# Patient Record
Sex: Female | Born: 1935 | Race: White | Hispanic: No | Smoking: Never smoker
Health system: Southern US, Community
[De-identification: ages and names within clinical notes are randomized; demographics above are authoritative.]

## PROBLEM LIST (undated history)

## (undated) DIAGNOSIS — M1990 Unspecified osteoarthritis, unspecified site: Secondary | ICD-10-CM

## (undated) DIAGNOSIS — I639 Cerebral infarction, unspecified: Secondary | ICD-10-CM

## (undated) DIAGNOSIS — N390 Urinary tract infection, site not specified: Secondary | ICD-10-CM

## (undated) DIAGNOSIS — E785 Hyperlipidemia, unspecified: Secondary | ICD-10-CM

## (undated) DIAGNOSIS — H3530 Unspecified macular degeneration: Secondary | ICD-10-CM

## (undated) DIAGNOSIS — R0600 Dyspnea, unspecified: Secondary | ICD-10-CM

## (undated) DIAGNOSIS — IMO0001 Reserved for inherently not codable concepts without codable children: Secondary | ICD-10-CM

## (undated) DIAGNOSIS — E039 Hypothyroidism, unspecified: Secondary | ICD-10-CM

## (undated) DIAGNOSIS — Z5189 Encounter for other specified aftercare: Secondary | ICD-10-CM

## (undated) DIAGNOSIS — I10 Essential (primary) hypertension: Secondary | ICD-10-CM

## (undated) DIAGNOSIS — C801 Malignant (primary) neoplasm, unspecified: Secondary | ICD-10-CM

## (undated) HISTORY — PX: OTHER SURGICAL HISTORY: SHX169

## (undated) HISTORY — DX: Malignant (primary) neoplasm, unspecified: C80.1

## (undated) HISTORY — DX: Unspecified osteoarthritis, unspecified site: M19.90

## (undated) HISTORY — DX: Hyperlipidemia, unspecified: E78.5

## (undated) HISTORY — DX: Essential (primary) hypertension: I10

## (undated) HISTORY — PX: TONSILLECTOMY: SUR1361

## (undated) HISTORY — DX: Hypothyroidism, unspecified: E03.9

---
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
## 2002-11-27 ENCOUNTER — Emergency Department (HOSPITAL_COMMUNITY): Admission: EM | Admit: 2002-11-27 | Discharge: 2002-11-28 | Payer: Self-pay | Admitting: Emergency Medicine

## 2002-11-27 ENCOUNTER — Encounter: Payer: Self-pay | Admitting: Emergency Medicine

## 2003-06-09 DIAGNOSIS — Z9189 Other specified personal risk factors, not elsewhere classified: Secondary | ICD-10-CM | POA: Insufficient documentation

## 2003-07-09 ENCOUNTER — Ambulatory Visit (HOSPITAL_COMMUNITY): Admission: RE | Admit: 2003-07-09 | Discharge: 2003-07-09 | Payer: Self-pay | Admitting: Gastroenterology

## 2003-07-09 ENCOUNTER — Encounter (INDEPENDENT_AMBULATORY_CARE_PROVIDER_SITE_OTHER): Payer: Self-pay | Admitting: Specialist

## 2005-02-06 ENCOUNTER — Encounter: Payer: Self-pay | Admitting: Family Medicine

## 2005-06-15 ENCOUNTER — Ambulatory Visit: Payer: Self-pay | Admitting: Family Medicine

## 2005-08-16 ENCOUNTER — Ambulatory Visit: Payer: Self-pay | Admitting: Family Medicine

## 2005-08-21 ENCOUNTER — Ambulatory Visit: Payer: Self-pay | Admitting: Family Medicine

## 2005-10-03 ENCOUNTER — Ambulatory Visit: Payer: Self-pay | Admitting: Family Medicine

## 2005-11-21 ENCOUNTER — Ambulatory Visit: Payer: Self-pay | Admitting: Family Medicine

## 2005-12-12 ENCOUNTER — Ambulatory Visit: Payer: Self-pay | Admitting: Family Medicine

## 2006-03-13 ENCOUNTER — Ambulatory Visit: Payer: Self-pay | Admitting: Family Medicine

## 2006-03-19 ENCOUNTER — Ambulatory Visit: Payer: Self-pay | Admitting: Family Medicine

## 2006-04-16 ENCOUNTER — Ambulatory Visit: Payer: Self-pay | Admitting: Family Medicine

## 2006-07-11 ENCOUNTER — Ambulatory Visit: Payer: Self-pay | Admitting: Family Medicine

## 2006-07-11 LAB — CONVERTED CEMR LAB: Hgb A1c MFr Bld: 6.9 %

## 2006-09-18 ENCOUNTER — Ambulatory Visit: Payer: Self-pay | Admitting: Family Medicine

## 2006-09-24 ENCOUNTER — Encounter: Payer: Self-pay | Admitting: Family Medicine

## 2006-09-24 DIAGNOSIS — I10 Essential (primary) hypertension: Secondary | ICD-10-CM | POA: Insufficient documentation

## 2006-09-24 DIAGNOSIS — IMO0002 Reserved for concepts with insufficient information to code with codable children: Secondary | ICD-10-CM | POA: Insufficient documentation

## 2006-09-24 DIAGNOSIS — E669 Obesity, unspecified: Secondary | ICD-10-CM

## 2006-09-24 DIAGNOSIS — E1149 Type 2 diabetes mellitus with other diabetic neurological complication: Secondary | ICD-10-CM | POA: Insufficient documentation

## 2006-09-24 DIAGNOSIS — N3941 Urge incontinence: Secondary | ICD-10-CM

## 2006-09-24 DIAGNOSIS — E785 Hyperlipidemia, unspecified: Secondary | ICD-10-CM

## 2006-09-24 DIAGNOSIS — L719 Rosacea, unspecified: Secondary | ICD-10-CM

## 2006-09-24 DIAGNOSIS — E1165 Type 2 diabetes mellitus with hyperglycemia: Secondary | ICD-10-CM

## 2006-09-24 DIAGNOSIS — E1140 Type 2 diabetes mellitus with diabetic neuropathy, unspecified: Secondary | ICD-10-CM

## 2006-11-08 ENCOUNTER — Encounter: Payer: Self-pay | Admitting: Family Medicine

## 2006-11-13 ENCOUNTER — Ambulatory Visit: Payer: Self-pay | Admitting: Family Medicine

## 2006-11-14 LAB — CONVERTED CEMR LAB
ALT: 17 units/L (ref 0–40)
Albumin: 3.3 g/dL — ABNORMAL LOW (ref 3.5–5.2)
BUN: 28 mg/dL — ABNORMAL HIGH (ref 6–23)
CO2: 29 meq/L (ref 19–32)
Calcium: 9.1 mg/dL (ref 8.4–10.5)
Cholesterol: 150 mg/dL (ref 0–200)
GFR calc non Af Amer: 65 mL/min
Hgb A1c MFr Bld: 8.2 % — ABNORMAL HIGH (ref 4.6–6.0)
LDL Cholesterol: 86 mg/dL (ref 0–99)
Potassium: 4.5 meq/L (ref 3.5–5.1)

## 2006-11-20 ENCOUNTER — Encounter (INDEPENDENT_AMBULATORY_CARE_PROVIDER_SITE_OTHER): Payer: Self-pay | Admitting: *Deleted

## 2006-12-11 ENCOUNTER — Ambulatory Visit: Payer: Self-pay | Admitting: Family Medicine

## 2007-02-12 ENCOUNTER — Ambulatory Visit: Payer: Self-pay | Admitting: Family Medicine

## 2007-02-14 LAB — CONVERTED CEMR LAB: ALT: 18 units/L (ref 0–35)

## 2007-03-04 ENCOUNTER — Encounter: Payer: Self-pay | Admitting: Family Medicine

## 2007-03-05 ENCOUNTER — Encounter: Payer: Self-pay | Admitting: Family Medicine

## 2007-05-14 ENCOUNTER — Ambulatory Visit: Payer: Self-pay | Admitting: Family Medicine

## 2007-05-15 LAB — CONVERTED CEMR LAB
Albumin: 3.6 g/dL (ref 3.5–5.2)
GFR calc Af Amer: 62 mL/min
GFR calc non Af Amer: 51 mL/min
Glucose, Bld: 125 mg/dL — ABNORMAL HIGH (ref 70–99)
LDL Cholesterol: 93 mg/dL (ref 0–99)
Phosphorus: 4 mg/dL (ref 2.3–4.6)
Potassium: 4.3 meq/L (ref 3.5–5.1)
Sodium: 144 meq/L (ref 135–145)
Total CHOL/HDL Ratio: 4.5
Triglycerides: 139 mg/dL (ref 0–149)
VLDL: 28 mg/dL (ref 0–40)

## 2007-06-04 ENCOUNTER — Encounter: Payer: Self-pay | Admitting: Family Medicine

## 2007-06-09 HISTORY — PX: MOHS SURGERY: SUR867

## 2007-07-01 DIAGNOSIS — C44309 Unspecified malignant neoplasm of skin of other parts of face: Secondary | ICD-10-CM | POA: Insufficient documentation

## 2007-07-01 DIAGNOSIS — C44300 Unspecified malignant neoplasm of skin of unspecified part of face: Secondary | ICD-10-CM | POA: Insufficient documentation

## 2007-08-13 ENCOUNTER — Ambulatory Visit: Payer: Self-pay | Admitting: Family Medicine

## 2007-08-13 DIAGNOSIS — L84 Corns and callosities: Secondary | ICD-10-CM

## 2007-08-14 ENCOUNTER — Encounter: Payer: Self-pay | Admitting: Family Medicine

## 2007-08-14 LAB — CONVERTED CEMR LAB
ALT: 16 units/L (ref 0–35)
AST: 24 units/L (ref 0–37)

## 2007-09-10 ENCOUNTER — Ambulatory Visit: Payer: Self-pay | Admitting: Family Medicine

## 2007-09-15 ENCOUNTER — Telehealth: Payer: Self-pay | Admitting: Family Medicine

## 2007-12-10 ENCOUNTER — Ambulatory Visit: Payer: Self-pay | Admitting: Family Medicine

## 2007-12-11 ENCOUNTER — Telehealth: Payer: Self-pay | Admitting: Family Medicine

## 2007-12-12 LAB — CONVERTED CEMR LAB
Albumin: 3.5 g/dL (ref 3.5–5.2)
Alkaline Phosphatase: 50 units/L (ref 39–117)
Basophils Absolute: 0 10*3/uL (ref 0.0–0.1)
Basophils Relative: 0.8 % (ref 0.0–1.0)
Bilirubin, Direct: 0.1 mg/dL (ref 0.0–0.3)
CO2: 29 meq/L (ref 19–32)
Chloride: 115 meq/L — ABNORMAL HIGH (ref 96–112)
Cholesterol: 133 mg/dL (ref 0–200)
Creatinine, Ser: 1.1 mg/dL (ref 0.4–1.2)
Eosinophils Absolute: 0.1 10*3/uL (ref 0.0–0.7)
Eosinophils Relative: 2 % (ref 0.0–5.0)
GFR calc Af Amer: 62 mL/min
GFR calc non Af Amer: 51 mL/min
HCT: 37.3 % (ref 36.0–46.0)
HDL: 31.4 mg/dL — ABNORMAL LOW (ref >39.0)
MCHC: 34.8 g/dL (ref 30.0–36.0)
MCV: 92.7 fL (ref 78.0–100.0)
Monocytes Absolute: 0.5 10*3/uL (ref 0.1–1.0)
Neutrophils Relative %: 46.4 % (ref 43.0–77.0)
Platelets: 167 10*3/uL (ref 150–400)
Potassium: 4.1 meq/L (ref 3.5–5.1)
TSH: 1.05 microintl units/mL (ref 0.35–5.50)
Total Protein: 6.8 g/dL (ref 6.0–8.3)
VLDL: 17 mg/dL (ref 0–40)

## 2008-02-11 ENCOUNTER — Encounter (INDEPENDENT_AMBULATORY_CARE_PROVIDER_SITE_OTHER): Payer: Self-pay | Admitting: *Deleted

## 2008-03-10 ENCOUNTER — Ambulatory Visit: Payer: Self-pay | Admitting: Family Medicine

## 2008-03-10 ENCOUNTER — Telehealth: Payer: Self-pay | Admitting: Family Medicine

## 2008-04-19 ENCOUNTER — Encounter: Payer: Self-pay | Admitting: Family Medicine

## 2008-04-23 ENCOUNTER — Telehealth (INDEPENDENT_AMBULATORY_CARE_PROVIDER_SITE_OTHER): Payer: Self-pay | Admitting: *Deleted

## 2008-05-26 ENCOUNTER — Ambulatory Visit: Payer: Self-pay | Admitting: Family Medicine

## 2008-05-27 LAB — CONVERTED CEMR LAB
ALT: 15 units/L (ref 0–35)
AST: 23 units/L (ref 0–37)
BUN: 24 mg/dL — ABNORMAL HIGH (ref 6–23)
CO2: 30 meq/L (ref 19–32)
Chloride: 112 meq/L (ref 96–112)
Cholesterol: 120 mg/dL (ref 0–200)
Creatinine, Ser: 1 mg/dL (ref 0.4–1.2)
Creatinine,U: 86.6 mg/dL
Glucose, Bld: 131 mg/dL — ABNORMAL HIGH (ref 70–99)
Hgb A1c MFr Bld: 6.7 % — ABNORMAL HIGH (ref 4.6–6.0)
Microalb Creat Ratio: 11.5 mg/g (ref 0.0–30.0)
Microalb, Ur: 1 mg/dL (ref 0.0–1.9)
Potassium: 4.2 meq/L (ref 3.5–5.1)
Triglycerides: 71 mg/dL (ref 0–149)

## 2008-06-02 ENCOUNTER — Ambulatory Visit: Payer: Self-pay | Admitting: Family Medicine

## 2008-06-02 LAB — CONVERTED CEMR LAB
Ketones, urine, test strip: NEGATIVE
RBC / HPF: 0
Urobilinogen, UA: 0.2
WBC, UA: 0 cells/hpf

## 2008-07-09 HISTORY — PX: ANKLE FRACTURE SURGERY: SHX122

## 2008-07-09 HISTORY — PX: JOINT REPLACEMENT: SHX530

## 2008-12-02 ENCOUNTER — Ambulatory Visit: Payer: Self-pay | Admitting: Family Medicine

## 2008-12-03 LAB — CONVERTED CEMR LAB
AST: 24 units/L (ref 0–37)
Albumin: 3.3 g/dL — ABNORMAL LOW (ref 3.5–5.2)
BUN: 22 mg/dL (ref 6–23)
Calcium: 8.8 mg/dL (ref 8.4–10.5)
Chloride: 110 meq/L (ref 96–112)
Cholesterol: 123 mg/dL (ref 0–200)
HDL: 32 mg/dL — ABNORMAL LOW (ref >39.00)
LDL Cholesterol: 67 mg/dL (ref 0–99)
Phosphorus: 3.9 mg/dL (ref 2.3–4.6)
VLDL: 23.8 mg/dL (ref 0.0–40.0)

## 2008-12-08 ENCOUNTER — Encounter: Admission: RE | Admit: 2008-12-08 | Discharge: 2008-12-08 | Payer: Self-pay | Admitting: Family Medicine

## 2008-12-08 ENCOUNTER — Ambulatory Visit: Payer: Self-pay | Admitting: Family Medicine

## 2008-12-08 DIAGNOSIS — N951 Menopausal and female climacteric states: Secondary | ICD-10-CM | POA: Insufficient documentation

## 2008-12-21 ENCOUNTER — Encounter: Admission: RE | Admit: 2008-12-21 | Discharge: 2008-12-21 | Payer: Self-pay | Admitting: Orthopedic Surgery

## 2008-12-24 ENCOUNTER — Encounter (INDEPENDENT_AMBULATORY_CARE_PROVIDER_SITE_OTHER): Payer: Self-pay | Admitting: *Deleted

## 2009-01-07 ENCOUNTER — Telehealth: Payer: Self-pay | Admitting: Family Medicine

## 2009-01-12 ENCOUNTER — Encounter: Payer: Self-pay | Admitting: Family Medicine

## 2009-01-25 ENCOUNTER — Encounter: Admission: RE | Admit: 2009-01-25 | Discharge: 2009-01-25 | Payer: Self-pay | Admitting: Orthopedic Surgery

## 2009-01-27 ENCOUNTER — Encounter: Payer: Self-pay | Admitting: Family Medicine

## 2009-03-15 ENCOUNTER — Encounter: Payer: Self-pay | Admitting: Family Medicine

## 2009-03-16 ENCOUNTER — Ambulatory Visit: Payer: Self-pay | Admitting: Family Medicine

## 2009-03-16 DIAGNOSIS — M169 Osteoarthritis of hip, unspecified: Secondary | ICD-10-CM | POA: Insufficient documentation

## 2009-03-16 DIAGNOSIS — M1610 Unilateral primary osteoarthritis, unspecified hip: Secondary | ICD-10-CM | POA: Insufficient documentation

## 2009-04-13 ENCOUNTER — Inpatient Hospital Stay (HOSPITAL_COMMUNITY): Admission: RE | Admit: 2009-04-13 | Discharge: 2009-04-16 | Payer: Self-pay | Admitting: Orthopedic Surgery

## 2009-04-29 ENCOUNTER — Encounter: Payer: Self-pay | Admitting: Family Medicine

## 2009-04-29 ENCOUNTER — Telehealth: Payer: Self-pay | Admitting: Family Medicine

## 2009-04-29 LAB — CONVERTED CEMR LAB
Bacteria, UA: 0
Casts: 0 /lpf
Nitrite: NEGATIVE
RBC / HPF: 0
Specific Gravity, Urine: 1.01
Urine crystals, microscopic: 0 /hpf
Urobilinogen, UA: 0.2
WBC Urine, dipstick: NEGATIVE

## 2009-04-30 ENCOUNTER — Encounter: Payer: Self-pay | Admitting: Family Medicine

## 2009-06-09 ENCOUNTER — Ambulatory Visit: Payer: Self-pay | Admitting: Family Medicine

## 2009-06-10 LAB — CONVERTED CEMR LAB
ALT: 13 units/L (ref 0–35)
AST: 22 units/L (ref 0–37)
BUN: 23 mg/dL (ref 6–23)
CO2: 28 meq/L (ref 19–32)
Calcium: 9.2 mg/dL (ref 8.4–10.5)
Chloride: 110 meq/L (ref 96–112)
Creatinine, Ser: 1 mg/dL (ref 0.4–1.2)
GFR calc non Af Amer: 56.98 mL/min (ref >60)
Hgb A1c MFr Bld: 5.9 % (ref 4.6–6.5)
LDL Cholesterol: 84 mg/dL (ref 0–99)
Microalb, Ur: 0.6 mg/dL (ref 0.0–1.9)
Total CHOL/HDL Ratio: 4

## 2009-07-06 ENCOUNTER — Emergency Department (HOSPITAL_COMMUNITY): Admission: EM | Admit: 2009-07-06 | Discharge: 2009-07-06 | Payer: Self-pay | Admitting: Emergency Medicine

## 2009-07-08 ENCOUNTER — Ambulatory Visit (HOSPITAL_COMMUNITY): Admission: RE | Admit: 2009-07-08 | Discharge: 2009-07-08 | Payer: Self-pay | Admitting: Orthopedic Surgery

## 2009-07-21 ENCOUNTER — Encounter: Payer: Self-pay | Admitting: Family Medicine

## 2009-08-12 ENCOUNTER — Ambulatory Visit: Payer: Self-pay | Admitting: Family Medicine

## 2009-08-18 ENCOUNTER — Encounter: Payer: Self-pay | Admitting: Family Medicine

## 2010-01-04 ENCOUNTER — Encounter: Payer: Self-pay | Admitting: Family Medicine

## 2010-02-09 ENCOUNTER — Ambulatory Visit: Payer: Self-pay | Admitting: Family Medicine

## 2010-02-09 LAB — CONVERTED CEMR LAB
ALT: 15 units/L (ref 0–35)
AST: 25 units/L (ref 0–37)
Albumin: 3.3 g/dL — ABNORMAL LOW (ref 3.5–5.2)
BUN: 27 mg/dL — ABNORMAL HIGH (ref 6–23)
CO2: 26 meq/L (ref 19–32)
Chloride: 109 meq/L (ref 96–112)
Creatinine, Ser: 1 mg/dL (ref 0.4–1.2)
GFR calc non Af Amer: 54.36 mL/min (ref >60)
HDL: 32 mg/dL — ABNORMAL LOW (ref >39.00)
LDL Cholesterol: 74 mg/dL (ref 0–99)
VLDL: 19.8 mg/dL (ref 0.0–40.0)

## 2010-02-15 ENCOUNTER — Ambulatory Visit: Payer: Self-pay | Admitting: Family Medicine

## 2010-02-22 ENCOUNTER — Encounter: Payer: Self-pay | Admitting: Family Medicine

## 2010-02-27 ENCOUNTER — Encounter: Payer: Self-pay | Admitting: Family Medicine

## 2010-03-30 ENCOUNTER — Telehealth: Payer: Self-pay | Admitting: Family Medicine

## 2010-04-06 ENCOUNTER — Encounter: Payer: Self-pay | Admitting: Family Medicine

## 2010-04-07 ENCOUNTER — Telehealth: Payer: Self-pay | Admitting: Family Medicine

## 2010-08-08 NOTE — Letter (Signed)
Summary: [facility]   Imported By: Lanelle Bal 01/13/2010 11:02:22  _____________________________________________________________________  External Attachment:    Type:   Image     Comment:   External Document  Appended Document: [facility]    Clinical Lists Changes  Observations: Added new observation of DMEYEEXAMNXT: 01/2011 (01/14/2010 22:48) Added new observation of DMEYEEXMRES: normal (01/04/2010 22:48) Added new observation of EYE EXAM BY: Dr Elmer Picker (01/04/2010 22:48) Added new observation of DIAB EYE EX: normal (01/04/2010 22:48)        Diabetes Management Exam:    Eye Exam:       Eye Exam done elsewhere          Date: 01/04/2010          Results: normal          Done by: Dr Elmer Picker

## 2010-08-08 NOTE — Letter (Signed)
Summary: CMN for Diabetes Supplies/[facility]  CMN for Diabetes Supplies/[facility]   Imported By: Lanelle Bal 03/06/2010 09:22:47  _____________________________________________________________________  External Attachment:    Type:   Image     Comment:   External Document

## 2010-08-08 NOTE — Letter (Signed)
Summary: [name]  [name]   Imported By: Lanelle Bal 08/23/2009 08:34:03  _____________________________________________________________________  External Attachment:    Type:   Image     Comment:   External Document

## 2010-08-08 NOTE — Assessment & Plan Note (Signed)
Summary: 6 MTH FU/CLE   Vital Signs:  Patient profile:   77 year old female Height:      61 inches Weight:      193.25 pounds BMI:     36.65 Temp:     97.3 degrees F oral Pulse rate:   64 / minute Pulse rhythm:   regular BP sitting:   140 / 72  (left arm) Cuff size:   large  Vitals Entered By: Lewanda Rife LPN (February 15, 2010 9:01 AM)  Serial Vital Signs/Assessments:  Time      Position  BP       Pulse  Resp  Temp     By                     132/72                         Judith Part MD  CC: six month f/u   History of Present Illness: here for f/u of HTN and DM and lipids  doing pretty good for the most part   bp is up a bit on first check 140/72  DM is worse with AIC of 6.8 from 5.9- but still well controlled has been under a lot of stress recently -- lost 4 friends and has one who is sick -- with a brain tumor  is eating the same  check sugars am / and pm-- and up a bit -- 130s in am and nt 100  opthy up to date   lipids better with LDL 74-- great   thyroid was stable in dec   had lost and then gained some wt (after she broke her leg)  can do a little exercise - rides bike  hard to walk with hip   does not think she needs grief counseling   Allergies: 1)  ! Metformin Hcl  Past History:  Past Medical History: Last updated: 07/01/2007 Diabetes, Type 2 Hyperlipidemia Hypertension Hypothyroidism Osteoarthritis basal cell skin cancer  Past Surgical History: Last updated: 07/01/2007 T A H and B S O Tonsillectomy 12/08 basal cell skin cancer L temple- MOHS proceedure  Family History: Last updated: 09/24/2006 Father: RA, CAD,HTN, DM Mother:  Siblings: sister- ra 3 brothers-DM 3 brothers-CAD,HTN sister- DM, CAD  Social History: Last updated: 09/24/2006 Marital Status: widowed Children: 3 Occupation:  Patient has never smoked.  Alcohol Use - no Regular Exercise - yes  Risk Factors: Exercise: yes (09/24/2006)  Risk Factors: Smoking  Status: never (09/24/2006)  Review of Systems General:  Denies fatigue, loss of appetite, and malaise. Eyes:  Denies blurring and eye irritation. CV:  Denies chest pain or discomfort, palpitations, and shortness of breath with exertion. Resp:  Denies cough, shortness of breath, and wheezing. GI:  Denies abdominal pain, change in bowel habits, indigestion, and nausea. GU:  Denies urinary frequency. MS:  Denies joint pain, joint redness, and joint swelling. Derm:  Denies itching, lesion(s), poor wound healing, and rash. Neuro:  Denies numbness and tingling. Endo:  Denies cold intolerance, excessive thirst, excessive urination, and heat intolerance. Heme:  Denies abnormal bruising and bleeding.  Physical Exam  General:  overweight but generally well appearing  Head:  normocephalic, atraumatic, and no abnormalities observed.   Eyes:  vision grossly intact, pupils equal, pupils round, and pupils reactive to light.   Mouth:  pharynx pink and moist.   Neck:  supple with full rom and no masses or thyromegally,  no JVD or carotid bruit  Lungs:  Normal respiratory effort, chest expands symmetrically. Lungs are clear to auscultation, no crackles or wheezes. Heart:  Normal rate and regular rhythm. S1 and S2 normal without gallop, murmur, click, rub or other extra sounds. Abdomen:  Bowel sounds positive,abdomen soft and non-tender without masses, organomegaly or hernias noted. no renal bruits  Msk:  No deformity or scoliosis noted of thoracic or lumbar spine.   Pulses:  R and L carotid,radial,femoral,dorsalis pedis and posterior tibial pulses are full and equal bilaterally Extremities:  No clubbing, cyanosis, edema, or deformity noted with normal full range of motion of all joints.   Neurologic:  sensation intact to light touch, gait normal, and DTRs symmetrical and normal.   Skin:  Intact without suspicious lesions or rashes Cervical Nodes:  No lymphadenopathy noted Inguinal Nodes:  No significant  adenopathy Psych:  seems anxious today good eye contact and insight   Diabetes Management Exam:    Foot Exam (with socks and/or shoes not present):       Sensory-Pinprick/Light touch:          Left medial foot (L-4): normal          Left dorsal foot (L-5): normal          Left lateral foot (S-1): normal          Right medial foot (L-4): normal          Right dorsal foot (L-5): normal          Right lateral foot (S-1): normal       Sensory-Monofilament:          Left foot: normal          Right foot: normal       Inspection:          Left foot: normal          Right foot: normal       Nails:          Left foot: normal          Right foot: normal   Impression & Recommendations:  Problem # 1:  HYPERTENSION (ICD-401.9) Assessment Unchanged  this is fair - better on 2nd check  pt is stressed today no med changes need for wt loss disc  f/u 6 mo lab reviewed in detail Her updated medication list for this problem includes:    Metoprolol Succinate 100 Mg Xr24h-tab (Metoprolol succinate) .[name]... 1 by mouth once daily    Mavik 4 Mg Tabs (Trandolapril) .[name]... 1 by mouth two times a day  BP today: 140/72-- re check 132/72 Prior BP: 130/80 (08/12/2009)  Labs Reviewed: K+: 4.9 (02/09/2010) Creat: : 1.0 (02/09/2010)   Chol: 126 (02/09/2010)   HDL: 32.00 (02/09/2010)   LDL: 74 (02/09/2010)   TG: 99.0 (02/09/2010)  Problem # 2:  HYPERLIPIDEMIA (ICD-272.4) Assessment: Improved  this is improved continue vytorin  low sat fat diet reviewed  lab and f/u 6 mo  Her updated medication list for this problem includes:    Vytorin 10-20 Mg Tabs (Ezetimibe-simvastatin) .[name]... Take one by mouth daily  Labs Reviewed: SGOT: 25 (02/09/2010)   SGPT: 15 (02/09/2010)   HDL:32.00 (02/09/2010), 36.90 (06/09/2009)  LDL:74 (02/09/2010), 84 (06/09/2009)  Chol:126 (02/09/2010), 139 (06/09/2009)  Trig:99.0 (02/09/2010), 93.0 (06/09/2009)  Problem # 3:  DIABETES, TYPE 2 (ICD-250.00) Assessment:  Deteriorated  slt worse - poss due to stress AIC still below 7 no change in med  disc healthy diet (low simple  sugar/ choose complex carbs/ low sat fat) diet and exercise in detail  lab and f/u 6 mo  Her updated medication list for this problem includes:    Mavik 4 Mg Tabs (Trandolapril) .[name]... 1 by mouth two times a day    Aspirin 81 Mg Tbec (Aspirin) .[name]... Take one by mouth daily    Actos 30 Mg Tabs (Pioglitazone hcl) .[name]... 1 by mouth once daily  Labs Reviewed: Creat: 1.0 (02/09/2010)     Last Eye Exam: normal (01/04/2010) Reviewed HgBA1c results: 6.8 (02/09/2010)  5.9 (06/09/2009)  Problem # 4:  STRESS REACTION, ACUTE, WITH EMOTIONAL DISTURBANCE (ICD-308.0) Assessment: New with multiple losses disc this in detail- overall good coping skills warned not to comfort eat offered counseling at any time if needed refilled diazepam for emergency use   Complete Medication List: 1)  Synthroid 50 Mcg Tabs (Levothyroxine sodium) .... Take one by mouth daily 2)  Metoprolol Succinate 100 Mg Xr24h-tab (Metoprolol succinate) .[name].. 1 by mouth once daily 3)  Folic Acid 400 Mcg Tabs (Folic acid) .... Take one by mouth daily 4)  Mavik 4 Mg Tabs (Trandolapril) .[name].. 1 by mouth two times a day 5)  Fish Oil 1200 Mg Caps (Omega-3 fatty acids) .... Take one by mouth twice daily 6)  Vitamin E 400 Unit Caps (Vitamin e) .... Take one by mouth every other day 7)  Vytorin 10-20 Mg Tabs (Ezetimibe-simvastatin) .... Take one by mouth daily 8)  Aspirin 81 Mg Tbec (Aspirin) .... Take one by mouth daily 9)  Actos 30 Mg Tabs (Pioglitazone hcl) .[name].. 1 by mouth once daily 10)  Diazepam 5 Mg Tabs (Diazepam) .... Take one half to one by mouth as needed at bedtime for insomnia 11)  Onetouch Ultra Test Strp (Glucose blood) .... Use to test blood sugar two times a day and prn 12)  Vicodin 5-500 Mg Tabs (Hydrocodone-acetaminophen) .... Take 1 tablet by mouth once a day as needed pain do not mix with tylenol over the  counter  Other Orders: Zoster (Shingles) Vaccine Live [id number]) Admin 1st Vaccine (60454)  Patient Instructions: 1)  no medicine changes  2)  shingles shot today  3)  work on diet and weight loss  4)  try to exercise 20 minutes a day as tolerated  5)  if you want t counseor for stress please call and let me know  6)  schedule fasting labs in 6 months lipid/ast/alt/renal / AIC/ tsh 272, 250.0 and then follow up  Prescriptions: DIAZEPAM 5 MG TABS (DIAZEPAM) take one half to one by mouth as needed at bedtime for insomnia  #15 x 0   Entered and Authorized by:   Judith Part MD   Signed by:   Judith Part MD on 02/15/2010   Method used:   Print then Give to Patient   RxID:   [id number]   Current Allergies (reviewed today): ! METFORMIN HCL   Immunizations Administered:  Zostavax # 1:    Vaccine Type: Zostavax    Site: left deltoid    Mfr: Merck    Dose: 0.5 ml    Route:     Given by: Lewanda Rife LPN    Exp. Date: 02/01/2011    Lot #: [id number]    VIS given: 04/20/05 given February 15, 2010.

## 2010-08-08 NOTE — Progress Notes (Signed)
Summary: approved for pt assistance for actos  Phone Note Other Incoming   Caller: [employer]  Summary of Call: Approval letter received for pt's enrollment in [employer]'s pt assistance program, for actos.  Letter is on your shelf. Initial call taken by: Lowella Petties CMA,  April 07, 2010 8:58 AM

## 2010-08-08 NOTE — Progress Notes (Signed)
Summary: Patient assistance app for Actos  Phone Note Refill Request Call back at [phone] Message from:  Patient on March 30, 2010 10:22 AM  Refills Requested: Medication #1:  ACTOS 30 MG  TABS 1 by mouth once daily Pt came by and brought a patient assistance program app for Actos to be signed by Dr Milinda Antis with an attached rx for Actos. Pt can be reached at [phone] when ready for pick up.  application is on your shelf in your in box.   Method Requested: Pick up at Office Initial call taken by: Lewanda Rife LPN,  March 30, 2010 10:25 AM  Follow-up for Phone Call        form done and in nurse in box  Follow-up by: Judith Part MD,  March 30, 2010 12:44 PM  Additional Follow-up for Phone Call Additional follow up Details #1::        Forms copied and originals mailed along with script and pt's financial information to [employer]. Additional Follow-up by: Lowella Petties CMA,  March 31, 2010 9:44 AM    New/Updated Medications: ACTOS 30 MG  TABS (PIOGLITAZONE HCL) 1 by mouth once daily Prescriptions: ACTOS 30 MG  TABS (PIOGLITAZONE HCL) 1 by mouth once daily  #90 x 3   Entered and Authorized by:   Judith Part MD   Signed by:   Judith Part MD on 03/30/2010   Method used:   Print then Give to Patient   RxID:   [id number]

## 2010-08-08 NOTE — Assessment & Plan Note (Signed)
Summary: [facility] APPT FROM 07-27-2009-[facility] HER LEG  CYD   Vital Signs:  Patient profile:   77 year old female Weight:      97.4 pounds Temp:     97.4 degrees F oral Pulse rate:   68 / minute Pulse rhythm:   regular BP sitting:   130 / 80  (left arm) Cuff size:   regular  Vitals Entered By: Lowella Petties CMA (August 12, 2009 4:07 PM) CC: follow-up visit   History of Present Illness: her for f/u of DM and lipids   had leg fx since last visit  slipped going out to the mailbox -- and her foot twisted under a pc of lumber  surgery - plate and 6 screw   otherwise is doing pretty good  getting a lot of help at home  cast should come off next week   lipids up a bit with trig 93/ HDL 36 and LDL 84 (up from the 60s)  AIC is 5.9 -- this is imp from 6.7 this came down some is really watching her diet - family is bringing healthy food  is doing some arm and leg exercises   bp stable   is taking vitamin D , but not calcium  ortho told her her bones are very strong   last opthy appt -due in nov could not go      Allergies: 1)  ! Metformin Hcl  Past History:  Past Medical History: Last updated: 07/01/2007 Diabetes, Type 2 Hyperlipidemia Hypertension Hypothyroidism Osteoarthritis basal cell skin cancer  Past Surgical History: Last updated: 07/01/2007 T A H and B S O Tonsillectomy 12/08 basal cell skin cancer L temple- MOHS proceedure  Family History: Last updated: 09/24/2006 Father: RA, CAD,HTN, DM Mother:  Siblings: sister- ra 3 brothers-DM 3 brothers-CAD,HTN sister- DM, CAD  Social History: Last updated: 09/24/2006 Marital Status: widowed Children: 3 Occupation:  Patient has never smoked.  Alcohol Use - no Regular Exercise - yes  Risk Factors: Exercise: yes (09/24/2006)  Risk Factors: Smoking Status: never (09/24/2006)  Review of Systems General:  Denies fatigue, fever, loss of appetite, and malaise. Eyes:  Denies blurring and eye  irritation. CV:  Denies chest pain or discomfort, lightheadness, and palpitations. Resp:  Denies cough and wheezing. GI:  Denies abdominal pain, change in bowel habits, indigestion, and nausea. GU:  Denies urinary frequency. MS:  Complains of joint pain; denies cramps and muscle weakness. Derm:  Denies lesion(s), poor wound healing, and rash. Neuro:  Denies numbness and tingling. Psych:  Denies anxiety and depression. Endo:  Denies excessive thirst and excessive urination. Heme:  Denies abnormal bruising and bleeding.  Physical Exam  General:  overweight but generally well appearing  Head:  normocephalic, atraumatic, and no abnormalities observed.   Eyes:  vision grossly intact, pupils equal, pupils round, and pupils reactive to light.   Nose:  no nasal discharge.   Mouth:  pharynx pink and moist.   Neck:  supple with full rom and no masses or thyromegally, no JVD or carotid bruit  Chest Wall:  No deformities, masses, or tenderness noted. Lungs:  Normal respiratory effort, chest expands symmetrically. Lungs are clear to auscultation, no crackles or wheezes. Heart:  Normal rate and regular rhythm. S1 and S2 normal without gallop, murmur, click, rub or other extra sounds. Msk:  No deformity or scoliosis noted of thoracic or lumbar spine.   leg is in cast  Pulses:  R and L carotid,radial,femoral,dorsalis pedis and posterior tibial pulses  are full and equal bilaterally Extremities:  No clubbing, cyanosis, edema, or deformity noted with normal full range of motion of all joints.   Neurologic:  sensation intact to light touch, gait normal, and DTRs symmetrical and normal.   Skin:  Intact without suspicious lesions or rashes Cervical Nodes:  No lymphadenopathy noted Psych:  normal affect, talkative and pleasant   Diabetes Management Exam:    Foot Exam (with socks and/or shoes not present):       Sensory-Pinprick/Light touch:          Left medial foot (L-4): normal          Left dorsal  foot (L-5): normal          Left lateral foot (S-1): normal          Right medial foot (L-4): normal          Right dorsal foot (L-5): normal          Right lateral foot (S-1): normal       Sensory-Monofilament:          Left foot: normal          Right foot: normal       Inspection:          Left foot: normal          Right foot: normal       Nails:          Left foot: normal          Right foot: normal   Impression & Recommendations:  Problem # 1:  HYPERTENSION (ICD-401.9) Assessment Unchanged  bp is stable on current med without change will gradually get back to regular activity f/u 6 mo  Her updated medication list for this problem includes:    Metoprolol Succinate 100 Mg Xr24h-tab (Metoprolol succinate) .[name]... 1 by mouth once daily    Mavik 4 Mg Tabs (Trandolapril) .[name]... 1 by mouth two times a day  BP today: 130/80 Prior BP: 116/80 (03/16/2009)  Labs Reviewed: K+: 4.3 (06/09/2009) Creat: : 1.0 (06/09/2009)   Chol: 139 (06/09/2009)   HDL: 36.90 (06/09/2009)   LDL: 84 (06/09/2009)   TG: 93.0 (06/09/2009)  Orders: Prescription Created Electronically [phone])  Problem # 2:  HYPERLIPIDEMIA (ICD-272.4) Assessment: Deteriorated  good control but not quite to goal  will work on lower sat fat diet  re check 6 mo and f/u Her updated medication list for this problem includes:    Vytorin 10-20 Mg Tabs (Ezetimibe-simvastatin) .[name]... Take one by mouth daily  Labs Reviewed: SGOT: 22 (06/09/2009)   SGPT: 13 (06/09/2009)   HDL:36.90 (06/09/2009), 32.00 (12/02/2008)  LDL:84 (06/09/2009), 67 (12/02/2008)  Chol:139 (06/09/2009), 123 (12/02/2008)  Trig:93.0 (06/09/2009), 119.0 (12/02/2008)  Problem # 3:  DIABETES, TYPE 2 (ICD-250.00) Assessment: Improved  very good control on actos and diet  will work on wt loss sched opthy when she is mobile to go  f/u 6 mo  Her updated medication list for this problem includes:    Mavik 4 Mg Tabs (Trandolapril) .[name]... 1 by mouth two times a  day    Aspirin 81 Mg Tbec (Aspirin) .[name]... Take one by mouth daily    Actos 30 Mg Tabs (Pioglitazone hcl) .[name]... 1 by mouth once daily  Labs Reviewed: Creat: 1.0 (06/09/2009)     Last Eye Exam: normal (03/10/2008) Reviewed HgBA1c results: 5.9 (06/09/2009)  6.7 (12/02/2008)  Complete Medication List: 1)  Synthroid 50 Mcg Tabs (Levothyroxine sodium) .... Take one by mouth  daily 2)  Metoprolol Succinate 100 Mg Xr24h-tab (Metoprolol succinate) .[name].. 1 by mouth once daily 3)  Folic Acid 400 Mcg Tabs (Folic acid) .... Take one by mouth daily 4)  Mavik 4 Mg Tabs (Trandolapril) .[name].. 1 by mouth two times a day 5)  Fish Oil 1200 Mg Caps (Omega-3 fatty acids) .... Take one by mouth bid 6)  Vitamin E 400 Unit Caps (Vitamin e) .... Take one by mouth qod 7)  Vytorin 10-20 Mg Tabs (Ezetimibe-simvastatin) .... Take one by mouth daily 8)  Aspirin 81 Mg Tbec (Aspirin) .... Take one by mouth daily 9)  Actos 30 Mg Tabs (Pioglitazone hcl) .[name].. 1 by mouth once daily 10)  Diazepam 5 Mg Tabs (Diazepam) .... Take one half to one by mouth as needed at bedtime for insomnia 11)  Onetouch Ultra Test Strp (Glucose blood) .... Use to test blood sugar two times a day and prn 12)  Vicodin 5-500 Mg Tabs (Hydrocodone-acetaminophen) .... Take 1 tablet by mouth once a day as needed pain do not mix with tylenol over the counter  Patient Instructions: 1)  the current recommendation for calcium intake is 1200-1500 mg daily with-1000 IU of vitamin D  2)  make sure to go to yearly eye exam when you are more mobile  3)  watch diet best you can for fats and sugars  4)  gradually get back to normal activity  5)  schedule fasting labs and follow up in 6 months lipid/ast/alt/AIC/renal 250.0, 272  Prescriptions: ACTOS 30 MG  TABS (PIOGLITAZONE HCL) 1 by mouth once daily  #90 x 3   Entered and Authorized by:   Judith Part MD   Signed by:   Judith Part MD on 08/12/2009   Method used:   Electronically to        [facility]  [address] [id number] (retail)       [address]       [place], [place]  [address]       Ph: [phone]       Fax: [phone]   RxID:   [id number] VYTORIN 10-20 MG TABS (EZETIMIBE-SIMVASTATIN) take one by mouth daily  #30 x 11   Entered and Authorized by:   Judith Part MD   Signed by:   Judith Part MD on 08/12/2009   Method used:   [facility]ically to        [facility]  [address] [id number] (retail)       [address]       [place], [place]  [address]       Ph: [phone]       Fax: [phone]   RxID:   [id number] SYNTHROID 50 MCG TABS (LEVOTHYROXINE SODIUM) take one by mouth daily  #30 x 11   Entered and Authorized by:   Judith Part MD   Signed by:   Judith Part MD on 08/12/2009   Method used:   [facility] to        [facility]  [address] [id number] ([facility]       [address]       [place], [place]  [address]       Ph: [phone]       Fax: [phone]   RxID:   [id number]   Prior Medications (reviewed today): SYNTHROID  50 MCG TABS (LEVOTHYROXINE SODIUM) take one by mouth daily METOPROLOL SUCCINATE 100 MG XR24H-TAB (METOPROLOL SUCCINATE) 1 by mouth once daily FOLIC ACID 400 MCG TABS (FOLIC ACID) take one by mouth daily MAVIK 4 MG TABS (TRANDOLAPRIL) 1 by mouth two times a day FISH OIL 1200 MG CAPS (OMEGA-3 FATTY ACIDS) take one by mouth bid VITAMIN E 400 UNIT CAPS (VITAMIN E) take one by mouth qod VYTORIN 10-20 MG TABS (EZETIMIBE-SIMVASTATIN) take one by mouth daily ASPIRIN 81 MG TBEC (ASPIRIN) take one by mouth daily ACTOS 30 MG  TABS (PIOGLITAZONE HCL) 1 by mouth once daily DIAZEPAM 5 MG TABS (DIAZEPAM) take one half to one by mouth as needed at bedtime for insomnia ONETOUCH ULTRA TEST   STRP (GLUCOSE BLOOD) use to test blood sugar two times a day and prn VICODIN 5-500 MG TABS (HYDROCODONE-ACETAMINOPHEN) Take 1 tablet by mouth once a day as needed pain do not mix with tylenol over  the counter Current Allergies: ! METFORMIN HCL

## 2010-08-08 NOTE — Letter (Signed)
Summary: [facility]  [facility]   Imported By: Lanelle Bal 07/28/2009 08:33:03  _____________________________________________________________________  External Attachment:    Type:   Image     Comment:   External Document

## 2010-08-08 NOTE — Miscellaneous (Signed)
Summary: CONTROLLED SUBSTANCES CONTRACT  [employer]ED [employer] CONTRACT   Imported By: Wilder Glade 02/22/2010 12:52:50  _____________________________________________________________________  External Attachment:    Type:   Image     Comment:   External Document

## 2010-08-08 NOTE — Medication Information (Signed)
Summary: Approval for Patient Assistance Program/[employer]  Approval for Patient Assistance Program/[employer]   Imported By: Lanelle Bal 04/17/2010 08:19:30  _____________________________________________________________________  External Attachment:    Type:   Image     Comment:   External Document

## 2010-08-17 ENCOUNTER — Encounter (INDEPENDENT_AMBULATORY_CARE_PROVIDER_SITE_OTHER): Payer: Self-pay | Admitting: *Deleted

## 2010-08-17 ENCOUNTER — Other Ambulatory Visit (INDEPENDENT_AMBULATORY_CARE_PROVIDER_SITE_OTHER): Payer: MEDICARE

## 2010-08-17 ENCOUNTER — Other Ambulatory Visit: Payer: Self-pay | Admitting: Family Medicine

## 2010-08-17 DIAGNOSIS — E785 Hyperlipidemia, unspecified: Secondary | ICD-10-CM

## 2010-08-17 DIAGNOSIS — I10 Essential (primary) hypertension: Secondary | ICD-10-CM

## 2010-08-17 DIAGNOSIS — E119 Type 2 diabetes mellitus without complications: Secondary | ICD-10-CM

## 2010-08-17 LAB — RENAL FUNCTION PANEL
Albumin: 3.4 g/dL — ABNORMAL LOW (ref 3.5–5.2)
BUN: 28 mg/dL — ABNORMAL HIGH (ref 6–23)
Creatinine, Ser: 1.1 mg/dL (ref 0.4–1.2)
GFR: 51.43 mL/min — ABNORMAL LOW (ref >60.00)
Glucose, Bld: 137 mg/dL — ABNORMAL HIGH (ref 70–99)
Phosphorus: 3.9 mg/dL (ref 2.3–4.6)

## 2010-08-17 LAB — LIPID PANEL
HDL: 33.2 mg/dL — ABNORMAL LOW (ref >39.00)
Total CHOL/HDL Ratio: 8
Triglycerides: 190 mg/dL — ABNORMAL HIGH (ref 0.0–149.0)

## 2010-08-17 LAB — AST: AST: 23 U/L (ref 0–37)

## 2010-08-17 LAB — ALT: ALT: 15 U/L (ref 0–35)

## 2010-08-23 ENCOUNTER — Encounter: Payer: Self-pay | Admitting: Family Medicine

## 2010-08-23 ENCOUNTER — Ambulatory Visit (INDEPENDENT_AMBULATORY_CARE_PROVIDER_SITE_OTHER): Payer: MEDICARE | Admitting: Family Medicine

## 2010-08-23 DIAGNOSIS — R0982 Postnasal drip: Secondary | ICD-10-CM | POA: Insufficient documentation

## 2010-08-23 DIAGNOSIS — M545 Low back pain: Secondary | ICD-10-CM

## 2010-08-23 DIAGNOSIS — I10 Essential (primary) hypertension: Secondary | ICD-10-CM

## 2010-08-23 DIAGNOSIS — E119 Type 2 diabetes mellitus without complications: Secondary | ICD-10-CM

## 2010-08-23 DIAGNOSIS — E785 Hyperlipidemia, unspecified: Secondary | ICD-10-CM

## 2010-09-05 NOTE — Assessment & Plan Note (Signed)
Summary: FOLLOW-UP   Vital Signs:  Patient profile:   77 year old female Height:      61 inches Weight:      198.75 pounds BMI:     37.69 Temp:     97.7 degrees F oral Pulse rate:   60 / minute Pulse rhythm:   regular BP sitting:   140 / 72  (left arm) Cuff size:   large  Vitals Entered By: Lewanda Rife LPN (August 23, 2010 9:06 AM) CC: six month f/u   History of Present Illness: here for f/u of HTN and lipids and DM   lipids are way up with trig 190 and HDL 33 and LDL 202 ! med-- could not afford so stopped it  is trying to work on diet  eats a lot of greens  eating more sugar than she should have -- holidays  eating more chiken  red meat once every 2 weeks  some fried foods    has not been riding bike due to back pain  does other calesthenic exericses  has not been to a back doctor   bun is high -is trying to drink more water- causes frequent urination   DM on actos - AIC up to 6.9 from 6.8  HTN - 140/72 first check   wt is up 5 lb with bmi of 37  Allergies: 1)  ! Metformin Hcl 2)  Crestor  Past History:  Past Medical History: Last updated: 07/01/2007 Diabetes, Type 2 Hyperlipidemia Hypertension Hypothyroidism Osteoarthritis basal cell skin cancer  Past Surgical History: Last updated: 07/01/2007 T A H and B S O Tonsillectomy 12/08 basal cell skin cancer L temple- MOHS proceedure  Family History: Last updated: 09/24/2006 Father: RA, CAD,HTN, DM Mother:  Siblings: sister- ra 3 brothers-DM 3 brothers-CAD,HTN sister- DM, CAD  Social History: Last updated: 09/24/2006 Marital Status: widowed Children: 3 Occupation:  Patient has never smoked.  Alcohol Use - no Regular Exercise - yes  Risk Factors: Exercise: yes (09/24/2006)  Risk Factors: Smoking Status: never (09/24/2006)  Review of Systems General:  Complains of fatigue; denies loss of appetite and malaise. Eyes:  Denies blurring and eye irritation. CV:  Denies chest pain  or discomfort, lightheadness, and palpitations. Resp:  Denies cough, shortness of breath, and wheezing. GI:  Denies abdominal pain, change in bowel habits, and nausea. GU:  Denies dysuria and urinary frequency. MS:  Complains of low back pain and mid back pain; denies cramps and muscle weakness. Derm:  Denies itching, lesion(s), poor wound healing, and rash. Neuro:  Denies numbness and tingling. Psych:  Denies anxiety and depression. Endo:  Denies cold intolerance, excessive thirst, excessive urination, and heat intolerance. Heme:  Denies abnormal bruising and bleeding.  Physical Exam  General:  overweight but generally well appearing  Head:  normocephalic, atraumatic, and no abnormalities observed.  no conjunctival pallor, injection or icterus  Eyes:  vision grossly intact, pupils equal, pupils round, pupils reactive to light, and no injection.   Ears:  R ear normal and L ear normal.   Nose:  nares are boggy with clear rhinorrhea Mouth:  pharynx pink and moist, no erythema, and no exudates.  some clear post nasal drip Neck:  supple with full rom and no masses or thyromegally, no JVD or carotid bruit  Chest Wall:  No deformities, masses, or tenderness noted. Lungs:  Normal respiratory effort, chest expands symmetrically. Lungs are clear to auscultation, no crackles or wheezes. Heart:  Normal rate and regular rhythm. S1  and S2 normal without gallop, murmur, click, rub or other extra sounds. Abdomen:  soft, non-tender, and normal bowel sounds.  no renal bruits  Msk:  No deformity or scoliosis noted of thoracic or lumbar spine.   Pulses:  R and L carotid,radial,femoral,dorsalis pedis and posterior tibial pulses are full and equal bilaterally Extremities:  No clubbing, cyanosis, edema, or deformity noted with normal full range of motion of all joints.   Neurologic:  sensation intact to light touch, gait normal, and DTRs symmetrical and normal.   Skin:  Intact without suspicious lesions or  rashes Cervical Nodes:  No lymphadenopathy noted Inguinal Nodes:  No significant adenopathy Psych:  normal affect, talkative and pleasant   Diabetes Management Exam:    Foot Exam (with socks and/or shoes not present):       Sensory-Pinprick/Light touch:          Left medial foot (L-4): normal          Left dorsal foot (L-5): normal          Left lateral foot (S-1): normal          Right medial foot (L-4): normal          Right dorsal foot (L-5): normal          Right lateral foot (S-1): normal       Sensory-Monofilament:          Left foot: normal          Right foot: normal       Inspection:          Left foot: normal          Right foot: normal       Nails:          Left foot: normal          Right foot: normal   Impression & Recommendations:  Problem # 1:  BACK PAIN, LUMBAR (ICD-724.2) Assessment Deteriorated ongoing and really limiting her activities ref to ortho Her updated medication list for this problem includes:    Aspirin 81 Mg Tbec (Aspirin) .[name]... Take one by mouth daily    Vicodin 5-500 Mg Tabs (Hydrocodone-acetaminophen) .[name]... Take 1 tablet by mouth once a day as needed pain do not mix with tylenol over the counter  Orders: Orthopedic Referral (Ortho)  Problem # 2:  HYPERTENSION (ICD-401.9) Assessment: Deteriorated  up a bit today will work on exercise  re check 3 mo - if not imp will adj med  Her updated medication list for this problem includes:    Metoprolol Succinate 100 Mg Xr24h-tab (Metoprolol succinate) .[name]... 1 by mouth once daily    Mavik 4 Mg Tabs (Trandolapril) .[name]... 1 by mouth two times a day  Orders: Prescription Created Electronically [phone])  Problem # 3:  POSTNASAL DRIP (ICD-784.91) Assessment: New with sore throat for a month trial of claritin and update  Problem # 4:  HYPERLIPIDEMIA (ICD-272.4) Assessment: Deteriorated  much worse off med- cannot afford vytorin  will change to zocor and zetia- will let me know if that is more  affordible lab and f/u 3 mo  The following medications were removed from the medication list:    Vytorin 10-20 Mg Tabs (Ezetimibe-simvastatin) .[name]... Take one by mouth daily Her updated medication list for this problem includes:    Zocor 20 Mg Tabs (Simvastatin) .[name]... 1 by mouth once daily    Zetia 10 Mg Tabs (Ezetimibe) .[name]... 1 by mouth once daily  Labs Reviewed:  SGOT: 23 (08/17/2010)   SGPT: 15 (08/17/2010)   HDL:33.20 (08/17/2010), 32.00 (02/09/2010)  LDL:74 (02/09/2010), 84 (06/09/2009)  Chol:272 (08/17/2010), 126 (02/09/2010)  Trig:190.0 (08/17/2010), 99.0 (02/09/2010)  Orders: Prescription Created Electronically [phone])  Problem # 5:  DIABETES, TYPE 2 (ICD-250.00) Assessment: Deteriorated  a little worse long disc about need for wt loss and better diet  rev labs with pt in detail lab and f/u 3 mo  Her updated medication list for this problem includes:    Mavik 4 Mg Tabs (Trandolapril) .[name]... 1 by mouth two times a day    Aspirin 81 Mg Tbec (Aspirin) .[name]... Take one by mouth daily    Actos 30 Mg Tabs (Pioglitazone hcl) .[name]... 1 by mouth once daily  Labs Reviewed: Creat: 1.1 (08/17/2010)     Last Eye Exam: normal (01/04/2010) Reviewed HgBA1c results: 6.9 (08/17/2010)  6.8 (02/09/2010)  Orders: Prescription Created Electronically [phone])  Complete Medication List: 1)  Synthroid 50 Mcg Tabs (Levothyroxine sodium) .... Take one by mouth daily 2)  Metoprolol Succinate 100 Mg Xr24h-tab (Metoprolol succinate) .[name].. 1 by mouth once daily 3)  Folic Acid 400 Mcg Tabs (Folic acid) .... Take one by mouth daily 4)  Mavik 4 Mg Tabs (Trandolapril) .[name].. 1 by mouth two times a day 5)  Fish Oil 1200 Mg Caps (Omega-3 fatty acids) .... Take one by mouth twice daily 6)  Vitamin E 400 Unit Caps (Vitamin e) .... Take one by mouth every other day 7)  Aspirin 81 Mg Tbec (Aspirin) .... Take one by mouth daily 8)  Actos 30 Mg Tabs (Pioglitazone hcl) .[name].. 1 by mouth once daily 9)  Diazepam 5 Mg  Tabs (Diazepam) .... Take one half to one by mouth as needed at bedtime for insomnia 10)  Onetouch Ultra Test Strp (Glucose blood) .... Use to test blood sugar two times a day and prn 11)  Vicodin 5-500 Mg Tabs (Hydrocodone-acetaminophen) .... Take 1 tablet by mouth once a day as needed pain do not mix with tylenol over the counter 12)  Zocor 20 Mg Tabs (Simvastatin) .[name].. 1 by mouth once daily 13)  Zetia 10 Mg Tabs (Ezetimibe) .[name].. 1 by mouth once daily  Patient Instructions: 1)  I am going to change your vytorin to generic zocor and zetia (? if zetia is generic yet)  2)  if this is not affordible - call insurance and find out what cholesterol medicines are affordable  3)  we will refer you to orthopedics for your back  4)  watch diet- no more sweets or fried foods and try decreasing portions 5)  goal -- to find an exercise that does not hurt your back  6)  try claritin over the counter once daily 10 mg for post nasal drip for sore throat  7)  schedule fasting labs in 3 months and then folllow up lipid/ast/ast/ renal / AIC /tsh  Prescriptions: SYNTHROID 50 MCG TABS (LEVOTHYROXINE SODIUM) take one by mouth daily  #30 x 11   Entered and Authorized by:   Judith Part MD   Signed by:   Judith Part MD on 08/23/2010   Method used:   Electronically to        [id number] ([facility])       [address]       [place], [place]  [address]       Ph: [phone]       Fax:  [phone]   RxID:   [id number] ZETIA 10 MG TABS (EZETIMIBE) 1 by mouth once daily  #30 x 11   Entered and Authorized by:   Judith Part MD   Signed by:   Judith Part MD on 08/23/2010   Method used:   Electronically to        [id number] ([facility]       [address]       [place], [place]  [address]       Ph: [phone]       Fax: [phone]   RxID:   [id number] ZOCOR 20 MG TABS (SIMVASTATIN) 1 by mouth once daily  #30 x 11    Entered and Authorized by:   Judith Part MD   Signed by:   Judith Part MD on 08/23/2010   Method used:   Electronically to        [id number] ([facility]       [address]       [place], [place]  [address]       Ph: [phone]       Fax: [phone]   RxID:   [id number]    Orders Added: 1)  Orthopedic Referral [Ortho] 2)  Prescription Created Electronically [[id number]] 3)  Est. Patient Level IV [51761]    Current Allergies (reviewed today): ! METFORMIN HCL CRESTOR

## 2010-10-09 LAB — CBC
HCT: 34.7 % — ABNORMAL LOW (ref 36.0–46.0)
Hemoglobin: 11.9 g/dL — ABNORMAL LOW (ref 12.0–15.0)
MCHC: 34.4 g/dL (ref 30.0–36.0)
MCV: 96.2 fL (ref 78.0–100.0)
RDW: 15.6 % — ABNORMAL HIGH (ref 11.5–15.5)

## 2010-10-09 LAB — BASIC METABOLIC PANEL
BUN: 14 mg/dL (ref 6–23)
CO2: 27 mEq/L (ref 19–32)
Chloride: 108 mEq/L (ref 96–112)
GFR calc non Af Amer: >60 mL/min (ref >60)
Glucose, Bld: 144 mg/dL — ABNORMAL HIGH (ref 70–99)
Potassium: 4.1 mEq/L (ref 3.5–5.1)
Sodium: 141 mEq/L (ref 135–145)

## 2010-10-09 LAB — GLUCOSE, CAPILLARY

## 2010-10-12 LAB — BASIC METABOLIC PANEL
BUN: 18 mg/dL (ref 6–23)
CO2: 24 mEq/L (ref 19–32)
Calcium: 8 mg/dL — ABNORMAL LOW (ref 8.4–10.5)
Chloride: 105 mEq/L (ref 96–112)
Chloride: 107 mEq/L (ref 96–112)
Creatinine, Ser: 0.83 mg/dL (ref 0.4–1.2)
Creatinine, Ser: 1.06 mg/dL (ref 0.4–1.2)
GFR calc Af Amer: >60 mL/min (ref >60)
GFR calc non Af Amer: 50 mL/min — ABNORMAL LOW (ref >60)
GFR calc non Af Amer: 56 mL/min — ABNORMAL LOW (ref >60)
Glucose, Bld: 121 mg/dL — ABNORMAL HIGH (ref 70–99)
Glucose, Bld: 131 mg/dL — ABNORMAL HIGH (ref 70–99)
Potassium: 3.6 mEq/L (ref 3.5–5.1)
Potassium: 4.7 mEq/L (ref 3.5–5.1)

## 2010-10-12 LAB — TYPE AND SCREEN: Antibody Screen: NEGATIVE

## 2010-10-12 LAB — GLUCOSE, CAPILLARY
Glucose-Capillary: 126 mg/dL — ABNORMAL HIGH (ref 70–99)
Glucose-Capillary: 131 mg/dL — ABNORMAL HIGH (ref 70–99)
Glucose-Capillary: 132 mg/dL — ABNORMAL HIGH (ref 70–99)
Glucose-Capillary: 147 mg/dL — ABNORMAL HIGH (ref 70–99)
Glucose-Capillary: 154 mg/dL — ABNORMAL HIGH (ref 70–99)

## 2010-10-12 LAB — CBC
HCT: 27.8 % — ABNORMAL LOW (ref 36.0–46.0)
MCHC: 33.8 g/dL (ref 30.0–36.0)
MCHC: 34.5 g/dL (ref 30.0–36.0)
MCV: 95.1 fL (ref 78.0–100.0)
MCV: 97.3 fL (ref 78.0–100.0)
MCV: 97.5 fL (ref 78.0–100.0)
Platelets: 155 10*3/uL (ref 150–400)
Platelets: 155 10*3/uL (ref 150–400)
RBC: 3.7 MIL/uL — ABNORMAL LOW (ref 3.87–5.11)
RDW: 15.2 % (ref 11.5–15.5)
RDW: 15.3 % (ref 11.5–15.5)
WBC: 11.6 10*3/uL — ABNORMAL HIGH (ref 4.0–10.5)

## 2010-10-12 LAB — HEMOGLOBIN AND HEMATOCRIT, BLOOD
HCT: 28.5 % — ABNORMAL LOW (ref 36.0–46.0)
Hemoglobin: 9.9 g/dL — ABNORMAL LOW (ref 12.0–15.0)

## 2010-10-12 LAB — ABO/RH: ABO/RH(D): O POS

## 2010-10-12 LAB — PROTIME-INR
INR: 2.1 — ABNORMAL HIGH (ref 0.00–1.49)
Prothrombin Time: 23.3 seconds — ABNORMAL HIGH (ref 11.6–15.2)
Prothrombin Time: 23.4 seconds — ABNORMAL HIGH (ref 11.6–15.2)

## 2010-10-13 LAB — CBC
HCT: 38.7 % (ref 36.0–46.0)
MCHC: 34 g/dL (ref 30.0–36.0)
MCV: 96.4 fL (ref 78.0–100.0)
Platelets: 199 10*3/uL (ref 150–400)
WBC: 9.7 10*3/uL (ref 4.0–10.5)

## 2010-10-13 LAB — URINALYSIS, ROUTINE W REFLEX MICROSCOPIC
Bilirubin Urine: NEGATIVE
Ketones, ur: NEGATIVE mg/dL
Nitrite: NEGATIVE
Protein, ur: NEGATIVE mg/dL
Urobilinogen, UA: 0.2 mg/dL (ref 0.0–1.0)

## 2010-10-13 LAB — COMPREHENSIVE METABOLIC PANEL
BUN: 22 mg/dL (ref 6–23)
CO2: 30 mEq/L (ref 19–32)
Calcium: 9.6 mg/dL (ref 8.4–10.5)
Chloride: 105 mEq/L (ref 96–112)
Creatinine, Ser: 0.86 mg/dL (ref 0.4–1.2)
GFR calc non Af Amer: >60 mL/min (ref >60)
Total Bilirubin: 0.7 mg/dL (ref 0.3–1.2)

## 2010-10-13 LAB — APTT: aPTT: 26 seconds (ref 24–37)

## 2010-10-13 LAB — PROTIME-INR: INR: 1 (ref 0.00–1.49)

## 2010-10-16 ENCOUNTER — Other Ambulatory Visit: Payer: Self-pay | Admitting: Family Medicine

## 2010-11-04 ENCOUNTER — Encounter: Payer: Self-pay | Admitting: Family Medicine

## 2010-11-14 ENCOUNTER — Other Ambulatory Visit: Payer: Self-pay | Admitting: Family Medicine

## 2010-11-14 DIAGNOSIS — I10 Essential (primary) hypertension: Secondary | ICD-10-CM

## 2010-11-14 DIAGNOSIS — E7800 Pure hypercholesterolemia, unspecified: Secondary | ICD-10-CM

## 2010-11-16 ENCOUNTER — Other Ambulatory Visit (INDEPENDENT_AMBULATORY_CARE_PROVIDER_SITE_OTHER): Payer: Medicare Other

## 2010-11-16 DIAGNOSIS — E119 Type 2 diabetes mellitus without complications: Secondary | ICD-10-CM

## 2010-11-16 DIAGNOSIS — I10 Essential (primary) hypertension: Secondary | ICD-10-CM

## 2010-11-16 DIAGNOSIS — E7800 Pure hypercholesterolemia, unspecified: Secondary | ICD-10-CM

## 2010-11-16 LAB — RENAL FUNCTION PANEL
Albumin: 3.4 g/dL — ABNORMAL LOW (ref 3.5–5.2)
BUN: 29 mg/dL — ABNORMAL HIGH (ref 6–23)
Calcium: 9.2 mg/dL (ref 8.4–10.5)
Creatinine, Ser: 1.1 mg/dL (ref 0.4–1.2)
Glucose, Bld: 125 mg/dL — ABNORMAL HIGH (ref 70–99)
Potassium: 4.5 mEq/L (ref 3.5–5.1)

## 2010-11-16 LAB — LIPID PANEL
HDL: 33.8 mg/dL — ABNORMAL LOW (ref >39.00)
LDL Cholesterol: 64 mg/dL (ref 0–99)
Total CHOL/HDL Ratio: 3
Triglycerides: 89 mg/dL (ref 0.0–149.0)
VLDL: 17.8 mg/dL (ref 0.0–40.0)

## 2010-11-22 ENCOUNTER — Encounter: Payer: Self-pay | Admitting: Family Medicine

## 2010-11-22 ENCOUNTER — Ambulatory Visit (INDEPENDENT_AMBULATORY_CARE_PROVIDER_SITE_OTHER): Payer: Medicare Other | Admitting: Family Medicine

## 2010-11-22 DIAGNOSIS — E785 Hyperlipidemia, unspecified: Secondary | ICD-10-CM

## 2010-11-22 DIAGNOSIS — E119 Type 2 diabetes mellitus without complications: Secondary | ICD-10-CM

## 2010-11-22 DIAGNOSIS — I10 Essential (primary) hypertension: Secondary | ICD-10-CM

## 2010-11-22 MED ORDER — TRANDOLAPRIL 4 MG PO TABS: 4.0000 mg | ORAL_TABLET | Freq: Two times a day (BID) | ORAL | Status: DC

## 2010-11-22 NOTE — Assessment & Plan Note (Signed)
Better on 2nd check Needs to be more active Again disc some exercise she could do with her back - water or recumbant bike

## 2010-11-22 NOTE — Patient Instructions (Signed)
I want you to start a regular exercise program 5 days per week I think water exercise or a RECUMBANT exercise bike would help You may be able to trade your current exercise bike in for a RECUMBANT  Bike at [employer] or other used sporting goods store  That may be easier on your back Diabetes is stable - stay on diabetic diet and keep loosing weight  Cholesterol is much improved  Schedule 30 minute check up in 6 months with labs prior

## 2010-11-22 NOTE — Assessment & Plan Note (Signed)
This is stable Disc low glycemic diet- doing better Continue actos- no side eff utd opthy  F/u 6 mo  Disc exercise program in detail

## 2010-11-22 NOTE — Progress Notes (Signed)
Subjective:    Patient ID: Stacey Harris, female    DOB: 30-Jul-1928, 77 y.o.   MRN: [id number]  HPI Here for f/u of lipids and HTN and DM  DM is stable wth a1c of 6.8 Diet -- is making an effort  Is controlling her portions  Is avoiding sweets and simple carbs -- very little bread if any / and sticks to multi grain and no sweets  Wt down 2 lb opthy  Got some back exercises - is helping  babysits and garden exercise    HTN is stable with first check 144/76 Systolic not quite at goal No cp or edema or headache  Even though toprol xl 100 is generic - ins will not pay for any more  Is now 37$    Chol much imp wth zocor and zetia LDL is 64 down from over 200 No problems with the medicine Is cheaper  Is also watching diet-/ trying to  Lab Results  Component Value Date   CHOL 116 11/16/2010   CHOL 272* 08/17/2010   CHOL 126 02/09/2010   Lab Results  Component Value Date   HDL 33.80* 11/16/2010   HDL 33.20* 08/17/2010   HDL 62.95* 02/09/2010   Lab Results  Component Value Date   LDLCALC 64 11/16/2010   LDLCALC 74 02/09/2010   LDLCALC 84 06/09/2009   Lab Results  Component Value Date   TRIG 89.0 11/16/2010   TRIG 190.0* 08/17/2010   TRIG 99.0 02/09/2010   Lab Results  Component Value Date   CHOLHDL 3 11/16/2010   CHOLHDL 8 08/17/2010   CHOLHDL 4 02/09/2010   Lab Results  Component Value Date   LDLDIRECT 202.7 08/17/2010    Past Medical History  Diagnosis Date  . Diabetes mellitus     type II  . Hyperlipidemia   . Hypertension   . Cancer     basal cell skin CA  . Hypothyroidism   . OA (osteoarthritis)     History   Social History  . Marital Status: Widowed    Spouse Name: N/A    Number of Children: N/A  . Years of Education: N/A   Occupational History  . Not on file.   Social History Main Topics  . Smoking status: Never Smoker   . Smokeless tobacco: Not on file  . Alcohol Use: No  . Drug Use:   . Sexually Active:    Other Topics Concern  . Not on file    Social History Narrative  . No narrative on file   Current outpatient prescriptions:ACTOS 30 MG tablet, 1 BY MOUTH ONCE DAILY, Disp: 90 tablet, Rfl: 1;  aspirin 81 MG tablet, Take 81 mg by mouth daily.  , Disp: , Rfl: ;  diazepam (VALIUM) 5 MG tablet, 1/2 to 1 tablet by mouth as needed at bedtime for insomnia. , Disp: , Rfl: ;  ezetimibe (ZETIA) 10 MG tablet, Take 10 mg by mouth daily.  , Disp: , Rfl: ;  folic acid (FOLVITE) 400 MCG tablet, Take 400 mcg by mouth daily.  , Disp: , Rfl:  glucose blood test strip, Test blood sugar two times daily and as needed. , Disp: , Rfl: ;  levothyroxine (SYNTHROID, LEVOTHROID) 50 MCG tablet, Take 50 mcg by mouth daily.  , Disp: , Rfl: ;  metoprolol (TOPROL-XL) 100 MG 24 hr tablet, Take 100 mg by mouth daily.  , Disp: , Rfl: ;  Omega-3 Fatty Acids (FISH OIL) 1200 MG CAPS, Take 1,200 mg  by mouth 2 (two) times daily.  , Disp: , Rfl:  simvastatin (ZOCOR) 20 MG tablet, Take 20 mg by mouth at bedtime.  , Disp: , Rfl: ;  trandolapril (MAVIK) 4 MG tablet, Take 1 tablet (4 mg total) by mouth 2 (two) times daily., Disp: 60 tablet, Rfl: 11;  vitamin E 400 UNIT capsule, Take 400 Units by mouth every other day.  , Disp: , Rfl: ;  HYDROcodone-acetaminophen (VICODIN) 5-500 MG per tablet, 1 tablet by mouth once a day as needed for pain. Do not mix with tylenol OTC. , Disp: , Rfl:     Review of Systems Review of Systems  Constitutional: Negative for fever, appetite change, fatigue and unexpected weight change.  Eyes: Negative for pain and visual disturbance.  Respiratory: Negative for cough and shortness of breath.   Cardiovascular: Negative for cp and sob or edema .   Gastrointestinal: Negative for nausea, diarrhea and constipation.  Genitourinary: Negative for urgency and frequency.  Skin: Negative for pallor or rash msk pos for back and joint pain .  Neurological: Negative for weakness, light-headedness, numbness and headaches.  Hematological: Negative for adenopathy.  Does not bruise/bleed easily.  Psychiatric/Behavioral: Negative for dysphoric mood. The patient is not nervous/anxious.          Objective:   Physical Exam  Constitutional: She appears well-developed and well-nourished.       overwt and well appearing   HENT:  Head: Normocephalic and atraumatic.  Mouth/Throat: Oropharynx is clear and moist.  Eyes: Conjunctivae and EOM are normal. Pupils are equal, round, and reactive to light.  Neck: Normal range of motion. Neck supple. Carotid bruit is not present. No thyromegaly present.  Cardiovascular: Normal rate, regular rhythm and normal heart sounds.   Pulmonary/Chest: Effort normal and breath sounds normal. No respiratory distress. She has no wheezes.  Abdominal: Soft. Bowel sounds are normal. She exhibits no distension, no abdominal bruit and no mass. There is no tenderness.  Musculoskeletal: She exhibits no edema and no tenderness.       Poor rom of spine Pt moves slowly- very stiff after inactivity  Lymphadenopathy:    She has no cervical adenopathy.  Neurological: She is alert. She has normal reflexes. Coordination normal.  Skin: Skin is warm and dry. No rash noted. No erythema. No pallor.  Psychiatric: She has a normal mood and affect.          Assessment & Plan:

## 2010-11-22 NOTE — Assessment & Plan Note (Signed)
Much improved with zocor and zetia  Disc low sat fat diet Rev lab with pt  F/u 6 mo

## 2011-01-24 LAB — HM DIABETES EYE EXAM: HM Diabetic Eye Exam: NEGATIVE

## 2011-02-08 ENCOUNTER — Telehealth: Payer: Self-pay

## 2011-02-08 ENCOUNTER — Encounter: Payer: Self-pay | Admitting: Family Medicine

## 2011-02-08 NOTE — Telephone Encounter (Signed)
Opened telephone call to update health maintenance with [facility]. Could not get into [facility].[name] [facility] faxed negative DM eye exam 01/24/11. Will send fax for scanning.

## 2011-04-14 ENCOUNTER — Other Ambulatory Visit: Payer: Self-pay | Admitting: Family Medicine

## 2011-04-19 ENCOUNTER — Telehealth: Payer: Self-pay | Admitting: *Deleted

## 2011-04-19 NOTE — Telephone Encounter (Signed)
Pt takes actos but says that is too expensive for her.  She says a generic is now available, but, I told her that sometimes, at first, generics can be just as costly as name brand.  She would like the actos changed to something generic and called to [facility].  I told her it may be next week, she said ok.

## 2011-04-21 NOTE — Telephone Encounter (Signed)
Limited options- we need to disc- f/u and bring blood sugar readings with her if she has some

## 2011-04-25 NOTE — Telephone Encounter (Signed)
Left vm for pt to callback 

## 2011-04-26 NOTE — Telephone Encounter (Signed)
Spoke with pt, she will discuss this with you when she comes in for her visit on 11/16.

## 2011-05-07 ENCOUNTER — Inpatient Hospital Stay (HOSPITAL_COMMUNITY): Admit: 2011-05-07 | Payer: Self-pay | Admitting: Orthopaedic Surgery

## 2011-05-16 ENCOUNTER — Other Ambulatory Visit: Payer: Self-pay | Admitting: Orthopaedic Surgery

## 2011-05-20 ENCOUNTER — Telehealth: Payer: Self-pay | Admitting: Family Medicine

## 2011-05-20 DIAGNOSIS — I10 Essential (primary) hypertension: Secondary | ICD-10-CM

## 2011-05-20 DIAGNOSIS — E119 Type 2 diabetes mellitus without complications: Secondary | ICD-10-CM

## 2011-05-20 DIAGNOSIS — E785 Hyperlipidemia, unspecified: Secondary | ICD-10-CM

## 2011-05-20 NOTE — Telephone Encounter (Signed)
Message copied by Judy Pimple on Sun May 20, 2011  2:16 PM ------      Message from: [place], [place] J      Created: Wed May 16, 2011  4:15 PM      Regarding: labs for Mon 11-12       Labs for 6 mth f/u

## 2011-05-21 ENCOUNTER — Other Ambulatory Visit (INDEPENDENT_AMBULATORY_CARE_PROVIDER_SITE_OTHER): Payer: Medicare Other

## 2011-05-21 DIAGNOSIS — I10 Essential (primary) hypertension: Secondary | ICD-10-CM

## 2011-05-21 DIAGNOSIS — E119 Type 2 diabetes mellitus without complications: Secondary | ICD-10-CM

## 2011-05-21 DIAGNOSIS — E785 Hyperlipidemia, unspecified: Secondary | ICD-10-CM

## 2011-05-22 LAB — COMPREHENSIVE METABOLIC PANEL
ALT: 19 U/L (ref 0–35)
AST: 28 U/L (ref 0–37)
Albumin: 3.4 g/dL — ABNORMAL LOW (ref 3.5–5.2)
Alkaline Phosphatase: 56 U/L (ref 39–117)
BUN: 24 mg/dL — ABNORMAL HIGH (ref 6–23)
CO2: 28 mEq/L (ref 19–32)
Calcium: 9.1 mg/dL (ref 8.4–10.5)
Chloride: 110 mEq/L (ref 96–112)
Creatinine, Ser: 1 mg/dL (ref 0.4–1.2)
GFR: 55.41 mL/min — ABNORMAL LOW (ref >60.00)
Glucose, Bld: 117 mg/dL — ABNORMAL HIGH (ref 70–99)
Potassium: 4.6 mEq/L (ref 3.5–5.1)
Sodium: 144 mEq/L (ref 135–145)
Total Bilirubin: 0.7 mg/dL (ref 0.3–1.2)
Total Protein: 6.5 g/dL (ref 6.0–8.3)

## 2011-05-22 LAB — CBC WITH DIFFERENTIAL/PLATELET
Basophils Absolute: 0 10*3/uL (ref 0.0–0.1)
HCT: 35.6 % — ABNORMAL LOW (ref 36.0–46.0)
Hemoglobin: 11.8 g/dL — ABNORMAL LOW (ref 12.0–15.0)
Lymphs Abs: 2.1 10*3/uL (ref 0.7–4.0)
Monocytes Relative: 8.8 % (ref 3.0–12.0)
Neutro Abs: 3.2 10*3/uL (ref 1.4–7.7)
RDW: 15.3 % — ABNORMAL HIGH (ref 11.5–14.6)

## 2011-05-22 LAB — LIPID PANEL
Cholesterol: 111 mg/dL (ref 0–200)
HDL: 38.5 mg/dL — ABNORMAL LOW (ref >39.00)
LDL Cholesterol: 58 mg/dL (ref 0–99)
Total CHOL/HDL Ratio: 3
Triglycerides: 74 mg/dL (ref 0.0–149.0)
VLDL: 14.8 mg/dL (ref 0.0–40.0)

## 2011-05-22 LAB — TSH: TSH: 1.94 u[IU]/mL (ref 0.35–5.50)

## 2011-05-22 LAB — HEMOGLOBIN A1C: Hgb A1c MFr Bld: 6.4 % (ref 4.6–6.5)

## 2011-05-25 ENCOUNTER — Ambulatory Visit (INDEPENDENT_AMBULATORY_CARE_PROVIDER_SITE_OTHER): Payer: Medicare Other | Admitting: Family Medicine

## 2011-05-25 ENCOUNTER — Encounter: Payer: Self-pay | Admitting: Family Medicine

## 2011-05-25 ENCOUNTER — Other Ambulatory Visit (HOSPITAL_COMMUNITY): Payer: Self-pay | Admitting: Orthopaedic Surgery

## 2011-05-25 VITALS — BP 135/82 | HR 64 | Temp 98.1°F | Ht 62.0 in | Wt 198.0 lb

## 2011-05-25 DIAGNOSIS — D649 Anemia, unspecified: Secondary | ICD-10-CM

## 2011-05-25 DIAGNOSIS — E785 Hyperlipidemia, unspecified: Secondary | ICD-10-CM

## 2011-05-25 DIAGNOSIS — Z1211 Encounter for screening for malignant neoplasm of colon: Secondary | ICD-10-CM

## 2011-05-25 DIAGNOSIS — E119 Type 2 diabetes mellitus without complications: Secondary | ICD-10-CM

## 2011-05-25 DIAGNOSIS — Z23 Encounter for immunization: Secondary | ICD-10-CM

## 2011-05-25 DIAGNOSIS — I10 Essential (primary) hypertension: Secondary | ICD-10-CM

## 2011-05-25 NOTE — Progress Notes (Signed)
Subjective:    Patient ID: Stacey Harris, female    DOB: October 08, 1928, 77 y.o.   MRN: [id number]  HPI Here for f/u of DM and hyperlipidemia and HTN  Is doing pretty well overall   Is having back surgery in December -- has spurs and cyst and bad disks  Also dx with a "kidney" aneurysm - that will need to be watched  Is looking forward to getting it over with Sees Dr Ophelia Charter   DM - improved with a1c 6.4 Diet- is working hard with that , is decreasing sugar in diet -- has switched out fruit for sweets  Exercise - very little due to back - did go to PT and some home exercise  No change in med  opthy ok 7/12 Wants to loose weight   HTN bp first check 164/82 Has not been checking at home  No cp or edema or ha  Wt is up 2 lb with bmi of 36  Hb was 11.8 In past with hip surgery had to have 2 units of blood  Has had colonoscopy - was normal  Does not eat a lot of meat   Lab Results  Component Value Date   TSH 1.94 05/21/2011   good tsh- no change in dose  Flu shot - just got that today   Lab Results  Component Value Date   CHOL 111 05/21/2011   CHOL 116 11/16/2010   CHOL 272* 08/17/2010   Lab Results  Component Value Date   HDL 38.50* 05/21/2011   HDL 33.80* 11/16/2010   HDL 33.20* 08/17/2010   Lab Results  Component Value Date   LDLCALC 58 05/21/2011   LDLCALC 64 11/16/2010   LDLCALC 74 02/09/2010   Lab Results  Component Value Date   TRIG 74.0 05/21/2011   TRIG 89.0 11/16/2010   TRIG 190.0* 08/17/2010   Lab Results  Component Value Date   CHOLHDL 3 05/21/2011   CHOLHDL 3 11/16/2010   CHOLHDL 8 08/17/2010   Lab Results  Component Value Date   LDLDIRECT 202.7 08/17/2010    Patient Active Problem List  Diagnoses  . BASAL CELL CARCINOMA, FACE  . DIABETES, TYPE 2  . DIABETIC PERIPHERAL NEUROPATHY  . HYPERLIPIDEMIA  . OBESITY NOS  . HYPERTENSION  . MENOPAUSAL SYNDROME  . ROSACEA  . CORNS AND CALLOSITIES  . OSTEOARTHRITIS, HIP  . INCONTINENCE, URGE  . COLONOSCOPY  AND REMOVAL OF LESION, HX OF  . BASAL CELL CARCINOMA, FACE  . BACK PAIN, LUMBAR  . POSTNASAL DRIP  . Anemia, mild   Past Medical History  Diagnosis Date  . Diabetes mellitus     type II  . Hyperlipidemia   . Hypertension   . Cancer     basal cell skin CA  . Hypothyroidism   . OA (osteoarthritis)    Past Surgical History  Procedure Date  . [place]      and BSO  . Tonsillectomy   . Mohs surgery 12/08    basal cell skin cancer lt temple   History  Substance Use Topics  . Smoking status: Never Smoker   . Smokeless tobacco: Not on file  . Alcohol Use: No   Family History  Problem Relation Age of Onset  . Arthritis Father     RA  . Hypertension Father   . Heart disease Father     CAD  . Diabetes Father   . Arthritis Sister     RA  . Diabetes Brother   .  Diabetes Brother   . Diabetes Brother    Allergies  Allergen Reactions  . Metformin     REACTION: increase cr  . Rosuvastatin     REACTION: balance problems   Current Outpatient Prescriptions on File Prior to Visit  Medication Sig Dispense Refill  . ACTOS 30 MG tablet 1 BY MOUTH ONCE DAILY  90 tablet  1  . aspirin 81 MG tablet Take 81 mg by mouth daily.        [name] ezetimibe (ZETIA) 10 MG tablet Take 10 mg by mouth daily.        . folic acid (FOLVITE) 400 MCG tablet Take 400 mcg by mouth daily.        [name] glucose blood test strip Test blood sugar two times daily and as needed.       [name] levothyroxine (SYNTHROID, LEVOTHROID) 50 MCG tablet Take 50 mcg by mouth daily.        . metoprolol (TOPROL-XL) 100 MG 24 hr tablet Take 100 mg by mouth daily.        . Omega-3 Fatty Acids (FISH OIL) 1200 MG CAPS Take 1,200 mg by mouth 2 (two) times daily.        . simvastatin (ZOCOR) 20 MG tablet Take 20 mg by mouth at bedtime.        . trandolapril (MAVIK) 4 MG tablet Take 1 tablet (4 mg total) by mouth 2 (two) times daily.  60 tablet  11  . diazepam (VALIUM) 5 MG tablet 1/2 to 1 tablet by mouth as needed at bedtime for insomnia.         [name] HYDROcodone-acetaminophen (VICODIN) 5-500 MG per tablet 1 tablet by mouth once a day as needed for pain. Do not mix with tylenol OTC.       . vitamin E 400 UNIT capsule Take 400 Units by mouth every other day.          Review of Systems Review of Systems  Constitutional: Negative for fever, appetite change, fatigue and unexpected weight change.  Eyes: Negative for pain and visual disturbance.  Respiratory: Negative for cough and shortness of breath.   Cardiovascular: Negative for cp or palpitations    Gastrointestinal: Negative for nausea, diarrhea and constipation.  Genitourinary: Negative for urgency and frequency.  Skin: Negative for pallor or rash   Neurological: Negative for weakness, light-headedness, numbness and headaches.  Hematological: Negative for adenopathy. Does not bruise/bleed easily.  Psychiatric/Behavioral: Negative for dysphoric mood. The patient is not nervous/anxious.          Objective:   Physical Exam  Constitutional: She appears well-developed and well-nourished. No distress.       overwt and well appearing   HENT:  Head: Normocephalic and atraumatic.  Mouth/Throat: Oropharynx is clear and moist.  Eyes: Conjunctivae and EOM are normal. Pupils are equal, round, and reactive to light. No scleral icterus.  Neck: Normal range of motion. Neck supple. No JVD present. Carotid bruit is not present. No thyromegaly present.  Cardiovascular: Normal rate, regular rhythm, normal heart sounds and intact distal pulses.  Exam reveals no gallop.   Pulmonary/Chest: Effort normal and breath sounds normal. No respiratory distress. She has no wheezes.  Abdominal: Soft. Bowel sounds are normal. She exhibits no distension and no mass. There is no tenderness.  Musculoskeletal: She exhibits no edema and no tenderness.       Poor rom LS  Lymphadenopathy:    She has no cervical adenopathy.  Neurological: She is alert. She  has normal reflexes. No cranial nerve deficit. She exhibits  normal muscle tone. Coordination normal.  Skin: Skin is warm and dry. No rash noted. No erythema. No pallor.  Psychiatric: She has a normal mood and affect.          Assessment & Plan:

## 2011-05-25 NOTE — Patient Instructions (Addendum)
Please do stool card for colon cancer screening  You are a little anemic and I am not sure why  Remember that you need a mammogram after you surgery is done  Keep up with diabetic diet Do upper body exercise as much as you  Labs look ok overall  Follow up in march

## 2011-05-27 NOTE — Assessment & Plan Note (Signed)
Doing well with actos (tol well) and diet control  Lab Results  Component Value Date   HGBA1C 6.4 05/21/2011    Rev low glycemic diet  Disc plan for exercise  No change in med

## 2011-05-27 NOTE — Assessment & Plan Note (Signed)
Disc goals for lipids and reasons to control them Rev labs with pt Rev low sat fat diet in detail  No problems with zocor and zetia

## 2011-05-27 NOTE — Assessment & Plan Note (Signed)
Better bp on 2nd check bp in fair control at this time  No changes needed  Disc lifstyle change with low sodium diet and exercise   

## 2011-05-27 NOTE — Assessment & Plan Note (Signed)
New, could be dietary - pt eats little food with iron  Will check IFOB and adv further May need to take iron before her surgery - or check iron levels

## 2011-06-15 ENCOUNTER — Encounter (HOSPITAL_COMMUNITY): Payer: Self-pay

## 2011-06-18 ENCOUNTER — Telehealth: Payer: Self-pay | Admitting: Internal Medicine

## 2011-06-18 NOTE — Telephone Encounter (Signed)
Since that was not addressed at that visit -needs to be examined to see what we are dealing with / if surgery will be safe Please schedule with first availible

## 2011-06-18 NOTE — Telephone Encounter (Signed)
Patient's daughter called and stated patient is having chest congestion, no fever and coughing, she is scheduled to have back surgery Thursday and wanted something called in to pharmacy so she could take care of this.  Patient's daughter stated she was just seen on 11.16.12 and felt she didn't need to make an appt. I advised her that she needs to be seen for a antibiotic so you could assess her but she wants to know if you can call something in w/out her being seen.

## 2011-06-18 NOTE — Telephone Encounter (Signed)
Patient notified as instructed by telephone. Gave pt couple of options for appts and she said she would have to speak with her daughter or daughter in law before she could make appt. Pt will call back for appt after she cks with family.

## 2011-06-19 ENCOUNTER — Encounter (HOSPITAL_COMMUNITY)
Admission: RE | Admit: 2011-06-19 | Discharge: 2011-06-19 | Disposition: A | Discharge status: Home / Self Care | Payer: Medicare Other | Source: Ambulatory Visit | Attending: Orthopaedic Surgery | Admitting: Orthopaedic Surgery

## 2011-06-19 ENCOUNTER — Other Ambulatory Visit: Payer: Self-pay

## 2011-06-19 ENCOUNTER — Encounter (HOSPITAL_COMMUNITY): Payer: Self-pay

## 2011-06-19 ENCOUNTER — Encounter (HOSPITAL_COMMUNITY)
Admission: RE | Admit: 2011-06-19 | Discharge: 2011-06-19 | Disposition: A | Discharge status: Home / Self Care | Payer: Medicare Other | Source: Ambulatory Visit | Attending: Anesthesiology | Admitting: Anesthesiology

## 2011-06-19 HISTORY — DX: Encounter for other specified aftercare: Z51.89

## 2011-06-19 HISTORY — DX: Reserved for inherently not codable concepts without codable children: IMO0001

## 2011-06-19 LAB — COMPREHENSIVE METABOLIC PANEL
Albumin: 3.2 g/dL — ABNORMAL LOW (ref 3.5–5.2)
BUN: 16 mg/dL (ref 6–23)
Calcium: 9 mg/dL (ref 8.4–10.5)
Chloride: 107 mEq/L (ref 96–112)
Creatinine, Ser: 0.91 mg/dL (ref 0.50–1.10)
Total Bilirubin: 0.4 mg/dL (ref 0.3–1.2)
Total Protein: 6.6 g/dL (ref 6.0–8.3)

## 2011-06-19 LAB — CBC
HCT: 38.3 % (ref 36.0–46.0)
MCH: 31.8 pg (ref 26.0–34.0)
MCHC: 33.2 g/dL (ref 30.0–36.0)
MCV: 95.8 fL (ref 78.0–100.0)
Platelets: 123 10*3/uL — ABNORMAL LOW (ref 150–400)
RDW: 15 % (ref 11.5–15.5)

## 2011-06-19 LAB — URINALYSIS, ROUTINE W REFLEX MICROSCOPIC
Bilirubin Urine: NEGATIVE
Ketones, ur: NEGATIVE mg/dL
Nitrite: NEGATIVE
Specific Gravity, Urine: 1.019 (ref 1.005–1.030)
Urobilinogen, UA: 1 mg/dL (ref 0.0–1.0)

## 2011-06-19 LAB — SURGICAL PCR SCREEN: Staphylococcus aureus: NEGATIVE

## 2011-06-19 LAB — PROTIME-INR
INR: 1.04 (ref 0.00–1.49)
Prothrombin Time: 13.8 seconds (ref 11.6–15.2)

## 2011-06-19 LAB — URINE MICROSCOPIC-ADD ON

## 2011-06-19 NOTE — Pre-Procedure Instructions (Signed)
20 Stacey Harris  06/19/2011   Your procedure is scheduled on: 06/22/11  Report to [facility] at 530AM.  Call this number if you have problems the morning of surgery: [phone]   Remember:   Do not eat food:After Midnight.  May have clear liquids: up to 4 Hours before arrival.  Clear liquids include soda, tea, black coffee, apple or grape juice, broth.  Take these medicines the morning of surgery with A SIP OF WATER:valium  Synthroid, metoprolol   Do not wear jewelry, make-up or nail polish.  Do not wear lotions, powders, or perfumes. You may wear deodorant.  Do not shave 48 hours prior to surgery.  Do not bring valuables to the hospital.  Contacts, dentures or bridgework may not be worn into surgery.  Leave suitcase in the car. After surgery it may be brought to your room.  For patients admitted to the hospital, checkout time is 11:00 AM the day of discharge.   Patients discharged the day of surgery will not be allowed to drive home.  Name and phone number of your driver: elaine [phone]  Special Instructions: CHG Shower Use Special Wash: 1/2 bottle night before surgery and 1/2 bottle morning of surgery.   Please read over the following fact sheets that you were given: Pain Booklet, Coughing and Deep Breathing, MRSA Information and Surgical Site Infection Prevention

## 2011-06-20 NOTE — Consult Note (Signed)
Anesthesia:  Patient is an 77 year old female scheduled for L3-4, 4-5 decompression on 06/22/11.  Her history includes HTN, HLD, hypothyrodism, DM2, and basal cell skin cancer.  I was asked to review her preoperative labs and EKG.   Her EKG showed NSR with possible anterior and inferior infarct.  This is not significantly changed from her last EKG on 04/06/09 which was done prior to her right THR. She also had an ORIF of a fibula fracture as well that year.  No CV symptoms were reported at her PAT appointment.  Her labs show a mildly elevated AST of 44, glucose of 116, and mild thrombocytopenia with a PLT count of 123K.  Her urine was hazy with moderate leukocytes but negative nitrites.  I called her UA results to [place] at Dr. Ophelia Charter' office.  Her CXR showed no active disease. Stable probable chronic mild interstitial prominence.   Since her EKG is stable since 2010, and she has had 2 Orthopedic procedures since then, I anticipate she will be okay to proceed if she remains asymptomatic.

## 2011-06-21 DIAGNOSIS — M48061 Spinal stenosis, lumbar region without neurogenic claudication: Secondary | ICD-10-CM | POA: Diagnosis present

## 2011-06-21 MED ORDER — CEFAZOLIN SODIUM-DEXTROSE 2-3 GM-% IV SOLR
2.0000 g | INTRAVENOUS | Status: AC
  Administered 2011-06-22: 2 g via INTRAVENOUS
  Filled 2011-06-21: qty 50

## 2011-06-21 NOTE — H&P (Signed)
Stacey Harris is an 77 y.o. female.   Chief Complaint: lower extremity pain and weakness with ambulation short distances HPI: Pt has had several months of worsening symptoms of neurogenic claudication.  She has leg pain relieved with rest.  She has difficulty performing ADLs because of fatigue in her legs. Pt has failed conservative treatment with ESI, activity modification and analgesics.  MRI scan of the lumbar spine shows severe stenosis at the L3-4 and L4-5 levels .  Narrowing of the canal to less than 10mm at both levels with significant central stenosis. It is felt pt will require surgical intervention and will be admitted for central decompression at L3-4 and L4-5 by Dr Ophelia Charter.   Past Medical History  Diagnosis Date  . Hyperlipidemia   . Hypertension   . Hypothyroidism   . OA (osteoarthritis)   . Cancer     basal cell skin CA  . Diabetes mellitus     type II  . Blood transfusion     Past Surgical History  Procedure Date  . [place]      and BSO  . Tonsillectomy   . Mohs surgery 12/08    basal cell skin cancer lt temple  . Joint replacement     rt total hip 10  . Ankle fracture surgery 10    rt    Family History  Problem Relation Age of Onset  . Arthritis Father     RA  . Hypertension Father   . Heart disease Father     CAD  . Diabetes Father   . Arthritis Sister     RA  . Diabetes Brother   . Diabetes Brother   . Diabetes Brother    Social History:  reports that she has never smoked. She does not have any smokeless tobacco history on file. She reports that she does not drink alcohol or use illicit drugs.  Allergies:  Allergies  Allergen Reactions  . Metformin Other (See Comments)    Pt doesn't remember taking medication  . Rosuvastatin Other (See Comments)    REACTION: balance problems    Medications Prior to Admission  Medication Dose Route Frequency Provider Last Rate Last Dose  . ceFAZolin (ANCEF) IVPB 2 g/50 mL premix  2 g Intravenous 60 min Pre-Op Anh  P Pham, PHARMD       Medications Prior to Admission  Medication Sig Dispense Refill  . aspirin 81 MG tablet Take 81 mg by mouth daily.        . diazepam (VALIUM) 5 MG tablet 1/2 to 1 tablet by mouth as needed at bedtime for insomnia.       [name] ezetimibe (ZETIA) 10 MG tablet Take 10 mg by mouth daily.        . folic acid (FOLVITE) 400 MCG tablet Take 400 mcg by mouth daily.        [name] glucose blood test strip Test blood sugar two times daily and as needed.       [name] levothyroxine (SYNTHROID, LEVOTHROID) 50 MCG tablet Take 50 mcg by mouth daily.        . metoprolol (TOPROL-XL) 100 MG 24 hr tablet Take 100 mg by mouth daily.        . Omega-3 Fatty Acids (FISH OIL) 1200 MG CAPS Take 1,200 mg by mouth 2 (two) times daily.        . pioglitazone (ACTOS) 30 MG tablet        . simvastatin (ZOCOR) 20 MG tablet Take  20 mg by mouth at bedtime.        . trandolapril (MAVIK) 4 MG tablet Take 1 tablet (4 mg total) by mouth 2 (two) times daily.  60 tablet  11    No results found for this or any previous visit (from the past 48 hour(s)). No results found.  Review of Systems  Constitutional: Negative.   HENT: Positive for congestion and sore throat.        Recent upper respiratory symptoms a week ago resolving with OTC meds  Eyes: Negative.   Respiratory: Positive for cough.   Cardiovascular: Negative.   Gastrointestinal: Negative.   Genitourinary: Negative.   Musculoskeletal: Positive for back pain.  Skin: Negative.   Neurological: Negative.   Endo/Heme/Allergies: Negative.   Psychiatric/Behavioral: Negative.     There were no vitals taken for this visit. Physical Exam  Constitutional: She is oriented to person, place, and time. She appears well-developed and well-nourished.  HENT:  Head: Normocephalic and atraumatic.  Mouth/Throat: Oropharynx is clear and moist.  Eyes: EOM are normal. Pupils are equal, round, and reactive to light.  Neck: Normal range of motion. Neck supple.  Cardiovascular:  Normal rate, regular rhythm, normal heart sounds and intact distal pulses.   No murmur heard. Respiratory: Effort normal and breath sounds normal.  GI: Soft. Bowel sounds are normal.  Musculoskeletal:       No focal weakness in lower extremities.  Trace edema bil ankles.  Negative SLR bilateral   Neurological: She is alert and oriented to person, place, and time.  Skin: Skin is warm and dry.  Psychiatric: She has a normal mood and affect.     Assessment/Plan Central lumbar spinal stenosis at L3-4 and L4-5 Proceed with central lumbar decompression at L3-4 and L4-5.  Aaralyn Kil M 06/21/2011, 3:16 PM

## 2011-06-22 ENCOUNTER — Ambulatory Visit (HOSPITAL_COMMUNITY): Payer: Medicare Other

## 2011-06-22 ENCOUNTER — Ambulatory Visit (HOSPITAL_COMMUNITY)
Admission: RE | Admit: 2011-06-22 | Discharge: 2011-06-23 | Disposition: A | Discharge status: Home / Self Care | Payer: Medicare Other | Source: Ambulatory Visit | Attending: Orthopaedic Surgery | Admitting: Orthopaedic Surgery

## 2011-06-22 ENCOUNTER — Inpatient Hospital Stay (HOSPITAL_COMMUNITY): Payer: Medicare Other | Admitting: Vascular Surgery

## 2011-06-22 ENCOUNTER — Encounter (HOSPITAL_COMMUNITY): Payer: Self-pay | Admitting: *Deleted

## 2011-06-22 ENCOUNTER — Encounter (HOSPITAL_COMMUNITY)
Admission: RE | Disposition: A | Discharge status: Home / Self Care | Payer: Self-pay | Source: Ambulatory Visit | Attending: Orthopaedic Surgery

## 2011-06-22 ENCOUNTER — Encounter (HOSPITAL_COMMUNITY): Payer: Self-pay | Admitting: Vascular Surgery

## 2011-06-22 DIAGNOSIS — M48062 Spinal stenosis, lumbar region with neurogenic claudication: Principal | ICD-10-CM | POA: Insufficient documentation

## 2011-06-22 DIAGNOSIS — M79609 Pain in unspecified limb: Secondary | ICD-10-CM | POA: Insufficient documentation

## 2011-06-22 DIAGNOSIS — M1990 Unspecified osteoarthritis, unspecified site: Secondary | ICD-10-CM | POA: Insufficient documentation

## 2011-06-22 DIAGNOSIS — I10 Essential (primary) hypertension: Secondary | ICD-10-CM | POA: Insufficient documentation

## 2011-06-22 DIAGNOSIS — K08409 Partial loss of teeth, unspecified cause, unspecified class: Secondary | ICD-10-CM | POA: Insufficient documentation

## 2011-06-22 DIAGNOSIS — G96198 Other disorders of meninges, not elsewhere classified: Secondary | ICD-10-CM | POA: Insufficient documentation

## 2011-06-22 DIAGNOSIS — Z01810 Encounter for preprocedural cardiovascular examination: Secondary | ICD-10-CM | POA: Insufficient documentation

## 2011-06-22 DIAGNOSIS — E785 Hyperlipidemia, unspecified: Secondary | ICD-10-CM | POA: Insufficient documentation

## 2011-06-22 DIAGNOSIS — M48061 Spinal stenosis, lumbar region without neurogenic claudication: Secondary | ICD-10-CM | POA: Diagnosis present

## 2011-06-22 DIAGNOSIS — Z01818 Encounter for other preprocedural examination: Secondary | ICD-10-CM | POA: Insufficient documentation

## 2011-06-22 DIAGNOSIS — E119 Type 2 diabetes mellitus without complications: Secondary | ICD-10-CM | POA: Insufficient documentation

## 2011-06-22 DIAGNOSIS — Z01812 Encounter for preprocedural laboratory examination: Secondary | ICD-10-CM | POA: Insufficient documentation

## 2011-06-22 DIAGNOSIS — E039 Hypothyroidism, unspecified: Secondary | ICD-10-CM | POA: Insufficient documentation

## 2011-06-22 HISTORY — PX: LUMBAR LAMINECTOMY/DECOMPRESSION MICRODISCECTOMY: SHX5026

## 2011-06-22 LAB — GLUCOSE, CAPILLARY
Glucose-Capillary: 144 mg/dL — ABNORMAL HIGH (ref 70–99)
Glucose-Capillary: 147 mg/dL — ABNORMAL HIGH (ref 70–99)

## 2011-06-22 SURGERY — LUMBAR LAMINECTOMY/DECOMPRESSION MICRODISCECTOMY
Anesthesia: General | Site: Back | Wound class: Clean

## 2011-06-22 MED ORDER — BUPIVACAINE HCL (PF) 0.25 % IJ SOLN
INTRAMUSCULAR | Status: DC | PRN
  Administered 2011-06-22: 10 mL

## 2011-06-22 MED ORDER — PHENOL 1.4 % MT LIQD
1.0000 | OROMUCOSAL | Status: DC | PRN
  Filled 2011-06-22: qty 177

## 2011-06-22 MED ORDER — NEOSTIGMINE METHYLSULFATE 1 MG/ML IJ SOLN
INTRAMUSCULAR | Status: DC | PRN
  Administered 2011-06-22: 4 mg via INTRAVENOUS

## 2011-06-22 MED ORDER — LACTATED RINGERS IV SOLN
INTRAVENOUS | Status: DC | PRN
  Administered 2011-06-22 (×2): via INTRAVENOUS

## 2011-06-22 MED ORDER — EZETIMIBE 10 MG PO TABS
10.0000 mg | ORAL_TABLET | Freq: Every day | ORAL | Status: DC
  Filled 2011-06-22: qty 1

## 2011-06-22 MED ORDER — OXYCODONE-ACETAMINOPHEN 5-325 MG PO TABS: 1.0000 | ORAL_TABLET | ORAL | Status: DC | PRN

## 2011-06-22 MED ORDER — ZOLPIDEM TARTRATE 5 MG PO TABS: 5.0000 mg | ORAL_TABLET | Freq: Every evening | ORAL | Status: DC | PRN

## 2011-06-22 MED ORDER — THROMBIN 20000 UNITS EX KIT
PACK | CUTANEOUS | Status: DC | PRN
  Administered 2011-06-22: 09:00:00 via TOPICAL

## 2011-06-22 MED ORDER — PROPOFOL 10 MG/ML IV EMUL
INTRAVENOUS | Status: DC | PRN
  Administered 2011-06-22: 130 mg via INTRAVENOUS

## 2011-06-22 MED ORDER — GLYCOPYRROLATE 0.2 MG/ML IJ SOLN
INTRAMUSCULAR | Status: DC | PRN
  Administered 2011-06-22: .6 mg via INTRAVENOUS
  Administered 2011-06-22: 0.2 mg via INTRAVENOUS

## 2011-06-22 MED ORDER — FENTANYL CITRATE 0.05 MG/ML IJ SOLN
INTRAMUSCULAR | Status: DC | PRN
  Administered 2011-06-22 (×4): 50 ug via INTRAVENOUS

## 2011-06-22 MED ORDER — SIMVASTATIN 20 MG PO TABS
20.0000 mg | ORAL_TABLET | Freq: Every day | ORAL | Status: DC
  Administered 2011-06-22: 20 mg via ORAL
  Filled 2011-06-22 (×3): qty 1

## 2011-06-22 MED ORDER — ROCURONIUM BROMIDE 100 MG/10ML IV SOLN
INTRAVENOUS | Status: DC | PRN
  Administered 2011-06-22: 50 mg via INTRAVENOUS

## 2011-06-22 MED ORDER — ONDANSETRON HCL 4 MG/2ML IJ SOLN
INTRAMUSCULAR | Status: DC | PRN
  Administered 2011-06-22: 4 mg via INTRAVENOUS

## 2011-06-22 MED ORDER — MENTHOL 3 MG MT LOZG: 1.0000 | LOZENGE | OROMUCOSAL | Status: DC | PRN

## 2011-06-22 MED ORDER — ONDANSETRON HCL 4 MG/2ML IJ SOLN: 4.0000 mg | Freq: Four times a day (QID) | INTRAMUSCULAR | Status: DC | PRN

## 2011-06-22 MED ORDER — MIDAZOLAM HCL 5 MG/5ML IJ SOLN
INTRAMUSCULAR | Status: DC | PRN
  Administered 2011-06-22: 1 mg via INTRAVENOUS

## 2011-06-22 MED ORDER — ACETAMINOPHEN 325 MG PO TABS: 650.0000 mg | ORAL_TABLET | ORAL | Status: DC | PRN

## 2011-06-22 MED ORDER — HYDROCODONE-ACETAMINOPHEN 5-325 MG PO TABS
1.0000 | ORAL_TABLET | ORAL | Status: DC | PRN
  Administered 2011-06-22 – 2011-06-23 (×4): 1 via ORAL
  Filled 2011-06-22: qty 1
  Filled 2011-06-22: qty 2
  Filled 2011-06-22 (×2): qty 1

## 2011-06-22 MED ORDER — POTASSIUM CHLORIDE IN NACL 20-0.45 MEQ/L-% IV SOLN
INTRAVENOUS | Status: DC
  Administered 2011-06-22 – 2011-06-23 (×2): via INTRAVENOUS
  Filled 2011-06-22 (×3): qty 1000

## 2011-06-22 MED ORDER — PIOGLITAZONE HCL 30 MG PO TABS
30.0000 mg | ORAL_TABLET | Freq: Every day | ORAL | Status: DC
  Administered 2011-06-23: 30 mg via ORAL
  Filled 2011-06-22: qty 1

## 2011-06-22 MED ORDER — METOPROLOL SUCCINATE ER 100 MG PO TB24
100.0000 mg | ORAL_TABLET | Freq: Every day | ORAL | Status: DC
Start: 2011-06-22 — End: 2011-06-23
  Administered 2011-06-23: 100 mg via ORAL
  Filled 2011-06-22: qty 1

## 2011-06-22 MED ORDER — METHOCARBAMOL 100 MG/ML IJ SOLN
500.0000 mg | Freq: Four times a day (QID) | INTRAMUSCULAR | Status: DC | PRN
  Administered 2011-06-22: 500 mg via INTRAVENOUS
  Filled 2011-06-22: qty 5

## 2011-06-22 MED ORDER — SENNOSIDES-DOCUSATE SODIUM 8.6-50 MG PO TABS: 1.0000 | ORAL_TABLET | Freq: Every evening | ORAL | Status: DC | PRN

## 2011-06-22 MED ORDER — MORPHINE SULFATE 4 MG/ML IJ SOLN: 1.0000 mg | INTRAMUSCULAR | Status: DC | PRN

## 2011-06-22 MED ORDER — ONDANSETRON HCL 4 MG/2ML IJ SOLN: 4.0000 mg | INTRAMUSCULAR | Status: DC | PRN

## 2011-06-22 MED ORDER — ASPIRIN 81 MG PO CHEW
81.0000 mg | CHEWABLE_TABLET | Freq: Every day | ORAL | Status: DC
  Administered 2011-06-23: 81 mg via ORAL
  Filled 2011-06-22: qty 1

## 2011-06-22 MED ORDER — EZETIMIBE 10 MG PO TABS
10.0000 mg | ORAL_TABLET | Freq: Every day | ORAL | Status: DC
  Administered 2011-06-22: 10 mg via ORAL
  Filled 2011-06-22 (×2): qty 1

## 2011-06-22 MED ORDER — METHOCARBAMOL 500 MG PO TABS
500.0000 mg | ORAL_TABLET | Freq: Four times a day (QID) | ORAL | Status: DC | PRN
Start: 2011-06-22 — End: 2011-06-23
  Administered 2011-06-23: 500 mg via ORAL
  Filled 2011-06-22 (×2): qty 1

## 2011-06-22 MED ORDER — EPHEDRINE SULFATE 50 MG/ML IJ SOLN
INTRAMUSCULAR | Status: DC | PRN
  Administered 2011-06-22: 10 mg via INTRAVENOUS

## 2011-06-22 MED ORDER — INSULIN ASPART 100 UNIT/ML ~~LOC~~ SOLN
0.0000 [IU] | Freq: Three times a day (TID) | SUBCUTANEOUS | Status: DC
  Administered 2011-06-22: 2 [IU] via SUBCUTANEOUS
  Filled 2011-06-22 (×2): qty 3

## 2011-06-22 MED ORDER — DOCUSATE SODIUM 100 MG PO CAPS
100.0000 mg | ORAL_CAPSULE | Freq: Two times a day (BID) | ORAL | Status: DC
  Administered 2011-06-22 – 2011-06-23 (×2): 100 mg via ORAL
  Filled 2011-06-22 (×3): qty 1

## 2011-06-22 MED ORDER — CEFAZOLIN SODIUM 1-5 GM-% IV SOLN
1.0000 g | Freq: Three times a day (TID) | INTRAVENOUS | Status: AC
  Administered 2011-06-22 (×2): 1 g via INTRAVENOUS
  Filled 2011-06-22 (×2): qty 50

## 2011-06-22 MED ORDER — PANTOPRAZOLE SODIUM 40 MG IV SOLR
40.0000 mg | Freq: Every day | INTRAVENOUS | Status: DC
  Administered 2011-06-22: 40 mg via INTRAVENOUS
  Filled 2011-06-22 (×2): qty 40

## 2011-06-22 MED ORDER — HYDROMORPHONE HCL PF 1 MG/ML IJ SOLN
0.2500 mg | INTRAMUSCULAR | Status: DC | PRN
  Administered 2011-06-22 (×2): 0.5 mg via INTRAVENOUS

## 2011-06-22 MED ORDER — KETOROLAC TROMETHAMINE 30 MG/ML IJ SOLN
30.0000 mg | Freq: Once | INTRAMUSCULAR | Status: AC
  Administered 2011-06-22: 30 mg via INTRAVENOUS
  Filled 2011-06-22: qty 1

## 2011-06-22 MED ORDER — LEVOTHYROXINE SODIUM 50 MCG PO TABS
50.0000 ug | ORAL_TABLET | Freq: Every day | ORAL | Status: DC
  Administered 2011-06-22 – 2011-06-23 (×2): 50 ug via ORAL
  Filled 2011-06-22 (×2): qty 1

## 2011-06-22 MED ORDER — TRANDOLAPRIL 4 MG PO TABS
4.0000 mg | ORAL_TABLET | Freq: Two times a day (BID) | ORAL | Status: DC
  Administered 2011-06-22 – 2011-06-23 (×2): 4 mg via ORAL
  Filled 2011-06-22 (×4): qty 1

## 2011-06-22 MED ORDER — ACETAMINOPHEN 650 MG RE SUPP: 650.0000 mg | RECTAL | Status: DC | PRN

## 2011-06-22 SURGICAL SUPPLY — 47 items
BENZOIN TINCTURE PRP APPL 2/3 (GAUZE/BANDAGES/DRESSINGS) ×2 IMPLANT
BUR ROUND FLUTED 4 SOFT TCH (BURR) ×2 IMPLANT
CLOTH BEACON ORANGE TIMEOUT ST (SAFETY) ×2 IMPLANT
CORDS BIPOLAR (ELECTRODE) ×2 IMPLANT
COVER SURGICAL LIGHT HANDLE (MISCELLANEOUS) ×2 IMPLANT
DRAPE MICROSCOPE LEICA (MISCELLANEOUS) ×2 IMPLANT
DRAPE PROXIMA HALF (DRAPES) ×4 IMPLANT
DRSG EMULSION OIL 3X3 NADH (GAUZE/BANDAGES/DRESSINGS) IMPLANT
DURAPREP 26ML APPLICATOR (WOUND CARE) ×2 IMPLANT
ELECT BLADE 4.0 EZ CLEAN MEGAD (MISCELLANEOUS) ×2
ELECT REM PT RETURN 9FT ADLT (ELECTROSURGICAL) ×2
ELECTRODE BLDE 4.0 EZ CLN MEGD (MISCELLANEOUS) ×1 IMPLANT
ELECTRODE REM PT RTRN 9FT ADLT (ELECTROSURGICAL) ×1 IMPLANT
GLOVE BIOGEL PI IND STRL 7.5 (GLOVE) ×1 IMPLANT
GLOVE BIOGEL PI IND STRL 8 (GLOVE) ×1 IMPLANT
GLOVE BIOGEL PI INDICATOR 7.5 (GLOVE) ×1
GLOVE BIOGEL PI INDICATOR 8 (GLOVE) ×1
GLOVE ECLIPSE 7.0 STRL STRAW (GLOVE) ×2 IMPLANT
GLOVE ORTHO TXT STRL SZ7.5 (GLOVE) ×2 IMPLANT
GOWN PREVENTION PLUS LG XLONG (DISPOSABLE) ×2 IMPLANT
GOWN STRL NON-REIN LRG LVL3 (GOWN DISPOSABLE) ×6 IMPLANT
KIT BASIN OR (CUSTOM PROCEDURE TRAY) ×2 IMPLANT
KIT ROOM TURNOVER OR (KITS) ×2 IMPLANT
MANIFOLD NEPTUNE II (INSTRUMENTS) ×2 IMPLANT
NDL SUT .5 MAYO 1.404X.05X (NEEDLE) ×1 IMPLANT
NEEDLE HYPO 25GX1X1/2 BEV (NEEDLE) ×2 IMPLANT
NEEDLE MAYO TAPER (NEEDLE) ×1
NEEDLE SPNL 18GX3.5 QUINCKE PK (NEEDLE) ×2 IMPLANT
NS IRRIG 1000ML POUR BTL (IV SOLUTION) ×2 IMPLANT
PACK LAMINECTOMY ORTHO (CUSTOM PROCEDURE TRAY) ×2 IMPLANT
PAD ARMBOARD 7.5X6 YLW CONV (MISCELLANEOUS) ×4 IMPLANT
PATTIES SURGICAL .5 X.5 (GAUZE/BANDAGES/DRESSINGS) ×2 IMPLANT
PATTIES SURGICAL .75X.75 (GAUZE/BANDAGES/DRESSINGS) IMPLANT
SPONGE GAUZE 4X4 12PLY (GAUZE/BANDAGES/DRESSINGS) ×2 IMPLANT
STAPLER VISISTAT 35W (STAPLE) IMPLANT
STRIP CLOSURE SKIN 1/2X4 (GAUZE/BANDAGES/DRESSINGS) ×2 IMPLANT
SUT VIC AB 2-0 CT1 27 (SUTURE) ×1
SUT VIC AB 2-0 CT1 TAPERPNT 27 (SUTURE) ×1 IMPLANT
SUT VICRYL 0 TIES 12 18 (SUTURE) ×2 IMPLANT
SUT VICRYL 4-0 PS2 18IN ABS (SUTURE) IMPLANT
SUT VICRYL AB 2 0 TIES (SUTURE) ×2 IMPLANT
SYR 20ML ECCENTRIC (SYRINGE) IMPLANT
SYR CONTROL 10ML LL (SYRINGE) ×2 IMPLANT
TAPE HYPAFIX 4 X10 (GAUZE/BANDAGES/DRESSINGS) ×2 IMPLANT
TOWEL OR 17X24 6PK STRL BLUE (TOWEL DISPOSABLE) ×2 IMPLANT
TOWEL OR 17X26 10 PK STRL BLUE (TOWEL DISPOSABLE) ×2 IMPLANT
WATER STERILE IRR 1000ML POUR (IV SOLUTION) IMPLANT

## 2011-06-22 NOTE — Interval H&P Note (Signed)
History and Physical Interval Note:  06/22/2011 7:16 AM  Stacey Harris  has presented today for surgery, with the diagnosis of L3-4, L4-5 Stenosis  The various methods of treatment have been discussed with the patient and family. After consideration of risks, benefits and other options for treatment, the patient has consented to  Procedure(s): LUMBAR LAMINECTOMY/DECOMPRESSION MICRODISCECTOMY as a surgical intervention .  The patients' history has been reviewed, patient examined, no change in status, stable for surgery.  I have reviewed the patients' chart and labs.  Questions were answered to the patient's satisfaction.     [name] C  H and P updated no change. Risks of surgery,planned procedure discussed. Op vs non op Tx discussed, all ?'s answered. She understands and agrees to proceed. Canal 5 mm at L4-5 and less than 8 mm at L3-4. Severe stenosis with neurogenic claudication

## 2011-06-22 NOTE — Transfer of Care (Signed)
Immediate Anesthesia Transfer of Care Note  Patient: Stacey Harris  Procedure(s) Performed:  LUMBAR LAMINECTOMY/DECOMPRESSION MICRODISCECTOMY - L3-4, L4-5 Decompression  Patient Location: PACU  Anesthesia Type: General  Level of Consciousness: awake, alert  and oriented  Airway & Oxygen Therapy: Patient Spontanous Breathing and Patient connected to face mask oxygen  Post-op Assessment: Report given to PACU RN  Post vital signs: Reviewed and stable  Complications: No apparent anesthesia complications

## 2011-06-22 NOTE — Preoperative (Signed)
Beta Blockers   Reason not to administer Beta Blockers:Not Applicable 

## 2011-06-22 NOTE — Op Note (Signed)
Op note dictated

## 2011-06-22 NOTE — Interval H&P Note (Signed)
History and Physical Interval Note:  06/22/2011 7:18 AM  Stacey Harris  has presented today for surgery, with the diagnosis of L3-4, L4-5 Stenosis  The various methods of treatment have been discussed with the patient and family. After consideration of risks, benefits and other options for treatment, the patient has consented to  Procedure(s): LUMBAR LAMINECTOMY/DECOMPRESSION MICRODISCECTOMY as a surgical intervention .  The patients' history has been reviewed, patient examined, no change in status, stable for surgery.  I have reviewed the patients' chart and labs.  Questions were answered to the patient's satisfaction.     Tonette Koehne C

## 2011-06-22 NOTE — Op Note (Signed)
Stacey Harris, Stacey Harris                ACCOUNT NO.:  [id number]  MEDICAL RECORD NO.:  [id number]  LOCATION:  [facility]                         FACILITY:  [facility]  PHYSICIAN:  Draysen Weygandt C. Ophelia Charter, M.D.    DATE OF BIRTH:  1929/05/19  DATE OF PROCEDURE:  06/22/2011 DATE OF DISCHARGE:                              OPERATIVE REPORT   PREOPERATIVE DIAGNOSES:  Severe lumbar spinal stenosis at L3-4 and L4-5 with extradural facet cyst at L3-4 and lateral recess stenosis.  PROCEDURES:  L3-4 and L4-5 central decompression, foraminotomies, excision of L3-4 right facet cyst.  SURGEON:  Emran Molzahn C. Ophelia Charter, MD  ASSISTANT:  Wende Neighbors, PA-C, medically necessary and present for the entire procedure.  COMPLICATIONS:  None.  ESTIMATED BLOOD LOSS:  100 mL.  DRAINS:  None.  ANESTHESIA:  GOT plus Marcaine skin local.  BRIEF HISTORY:  This is an 77 year old female with severe stenosis who had failed medical treatment with progressive severe pain, severe stenosis narrowing down to less than 5 mm with the facet cyst at L3-4, multifactorial stenosis with hypertrophic ligamentum.  She had some anterolisthesis 3 mm at L3-4 and 7 mm at L4-5 which is stable on flexion and extension views.  Option discussed with her over decompression and fusion versus decompression alone for severe progressive stenosis with neurogenic claudication, which would not allow her to perform normal activities of daily living, not able to go to the grocery store, and can only ambulate less than 100 feet.  PROCEDURE:  After induction of general anesthesia, orotracheal intubation, and preoperative Ancef prophylaxis, the patient was placed prone on chest rolls, arms at 90/90, rolled yellow pads underneath the shoulders, padding the ulnar nerve, leg pumpers.  Head was elevated some to take pressure off that.  Back was prepped with DuraPrep.  Area was squared with towels.  Sterile skin marker was used for transverse lines and Betadine,  Steri-Drape, and laminectomy sheet and draped.  A time-out procedure was completed.  The images of MRI scan was displayed. Incision was made based on palpable landmarks and plain radiograph was brought in, spinal needle was placed at the level just inferior to the L4-5 disk, expected to be at the mid pedicle level of L5 and the other half of the 18-gauge spinal needle just above the 3/4 disk level.  Cross- table lateral x-ray showed that the inferior needle was little bit low. Subperiosteal dissection on the lamina, deep McCulloch retractor was placed, and posterior elements were removed at the lamina of L3 and L4 and the top of L5 thinning with the bur and second x-ray was taken just for removing the posterior process with Kocher clamps for the planned level of decompression confirming appropriate level.  Hypertrophic ligamentum was removed.  Once the lamina was thinned down, Kerrison was used.  Operative microscope was draped and brought in and a plane was developed between the dura and hypertrophic ligamentum.  Large thick chunks of ligament were removed.  At the 3-4 level on the right, the epidural cyst was scarred down to the dura and titanium microdissection instruments were used to gradually develop a plane preserving the dura without dural tearing so that the facet cyst could  be removed.  With pressure spreading with Kerrison, there was some ganglion-type fluid that squeezed out of the cyst.  Fibrous sac of the cyst was microdissected away from the dura for removal.  There was overhanging facet spurs on both sides and bone was removed out to the level of the pedicle.  The disk at 3-4 was checked on both sides.  There was a broad- based disk bulge; however, with the patient's scoliosis and anterolisthesis, only central decompression was performed and no microdiskectomy with concern for further anterolisthesis.  Pars were intact, preserved.  One area on the right 3/4 next to the cyst  had little bit of venous bleeding.  A small vein attached to the dura and thrombin soaked Gelfoam piece was placed over the patty.  Continued dissection on the left side was performed until the gutter was cleaned. Thick chunks of ligament were removed.  The foramina was enlarged.  4-5 disk was checked on both sides and a hockey stick was used to make sure there was enough decompression distally and proximally.  Proximally, epidural fat was visualized in the top portion of the cyst, was visualized and there was normal dura with epidural fat consistent with the MRI scan.  After irrigation with saline solution, final runs were made in both right and left gutter removing any remaining overhanging spurs to make sure the lateral gutter was smooth.  This was checked again.  The one piece of thrombin Gelfoam was removed.  Both gutters were dry.  Operative field was irrigated again and then standard layered closure with 0 Vicryl in the deep fascia, 2-0 Vicryl subcutaneous tissue, subcuticular Vicryl closure, Marcaine infiltration in the skin, tincture of benzoin, Steri-Strips for postop dressing.  Instrument count and needle count was correct.  Patty count and needle count was correct.     Glorie Dowlen C. Ophelia Charter, M.D.     MCY/MEDQ  D:  06/22/2011  T:  06/22/2011  Job:  [id number]

## 2011-06-22 NOTE — Anesthesia Preprocedure Evaluation (Addendum)
Anesthesia Evaluation  Patient identified by MRN, date of birth, ID band Patient awake    Reviewed: Allergy & Precautions, H&P , NPO status , Patient's Chart, lab work & pertinent test results  Airway Mallampati: II TM Distance: <3 FB Neck ROM: full    Dental  (+) Edentulous Upper, Teeth Intact and Dental Advisory Given   Pulmonary          Cardiovascular hypertension, Pt. on medications     Neuro/Psych    GI/Hepatic   Endo/Other  Diabetes mellitus-Hypothyroidism   Renal/GU      Musculoskeletal   Abdominal   Peds  Hematology   Anesthesia Other Findings   Reproductive/Obstetrics                          Anesthesia Physical Anesthesia Plan  ASA: III  Anesthesia Plan: General   Post-op Pain Management:    Induction: Intravenous  Airway Management Planned: Oral ETT  Additional Equipment:   Intra-op Plan:   Post-operative Plan:   Informed Consent: I have reviewed the patients History and Physical, chart, labs and discussed the procedure including the risks, benefits and alternatives for the proposed anesthesia with the patient or authorized representative who has indicated his/her understanding and acceptance.     Plan Discussed with: CRNA and Surgeon  Anesthesia Plan Comments:         Anesthesia Quick Evaluation

## 2011-06-22 NOTE — Brief Op Note (Signed)
06/22/2011  9:47 AM  PATIENT:  Stacey Harris  77 y.o. female  PRE-OPERATIVE DIAGNOSIS:  L3-4, L4-5 Stenosis  POST-OPERATIVE DIAGNOSIS:  Lumbar three-Lumbar four, Lumbar four-Lumbar five stenosis Extradural cyst at right L3-4  PROCEDURE:  Procedure(s): LUMBAR LAMINECTOMY/DECOMPRESSION MICRODISCECTOMY  SURGEON:  Surgeon(s): Eldred Manges  PHYSICIAN ASSISTANT: Providence Crosby  ASSISTANTS: none   ANESTHESIA:   general  EBL:  Total I/O In: 1000 [I.V.:1000] Out: 50 [Blood:50]  BLOOD ADMINISTERED:none  DRAINS: none   LOCAL MEDICATIONS USED:  MARCAINE 10 CC  SPECIMEN:  No Specimen  DISPOSITION OF SPECIMEN:  N/A  COUNTS:  YES  TOURNIQUET:  * No tourniquets in log *  DICTATION: .Note written in EPIC  PLAN OF CARE: Admit for overnight observation  PATIENT DISPOSITION:  PACU - hemodynamically stable.   Delay start of Pharmacological VTE agent (>24hrs) due to surgical blood loss or risk of bleeding:  {YES/NO/NOT APPLICABLE:20182

## 2011-06-22 NOTE — Progress Notes (Signed)
Patient ID: Stacey Harris, female   DOB: 03-03-1929, 77 y.o.   MRN: [id number]  Pt will receive PT during hospital stay and if progressing well may be discharged to home with recommendations from PT for DME and any other needs tomorrow.  Could possibly need additional day for pain control and medical stability.   Discharge instructions complete in EPIC however she will need final med reconciliation and rx for home. thanks

## 2011-06-22 NOTE — Anesthesia Procedure Notes (Addendum)
Procedure Name: Intubation Date/Time: 06/22/2011 7:44 AM Performed by: De Nurse Pre-anesthesia Checklist: Patient identified, Emergency Drugs available, Suction available, Patient being monitored and Timeout performed Patient Re-evaluated:Patient Re-evaluated prior to inductionOxygen Delivery Method: Circle System Utilized Preoxygenation: Pre-oxygenation with 100% oxygen Intubation Type: IV induction Ventilation: Mask ventilation without difficulty Laryngoscope Size: Mac and 4 Grade View: Grade I Tube type: Oral Tube size: 7.0 mm Number of attempts: 1 Airway Equipment and Method: stylet Placement Confirmation: ETT inserted through vocal cords under direct vision,  positive ETCO2 and breath sounds checked- equal and bilateral Secured at: 21 cm Tube secured with: Tape Dental Injury: Teeth and Oropharynx as per pre-operative assessment  Comments: Laceration to upper gum from bottom teeth during mask ventilation

## 2011-06-22 NOTE — Anesthesia Postprocedure Evaluation (Signed)
Anesthesia Post Note  Patient: Stacey Harris  Procedure(s) Performed:  LUMBAR LAMINECTOMY/DECOMPRESSION MICRODISCECTOMY - L3-4, L4-5 Decompression  Anesthesia type: General  Patient location: PACU  Post pain: Pain level controlled and Adequate analgesia  Post assessment: Post-op Vital signs reviewed, Patient's Cardiovascular Status Stable, Respiratory Function Stable, Patent Airway and Pain level controlled  Last Vitals:  Filed Vitals:   06/22/11 0941  BP: 147/66  Pulse: 64  Temp:   Resp: 16    Post vital signs: Reviewed and stable  Level of consciousness: awake, alert  and oriented  Complications: No apparent anesthesia complications

## 2011-06-23 MED ORDER — HYDROCODONE-ACETAMINOPHEN 5-325 MG PO TABS: 1.0000 | ORAL_TABLET | ORAL | Status: AC | PRN

## 2011-06-23 NOTE — Progress Notes (Signed)
Physical Therapy Evaluation Patient Details Name: Stacey Stacey Harris Stacey Harris MRN: [id number] DOB: 06/11/1929 Today's Date: 06/23/2011  Problem List:  Patient Active Problem List  Diagnoses  . BASAL CELL CARCINOMA, FACE  . DIABETES, TYPE 2  . DIABETIC PERIPHERAL NEUROPATHY  . HYPERLIPIDEMIA  . OBESITY NOS  . HYPERTENSION  . MENOPAUSAL SYNDROME  . ROSACEA  . CORNS AND CALLOSITIES  . OSTEOARTHRITIS, HIP  . INCONTINENCE, URGE  . COLONOSCOPY AND REMOVAL OF LESION, HX OF  . BASAL CELL CARCINOMA, FACE  . BACK PAIN, LUMBAR  . POSTNASAL DRIP  . Anemia, mild  . Lumbar spinal stenosis    Past Medical History:  Past Medical History  Diagnosis Date  . Hyperlipidemia   . Hypertension   . Hypothyroidism   . OA (osteoarthritis)   . Cancer     basal cell skin CA  . Diabetes mellitus     type II  . Blood transfusion    Past Surgical History:  Past Surgical History  Procedure Date  . Tah      and BSO  . Tonsillectomy   . Mohs surgery 12/08    basal cell skin cancer lt temple  . Joint replacement     rt total hip 10  . Ankle fracture surgery 10    rt    PT Assessment/Plan/Recommendation PT Assessment Clinical Impression Statement: 77 yo female s/p back surgery presents with decr independence with functional mobility; will benefit from acute PT services to maximize independence ans safety sieht mobility, reinforce back precautions to enable safe dc home; Overall moving well and should be able to dc home with prn family assist PT Recommendation/Assessment: Patient will need skilled PT in the acute care venue PT Problem List: Decreased balance;Decreased mobility;Decreased knowledge of use of DME;Decreased safety awareness;Decreased knowledge of precautions;Pain PT Therapy Diagnosis : Difficulty walking;Acute pain PT Plan PT Frequency: Min 5X/week PT Treatment/Interventions: DME instruction;Gait training;Stair training;Functional mobility training;Therapeutic activities;Balance  training;Patient/family education PT Recommendation Recommendations for Other Services: OT consult Follow Up Recommendations: Home health PT;24 hour supervision/assistance (intially) Equipment Recommended: 3 in 1 bedside comode (if pt doesn't already have) PT Goals  Acute Rehab PT Goals PT Goal Formulation: With patient Time For Goal Achievement: 7 days Pt will Roll Supine to Right Side: with modified independence PT Goal: Rolling Supine to Right Side - Progress: Progressing toward goal Pt will go Supine/Side to Sit: with modified independence PT Goal: Supine/Side to Sit - Progress: Progressing toward goal Pt will go Sit to Supine/Side: with modified independence Pt will go Sit to Stand: with modified independence PT Goal: Sit to Stand - Progress: Progressing toward goal Pt will go Stand to Sit: with modified independence Pt will Ambulate: >150 feet;with modified independence;with rolling walker PT Goal: Ambulate - Progress: Progressing toward goal Pt will Go Up / Down Stairs: 1-2 stairs;with min assist;with rail(s) Additional Goals Additional Goal #1: Pt will correctly verbalize and utilize back precautions with mobility  PT Evaluation Precautions/Restrictions  Precautions Precautions: Back Restrictions Weight Bearing Restrictions: No Prior Functioning  Home Living Lives With: Alone (Family available prn especially initially) Receives Help From: Family Type of Home: House Home Layout: Two level;Able to live on main level with bedroom/bathroom;Full bath on main level Home Access: Stairs to enter Entrance Stairs-Rails: None Entrance Stairs-Number of Steps: 2 Home Adaptive Equipment: Walker - rolling Prior Function Level of Independence: Independent with homemaking with ambulation Cognition Cognition Arousal/Alertness: Awake/alert Overall Cognitive Status: Appears within functional limits for tasks assessed Orientation Level: Oriented  X4  Sensation/Coordination Sensation Light Touch: Appears Intact Coordination Gross Motor Movements are Fluid and Coordinated: Yes Fine Motor Movements are Fluid and Coordinated: Not tested Extremity Assessment RUE Assessment RUE Assessment: Within Functional Limits LUE Assessment LUE Assessment: Within Functional Limits RLE Assessment RLE Assessment: Within Functional Limits LLE Assessment LLE Assessment: Within Functional Limits Mobility (including Balance) Bed Mobility Bed Mobility: Yes Rolling Right: 4: Min assist Rolling Right Details (indicate cue type and reason): tactile and verbal cues for correct log roll Right Sidelying to Sit: 4: Min assist;With rails;HOB flat Right Sidelying to Sit Details (indicate cue type and reason): cues for correct back presautions Transfers Transfers: Yes Sit to Stand: 4: Min assist;With upper extremity assist;From chair/3-in-1 (with and wothout physical contact) Sit to Stand Details (indicate cue type and reason): cuews for control and back precautions Stand to Sit: 4: Min assist;With upper extremity assist;To chair/3-in-1 Stand to Sit Details: cues for control and back prec Ambulation/Gait Ambulation/Gait: Yes Ambulation/Gait Assistance: 4: Min assist (with and without physical contact) Ambulation/Gait Assistance Details (indicate cue type and reason): noted tends to reach out for UE suport; recommend RWuse for UE support Ambulation Distance (Feet): 120 Feet (times 2) Assistive device: Rolling walker Gait Pattern: Within Functional Limits Stairs: Yes Stairs Assistance: 4: Min assist Stairs Assistance Details (indicate cue type and reason): cues for handheld assist, and sequence Stair Management Technique: One rail Right (to simulate pt's hand hold on R at home) Number of Stairs: 2   Posture/Postural Control Posture/Postural Control: No significant limitations Exercise    End of Session PT - End of Session Activity Tolerance: Patient  tolerated treatment well Patient left: in chair Nurse Communication: Mobility status for transfers;Mobility status for ambulation General Behavior During Session: [name] for tasks performed Cognition: [name] for tasks performed  Van Clines [facility] [place], [place] [phone]  06/23/2011, 9:47 AM

## 2011-06-23 NOTE — Discharge Summary (Signed)
Physician Discharge Summary  Patient ID: Stacey Harris MRN: [id number] DOB/AGE: [birth year] 77 y.o.  Admit date: 06/22/2011 Discharge date: 06/23/2011  Admission Diagnoses:  Lumbar spinal stenosis  Discharge Diagnoses:  Principal Problem:  *Lumbar spinal stenosis   Past Medical History  Diagnosis Date  . Hyperlipidemia   . Hypertension   . Hypothyroidism   . OA (osteoarthritis)   . Cancer     basal cell skin CA  . Diabetes mellitus     type II  . Blood transfusion     Surgeries: Procedure(s): LUMBAR LAMINECTOMY/DECOMPRESSION MICRODISCECTOMY on 06/22/2011   Consultants (if any):    Discharged Condition: Improved  Hospital Course: Stacey Harris is an 77 y.o. female who was admitted 06/22/2011 with a diagnosis of Lumbar spinal stenosis and went to the operating room on 06/22/2011 and underwent the above named procedures.    She was given perioperative antibiotics:  Anti-infectives     Start     Dose/Rate Route Frequency Ordered Stop   06/22/11 1530   ceFAZolin (ANCEF) IVPB 1 g/50 mL premix        1 g 100 mL/hr over 30 Minutes Intravenous 3 times per day 06/22/11 1207 06/22/11 2200   06/21/11 1448   ceFAZolin (ANCEF) IVPB 2 g/50 mL premix        2 g 100 mL/hr over 30 Minutes Intravenous 60 min pre-op 06/21/11 1448 06/22/11 0727        .  She was given sequential compression devices, early ambulation, and chemoprophylaxis for DVT prophylaxis.  They benefited maximally from their hospital stay and there were no complications.    Recent vital signs:  Filed Vitals:   06/23/11 0517  BP: 157/76  Pulse: 64  Temp: 98.8 F (37.1 C)  Resp: 18    Recent laboratory studies:  Lab Results  Component Value Date   HGB 12.7 06/19/2011   HGB 11.8* 05/21/2011   HGB 11.9* 07/08/2009   Lab Results  Component Value Date   WBC 4.9 06/19/2011   PLT 123* 06/19/2011   Lab Results  Component Value Date   INR 1.04 06/19/2011   Lab Results  Component Value Date     NA 142 06/19/2011   K 4.1 06/19/2011   CL 107 06/19/2011   CO2 26 06/19/2011   BUN 16 06/19/2011   CREATININE 0.91 06/19/2011   GLUCOSE 116* 06/19/2011    Discharge Medications:   Current Discharge Medication List    START taking these medications   Details  HYDROcodone-acetaminophen (NORCO) 5-325 MG per tablet Take 1-2 tablets by mouth every 4 (four) hours as needed. Qty: 30 tablet, Refills: 0      CONTINUE these medications which have NOT CHANGED   Details  levothyroxine (SYNTHROID, LEVOTHROID) 50 MCG tablet Take 50 mcg by mouth daily.      metoprolol (TOPROL-XL) 100 MG 24 hr tablet Take 100 mg by mouth daily.      aspirin 81 MG tablet Take 81 mg by mouth daily.      diazepam (VALIUM) 5 MG tablet 1/2 to 1 tablet by mouth as needed at bedtime for insomnia.     ezetimibe (ZETIA) 10 MG tablet Take 10 mg by mouth daily.      folic acid (FOLVITE) 400 MCG tablet Take 400 mcg by mouth daily.      glucose blood test strip Test blood sugar two times daily and as needed.     naproxen sodium (ANAPROX) 220 MG tablet Take 220-440 mg by  mouth daily as needed. For pain.    Omega-3 Fatty Acids (FISH OIL) 1200 MG CAPS Take 1,200 mg by mouth 2 (two) times daily.      pioglitazone (ACTOS) 30 MG tablet      simvastatin (ZOCOR) 20 MG tablet Take 20 mg by mouth at bedtime.      trandolapril (MAVIK) 4 MG tablet Take 1 tablet (4 mg total) by mouth 2 (two) times daily. Qty: 60 tablet, Refills: 11        Diagnostic Studies: Dg Chest 2 View  06/19/2011  *RADIOLOGY REPORT*  Clinical Data: Preop  CHEST - 2 VIEW  Comparison: 04/06/2009  Findings: Cardiomediastinal silhouette is stable.  No acute infiltrate or pulmonary edema.  Stable probable chronic mild interstitial prominence.  Minimal degenerative changes thoracic spine.  IMPRESSION: No active disease.  Stable probable chronic mild interstitial prominence.  Original Report Authenticated By: Natasha Mead, M.D.   Dg Lumbar Spine 2-3  Views  06/22/2011  *RADIOLOGY REPORT*  Clinical Data: l L3-L5 decompression.  LUMBAR SPINE - 2-3 VIEW  Comparison: Lumbar MRI 04/09/2011.  Findings: Two cross-table lateral views of the lumbar spine are submitted postoperatively from the operating room.  Image #1 at 0805 hours demonstrates posterior localizing needles overlying the L3 and L5 spinous processes.  Image #2 at 0815 hours demonstrates blunt surgical instruments between the L2-L3 and between the L4-L5 spinous processes.  IMPRESSION: Intraoperative localization views as described.  Original Report Authenticated By: Gerrianne Scale, M.D.    Disposition:   Discharge Orders    Future Appointments: Provider: Department: Dept Phone: Center:   09/17/2011 8:30 AM Roxy Manns, MD [facility] [phone] [id number]     Future Orders Please Complete By Expires   Diet Carb Modified      Diet - low sodium heart healthy      Call MD / Call 911      Comments:   If you experience chest pain or shortness of breath, CALL 911 and be transported to the hospital emergency room.  If you develope a fever above 101 F, pus (white drainage) or increased drainage or redness at the wound, or calf pain, call your surgeon's office.   Increase activity slowly as tolerated      Discharge instructions      Comments:   Walk as tolerated and advance activity slowly.  No bending lifting or twisting.  Change drsg daily or as needed.  May shower POD# 4 if no wound drainage.  Keep wound covered at all times.  Ice packs to back as needed   Constipation Prevention      Comments:   Drink plenty of fluids.  Prune juice may be helpful.  You may use a stool softener, such as Colace (over the counter) 100 mg twice a day.  Use MiraLax (over the counter) for constipation as needed.   Driving restrictions      Comments:   No driving for 2 weeks   Lifting restrictions      Comments:   No lifting for 4 weeks      Follow-up Information    Follow up with YATES,MARK C.  Make an appointment in 2 weeks.   Contact information:   [facility] [address] [place] [address] [phone]           Signed: Kathryne Hitch 06/23/2011, 9:00 AM

## 2011-06-26 ENCOUNTER — Encounter (HOSPITAL_COMMUNITY): Payer: Self-pay | Admitting: Orthopaedic Surgery

## 2011-07-10 ENCOUNTER — Other Ambulatory Visit: Payer: Self-pay | Admitting: Family Medicine

## 2011-07-11 ENCOUNTER — Other Ambulatory Visit: Payer: Self-pay | Admitting: Family Medicine

## 2011-07-11 ENCOUNTER — Other Ambulatory Visit: Payer: Medicare Other

## 2011-07-11 LAB — FECAL OCCULT BLOOD, IMMUNOCHEMICAL: Fecal Occult Bld: NEGATIVE

## 2011-07-13 ENCOUNTER — Encounter: Payer: Self-pay | Admitting: *Deleted

## 2011-09-14 ENCOUNTER — Other Ambulatory Visit: Payer: Self-pay | Admitting: Family Medicine

## 2011-09-14 NOTE — Telephone Encounter (Signed)
[facility] request levothyroxin 50 mcg #30 x 10.

## 2011-09-14 NOTE — Telephone Encounter (Signed)
[facility] request simvastatin 20 mg #30 x 11 and Zetia 10 mg #30 x 11.

## 2011-09-17 ENCOUNTER — Ambulatory Visit (INDEPENDENT_AMBULATORY_CARE_PROVIDER_SITE_OTHER): Payer: Medicare Other | Admitting: Family Medicine

## 2011-09-17 ENCOUNTER — Encounter: Payer: Self-pay | Admitting: Family Medicine

## 2011-09-17 VITALS — BP 160/82 | HR 72 | Temp 97.7°F | Ht 62.0 in | Wt 198.8 lb

## 2011-09-17 DIAGNOSIS — E669 Obesity, unspecified: Secondary | ICD-10-CM

## 2011-09-17 DIAGNOSIS — E039 Hypothyroidism, unspecified: Secondary | ICD-10-CM

## 2011-09-17 DIAGNOSIS — E119 Type 2 diabetes mellitus without complications: Secondary | ICD-10-CM

## 2011-09-17 DIAGNOSIS — E785 Hyperlipidemia, unspecified: Secondary | ICD-10-CM

## 2011-09-17 DIAGNOSIS — I10 Essential (primary) hypertension: Secondary | ICD-10-CM

## 2011-09-17 LAB — COMPREHENSIVE METABOLIC PANEL
ALT: 24 U/L (ref 0–35)
CO2: 27 mEq/L (ref 19–32)
Calcium: 9.3 mg/dL (ref 8.4–10.5)
Chloride: 109 mEq/L (ref 96–112)
GFR: 63.97 mL/min (ref >60.00)
Sodium: 141 mEq/L (ref 135–145)
Total Bilirubin: 0.4 mg/dL (ref 0.3–1.2)
Total Protein: 6.7 g/dL (ref 6.0–8.3)

## 2011-09-17 MED ORDER — HYDROCHLOROTHIAZIDE 25 MG PO TABS: 25.0000 mg | ORAL_TABLET | Freq: Every day | ORAL | Status: DC

## 2011-09-17 NOTE — Patient Instructions (Signed)
Keep working on [employer] and exercise for weight loss Add hctz ( a fluid pill) each am for high blood pressure Follow up in 4-6 weeks  Lab today

## 2011-09-17 NOTE — Progress Notes (Signed)
Subjective:    Patient ID: Stacey Harris, female    DOB: [birth year], 78 y.o.   MRN: [id number]  HPI Here for f/u of chronic conditions  Wt is about the same   Had her back surgery- went very well and no pain at all   DM2 on actos Diabetes- has been about the same  Home sugar results -- usually 134 in ams , in pm little over 100  DM diet - sticks with this  Exercise - tries to walk 1 mi in the house - proud of them Symptoms-- none at all  A1C last  6.4- great  No problems with medications  Renal protection Mavik Last eye exam was nl in July 2012    Lipids on zocor and diet- no problems at all zetia  Lab Results  Component Value Date   CHOL 111 05/21/2011   HDL 38.50* 05/21/2011   LDLCALC 58 05/21/2011   LDLDIRECT 202.7 08/17/2010   TRIG 74.0 05/21/2011   CHOLHDL 3 05/21/2011   bp is  409/81   Today No cp or palpitations or headaches or edema  No side effects to medicines  - takes her HTN at night    Hypothyroid Lab Results  Component Value Date   TSH 1.94 05/21/2011     Patient Active Problem List  Diagnoses  . BASAL CELL CARCINOMA, FACE  . DIABETES, TYPE 2  . DIABETIC PERIPHERAL NEUROPATHY  . HYPERLIPIDEMIA  . OBESITY NOS  . HYPERTENSION  . MENOPAUSAL SYNDROME  . ROSACEA  . CORNS AND CALLOSITIES  . OSTEOARTHRITIS, HIP  . INCONTINENCE, URGE  . COLONOSCOPY AND REMOVAL OF LESION, HX OF  . BASAL CELL CARCINOMA, FACE  . BACK PAIN, LUMBAR  . POSTNASAL DRIP  . Anemia, mild  . Lumbar spinal stenosis  . Hypothyroid   Past Medical History  Diagnosis Date  . Hyperlipidemia   . Hypertension   . Hypothyroidism   . OA (osteoarthritis)   . Cancer     basal cell skin CA  . Diabetes mellitus     type II  . Blood transfusion    Past Surgical History  Procedure Date  . Tah      and BSO  . Tonsillectomy   . Mohs surgery 12/08    basal cell skin cancer lt temple  . Joint replacement     rt total hip 10  . Ankle fracture surgery 10    rt  . Lumbar  laminectomy/decompression microdiscectomy 06/22/2011    Procedure: LUMBAR LAMINECTOMY/DECOMPRESSION MICRODISCECTOMY;  Surgeon: Eldred Manges;  Location: [facility] OR;  Service: Orthopedics;  Laterality: N/A;  L3-4, L4-5 Decompression   History  Substance Use Topics  . Smoking status: Never Smoker   . Smokeless tobacco: Not on file  . Alcohol Use: No   Family History  Problem Relation Age of Onset  . Arthritis Father     RA  . Hypertension Father   . Heart disease Father     CAD  . Diabetes Father   . Arthritis Sister     RA  . Diabetes Brother   . Diabetes Brother   . Diabetes Brother    Allergies  Allergen Reactions  . Metformin Other (See Comments)    Pt doesn't remember taking medication  . Rosuvastatin Other (See Comments)    REACTION: balance problems   Current Outpatient Prescriptions on File Prior to Visit  Medication Sig Dispense Refill  . aspirin 81 MG tablet Take 81 mg by mouth  daily.        . diazepam (VALIUM) 5 MG tablet 1/2 to 1 tablet by mouth as needed at bedtime for insomnia.       . folic acid (FOLVITE) 400 MCG tablet Take 400 mcg by mouth daily.        [name] glucose blood test strip Test blood sugar two times daily and as needed.       [name] levothyroxine (SYNTHROID, LEVOTHROID) 50 MCG tablet TAKE 1 TABLET BY MOUTH DAILY  30 tablet  10  . metoprolol (TOPROL-XL) 100 MG 24 hr tablet TAKE 1 TABLET EVERY DAY  30 tablet  11  . naproxen sodium (ANAPROX) 220 MG tablet Take 220-440 mg by mouth daily as needed. For pain.      . Omega-3 Fatty Acids (FISH OIL) 1200 MG CAPS Take 1,200 mg by mouth 2 (two) times daily.        . simvastatin (ZOCOR) 20 MG tablet TAKE 1 TABLET BY MOUTH DAILY  30 tablet  11  . trandolapril (MAVIK) 4 MG tablet Take 1 tablet (4 mg total) by mouth 2 (two) times daily.  60 tablet  11  . ZETIA 10 MG tablet TAKE 1 TABLET BY MOUTH DAILY  30 tablet  11     Review of Systems Review of Systems  Constitutional: Negative for fever, appetite change, fatigue and  unexpected weight change.  Eyes: Negative for pain and visual disturbance.  Respiratory: Negative for cough and shortness of breath.   Cardiovascular: Negative for cp or palpitations    Gastrointestinal: Negative for nausea, diarrhea and constipation.  Genitourinary: Negative for urgency and frequency.  Skin: Negative for pallor or rash   Neurological: Negative for weakness, light-headedness, numbness and headaches.  Hematological: Negative for adenopathy. Does not bruise/bleed easily.  Psychiatric/Behavioral: Negative for dysphoric mood. The patient is not nervous/anxious.          Objective:   Physical Exam  Constitutional: She appears well-developed and well-nourished. No distress.       Obese and well appearing   HENT:  Head: Normocephalic and atraumatic.  Mouth/Throat: Oropharynx is clear and moist. No oropharyngeal exudate.  Eyes: Conjunctivae and EOM are normal. Pupils are equal, round, and reactive to light. No scleral icterus.  Neck: Normal range of motion. Neck supple. No JVD present. Carotid bruit is not present. No thyromegaly present.  Cardiovascular: Normal rate, regular rhythm, normal heart sounds and intact distal pulses.  Exam reveals no gallop.   Pulmonary/Chest: Effort normal and breath sounds normal. No respiratory distress. She has no wheezes.  Abdominal: Soft. Bowel sounds are normal. She exhibits no distension, no abdominal bruit and no mass. There is no tenderness.  Musculoskeletal: She exhibits no edema and no tenderness.  Lymphadenopathy:    She has no cervical adenopathy.  Neurological: She is alert. She has normal reflexes. No cranial nerve deficit. She exhibits normal muscle tone. Coordination normal.  Skin: Skin is warm and dry. No rash noted. No erythema. No pallor.  Psychiatric: She has a normal mood and affect.          Assessment & Plan:

## 2011-09-17 NOTE — Assessment & Plan Note (Signed)
Suspect good control per home sugars  Tolerates actos with no side eff utd opthy and nl foot exam today  Lab for a1c Rev low glycemic diet and need for wt loss Since back surg can exercise now

## 2011-09-17 NOTE — Assessment & Plan Note (Signed)
bp is too high for 2nd visit in a row Will add hctz- disc poss side eff  F/u 4-6 wk  Disc imp of wt loss and exercise

## 2011-09-17 NOTE — Assessment & Plan Note (Signed)
Disc goals for lipids and reasons to control them Rev labs with pt- from last draw - on zetia and zocor  Rev low sat fat diet in detail

## 2011-09-17 NOTE — Assessment & Plan Note (Signed)
Discussed how this problem influences overall health and the risks it imposes  Reviewed plan for weight loss with lower calorie diet (via better food choices and also portion control or program like weight watchers) and exercise building up to or more than 30 minutes 5 days per week including some aerobic activity    

## 2011-09-17 NOTE — Assessment & Plan Note (Signed)
Last tsh was theraputic and no symptoms

## 2011-10-29 ENCOUNTER — Encounter: Payer: Self-pay | Admitting: Family Medicine

## 2011-10-29 ENCOUNTER — Ambulatory Visit (INDEPENDENT_AMBULATORY_CARE_PROVIDER_SITE_OTHER): Payer: Medicare Other | Admitting: Family Medicine

## 2011-10-29 VITALS — BP 142/64 | HR 61 | Temp 97.7°F | Ht 62.0 in | Wt 198.5 lb

## 2011-10-29 DIAGNOSIS — I10 Essential (primary) hypertension: Secondary | ICD-10-CM

## 2011-10-29 LAB — RENAL FUNCTION PANEL
Albumin: 3.6 g/dL (ref 3.5–5.2)
BUN: 22 mg/dL (ref 6–23)
Calcium: 9.4 mg/dL (ref 8.4–10.5)
Chloride: 112 mEq/L (ref 96–112)
Phosphorus: 3.4 mg/dL (ref 2.3–4.6)

## 2011-10-29 NOTE — Progress Notes (Signed)
Subjective:    Patient ID: Stacey Harris, female    DOB: 06-16-1929, 78 y.o.   MRN: [id number]  HPI Here for f/u of HTN  Is feeling ok in general Nothing new going on  Started hctz last time- overall much much better  Wt is stale with bmi of 36  Diabetes Home sugar results - are running funny- ? 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  Old , and outdoors more and more active Symptoms- none A1C last  6.7  No problems with medications  Renal protection- is on ace  Last eye exam -normal July 2012   bp is  142/64   Today No cp or palpitations or headaches or edema  No side effects to medicines     Chemistry      Component Value Date/Time   NA 141 09/17/2011 0901   K 4.4 09/17/2011 0901   CL 109 09/17/2011 0901   CO2 27 09/17/2011 0901   BUN 21 09/17/2011 0901   CREATININE 0.9 09/17/2011 0901      Component Value Date/Time   CALCIUM 9.3 09/17/2011 0901   ALKPHOS 64 09/17/2011 0901   AST 32 09/17/2011 0901   ALT 24 09/17/2011 0901   BILITOT 0.4 09/17/2011 0901    Off fluid pill a while - tightness and worse numbness, better now   Patient Active Problem List  Diagnoses  . BASAL CELL CARCINOMA, FACE  . DIABETES, TYPE 2  . DIABETIC PERIPHERAL NEUROPATHY  . HYPERLIPIDEMIA  . OBESITY NOS  . HYPERTENSION  . MENOPAUSAL SYNDROME  . ROSACEA  . CORNS AND CALLOSITIES  . OSTEOARTHRITIS, HIP  . INCONTINENCE, URGE  . COLONOSCOPY AND REMOVAL OF LESION, HX OF  . BASAL CELL CARCINOMA, FACE  . BACK PAIN, LUMBAR  . POSTNASAL DRIP  . Anemia, mild  . Lumbar spinal stenosis  . Hypothyroid   Past Medical History  Diagnosis Date  . Hyperlipidemia   . Hypertension   . Hypothyroidism   . OA (osteoarthritis)   . Cancer     basal cell skin CA  . Diabetes mellitus     type II  . Blood transfusion    Past Surgical History  Procedure Date  . Tah      and BSO  . Tonsillectomy   . Mohs  surgery 12/08    basal cell skin cancer lt temple  . Joint replacement     rt total hip 10  . Ankle fracture surgery 10    rt  . Lumbar laminectomy/decompression microdiscectomy 06/22/2011    Procedure: LUMBAR LAMINECTOMY/DECOMPRESSION MICRODISCECTOMY;  Surgeon: Eldred Manges;  Location: [facility] OR;  Service: Orthopedics;  Laterality: N/A;  L3-4, L4-5 Decompression   History  Substance Use Topics  . Smoking status: Never Smoker   . Smokeless tobacco: Not on file  . Alcohol Use: No   Family History  Problem Relation Age of Onset  . Arthritis Father     RA  . Hypertension Father   . Heart disease Father     CAD  . Diabetes Father   . Arthritis Sister     RA  . Diabetes Brother   . Diabetes Brother   . Diabetes Brother    Allergies  Allergen Reactions  . Metformin Other (See Comments)    Pt doesn't remember taking medication  . Rosuvastatin Other (See Comments)  REACTION: balance problems   Current Outpatient Prescriptions on File Prior to Visit  Medication Sig Dispense Refill  . aspirin 81 MG tablet Take 81 mg by mouth daily.        . folic acid (FOLVITE) 400 MCG tablet Take 400 mcg by mouth daily.        [name] glucose blood test strip Test blood sugar two times daily and as needed.       . hydrochlorothiazide (HYDRODIURIL) 25 MG tablet Take 1 tablet (25 mg total) by mouth daily. In the am  30 tablet  11  . levothyroxine (SYNTHROID, LEVOTHROID) 50 MCG tablet TAKE 1 TABLET BY MOUTH DAILY  30 tablet  10  . metoprolol (TOPROL-XL) 100 MG 24 hr tablet TAKE 1 TABLET EVERY DAY  30 tablet  11  . Omega-3 Fatty Acids (FISH OIL) 1200 MG CAPS Take 1,200 mg by mouth 2 (two) times daily.        . pioglitazone (ACTOS) 30 MG tablet Take 30 mg by mouth daily.      . simvastatin (ZOCOR) 20 MG tablet TAKE 1 TABLET BY MOUTH DAILY  30 tablet  11  . trandolapril (MAVIK) 4 MG tablet Take 1 tablet (4 mg total) by mouth 2 (two) times daily.  60 tablet  11  . ZETIA 10 MG tablet TAKE 1 TABLET BY MOUTH  DAILY  30 tablet  11     Review of Systems Review of Systems  Constitutional: Negative for fever, appetite change, fatigue and unexpected weight change.  Eyes: Negative for pain and visual disturbance.  Respiratory: Negative for cough and shortness of breath.   Cardiovascular: Negative for cp or palpitations    Gastrointestinal: Negative for nausea, diarrhea and constipation.  Genitourinary: Negative for urgency and frequency.  Skin: Negative for pallor or rash   Neurological: Negative for weakness, light-headedness, numbness and headaches.  Hematological: Negative for adenopathy. Does not bruise/bleed easily.  Psychiatric/Behavioral: Negative for dysphoric mood. The patient is not nervous/anxious.          Objective:   Physical Exam  Constitutional: She appears well-developed and well-nourished. No distress.       Obese and well app  HENT:  Head: Normocephalic and atraumatic.  Mouth/Throat: Oropharynx is clear and moist.  Eyes: Conjunctivae and EOM are normal. Pupils are equal, round, and reactive to light. No scleral icterus.  Neck: Normal range of motion. Neck supple. No JVD present. Carotid bruit is not present. No thyromegaly present.  Cardiovascular: Normal rate, regular rhythm, normal heart sounds and intact distal pulses.  Exam reveals no gallop.   Pulmonary/Chest: Effort normal and breath sounds normal. No respiratory distress. She has no wheezes.  Abdominal: She exhibits no abdominal bruit.  Musculoskeletal: She exhibits no edema.  Lymphadenopathy:    She has no cervical adenopathy.  Neurological: She has normal reflexes. Coordination normal.  Skin: Skin is warm and dry. No rash noted. No erythema. No pallor.  Psychiatric: She has a normal mood and affect.          Assessment & Plan:

## 2011-10-29 NOTE — Patient Instructions (Signed)
Labs for electrolytes today  I'm glad the hctz is helping blood pressure Follow up in 6 months with labs prior

## 2011-10-29 NOTE — Assessment & Plan Note (Signed)
Much imp with addn of hctz - and no inc in sugar with that  Lab for renal panel today  Disc need for wt loss/exercise as tol  F/u 6 mo with labs prior

## 2011-10-31 ENCOUNTER — Encounter: Payer: Self-pay | Admitting: *Deleted

## 2011-11-18 ENCOUNTER — Other Ambulatory Visit: Payer: Self-pay | Admitting: Family Medicine

## 2011-12-18 ENCOUNTER — Other Ambulatory Visit: Payer: Self-pay | Admitting: *Deleted

## 2011-12-18 MED ORDER — TRANDOLAPRIL 4 MG PO TABS: 4.0000 mg | ORAL_TABLET | Freq: Two times a day (BID) | ORAL | Status: DC

## 2011-12-18 NOTE — Telephone Encounter (Signed)
Received faxed refill request from pharmacy. Refill sent to pharmacy electronically. 

## 2012-04-16 ENCOUNTER — Telehealth: Payer: Self-pay | Admitting: Family Medicine

## 2012-04-16 DIAGNOSIS — I10 Essential (primary) hypertension: Secondary | ICD-10-CM

## 2012-04-16 DIAGNOSIS — E039 Hypothyroidism, unspecified: Secondary | ICD-10-CM

## 2012-04-16 DIAGNOSIS — E785 Hyperlipidemia, unspecified: Secondary | ICD-10-CM

## 2012-04-16 DIAGNOSIS — E119 Type 2 diabetes mellitus without complications: Secondary | ICD-10-CM

## 2012-04-16 NOTE — Telephone Encounter (Signed)
Message copied by Judy Pimple on Wed Apr 16, 2012 10:39 PM ------      Message from: Alvina Chou      Created: Wed Apr 16, 2012  8:36 AM      Regarding: Lab orders for Monday, 10.14.13       Labs for a 6 month f/u

## 2012-04-21 ENCOUNTER — Other Ambulatory Visit (INDEPENDENT_AMBULATORY_CARE_PROVIDER_SITE_OTHER): Payer: Medicare Other

## 2012-04-21 DIAGNOSIS — E119 Type 2 diabetes mellitus without complications: Secondary | ICD-10-CM

## 2012-04-21 DIAGNOSIS — I10 Essential (primary) hypertension: Secondary | ICD-10-CM

## 2012-04-21 DIAGNOSIS — E039 Hypothyroidism, unspecified: Secondary | ICD-10-CM

## 2012-04-21 DIAGNOSIS — E785 Hyperlipidemia, unspecified: Secondary | ICD-10-CM

## 2012-04-21 LAB — COMPREHENSIVE METABOLIC PANEL
AST: 31 U/L (ref 0–37)
BUN: 26 mg/dL — ABNORMAL HIGH (ref 6–23)
CO2: 28 mEq/L (ref 19–32)
Calcium: 9 mg/dL (ref 8.4–10.5)
Chloride: 109 mEq/L (ref 96–112)
Creatinine, Ser: 1 mg/dL (ref 0.4–1.2)
GFR: 54.67 mL/min — ABNORMAL LOW (ref >60.00)

## 2012-04-21 LAB — LIPID PANEL
Cholesterol: 118 mg/dL (ref 0–200)
Triglycerides: 83 mg/dL (ref 0.0–149.0)

## 2012-04-28 ENCOUNTER — Encounter: Payer: Self-pay | Admitting: Family Medicine

## 2012-04-28 ENCOUNTER — Ambulatory Visit (INDEPENDENT_AMBULATORY_CARE_PROVIDER_SITE_OTHER): Payer: Medicare Other | Admitting: Family Medicine

## 2012-04-28 VITALS — BP 142/70 | HR 74 | Temp 98.0°F | Ht 62.0 in | Wt 200.8 lb

## 2012-04-28 DIAGNOSIS — I10 Essential (primary) hypertension: Secondary | ICD-10-CM

## 2012-04-28 DIAGNOSIS — E039 Hypothyroidism, unspecified: Secondary | ICD-10-CM

## 2012-04-28 DIAGNOSIS — E119 Type 2 diabetes mellitus without complications: Secondary | ICD-10-CM

## 2012-04-28 DIAGNOSIS — E785 Hyperlipidemia, unspecified: Secondary | ICD-10-CM

## 2012-04-28 DIAGNOSIS — Z23 Encounter for immunization: Secondary | ICD-10-CM

## 2012-04-28 NOTE — Progress Notes (Signed)
Subjective:    Patient ID: Stacey Harris, female    DOB: [birth year], 78 y.o.   MRN: [id number]  HPI Here for f/u of chronic medical problems   Wt is up 2 lb with bmi of 36 Obese On a trip recently - hard to eat right  Otherwise does well with diet   bp is stable today  No cp or palpitations or headaches or edema  No side effects to medicines  BP Readings from Last 3 Encounters:  04/28/12 142/70  10/29/11 142/64  09/17/11 160/82      Chemistry      Component Value Date/Time   NA 144 04/21/2012 0855   K 4.4 04/21/2012 0855   CL 109 04/21/2012 0855   CO2 28 04/21/2012 0855   BUN 26* 04/21/2012 0855   CREATININE 1.0 04/21/2012 0855      Component Value Date/Time   CALCIUM 9.0 04/21/2012 0855   ALKPHOS 57 04/21/2012 0855   AST 31 04/21/2012 0855   ALT 21 04/21/2012 0855   BILITOT 0.7 04/21/2012 0855       Diabetes Home sugar results -- are about the same - usually 130s- 140s in am only Thinks she needs a new meter  DM diet  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30* 04/21/2012   HDL 38.50* 05/21/2011   HDL 33.80* 11/16/2010   Lab Results  Component Value Date   LDLCALC 67 04/21/2012   LDLCALC 58 05/21/2011   LDLCALC 64 11/16/2010   Lab Results  Component Value Date   TRIG 83.0 04/21/2012   TRIG 74.0 05/21/2011   TRIG 89.0 11/16/2010     Lab Results  Component Value Date   CHOLHDL 3 04/21/2012   CHOLHDL 3 05/21/2011   CHOLHDL 3 11/16/2010   Lab Results  Component Value Date   LDLDIRECT 202.7 08/17/2010   on zocor and diet  Needs more exercise to raise her HDL   Flu vaccine today  Patient Active Problem List  Diagnosis  . BASAL CELL CARCINOMA, FACE  . DIABETES, TYPE 2  . DIABETIC PERIPHERAL NEUROPATHY  . HYPERLIPIDEMIA  . OBESITY NOS  . HYPERTENSION  . MENOPAUSAL SYNDROME  . ROSACEA  . CORNS AND CALLOSITIES  . OSTEOARTHRITIS, HIP  . INCONTINENCE, URGE  . COLONOSCOPY AND REMOVAL OF LESION, HX OF  . BASAL CELL CARCINOMA, FACE  . BACK PAIN, LUMBAR  . POSTNASAL DRIP  . Anemia, mild  . Lumbar spinal stenosis  . Hypothyroid   Past Medical History  Diagnosis Date  . Hyperlipidemia   . Hypertension   . Hypothyroidism   . OA (osteoarthritis)   . Cancer     basal cell skin CA  . Diabetes mellitus  type II  . Blood transfusion    Past Surgical History  Procedure Date  . Tah      and BSO  . Tonsillectomy   . Mohs surgery 12/08    basal cell skin cancer lt temple  . Joint replacement     rt total hip 10  . Ankle fracture surgery 10    rt  . Lumbar laminectomy/decompression microdiscectomy 06/22/2011    Procedure: LUMBAR LAMINECTOMY/DECOMPRESSION MICRODISCECTOMY;  Surgeon: Eldred Manges;  Location: [facility] OR;  Service: Orthopedics;  Laterality: N/A;  L3-4, L4-5 Decompression   History  Substance Use Topics  . Smoking status: Never Smoker   . Smokeless tobacco: Not on file  . Alcohol Use: No   Family History  Problem Relation Age of Onset  . Arthritis Father     RA  . Hypertension Father   . Heart disease Father     CAD  . Diabetes Father   . Arthritis Sister     RA  . Diabetes Brother   . Diabetes Brother   . Diabetes Brother    Allergies  Allergen Reactions  . Metformin Other (See Comments)    Pt doesn't remember taking medication  . Rosuvastatin Other (See Comments)    REACTION:  balance problems   Current Outpatient Prescriptions on File Prior to Visit  Medication Sig Dispense Refill  . aspirin 81 MG tablet Take 81 mg by mouth daily.        . folic acid (FOLVITE) 400 MCG tablet Take 400 mcg by mouth daily.        [name] glucose blood test strip Test blood sugar two times daily and as needed.       . hydrochlorothiazide (HYDRODIURIL) 25 MG tablet Take 1 tablet (25 mg total) by mouth daily. In the am  30 tablet  11  . levothyroxine (SYNTHROID, LEVOTHROID) 50 MCG tablet TAKE 1 TABLET BY MOUTH DAILY  30 tablet  10  . metoprolol (TOPROL-XL) 100 MG 24 hr tablet TAKE 1 TABLET EVERY DAY  30 tablet  11  . Omega-3 Fatty Acids (FISH OIL) 1200 MG CAPS Take 1,200 mg by mouth 2 (two) times daily.        . pioglitazone (ACTOS) 30 MG tablet Take 30 mg by mouth daily.      . simvastatin (ZOCOR) 20 MG tablet TAKE 1 TABLET BY MOUTH DAILY  30 tablet  11  . trandolapril (MAVIK) 4 MG tablet Take 1 tablet (4 mg total) by mouth 2 (two) times daily.  60 tablet  5  . ZETIA 10 MG tablet TAKE 1 TABLET BY MOUTH DAILY  30 tablet  11  . DISCONTD: pioglitazone (ACTOS) 30 MG tablet TAKE 1 TABLET EVERY DAY  90 tablet  3        Review of Systems Review of Systems  Constitutional: Negative for fever, appetite change, fatigue and unexpected weight change.  Eyes: Negative for pain and visual disturbance.  Respiratory: Negative for cough and shortness of breath.   Cardiovascular: Negative for cp or palpitations    Gastrointestinal: Negative for nausea, diarrhea and constipation.  Genitourinary: Negative for urgency and frequency.  Skin: Negative for pallor or rash   Neurological: Negative for weakness, light-headedness, numbness and headaches.  Hematological: Negative for adenopathy. Does not bruise/bleed easily.  Psychiatric/Behavioral: Negative for dysphoric mood. The patient is not nervous/anxious.         Objective:   Physical Exam  Constitutional: She appears well-developed and  well-nourished. No distress.  obese and well appearing   HENT:  Head: Normocephalic and atraumatic.  Mouth/Throat: Oropharynx is clear and moist.  Eyes: Conjunctivae normal and EOM are normal. Pupils are equal, round, and reactive to light. Right eye exhibits no discharge. Left eye exhibits no discharge.  Neck: Normal range of motion. Neck supple. No JVD present. Carotid bruit is not present. No thyromegaly present.  Cardiovascular: Normal rate, regular rhythm, normal heart sounds and intact distal pulses.  Exam reveals no gallop.   Pulmonary/Chest: Effort normal and breath sounds normal. No respiratory distress. She has no wheezes.  Abdominal: Soft. Bowel sounds are normal. She exhibits no distension, no abdominal bruit and no mass. There is no tenderness.  Musculoskeletal: She exhibits no edema and no tenderness.  Lymphadenopathy:    She has no cervical adenopathy.  Neurological: She is alert. She has normal reflexes. No cranial nerve deficit. She exhibits normal muscle tone. Coordination normal.  Skin: Skin is warm and dry. No rash noted. No erythema. No pallor.  Psychiatric: She has a normal mood and affect.          Assessment & Plan:

## 2012-04-28 NOTE — Patient Instructions (Addendum)
Flu shot today Work on [employer] and weight loss You may need to evaluate your schedule to get some time to take care of yourself  Start checking blood sugar 2 hours after lunch or supper and keep a log  Take your meter to the pharmacy and have them test it to see if it is accurate Follow up with me in about 3 months

## 2012-04-29 NOTE — Assessment & Plan Note (Signed)
Disc goals for lipids and reasons to control them Rev labs with pt Rev low sat fat diet in detail  Continues zocor and zetia currently

## 2012-04-29 NOTE — Assessment & Plan Note (Signed)
Hypothyroidism  Pt has no clinical changes No change in energy level/ hair or skin/ edema and no tremor Lab Results  Component Value Date   TSH 2.63 04/21/2012

## 2012-04-29 NOTE — Assessment & Plan Note (Signed)
a1c is up to 7.1- on actos alone  Pt does not seem very motivated for change and is unable to make health a priority at this time due to a busy schedule I tried to convince her of importance of diet/ exercise / wt loss and taking time for herself to do this  Ref to DM teaching - as she has never had ed before Also enc to check some pp sugars F/u 3 mo  Consider re trial of metformin? If no further imp  She very much wants to stay on actos

## 2012-04-29 NOTE — Assessment & Plan Note (Signed)
bp in fair control at this time  No changes needed  Disc lifstyle change with low sodium diet and exercise  Rev labs with pt today 

## 2012-05-01 ENCOUNTER — Telehealth: Payer: Self-pay | Admitting: Family Medicine

## 2012-05-01 NOTE — Telephone Encounter (Signed)
Called the patient back after giving her the information last week regarding Diabetes education classes. She said to cancel the referral at this time. Wants to see an eye Dr first and when she is ready to go she will call us back to get scheduled. Will cancel the referral in [place].

## 2012-05-26 LAB — HM DIABETES EYE EXAM

## 2012-05-27 ENCOUNTER — Encounter: Payer: Self-pay | Admitting: Family Medicine

## 2012-06-28 ENCOUNTER — Other Ambulatory Visit: Payer: Self-pay | Admitting: Family Medicine

## 2012-07-28 ENCOUNTER — Encounter: Payer: Self-pay | Admitting: Family Medicine

## 2012-07-28 ENCOUNTER — Telehealth: Payer: Self-pay | Admitting: Family Medicine

## 2012-07-28 ENCOUNTER — Ambulatory Visit (INDEPENDENT_AMBULATORY_CARE_PROVIDER_SITE_OTHER): Payer: Medicare Other | Admitting: Family Medicine

## 2012-07-28 VITALS — BP 138/82 | HR 67 | Temp 97.9°F | Ht 62.0 in | Wt 203.0 lb

## 2012-07-28 DIAGNOSIS — E119 Type 2 diabetes mellitus without complications: Secondary | ICD-10-CM

## 2012-07-28 DIAGNOSIS — I10 Essential (primary) hypertension: Secondary | ICD-10-CM

## 2012-07-28 DIAGNOSIS — E785 Hyperlipidemia, unspecified: Secondary | ICD-10-CM

## 2012-07-28 LAB — COMPREHENSIVE METABOLIC PANEL
ALT: 24 U/L (ref 0–35)
Albumin: 3.4 g/dL — ABNORMAL LOW (ref 3.5–5.2)
CO2: 25 mEq/L (ref 19–32)
Chloride: 107 mEq/L (ref 96–112)
GFR: 53.43 mL/min — ABNORMAL LOW (ref >60.00)
Potassium: 4.2 mEq/L (ref 3.5–5.1)
Sodium: 140 mEq/L (ref 135–145)
Total Bilirubin: 0.7 mg/dL (ref 0.3–1.2)
Total Protein: 6.7 g/dL (ref 6.0–8.3)

## 2012-07-28 MED ORDER — ROSUVASTATIN CALCIUM 10 MG PO TABS: 10.0000 mg | ORAL_TABLET | Freq: Every day | ORAL | Status: DC

## 2012-07-28 MED ORDER — METFORMIN HCL 1000 MG PO TABS: 1000.0000 mg | ORAL_TABLET | Freq: Two times a day (BID) | ORAL | Status: DC

## 2012-07-28 NOTE — Telephone Encounter (Signed)
Pt.notified

## 2012-07-28 NOTE — Telephone Encounter (Signed)
Pt called in to let you know the Crestor you asked her to check her insurance about is a Tier III medication. She says it should be ok for you to go ahead fill it. If you have any questions she says you can call her. Thank you.

## 2012-07-28 NOTE — Assessment & Plan Note (Signed)
Pt can no longer afford zetia On simvastain  Will check into the coverage to crestor- would like to change  Disc goals for lipids and reasons to control them Rev labs with pt Rev low sat fat diet in detail

## 2012-07-28 NOTE — Assessment & Plan Note (Signed)
bp in fair control at this time  No changes needed  Disc lifstyle change with low sodium diet and exercise  Lab today 

## 2012-07-28 NOTE — Assessment & Plan Note (Signed)
Pt can no longer afford actos Will stop that and start metformin 1000 bid and update if side eff Check sugar bid  a1c today  Diet and exercise better Will cut portions for wt loss

## 2012-07-28 NOTE — Patient Instructions (Addendum)
Stop actos Start metformin 1000 mg with meals twice daily  If side effects let me know  Check with insurance about crestor and let me know if this if affordable - will change it (if no side effects)  Stop zetia  Keep working hard on diet and exercise  Cut portion sizes for weight loss Check sugar am fasting and 2 hours after eve meal  Follow up in 3 months with labs prior

## 2012-07-28 NOTE — Progress Notes (Signed)
Subjective:    Patient ID: Stacey Harris, female    DOB: May 23, 1929, 79 y.o.   MRN: [id number]  HPI Here for f/u of chronic medical problems  Wt is up 3 lb with bmi of 37 Obese   Diabetes Home sugar results - does not think that the actos -- 150s in ams , and does not take it in the afternoon (lower then)  DM diet - has been very good with diabetes diet - sticks to it even with  Exercise - got back on her bike - and happy with that - was doing it every day 15-20 minutes  Symptoms A1C last  Lab Results  Component Value Date   HGBA1C 7.1* 04/21/2012    No problems with medications -on actos and wants to stay on it  Renal protection-on ace  Last eye exam in nov-no retinopathy (no cataract surgery yet)   bp is stable today  No cp or palpitations or headaches or edema  No side effects to medicines  BP Readings from Last 3 Encounters:  07/28/12 138/82  04/28/12 142/70  10/29/11 142/64       Patient Active Problem List  Diagnosis  . BASAL CELL CARCINOMA, FACE  . DIABETES, TYPE 2  . DIABETIC PERIPHERAL NEUROPATHY  . HYPERLIPIDEMIA  . OBESITY NOS  . HYPERTENSION  . MENOPAUSAL SYNDROME  . ROSACEA  . CORNS AND CALLOSITIES  . OSTEOARTHRITIS, HIP  . INCONTINENCE, URGE  . COLONOSCOPY AND REMOVAL OF LESION, HX OF  . BASAL CELL CARCINOMA, FACE  . BACK PAIN, LUMBAR  . POSTNASAL DRIP  . Anemia, mild  . Lumbar spinal stenosis  . Hypothyroid   Past Medical History  Diagnosis Date  . Hyperlipidemia   . Hypertension   . Hypothyroidism   . OA (osteoarthritis)   . Cancer     basal cell skin CA  . Diabetes mellitus     type II  . Blood transfusion    Past Surgical History  Procedure Date  . Tah      and BSO  . Tonsillectomy   . Mohs surgery 12/08    basal cell skin cancer lt temple  . Joint replacement     rt total hip 10  . Ankle fracture surgery 10    rt  . Lumbar laminectomy/decompression microdiscectomy 06/22/2011    Procedure: LUMBAR  LAMINECTOMY/DECOMPRESSION MICRODISCECTOMY;  Surgeon: Eldred Manges;  Location: [facility] OR;  Service: Orthopedics;  Laterality: N/A;  L3-4, L4-5 Decompression   History  Substance Use Topics  . Smoking status: Never Smoker   . Smokeless tobacco: Not on file  . Alcohol Use: No   Family History  Problem Relation Age of Onset  . Arthritis Father     RA  . Hypertension Father   . Heart disease Father     CAD  . Diabetes Father   . Arthritis Sister     RA  . Diabetes Brother   . Diabetes Brother   . Diabetes Brother    Allergies  Allergen Reactions  . Metformin Other (See Comments)    Pt doesn't remember taking medication  . Rosuvastatin Other (See Comments)    REACTION: balance problems   Current Outpatient Prescriptions on File Prior to Visit  Medication Sig Dispense Refill  . aspirin 81 MG tablet Take 81 mg by mouth daily.        . folic acid (FOLVITE) 400 MCG tablet Take 400 mcg by mouth daily.        [name]  glucose blood test strip Test blood sugar two times daily and as needed.       . hydrochlorothiazide (HYDRODIURIL) 25 MG tablet Take 1 tablet (25 mg total) by mouth daily. In the am  30 tablet  11  . levothyroxine (SYNTHROID, LEVOTHROID) 50 MCG tablet TAKE 1 TABLET BY MOUTH DAILY  30 tablet  10  . metoprolol (TOPROL-XL) 100 MG 24 hr tablet TAKE 1 TABLET EVERY DAY  30 tablet  11  . Omega-3 Fatty Acids (FISH OIL) 1200 MG CAPS Take 1,200 mg by mouth 2 (two) times daily.        . pioglitazone (ACTOS) 30 MG tablet Take 30 mg by mouth daily.      . simvastatin (ZOCOR) 20 MG tablet TAKE 1 TABLET BY MOUTH DAILY  30 tablet  11  . trandolapril (MAVIK) 4 MG tablet TAKE 1 TABLET TWICE A DAY  60 tablet  0  . ZETIA 10 MG tablet TAKE 1 TABLET BY MOUTH DAILY  30 tablet  11     Review of Systems Review of Systems  Constitutional: Negative for fever, appetite change, fatigue and unexpected weight change.  Eyes: Negative for pain and visual disturbance.  Respiratory: Negative for cough and  shortness of breath.   Cardiovascular: Negative for cp or palpitations    Gastrointestinal: Negative for nausea, diarrhea and constipation.  Genitourinary: Negative for urgency and frequency.  Skin: Negative for pallor or rash   Neurological: Negative for weakness, light-headedness, numbness and headaches.  Hematological: Negative for adenopathy. Does not bruise/bleed easily.  Psychiatric/Behavioral: Negative for dysphoric mood. The patient is not nervous/anxious.         Objective:   Physical Exam  Constitutional: She appears well-developed and well-nourished. No distress.       obese and well appearing   HENT:  Head: Normocephalic and atraumatic.  Mouth/Throat: Oropharynx is clear and moist.  Eyes: Conjunctivae normal and EOM are normal. Pupils are equal, round, and reactive to light. Right eye exhibits no discharge. Left eye exhibits no discharge.  Neck: Normal range of motion. Neck supple. No JVD present. Carotid bruit is not present. No thyromegaly present.  Cardiovascular: Normal rate, regular rhythm, normal heart sounds and intact distal pulses.  Exam reveals no gallop.   Pulmonary/Chest: Effort normal and breath sounds normal. No respiratory distress. She has no wheezes.  Abdominal: Soft. Bowel sounds are normal. She exhibits no distension, no abdominal bruit and no mass. There is no tenderness.  Musculoskeletal: She exhibits no edema and no tenderness.  Lymphadenopathy:    She has no cervical adenopathy.  Neurological: She has normal reflexes. No cranial nerve deficit. She exhibits normal muscle tone. Coordination normal.  Skin: Skin is warm and dry. No rash noted. No erythema.  Psychiatric: She has a normal mood and affect.          Assessment & Plan:

## 2012-07-28 NOTE — Telephone Encounter (Signed)
Ok - if any side effects stop it and let me know - I sent it to her pharmacy  Stop the simvastatin (zocor) and zetia

## 2012-07-29 ENCOUNTER — Encounter: Payer: Self-pay | Admitting: *Deleted

## 2012-08-05 ENCOUNTER — Other Ambulatory Visit: Payer: Self-pay | Admitting: Family Medicine

## 2012-08-18 ENCOUNTER — Other Ambulatory Visit: Payer: Self-pay | Admitting: Family Medicine

## 2012-09-28 ENCOUNTER — Other Ambulatory Visit: Payer: Self-pay | Admitting: Family Medicine

## 2012-10-16 ENCOUNTER — Other Ambulatory Visit: Payer: Self-pay | Admitting: Family Medicine

## 2012-10-19 ENCOUNTER — Telehealth: Payer: Self-pay | Admitting: Family Medicine

## 2012-10-19 DIAGNOSIS — E119 Type 2 diabetes mellitus without complications: Secondary | ICD-10-CM

## 2012-10-19 DIAGNOSIS — E785 Hyperlipidemia, unspecified: Secondary | ICD-10-CM

## 2012-10-19 DIAGNOSIS — E039 Hypothyroidism, unspecified: Secondary | ICD-10-CM

## 2012-10-19 DIAGNOSIS — I10 Essential (primary) hypertension: Secondary | ICD-10-CM

## 2012-10-19 NOTE — Telephone Encounter (Signed)
Message copied by Judy Pimple on Sun Oct 19, 2012  6:18 PM ------      Message from: Baldomero Lamy      Created: Thu Oct 09, 2012  1:46 PM      Regarding: 3 mo f/u labs Mon 4/14       Please order  future f/u labs for pt's upcomming lab appt.      Thanks      Tasha             ------

## 2012-10-20 ENCOUNTER — Other Ambulatory Visit (INDEPENDENT_AMBULATORY_CARE_PROVIDER_SITE_OTHER): Payer: Medicare Other

## 2012-10-20 DIAGNOSIS — E039 Hypothyroidism, unspecified: Secondary | ICD-10-CM

## 2012-10-20 DIAGNOSIS — E119 Type 2 diabetes mellitus without complications: Secondary | ICD-10-CM

## 2012-10-20 DIAGNOSIS — E785 Hyperlipidemia, unspecified: Secondary | ICD-10-CM

## 2012-10-20 DIAGNOSIS — I10 Essential (primary) hypertension: Secondary | ICD-10-CM

## 2012-10-20 LAB — COMPREHENSIVE METABOLIC PANEL
ALT: 20 U/L (ref 0–35)
AST: 28 U/L (ref 0–37)
Albumin: 3.4 g/dL — ABNORMAL LOW (ref 3.5–5.2)
Alkaline Phosphatase: 68 U/L (ref 39–117)
Calcium: 9.1 mg/dL (ref 8.4–10.5)
Chloride: 107 mEq/L (ref 96–112)
Potassium: 4.1 mEq/L (ref 3.5–5.1)
Sodium: 141 mEq/L (ref 135–145)

## 2012-10-20 LAB — LIPID PANEL
LDL Cholesterol: 58 mg/dL (ref 0–99)
Total CHOL/HDL Ratio: 4
VLDL: 21.8 mg/dL (ref 0.0–40.0)

## 2012-10-22 ENCOUNTER — Other Ambulatory Visit: Payer: Self-pay | Admitting: Family Medicine

## 2012-10-27 ENCOUNTER — Encounter: Payer: Self-pay | Admitting: Family Medicine

## 2012-10-27 ENCOUNTER — Ambulatory Visit (INDEPENDENT_AMBULATORY_CARE_PROVIDER_SITE_OTHER): Payer: Medicare Other | Admitting: Family Medicine

## 2012-10-27 VITALS — BP 144/74 | HR 62 | Temp 98.4°F | Ht 62.0 in | Wt 182.5 lb

## 2012-10-27 DIAGNOSIS — E119 Type 2 diabetes mellitus without complications: Secondary | ICD-10-CM

## 2012-10-27 DIAGNOSIS — I10 Essential (primary) hypertension: Secondary | ICD-10-CM

## 2012-10-27 DIAGNOSIS — E785 Hyperlipidemia, unspecified: Secondary | ICD-10-CM

## 2012-10-27 NOTE — Assessment & Plan Note (Signed)
Lipids are well controlled with crestor-but HDL low  Disc imp of exercise for this and omega fatty acids Will continue to watch

## 2012-10-27 NOTE — Assessment & Plan Note (Signed)
Stable here- per pt labile Urged her to check at diff times while relaxed  bp in fair control at this time  No changes needed  Disc lifstyle change with low sodium diet and exercise

## 2012-10-27 NOTE — Patient Instructions (Addendum)
Keep up the great job with weight loss Keep watching diet  Also try to get some more exercise  No change in medicines  Follow up in about 3 months with labs prior

## 2012-10-27 NOTE — Assessment & Plan Note (Signed)
DM slt improved on metformin 21 lb wt loss off actos and with better diet and exercise  Urged her to keep it up  F/u 3 mo with lab prior-goal for a1c below 7

## 2012-10-27 NOTE — Progress Notes (Signed)
Subjective:    Patient ID: Stacey Harris, female    DOB: 1928/12/07, 79 y.o.   MRN: [id number]  HPI Here for f/u of chronic medical problems  bp is stable today  No cp or palpitations or headaches or edema  No side effects to medicines  BP Readings from Last 3 Encounters:  10/27/12 144/74  07/28/12 138/82  04/28/12 142/70     Wt is down significantly 21 lb- is happy about that - she has worked hard at it  Also off actos  bmi is 33  Off actos and on metformin now for DM Lab Results  Component Value Date   HGBA1C 7.2* 10/20/2012   this is down from 7.3 Is a little nauseated on metformin- it is tolerable     Chemistry      Component Value Date/Time   NA 141 10/20/2012 0915   K 4.1 10/20/2012 0915   CL 107 10/20/2012 0915   CO2 24 10/20/2012 0915   BUN 30* 10/20/2012 0915   CREATININE 1.1 10/20/2012 0915      Component Value Date/Time   CALCIUM 9.1 10/20/2012 0915   ALKPHOS 68 10/20/2012 0915   AST 28 10/20/2012 0915   ALT 20 10/20/2012 0915   BILITOT 0.5 10/20/2012 0915       Lipid On crestor and diet  Lab Results  Component Value Date   CHOL 105 10/20/2012   CHOL 118 04/21/2012   CHOL 111 05/21/2011   Lab Results  Component Value Date   HDL 25.00* 10/20/2012   HDL 34.30* 04/21/2012   HDL 38.50* 05/21/2011   Lab Results  Component Value Date   LDLCALC 58 10/20/2012   LDLCALC 67 04/21/2012   LDLCALC 58 05/21/2011   Lab Results  Component Value Date   TRIG 109.0 10/20/2012   TRIG 83.0 04/21/2012   TRIG 74.0 05/21/2011   Lab Results  Component Value Date   CHOLHDL 4 10/20/2012   CHOLHDL 3 04/21/2012   CHOLHDL 3 05/21/2011   Lab Results  Component Value Date   LDLDIRECT 202.7 08/17/2010     Patient Active Problem List  Diagnosis  . BASAL CELL CARCINOMA, FACE  . DIABETES, TYPE 2  . DIABETIC PERIPHERAL NEUROPATHY  . HYPERLIPIDEMIA  . OBESITY NOS  . HYPERTENSION  . MENOPAUSAL SYNDROME  . ROSACEA  . CORNS AND CALLOSITIES  . OSTEOARTHRITIS, HIP  .  INCONTINENCE, URGE  . COLONOSCOPY AND REMOVAL OF LESION, HX OF  . BASAL CELL CARCINOMA, FACE  . BACK PAIN, LUMBAR  . POSTNASAL DRIP  . Anemia, mild  . Lumbar spinal stenosis  . Hypothyroid   Past Medical History  Diagnosis Date  . Hyperlipidemia   . Hypertension   . Hypothyroidism   . OA (osteoarthritis)   . Cancer     basal cell skin CA  . Diabetes mellitus     type II  . Blood transfusion    Past Surgical History  Procedure Laterality Date  . Tah       and BSO  . Tonsillectomy    . Mohs surgery  12/08    basal cell skin cancer lt temple  . Joint replacement      rt total hip 10  . Ankle fracture surgery  10    rt  . Lumbar laminectomy/decompression microdiscectomy  06/22/2011    Procedure: LUMBAR LAMINECTOMY/DECOMPRESSION MICRODISCECTOMY;  Surgeon: Eldred Manges;  Location: [facility] OR;  Service: Orthopedics;  Laterality: N/A;  L3-4, L4-5 Decompression   History  Substance Use Topics  . Smoking status: Never Smoker   . Smokeless tobacco: Not on file  . Alcohol Use: No   Family History  Problem Relation Age of Onset  . Arthritis Father     RA  . Hypertension Father   . Heart disease Father     CAD  . Diabetes Father   . Arthritis Sister     RA  . Diabetes Brother   . Diabetes Brother   . Diabetes Brother    No Known Allergies Current Outpatient Prescriptions on File Prior to Visit  Medication Sig Dispense Refill  . aspirin 81 MG tablet Take 81 mg by mouth daily.        [name] glucose blood test strip Test blood sugar two times daily and as needed.       . hydrochlorothiazide (HYDRODIURIL) 25 MG tablet TAKE 1 TABLET BY MOUTH EVERY MORNING  30 tablet  0  . levothyroxine (SYNTHROID, LEVOTHROID) 50 MCG tablet TAKE 1 TABLET BY MOUTH DAILY  30 tablet  0  . metFORMIN (GLUCOPHAGE) 1000 MG tablet Take 1 tablet (1,000 mg total) by mouth 2 (two) times daily with a meal.  60 tablet  11  . metoprolol succinate (TOPROL-XL) 100 MG 24 hr tablet TAKE 1 TABLET EVERY DAY  30 tablet   2  . Omega-3 Fatty Acids (FISH OIL) 1200 MG CAPS Take 1,200 mg by mouth 2 (two) times daily.        . rosuvastatin (CRESTOR) 10 MG tablet Take 1 tablet (10 mg total) by mouth daily.  30 tablet  5  . trandolapril (MAVIK) 4 MG tablet TAKE 1 TABLET TWICE A DAY  60 tablet  0   No current facility-administered medications on file prior to visit.    Review of Systems Review of Systems  Constitutional: Negative for fever, appetite change, fatigue and unexpected weight change.  Eyes: Negative for pain and visual disturbance.  Respiratory: Negative for cough and shortness of breath.   Cardiovascular: Negative for cp or palpitations    Gastrointestinal: Negative for nausea, diarrhea and constipation.  Genitourinary: Negative for urgency and frequency.  Skin: Negative for pallor or rash   MSK pos for aches and pains -improved with wt loss  Neurological: Negative for weakness, light-headedness, numbness and headaches.  Hematological: Negative for adenopathy. Does not bruise/bleed easily.  Psychiatric/Behavioral: Negative for dysphoric mood. The patient is not nervous/anxious.         Objective:   Physical Exam  Constitutional: She appears well-developed and well-nourished. No distress.  obese and well appearing   HENT:  Head: Normocephalic and atraumatic.  Mouth/Throat: Oropharynx is clear and moist.  Eyes: Conjunctivae and EOM are normal. Pupils are equal, round, and reactive to light. Right eye exhibits no discharge. Left eye exhibits no discharge. No scleral icterus.  Neck: Normal range of motion. Neck supple. No JVD present. Carotid bruit is not present. No thyromegaly present.  Cardiovascular: Normal rate, regular rhythm and intact distal pulses.   Pulmonary/Chest: Effort normal and breath sounds normal. No respiratory distress. She has no wheezes.  Abdominal: Soft. Bowel sounds are normal. She exhibits no distension, no abdominal bruit and no mass. There is no tenderness.   Musculoskeletal: She exhibits no edema.  Lymphadenopathy:    She has no cervical adenopathy.  Neurological: She is alert. She has normal reflexes.  Skin: Skin is warm and dry. No rash noted.  Psychiatric: She has a normal mood and affect.  Assessment & Plan:

## 2012-11-01 ENCOUNTER — Other Ambulatory Visit: Payer: Self-pay | Admitting: Family Medicine

## 2012-11-17 ENCOUNTER — Other Ambulatory Visit: Payer: Self-pay | Admitting: Family Medicine

## 2012-12-03 ENCOUNTER — Other Ambulatory Visit: Payer: Self-pay | Admitting: Family Medicine

## 2012-12-04 ENCOUNTER — Other Ambulatory Visit: Payer: Self-pay | Admitting: Family Medicine

## 2013-01-22 ENCOUNTER — Other Ambulatory Visit (INDEPENDENT_AMBULATORY_CARE_PROVIDER_SITE_OTHER): Payer: Medicare Other

## 2013-01-22 DIAGNOSIS — E119 Type 2 diabetes mellitus without complications: Secondary | ICD-10-CM

## 2013-01-22 DIAGNOSIS — I10 Essential (primary) hypertension: Secondary | ICD-10-CM

## 2013-01-22 LAB — COMPREHENSIVE METABOLIC PANEL
Albumin: 3.5 g/dL (ref 3.5–5.2)
Alkaline Phosphatase: 65 U/L (ref 39–117)
BUN: 29 mg/dL — ABNORMAL HIGH (ref 6–23)
Glucose, Bld: 127 mg/dL — ABNORMAL HIGH (ref 70–99)
Potassium: 4.3 mEq/L (ref 3.5–5.1)
Total Bilirubin: 0.6 mg/dL (ref 0.3–1.2)

## 2013-01-22 LAB — HEMOGLOBIN A1C: Hgb A1c MFr Bld: 7 % — ABNORMAL HIGH (ref 4.6–6.5)

## 2013-01-23 ENCOUNTER — Other Ambulatory Visit: Payer: Self-pay | Admitting: Family Medicine

## 2013-01-26 ENCOUNTER — Encounter: Payer: Self-pay | Admitting: Family Medicine

## 2013-01-26 ENCOUNTER — Ambulatory Visit (INDEPENDENT_AMBULATORY_CARE_PROVIDER_SITE_OTHER): Payer: Medicare Other | Admitting: Family Medicine

## 2013-01-26 VITALS — BP 132/74 | HR 64 | Temp 98.4°F | Ht 62.0 in | Wt 172.8 lb

## 2013-01-26 DIAGNOSIS — T63461A Toxic effect of venom of wasps, accidental (unintentional), initial encounter: Secondary | ICD-10-CM

## 2013-01-26 DIAGNOSIS — T63441A Toxic effect of venom of bees, accidental (unintentional), initial encounter: Secondary | ICD-10-CM | POA: Insufficient documentation

## 2013-01-26 DIAGNOSIS — I10 Essential (primary) hypertension: Secondary | ICD-10-CM

## 2013-01-26 DIAGNOSIS — E785 Hyperlipidemia, unspecified: Secondary | ICD-10-CM

## 2013-01-26 DIAGNOSIS — T6391XA Toxic effect of contact with unspecified venomous animal, accidental (unintentional), initial encounter: Secondary | ICD-10-CM

## 2013-01-26 DIAGNOSIS — E119 Type 2 diabetes mellitus without complications: Secondary | ICD-10-CM

## 2013-01-26 DIAGNOSIS — G4700 Insomnia, unspecified: Secondary | ICD-10-CM | POA: Insufficient documentation

## 2013-01-26 MED ORDER — DIAZEPAM 5 MG PO TABS: 2.5000 mg | ORAL_TABLET | Freq: Every evening | ORAL | Status: DC | PRN

## 2013-01-26 NOTE — Assessment & Plan Note (Signed)
bp in fair control at this time  No changes needed  Disc lifstyle change with low sodium diet and exercise  Lab reviewed  

## 2013-01-26 NOTE — Progress Notes (Signed)
Subjective:    Patient ID: Stacey Harris, female    DOB: 21-Feb-1929, 79 y.o.   MRN: [id number]  HPI Here for f/u of chronic medical problems  Wt is down another 10 lb with bmi of 31 That is great !- she is staying active and eating right - doing well   Diabetes Home sugar results - they are doing better - usually fasting 130 or below (lower in afternoons) DM diet - better  Exercise - she takes care of great grandson and cares for garden and house- very very active  Symptoms A1C last  Lab Results  Component Value Date   HGBA1C 7.0* 01/22/2013    No problems with medications  Renal protection on ace Last eye exam was 11/13  bp is stable today  No cp or palpitations or headaches or edema  No side effects to medicines  BP Readings from Last 3 Encounters:  01/26/13 132/74  10/27/12 144/74  07/28/12 138/82      Hyperlipidemia Lab Results  Component Value Date   CHOL 105 10/20/2012   HDL 25.00* 10/20/2012   LDLCALC 58 10/20/2012   LDLDIRECT 202.7 08/17/2010   TRIG 109.0 10/20/2012   CHOLHDL 4 10/20/2012   on crestor and diet   Has some yellow jacket stings on arms Wants to know what to do for itch Getting better  Using otc cortisone cream Mild redness  Needs refill of diazepam for sleep-- uses it very infrequenly   (1/2 of the 5 mg ) Has not taken any in a year  Understands the risks     Patient Active Problem List   Diagnosis Date Noted  . Insomnia 01/26/2013  . Bee sting reaction 01/26/2013  . Hypothyroid 09/17/2011  . Lumbar spinal stenosis 06/21/2011    Class: Present on Admission  . Anemia, mild 05/25/2011  . BACK PAIN, LUMBAR 08/23/2010  . POSTNASAL DRIP 08/23/2010  . OSTEOARTHRITIS, HIP 03/16/2009  . MENOPAUSAL SYNDROME 12/08/2008  . CORNS AND CALLOSITIES 08/13/2007  . BASAL CELL CARCINOMA, FACE 07/01/2007  . BASAL CELL CARCINOMA, FACE 07/01/2007  . DIABETES, TYPE 2 09/24/2006  . DIABETIC PERIPHERAL NEUROPATHY 09/24/2006  . HYPERLIPIDEMIA 09/24/2006   . OBESITY NOS 09/24/2006  . HYPERTENSION 09/24/2006  . ROSACEA 09/24/2006  . INCONTINENCE, URGE 09/24/2006  . COLONOSCOPY AND REMOVAL OF LESION, HX OF 06/09/2003   Past Medical History  Diagnosis Date  . Hyperlipidemia   . Hypertension   . Hypothyroidism   . OA (osteoarthritis)   . Cancer     basal cell skin CA  . Diabetes mellitus     type II  . Blood transfusion    Past Surgical History  Procedure Laterality Date  . [place]       and BSO  . Tonsillectomy    . Mohs surgery  12/08    basal cell skin cancer lt temple  . Joint replacement      rt total hip 10  . Ankle fracture surgery  10    rt  . Lumbar laminectomy/decompression microdiscectomy  06/22/2011    Procedure: LUMBAR LAMINECTOMY/DECOMPRESSION MICRODISCECTOMY;  Surgeon: Eldred Manges;  Location: [facility] OR;  Service: Orthopedics;  Laterality: N/A;  L3-4, L4-5 Decompression   History  Substance Use Topics  . Smoking status: Never Smoker   . Smokeless tobacco: Not on file  . Alcohol Use: No   Family History  Problem Relation Age of Onset  . Arthritis Father     RA  . Hypertension Father   .  Heart disease Father     CAD  . Diabetes Father   . Arthritis Sister     RA  . Diabetes Brother   . Diabetes Brother   . Diabetes Brother    No Known Allergies Current Outpatient Prescriptions on File Prior to Visit  Medication Sig Dispense Refill  . aspirin 81 MG tablet Take 81 mg by mouth daily.        . CRESTOR 10 MG tablet TAKE 1 TABLET BY MOUTH DAILY  30 tablet  5  . glucose blood test strip Test blood sugar two times daily and as needed.       . hydrochlorothiazide (HYDRODIURIL) 25 MG tablet TAKE 1 TABLET BY MOUTH EVERY MORNING  30 tablet  5  . levothyroxine (SYNTHROID, LEVOTHROID) 50 MCG tablet TAKE 1 TABLET BY MOUTH DAILY  30 tablet  2  . metFORMIN (GLUCOPHAGE) 1000 MG tablet Take 1 tablet (1,000 mg total) by mouth 2 (two) times daily with a meal.  60 tablet  11  . metoprolol succinate (TOPROL-XL) 100 MG 24 hr  tablet TAKE 1 TABLET EVERY DAY  30 tablet  5  . Omega-3 Fatty Acids (FISH OIL) 1200 MG CAPS Take 1,200 mg by mouth 2 (two) times daily.        . trandolapril (MAVIK) 4 MG tablet TAKE 1 TABLET TWICE A DAY  60 tablet  3   No current facility-administered medications on file prior to visit.    Review of Systems Review of Systems  Constitutional: Negative for fever, appetite change, fatigue and unexpected weight change.  Eyes: Negative for pain and visual disturbance.  Respiratory: Negative for cough and shortness of breath.   Cardiovascular: Negative for cp or palpitations    Gastrointestinal: Negative for nausea, diarrhea and constipation.  Genitourinary: Negative for urgency and frequency.  Skin: Negative for pallor and pos for itching/ redness on arms from bee stings   Neurological: Negative for weakness, light-headedness, numbness and headaches. pos for "cold feet" Hematological: Negative for adenopathy. Does not bruise/bleed easily.  Psychiatric/Behavioral: Negative for dysphoric mood. The patient is not nervous/anxious.         Objective:   Physical Exam  Constitutional: She appears well-developed and well-nourished. No distress.  obese and well appearing   HENT:  Head: Normocephalic and atraumatic.  Mouth/Throat: Oropharynx is clear and moist.  Eyes: Conjunctivae and EOM are normal. Pupils are equal, round, and reactive to light. Right eye exhibits no discharge. Left eye exhibits no discharge. No scleral icterus.  Neck: Normal range of motion. Neck supple. No JVD present. No thyromegaly present.  Cardiovascular: Normal rate, regular rhythm, normal heart sounds and intact distal pulses.  Exam reveals no gallop.   Pulmonary/Chest: Effort normal and breath sounds normal. No respiratory distress. She has no wheezes. She has no rales.  Abdominal: Soft. Bowel sounds are normal. She exhibits no distension and no mass. There is no tenderness.  Musculoskeletal: Normal range of motion. She  exhibits no edema and no tenderness.  Lymphadenopathy:    She has no cervical adenopathy.  Neurological: She is alert. She has normal reflexes. No cranial nerve deficit. She exhibits normal muscle tone. Coordination normal.  Skin: Skin is warm and dry. No rash noted. There is erythema. No pallor.  Some patchy erythema on upper arms -worse on L from bee stings with mild induration and no drainage          Assessment & Plan:

## 2013-01-26 NOTE — Assessment & Plan Note (Signed)
Pt uses diazepam for very rare use - refilled today with caution of sedation/ falls

## 2013-01-26 NOTE — Assessment & Plan Note (Signed)
Overall improved with  Lab Results  Component Value Date   HGBA1C 7.0* 01/22/2013    Also wt loss / better diet  Rev labs Rev diet and exercise

## 2013-01-26 NOTE — Assessment & Plan Note (Signed)
Recommend cold compresses and otc cortisone cream for itching  Also antihistamine otc prn  Adv to f/u if inc in redness/ swelling or itching

## 2013-01-26 NOTE — Assessment & Plan Note (Signed)
Disc goals for lipids and reasons to control them Rev labs with pt Rev low sat fat diet in detail   

## 2013-01-26 NOTE — Patient Instructions (Addendum)
Continue current medicines  For the bee stings - use a cold compress and take claritin over the counter 10 mg daily for itch as needed  Use diazepam for sleep very infrequently and with great caution  Follow up in 6 months for annual exam with labs prior

## 2013-02-01 ENCOUNTER — Other Ambulatory Visit: Payer: Self-pay | Admitting: Family Medicine

## 2013-02-02 ENCOUNTER — Other Ambulatory Visit: Payer: Self-pay | Admitting: Family Medicine

## 2013-03-23 ENCOUNTER — Telehealth: Payer: Self-pay

## 2013-03-23 NOTE — Telephone Encounter (Signed)
Pt said her insurance requires pt to use [facility] for diabetic supplies; [facility] not [facility] the Fore meter strips which is the type blood sugar meter pt has. Pt to ck with insurance co to see which glucose meters are covered and then will ck with [facility] to make sure they carry that type meter. Pt will cb at later time with info.

## 2013-04-02 MED ORDER — ONETOUCH ULTRA MINI W/DEVICE KIT: PACK | Status: DC

## 2013-04-02 MED ORDER — GLUCOSE BLOOD VI STRP: ORAL_STRIP | Status: DC

## 2013-04-02 NOTE — Addendum Note (Signed)
Addended by: Patience Musca on: 04/02/2013 10:06 AM   Modules accepted: Orders

## 2013-04-02 NOTE — Telephone Encounter (Signed)
Pt called back and request one touch ultra mini glucose meter and test strips to [facility]. Spoke with Stanton Kidney at [facility] and advised one touch ultra mini is only meter they have and one touch ultra test strip is appropriate strip. Pt notified done. Pt said only cking BS once a day.

## 2013-05-14 ENCOUNTER — Other Ambulatory Visit: Payer: Self-pay | Admitting: Family Medicine

## 2013-05-14 MED ORDER — TRANDOLAPRIL 4 MG PO TABS: ORAL_TABLET | ORAL | Status: DC

## 2013-05-21 ENCOUNTER — Ambulatory Visit (INDEPENDENT_AMBULATORY_CARE_PROVIDER_SITE_OTHER): Payer: Medicare Other

## 2013-05-21 DIAGNOSIS — Z23 Encounter for immunization: Secondary | ICD-10-CM

## 2013-05-22 ENCOUNTER — Other Ambulatory Visit: Payer: Self-pay | Admitting: Family Medicine

## 2013-06-02 ENCOUNTER — Other Ambulatory Visit: Payer: Self-pay | Admitting: Family Medicine

## 2013-07-13 ENCOUNTER — Telehealth: Payer: Self-pay | Admitting: *Deleted

## 2013-07-13 NOTE — Telephone Encounter (Signed)
Form for diabetic testing supplies in your IN box for completion. 

## 2013-07-13 NOTE — Telephone Encounter (Signed)
Done and in IN box 

## 2013-07-14 NOTE — Telephone Encounter (Signed)
Form faxed

## 2013-07-22 ENCOUNTER — Telehealth: Payer: Self-pay | Admitting: Family Medicine

## 2013-07-22 DIAGNOSIS — E785 Hyperlipidemia, unspecified: Secondary | ICD-10-CM

## 2013-07-22 DIAGNOSIS — E119 Type 2 diabetes mellitus without complications: Secondary | ICD-10-CM

## 2013-07-22 DIAGNOSIS — E039 Hypothyroidism, unspecified: Secondary | ICD-10-CM

## 2013-07-22 DIAGNOSIS — I10 Essential (primary) hypertension: Secondary | ICD-10-CM

## 2013-07-22 NOTE — Telephone Encounter (Signed)
Message copied by [name] on Wed Jul 22, 2013  7:19 PM ------      Message from: Ellamae Sia      Created: Tue Jul 21, 2013  5:19 PM      Regarding: Lab orders for Thursday, 1.15.15       Patient is scheduled for CPX labs, please order future labs, Thanks , Terri       ------

## 2013-07-23 ENCOUNTER — Other Ambulatory Visit (INDEPENDENT_AMBULATORY_CARE_PROVIDER_SITE_OTHER): Payer: Medicare HMO

## 2013-07-23 DIAGNOSIS — E119 Type 2 diabetes mellitus without complications: Secondary | ICD-10-CM

## 2013-07-23 DIAGNOSIS — I10 Essential (primary) hypertension: Secondary | ICD-10-CM

## 2013-07-23 DIAGNOSIS — E785 Hyperlipidemia, unspecified: Secondary | ICD-10-CM

## 2013-07-23 DIAGNOSIS — D649 Anemia, unspecified: Secondary | ICD-10-CM

## 2013-07-23 DIAGNOSIS — E039 Hypothyroidism, unspecified: Secondary | ICD-10-CM

## 2013-07-23 LAB — LIPID PANEL
Cholesterol: 110 mg/dL (ref 0–200)
HDL: 35.5 mg/dL — ABNORMAL LOW
LDL Cholesterol: 55 mg/dL (ref 0–99)
Total CHOL/HDL Ratio: 3
Triglycerides: 99 mg/dL (ref 0.0–149.0)
VLDL: 19.8 mg/dL (ref 0.0–40.0)

## 2013-07-23 LAB — CBC WITH DIFFERENTIAL/PLATELET
Basophils Absolute: 0 10*3/uL (ref 0.0–0.1)
Basophils Relative: 0.5 % (ref 0.0–3.0)
Eosinophils Absolute: 0.1 10*3/uL (ref 0.0–0.7)
Eosinophils Relative: 2 % (ref 0.0–5.0)
HCT: 34.8 % — ABNORMAL LOW (ref 36.0–46.0)
Hemoglobin: 11.7 g/dL — ABNORMAL LOW (ref 12.0–15.0)
Lymphocytes Relative: 34.9 % (ref 12.0–46.0)
Lymphs Abs: 2.1 10*3/uL (ref 0.7–4.0)
MCHC: 33.6 g/dL (ref 30.0–36.0)
MCV: 96.3 fl (ref 78.0–100.0)
Monocytes Absolute: 0.5 10*3/uL (ref 0.1–1.0)
Monocytes Relative: 8.4 % (ref 3.0–12.0)
Neutro Abs: 3.3 10*3/uL (ref 1.4–7.7)
Neutrophils Relative %: 54.2 % (ref 43.0–77.0)
Platelets: 102 10*3/uL — ABNORMAL LOW (ref 150.0–400.0)
RBC: 3.61 Mil/uL — ABNORMAL LOW (ref 3.87–5.11)
RDW: 14.8 % — ABNORMAL HIGH (ref 11.5–14.6)
WBC: 6.1 10*3/uL (ref 4.5–10.5)

## 2013-07-23 LAB — COMPREHENSIVE METABOLIC PANEL WITH GFR
ALT: 21 U/L (ref 0–35)
AST: 29 U/L (ref 0–37)
Albumin: 3.4 g/dL — ABNORMAL LOW (ref 3.5–5.2)
Alkaline Phosphatase: 73 U/L (ref 39–117)
BUN: 27 mg/dL — ABNORMAL HIGH (ref 6–23)
CO2: 28 meq/L (ref 19–32)
Calcium: 9.9 mg/dL (ref 8.4–10.5)
Chloride: 107 meq/L (ref 96–112)
Creatinine, Ser: 1.1 mg/dL (ref 0.4–1.2)
GFR: 52.72 mL/min — ABNORMAL LOW
Glucose, Bld: 155 mg/dL — ABNORMAL HIGH (ref 70–99)
Potassium: 4.1 meq/L (ref 3.5–5.1)
Sodium: 142 meq/L (ref 135–145)
Total Bilirubin: 0.8 mg/dL (ref 0.3–1.2)
Total Protein: 6.8 g/dL (ref 6.0–8.3)

## 2013-07-23 LAB — HEMOGLOBIN A1C: Hgb A1c MFr Bld: 8.7 % — ABNORMAL HIGH (ref 4.6–6.5)

## 2013-07-23 LAB — TSH: TSH: 1.7 u[IU]/mL (ref 0.35–5.50)

## 2013-08-03 ENCOUNTER — Encounter: Payer: Self-pay | Admitting: Family Medicine

## 2013-08-03 ENCOUNTER — Ambulatory Visit (INDEPENDENT_AMBULATORY_CARE_PROVIDER_SITE_OTHER): Payer: Medicare HMO | Admitting: Family Medicine

## 2013-08-03 VITALS — BP 118/68 | HR 56 | Temp 98.3°F | Ht 61.25 in | Wt 171.8 lb

## 2013-08-03 DIAGNOSIS — E039 Hypothyroidism, unspecified: Secondary | ICD-10-CM

## 2013-08-03 DIAGNOSIS — Z1211 Encounter for screening for malignant neoplasm of colon: Secondary | ICD-10-CM | POA: Insufficient documentation

## 2013-08-03 DIAGNOSIS — E119 Type 2 diabetes mellitus without complications: Secondary | ICD-10-CM

## 2013-08-03 DIAGNOSIS — D649 Anemia, unspecified: Secondary | ICD-10-CM

## 2013-08-03 DIAGNOSIS — E785 Hyperlipidemia, unspecified: Secondary | ICD-10-CM

## 2013-08-03 DIAGNOSIS — I10 Essential (primary) hypertension: Secondary | ICD-10-CM

## 2013-08-03 DIAGNOSIS — D696 Thrombocytopenia, unspecified: Secondary | ICD-10-CM | POA: Insufficient documentation

## 2013-08-03 DIAGNOSIS — Z0000 Encounter for general adult medical examination without abnormal findings: Secondary | ICD-10-CM | POA: Insufficient documentation

## 2013-08-03 DIAGNOSIS — E669 Obesity, unspecified: Secondary | ICD-10-CM

## 2013-08-03 MED ORDER — METOPROLOL SUCCINATE ER 100 MG PO TB24: ORAL_TABLET | ORAL | Status: DC

## 2013-08-03 MED ORDER — HYDROCHLOROTHIAZIDE 25 MG PO TABS: ORAL_TABLET | ORAL | Status: DC

## 2013-08-03 MED ORDER — METFORMIN HCL 1000 MG PO TABS: 1000.0000 mg | ORAL_TABLET | Freq: Two times a day (BID) | ORAL | Status: DC

## 2013-08-03 MED ORDER — ROSUVASTATIN CALCIUM 10 MG PO TABS: ORAL_TABLET | ORAL | Status: DC

## 2013-08-03 MED ORDER — LEVOTHYROXINE SODIUM 50 MCG PO TABS: ORAL_TABLET | ORAL | Status: DC

## 2013-08-03 MED ORDER — GLIPIZIDE 5 MG PO TABS: 5.0000 mg | ORAL_TABLET | Freq: Two times a day (BID) | ORAL | Status: DC

## 2013-08-03 MED ORDER — TRANDOLAPRIL 4 MG PO TABS: ORAL_TABLET | ORAL | Status: DC

## 2013-08-03 NOTE — Assessment & Plan Note (Signed)
In good control with crestor and diet Disc goals for lipids and reasons to control them Rev labs with pt Rev low sat fat diet in detail

## 2013-08-03 NOTE — Assessment & Plan Note (Signed)
This is wore than in the past  Lab Results  Component Value Date   PLT 102.0* 07/23/2013  no bruising or other symptoms Will re check in 1 mo

## 2013-08-03 NOTE — Assessment & Plan Note (Signed)
Reviewed health habits including diet and exercise and skin cancer prevention Reviewed appropriate screening tests for age  Also reviewed health mt list, fam hx and immunization status , as well as social and family history    See HPI Disc working on [employer] reviewed

## 2013-08-03 NOTE — Assessment & Plan Note (Signed)
Ifob card given  Pt is mildly anemic - this is chronic - poss due to low iron intake or chronic dz

## 2013-08-03 NOTE — Assessment & Plan Note (Signed)
Discussed how this problem influences overall health and the risks it imposes  Reviewed plan for weight loss with lower calorie diet (via better food choices and also portion control or program like weight watchers) and exercise building up to or more than 30 minutes 5 days per week including some aerobic activity    

## 2013-08-03 NOTE — Assessment & Plan Note (Signed)
Worse control  Lab Results  Component Value Date   HGBA1C 8.7* 07/23/2013    On metformin Was taken off actos Will try glipizide er 5 mg daily- watching closely for hypoglycemia  Rev low glycemic diet in detail as well as plans for exercise  F/u 3 mo

## 2013-08-03 NOTE — Assessment & Plan Note (Signed)
This tends to vary Disc iron sources in diet  ifob card given

## 2013-08-03 NOTE — Progress Notes (Signed)
Subjective:    Patient ID: Stacey Harris, female    DOB: [birth year], 80 y.o.   MRN: [id number]  HPI I have personally reviewed the Medicare Annual Wellness questionnaire and have noted 1. The patient's medical and social history 2. Their use of alcohol, tobacco or illicit drugs 3. Their current medications and supplements 4. The patient's functional ability including ADL's, fall risks, home safety risks and hearing or visual             impairment. 5. Diet and physical activities 6. Evidence for depression or mood disorders  The patients weight, height, BMI have been recorded in the chart and visual acuity is per eye clinic.  I have made referrals, counseling and provided education to the patient based review of the above and I have provided the pt with a written personalized care plan for preventive services.  Wt is down 1 lb with bmi of 32   See scanned forms.  Routine anticipatory guidance given to patient.  See health maintenance. Flu vaccine 11/14 Shingles vaccine 8/11 PNA 1/04 Tetanus 1/04 vaccine  Coloniscopy 5/08 - ? When recall was (did not find any polyps last time) - thinks she has no recall due to her age   Breast cancer screening- decided to stop doing mammogram , last one 2012  No lumps on self exam  Advance directive- does not have - she will work on doing that , given handouts today Cognitive function addressed- see scanned forms- and if abnormal then additional documentation follows. - no concerns   PMH and SH reviewed  Meds, vitals, and allergies reviewed.   ROS: See HPI.  Otherwise negative.    bp is stable today  No cp or palpitations or headaches or edema  No side effects to medicines  BP Readings from Last 3 Encounters:  08/03/13 118/68  01/26/13 132/74  10/27/12 144/74      Hypothyroidism  Pt has no clinical changes No change in energy level/ hair or skin/ edema and no tremor Lab Results  Component Value Date   TSH 1.70 07/23/2013     Mild  anemia - chronic (ifob)  She is eating a lot of soups lately- mostly veg Not a lot of iron in it  Lab Results  Component Value Date   WBC 6.1 07/23/2013   HGB 11.7* 07/23/2013   HCT 34.8* 07/23/2013   MCV 96.3 07/23/2013   PLT 102.0* 07/23/2013   also low platelet Thinks her father was a "bleeder" For herself no inc in bleeding or excessive bruising    Diabetes Home sugar results - usually runs 175 in the ams/ and she does not check later in the day  DM diet - working very hard on it (she did eat some sugar over the holidays) Exercise - bicycle and walking daily Symptoms- none  A1C last  Lab Results  Component Value Date   HGBA1C 8.7* 07/23/2013   This is up from 7.0 No problems with medications - she is on metformin   (actos in the past)  Renal protection- ace  Last eye exam per pt last summer 7/14 -has not had one   Hyperlipidemia Lab Results  Component Value Date   CHOL 110 07/23/2013   CHOL 105 10/20/2012   CHOL 118 04/21/2012   Lab Results  Component Value Date   HDL 35.50* 07/23/2013   HDL 25.00* 10/20/2012   HDL 34.30* 04/21/2012   Lab Results  Component Value Date   LDLCALC 55 07/23/2013  LDLCALC 58 10/20/2012   LDLCALC 67 04/21/2012   Lab Results  Component Value Date   TRIG 99.0 07/23/2013   TRIG 109.0 10/20/2012   TRIG 83.0 04/21/2012   Lab Results  Component Value Date   CHOLHDL 3 07/23/2013   CHOLHDL 4 10/20/2012   CHOLHDL 3 04/21/2012   Lab Results  Component Value Date   LDLDIRECT 202.7 08/17/2010   cholesterol looks great     Patient Active Problem List   Diagnosis Date Noted  . Encounter for Medicare annual wellness exam 08/03/2013  . Colon cancer screening 08/03/2013  . Thrombocytopenia, unspecified 08/03/2013  . Insomnia 01/26/2013  . Bee sting reaction 01/26/2013  . Hypothyroid 09/17/2011  . Lumbar spinal stenosis 06/21/2011    Class: Present on Admission  . Anemia, mild 05/25/2011  . BACK PAIN, LUMBAR 08/23/2010  . POSTNASAL DRIP  08/23/2010  . OSTEOARTHRITIS, HIP 03/16/2009  . MENOPAUSAL SYNDROME 12/08/2008  . CORNS AND CALLOSITIES 08/13/2007  . BASAL CELL CARCINOMA, FACE 07/01/2007  . BASAL CELL CARCINOMA, FACE 07/01/2007  . DIABETES, TYPE 2 09/24/2006  . DIABETIC PERIPHERAL NEUROPATHY 09/24/2006  . HYPERLIPIDEMIA 09/24/2006  . OBESITY NOS 09/24/2006  . HYPERTENSION 09/24/2006  . ROSACEA 09/24/2006  . INCONTINENCE, URGE 09/24/2006  . COLONOSCOPY AND REMOVAL OF LESION, HX OF 06/09/2003   Past Medical History  Diagnosis Date  . Hyperlipidemia   . Hypertension   . Hypothyroidism   . OA (osteoarthritis)   . Cancer     basal cell skin CA  . Diabetes mellitus     type II  . Blood transfusion    Past Surgical History  Procedure Laterality Date  . Tah       and BSO  . Tonsillectomy    . Mohs surgery  12/08    basal cell skin cancer lt temple  . Joint replacement      rt total hip 10  . Ankle fracture surgery  10    rt  . Lumbar laminectomy/decompression microdiscectomy  06/22/2011    Procedure: LUMBAR LAMINECTOMY/DECOMPRESSION MICRODISCECTOMY;  Surgeon: Marybelle Killings;  Location: [facility];  Service: Orthopedics;  Laterality: N/A;  L3-4, L4-5 Decompression   History  Substance Use Topics  . Smoking status: Never Smoker   . Smokeless tobacco: Not on file  . Alcohol Use: No   Family History  Problem Relation Age of Onset  . Arthritis Father     RA  . Hypertension Father   . Heart disease Father     CAD  . Diabetes Father   . Arthritis Sister     RA  . Diabetes Brother   . Diabetes Brother   . Diabetes Brother    No Known Allergies Current Outpatient Prescriptions on File Prior to Visit  Medication Sig Dispense Refill  . aspirin 81 MG tablet Take 81 mg by mouth daily.        . Blood Glucose Monitoring Suppl (ONE TOUCH ULTRA MINI) W/DEVICE KIT Check blood sugar once daily and as directed.Dx 250.00  1 each  0  . diazepam (VALIUM) 5 MG tablet Take 0.5 tablets (2.5 mg total) by mouth at bedtime  as needed for sleep.  30 tablet  0  . glucose blood (ONE TOUCH ULTRA TEST) test strip Check blood sugar once a day and as directed.Dx 250.00.  100 each  5  . Omega-3 Fatty Acids (FISH OIL) 1200 MG CAPS Take 1,200 mg by mouth 2 (two) times daily.  No current facility-administered medications on file prior to visit.      Review of Systems Review of Systems  Constitutional: Negative for fever, appetite change, fatigue and unexpected weight change.  Eyes: Negative for pain and visual disturbance.  Respiratory: Negative for cough and shortness of breath.   Cardiovascular: Negative for cp or palpitations    Gastrointestinal: Negative for nausea, diarrhea and constipation.  Genitourinary: Negative for urgency and frequency.  Skin: Negative for pallor or rash   Neurological: Negative for weakness, light-headedness, numbness and headaches.  Hematological: Negative for adenopathy. Does not bruise/bleed easily.  Psychiatric/Behavioral: Negative for dysphoric mood. The patient is not nervous/anxious.         Objective:   Physical Exam  Constitutional: She appears well-developed and well-nourished. No distress.  obese and well appearing   HENT:  Head: Normocephalic and atraumatic.  Right Ear: External ear normal.  Left Ear: External ear normal.  Mouth/Throat: Oropharynx is clear and moist.  Eyes: Conjunctivae and EOM are normal. Pupils are equal, round, and reactive to light. No scleral icterus.  Neck: Normal range of motion. Neck supple. No JVD present. Carotid bruit is not present. No thyromegaly present.  Cardiovascular: Normal rate, regular rhythm, normal heart sounds and intact distal pulses.  Exam reveals no gallop.   Pulmonary/Chest: Effort normal and breath sounds normal. No respiratory distress. She has no wheezes. She exhibits no tenderness.  Abdominal: Soft. Bowel sounds are normal. She exhibits no distension, no abdominal bruit and no mass. There is no tenderness.    Genitourinary: No breast swelling, tenderness, discharge or bleeding.  Breast exam: No mass, nodules, thickening, tenderness, bulging, retraction, inflamation, nipple discharge or skin changes noted.  No axillary or clavicular LA.   Musculoskeletal: Normal range of motion. She exhibits no edema and no tenderness.  Lymphadenopathy:    She has no cervical adenopathy.  Neurological: She is alert. She has normal reflexes. No cranial nerve deficit. She exhibits normal muscle tone. Coordination normal.  Skin: Skin is warm and dry. No rash noted. No erythema. No pallor.  Some SKs and lentigos and skin tags  Psychiatric: She has a normal mood and affect.          Assessment & Plan:

## 2013-08-03 NOTE — Patient Instructions (Signed)
You are due for a tetanus shot - it will be [facility] it [facility]  Please do the screening colon card  Schedule non fasting labs for 1 month - for platelet count - if you develop a lot of bruising let me know  Start glipizide for diabetes - 1 pill in the am Follow up with me in 3 me in 3 months with labs prior  Start checking sugar - am and also 2 hours after a meal in the evening

## 2013-08-03 NOTE — Assessment & Plan Note (Signed)
Hypothyroidism  Pt has no clinical changes No change in energy level/ hair or skin/ edema and no tremor Lab Results  Component Value Date   TSH 1.70 07/23/2013

## 2013-08-03 NOTE — Assessment & Plan Note (Signed)
BP: 118/68 mmHg  bp in fair control at this time  No changes needed Disc lifstyle change with low sodium diet and exercise  Labs reviewed

## 2013-08-03 NOTE — Progress Notes (Signed)
Pre-visit discussion using our clinic review tool. No additional management support is needed unless otherwise documented below in the visit note.  

## 2013-08-05 ENCOUNTER — Other Ambulatory Visit: Payer: Self-pay | Admitting: Family Medicine

## 2013-08-06 ENCOUNTER — Telehealth: Payer: Self-pay

## 2013-08-06 NOTE — Telephone Encounter (Signed)
Relevant patient education mailed to patient.  

## 2013-08-12 ENCOUNTER — Telehealth: Payer: Self-pay | Admitting: Family Medicine

## 2013-08-12 NOTE — Telephone Encounter (Signed)
Relevant patient education mailed to patient.  

## 2013-08-21 ENCOUNTER — Other Ambulatory Visit: Payer: Self-pay | Admitting: Family Medicine

## 2013-09-05 ENCOUNTER — Other Ambulatory Visit: Payer: Self-pay | Admitting: Family Medicine

## 2013-09-07 ENCOUNTER — Other Ambulatory Visit: Payer: Self-pay | Admitting: *Deleted

## 2013-09-07 ENCOUNTER — Other Ambulatory Visit (INDEPENDENT_AMBULATORY_CARE_PROVIDER_SITE_OTHER): Payer: Medicare HMO

## 2013-09-07 DIAGNOSIS — D696 Thrombocytopenia, unspecified: Secondary | ICD-10-CM

## 2013-09-07 DIAGNOSIS — E119 Type 2 diabetes mellitus without complications: Secondary | ICD-10-CM

## 2013-09-07 LAB — CBC WITH DIFFERENTIAL/PLATELET
BASOS ABS: 0 10*3/uL (ref 0.0–0.1)
Basophils Relative: 0.6 % (ref 0.0–3.0)
EOS ABS: 0.1 10*3/uL (ref 0.0–0.7)
Eosinophils Relative: 2.1 % (ref 0.0–5.0)
HCT: 35.1 % — ABNORMAL LOW (ref 36.0–46.0)
HEMOGLOBIN: 11.3 g/dL — AB (ref 12.0–15.0)
LYMPHS ABS: 1.8 10*3/uL (ref 0.7–4.0)
LYMPHS PCT: 31.2 % (ref 12.0–46.0)
MCHC: 32.3 g/dL (ref 30.0–36.0)
MCV: 99.1 fl (ref 78.0–100.0)
Monocytes Absolute: 0.4 10*3/uL (ref 0.1–1.0)
Monocytes Relative: 7.5 % (ref 3.0–12.0)
Neutro Abs: 3.4 10*3/uL (ref 1.4–7.7)
Neutrophils Relative %: 58.6 % (ref 43.0–77.0)
Platelets: 107 10*3/uL — ABNORMAL LOW (ref 150.0–400.0)
RBC: 3.54 Mil/uL — ABNORMAL LOW (ref 3.87–5.11)
RDW: 15.4 % — AB (ref 11.5–14.6)
WBC: 5.8 10*3/uL (ref 4.5–10.5)

## 2013-09-07 LAB — HEMOGLOBIN A1C: Hgb A1c MFr Bld: 8 % — ABNORMAL HIGH (ref 4.6–6.5)

## 2013-09-08 ENCOUNTER — Encounter: Payer: Self-pay | Admitting: *Deleted

## 2013-09-14 ENCOUNTER — Telehealth: Payer: Self-pay | Admitting: *Deleted

## 2013-09-14 ENCOUNTER — Other Ambulatory Visit: Payer: Self-pay | Admitting: Family Medicine

## 2013-09-14 NOTE — Telephone Encounter (Signed)
We have received refill request (electronically and fax) for a few of pt's Rxs. Pt was given paper Rxs at her CPE appt on 1/2  Left voicemail requesting pt to call office, so I can see if she needs these Rxs electronically refilled and if so ask pt what happened to the paper Rxs she was given on 08/03/13

## 2013-09-15 MED ORDER — ROSUVASTATIN CALCIUM 10 MG PO TABS: ORAL_TABLET | ORAL | Status: DC

## 2013-09-15 MED ORDER — GLIPIZIDE 5 MG PO TABS: 5.0000 mg | ORAL_TABLET | Freq: Two times a day (BID) | ORAL | Status: DC

## 2013-09-15 MED ORDER — TRANDOLAPRIL 4 MG PO TABS: ORAL_TABLET | ORAL | Status: DC

## 2013-09-15 MED ORDER — LEVOTHYROXINE SODIUM 50 MCG PO TABS: ORAL_TABLET | ORAL | Status: DC

## 2013-09-15 MED ORDER — METOPROLOL SUCCINATE ER 100 MG PO TB24: ORAL_TABLET | ORAL | Status: DC

## 2013-09-15 NOTE — Telephone Encounter (Signed)
Spoke with pt and she still has the paper Rxs pt is sending them to a mail order pharmacy but wanted to talk to Dr. Glori Bickers before she did it, pt has a f/u appt in April and will hold paper Rxs until then but request a Rx sent to her local pharmacy in the mean time. Done

## 2013-09-15 NOTE — Telephone Encounter (Signed)
Pt left v/m returning call and pt request cb. 

## 2013-10-01 ENCOUNTER — Encounter: Payer: Self-pay | Admitting: Family Medicine

## 2013-10-01 ENCOUNTER — Other Ambulatory Visit (INDEPENDENT_AMBULATORY_CARE_PROVIDER_SITE_OTHER): Payer: Medicare HMO

## 2013-10-01 DIAGNOSIS — Z1211 Encounter for screening for malignant neoplasm of colon: Secondary | ICD-10-CM

## 2013-10-01 LAB — FECAL OCCULT BLOOD, IMMUNOCHEMICAL: Fecal Occult Bld: NEGATIVE

## 2013-10-13 ENCOUNTER — Other Ambulatory Visit: Payer: Self-pay | Admitting: Family Medicine

## 2013-10-22 ENCOUNTER — Other Ambulatory Visit: Payer: Self-pay | Admitting: Family Medicine

## 2013-10-25 ENCOUNTER — Telehealth: Payer: Self-pay | Admitting: Family Medicine

## 2013-10-25 DIAGNOSIS — E119 Type 2 diabetes mellitus without complications: Secondary | ICD-10-CM

## 2013-10-25 DIAGNOSIS — D649 Anemia, unspecified: Secondary | ICD-10-CM

## 2013-10-25 DIAGNOSIS — D696 Thrombocytopenia, unspecified: Secondary | ICD-10-CM

## 2013-10-25 NOTE — Telephone Encounter (Signed)
Message copied by [name] on Sun Oct 25, 2013  1:41 PM ------      Message from: Ellamae Sia      Created: Wed Oct 21, 2013  3:38 PM      Regarding: Lab orders for Monday, 4.20.15       Lab orders for a f/u ------

## 2013-10-26 ENCOUNTER — Other Ambulatory Visit (INDEPENDENT_AMBULATORY_CARE_PROVIDER_SITE_OTHER): Payer: Medicare HMO

## 2013-10-26 DIAGNOSIS — D696 Thrombocytopenia, unspecified: Secondary | ICD-10-CM

## 2013-10-26 DIAGNOSIS — E119 Type 2 diabetes mellitus without complications: Secondary | ICD-10-CM

## 2013-10-26 DIAGNOSIS — D649 Anemia, unspecified: Secondary | ICD-10-CM

## 2013-10-26 LAB — HEMOGLOBIN A1C: HEMOGLOBIN A1C: 6.9 % — AB (ref 4.6–6.5)

## 2013-10-26 LAB — CBC WITH DIFFERENTIAL/PLATELET
Basophils Absolute: 0 10*3/uL (ref 0.0–0.1)
Basophils Relative: 0.7 % (ref 0.0–3.0)
Eosinophils Absolute: 0.1 10*3/uL (ref 0.0–0.7)
Eosinophils Relative: 1.8 % (ref 0.0–5.0)
HCT: 34.4 % — ABNORMAL LOW (ref 36.0–46.0)
Hemoglobin: 11.4 g/dL — ABNORMAL LOW (ref 12.0–15.0)
Lymphocytes Relative: 32.3 % (ref 12.0–46.0)
Lymphs Abs: 2 10*3/uL (ref 0.7–4.0)
MCHC: 33.1 g/dL (ref 30.0–36.0)
MCV: 97.8 fl (ref 78.0–100.0)
MONO ABS: 0.5 10*3/uL (ref 0.1–1.0)
MONOS PCT: 8.2 % (ref 3.0–12.0)
NEUTROS PCT: 57 % (ref 43.0–77.0)
Neutro Abs: 3.6 10*3/uL (ref 1.4–7.7)
PLATELETS: 115 10*3/uL — AB (ref 150.0–400.0)
RBC: 3.52 Mil/uL — ABNORMAL LOW (ref 3.87–5.11)
RDW: 15.4 % — ABNORMAL HIGH (ref 11.5–14.6)
WBC: 6.4 10*3/uL (ref 4.5–10.5)

## 2013-11-02 ENCOUNTER — Ambulatory Visit (INDEPENDENT_AMBULATORY_CARE_PROVIDER_SITE_OTHER): Payer: Commercial Managed Care - HMO | Admitting: Family Medicine

## 2013-11-02 ENCOUNTER — Encounter: Payer: Self-pay | Admitting: Family Medicine

## 2013-11-02 VITALS — BP 132/68 | HR 59 | Temp 98.1°F | Ht 61.25 in | Wt 174.0 lb

## 2013-11-02 DIAGNOSIS — D696 Thrombocytopenia, unspecified: Secondary | ICD-10-CM

## 2013-11-02 DIAGNOSIS — E119 Type 2 diabetes mellitus without complications: Secondary | ICD-10-CM

## 2013-11-02 DIAGNOSIS — I10 Essential (primary) hypertension: Secondary | ICD-10-CM

## 2013-11-02 MED ORDER — GLIMEPIRIDE 2 MG PO TABS: 2.0000 mg | ORAL_TABLET | Freq: Every day | ORAL | Status: DC

## 2013-11-02 NOTE — Progress Notes (Signed)
Pre visit review using our clinic review tool, if applicable. No additional management support is needed unless otherwise documented below in the visit note. 

## 2013-11-02 NOTE — Patient Instructions (Signed)
Stop the glipizide since you think it causes your acid reflux symptoms  Try amaryl instead 2 mg daily  Schedule follow up with labs prior in 3 months   Blood count is stable and platelets are improved-we will continue to watch it   Keep working on diet and exercise

## 2013-11-02 NOTE — Assessment & Plan Note (Signed)
bp in fair control at this time  BP Readings from Last 1 Encounters:  11/02/13 132/68   No changes needed Disc lifstyle change with low sodium diet and exercise  Labs rev

## 2013-11-02 NOTE — Progress Notes (Signed)
Subjective:    Patient ID: Stacey Harris, female    DOB: 1929/02/06, 80 y.o.   MRN: [id number]  HPI Here for f/u of chronic medical problems   She wants to come off crestor due to what she has read about it  She worries about it's effects on blood sugar and liver  No liver abn in labs personally  Lab Results  Component Value Date   CHOL 110 07/23/2013   HDL 35.50* 07/23/2013   LDLCALC 55 07/23/2013   LDLDIRECT 202.7 08/17/2010   TRIG 99.0 07/23/2013   CHOLHDL 3 07/23/2013    Lab Results  Component Value Date   ALT 21 07/23/2013   AST 29 07/23/2013   ALKPHOS 73 07/23/2013   BILITOT 0.8 07/23/2013     Wt is up 3 lb with bmi of 32  Anemia/ thrombocytopenia mild Lab Results  Component Value Date   WBC 6.4 10/26/2013   HGB 11.4* 10/26/2013   HCT 34.4* 10/26/2013   MCV 97.8 10/26/2013   PLT 115.0* 10/26/2013   no easy bruising or bleeding  Diabetes Home sugar results - had come down from where they were , had one low reading when she did not eat  DM diet - is trying to eat better also  Exercise - she is riding bicycle "when she can" and does calesthenics and out in the yard and walking  Symptoms - none  A1C last  Lab Results  Component Value Date   HGBA1C 6.9* 10/26/2013  down from 8.0  Started glipizide - ST ever since with acid reflux   (she stopped it for several days and symptoms went away)  Renal protection- ace  Last eye exam 7/14  bp is stable today  No cp or palpitations or headaches or edema  No side effects to medicines  BP Readings from Last 3 Encounters:  11/02/13 132/68  08/03/13 118/68  01/26/13 132/74        Patient Active Problem List   Diagnosis Date Noted  . Encounter for Medicare annual wellness exam 08/03/2013  . Colon cancer screening 08/03/2013  . Thrombocytopenia, unspecified 08/03/2013  . Insomnia 01/26/2013  . Bee sting reaction 01/26/2013  . Hypothyroid 09/17/2011  . Lumbar spinal stenosis 06/21/2011    Class: Present on Admission  .  Anemia, mild 05/25/2011  . BACK PAIN, LUMBAR 08/23/2010  . POSTNASAL DRIP 08/23/2010  . OSTEOARTHRITIS, HIP 03/16/2009  . MENOPAUSAL SYNDROME 12/08/2008  . CORNS AND CALLOSITIES 08/13/2007  . BASAL CELL CARCINOMA, FACE 07/01/2007  . BASAL CELL CARCINOMA, FACE 07/01/2007  . DIABETES, TYPE 2 09/24/2006  . DIABETIC PERIPHERAL NEUROPATHY 09/24/2006  . HYPERLIPIDEMIA 09/24/2006  . OBESITY NOS 09/24/2006  . HYPERTENSION 09/24/2006  . ROSACEA 09/24/2006  . INCONTINENCE, URGE 09/24/2006  . COLONOSCOPY AND REMOVAL OF LESION, HX OF 06/09/2003   Past Medical History  Diagnosis Date  . Hyperlipidemia   . Hypertension   . Hypothyroidism   . OA (osteoarthritis)   . Cancer     basal cell skin CA  . Diabetes mellitus     type II  . Blood transfusion    Past Surgical History  Procedure Laterality Date  . Tah       and BSO  . Tonsillectomy    . Mohs surgery  12/08    basal cell skin cancer lt temple  . Joint replacement      rt total hip 10  . Ankle fracture surgery  10    rt  .  Lumbar laminectomy/decompression microdiscectomy  06/22/2011    Procedure: LUMBAR LAMINECTOMY/DECOMPRESSION MICRODISCECTOMY;  Surgeon: Marybelle Killings;  Location: [facility];  Service: Orthopedics;  Laterality: N/A;  L3-4, L4-5 Decompression   History  Substance Use Topics  . Smoking status: Never Smoker   . Smokeless tobacco: Not on file  . Alcohol Use: No   Family History  Problem Relation Age of Onset  . Arthritis Father     RA  . Hypertension Father   . Heart disease Father     CAD  . Diabetes Father   . Arthritis Sister     RA  . Diabetes Brother   . Diabetes Brother   . Diabetes Brother    No Known Allergies Current Outpatient Prescriptions on File Prior to Visit  Medication Sig Dispense Refill  . aspirin 81 MG tablet Take 81 mg by mouth daily.        . Blood Glucose Monitoring Suppl (ONE TOUCH ULTRA MINI) W/DEVICE KIT Check blood sugar once daily and as directed.Dx 250.00  1 each  0  .  diazepam (VALIUM) 5 MG tablet Take 0.5 tablets (2.5 mg total) by mouth at bedtime as needed for sleep.  30 tablet  0  . glipiZIDE (GLUCOTROL) 5 MG tablet TAKE 1 TABLET (5 MG TOTAL) BY MOUTH 2 (TWO) TIMES DAILY BEFORE A MEAL.  30 tablet  0  . glucose blood (ONE TOUCH ULTRA TEST) test strip Check blood sugar once a day and as directed.Dx 250.00.  100 each  5  . hydrochlorothiazide (HYDRODIURIL) 25 MG tablet TAKE 1 TABLET BY MOUTH EVERY MORNING  30 tablet  0  . levothyroxine (SYNTHROID, LEVOTHROID) 50 MCG tablet TAKE 1 TABLET BY MOUTH DAILY  30 tablet  1  . metFORMIN (GLUCOPHAGE) 1000 MG tablet TAKE 1 TABLET BY MOUTH 2 TIMES DAILY WITH A MEAL.  60 tablet  0  . metoprolol succinate (TOPROL-XL) 100 MG 24 hr tablet TAKE 1 TABLET BY MOUTH DAILY  30 tablet  1  . Omega-3 Fatty Acids (FISH OIL) 1200 MG CAPS Take 1,200 mg by mouth 2 (two) times daily.        . rosuvastatin (CRESTOR) 10 MG tablet TAKE 1 TABLET BY MOUTH DAILY  30 tablet  1  . trandolapril (MAVIK) 4 MG tablet TAKE 1 TABLET TWICE A DAY  60 tablet  1   No current facility-administered medications on file prior to visit.    Review of Systems Review of Systems  Constitutional: Negative for fever, appetite change,  and unexpected weight change.  Eyes: Negative for pain and visual disturbance.  Respiratory: Negative for cough and shortness of breath.   Cardiovascular: Negative for cp or palpitations    Gastrointestinal: Negative for nausea, diarrhea and constipation. pos for acid reflux symptoms  Genitourinary: Negative for urgency and frequency.  Skin: Negative for pallor or rash   Neurological: Negative for weakness, light-headedness, numbness and headaches.  Hematological: Negative for adenopathy. Does not bruise/bleed easily.  Psychiatric/Behavioral: Negative for dysphoric mood. The patient is not nervous/anxious.         Objective:   Physical Exam  Constitutional: She appears well-developed and well-nourished. No distress.  obese and  well appearing   HENT:  Head: Normocephalic and atraumatic.  Mouth/Throat: Oropharynx is clear and moist.  Eyes: Conjunctivae and EOM are normal. Pupils are equal, round, and reactive to light. Right eye exhibits no discharge. Left eye exhibits no discharge. No scleral icterus.  Neck: Normal range of motion. Neck supple. No  JVD present. Carotid bruit is not present. No thyromegaly present.  Cardiovascular: Normal rate, regular rhythm, normal heart sounds and intact distal pulses.  Exam reveals no gallop.   Pulmonary/Chest: Effort normal and breath sounds normal. No respiratory distress. She has no wheezes.  Abdominal: Soft. Bowel sounds are normal. She exhibits no distension, no abdominal bruit and no mass. There is no tenderness.  Musculoskeletal: She exhibits no edema.  Lymphadenopathy:    She has no cervical adenopathy.  Neurological: She is alert. She has normal reflexes. No cranial nerve deficit. She exhibits normal muscle tone. Coordination normal.  Skin: Skin is warm and dry. No rash noted. No erythema. No pallor.  Psychiatric: She has a normal mood and affect.          Assessment & Plan:

## 2013-11-02 NOTE — Assessment & Plan Note (Signed)
Improved today Lab Results  Component Value Date   PLT 115.0* 10/26/2013   Pt has no symptoms Will continue to follow

## 2013-11-02 NOTE — Assessment & Plan Note (Signed)
Lab Results  Component Value Date   HGBA1C 6.9* 10/26/2013   much improved with addn of sulfonurea-but pt susp glipizide is causing acid reflux  Will change to amaryl low dose and see if she tolerates this better  Diet disc -getting better

## 2013-11-06 ENCOUNTER — Telehealth: Payer: Self-pay

## 2013-11-06 NOTE — Telephone Encounter (Signed)
Relevant patient education mailed to patient.  

## 2013-11-10 ENCOUNTER — Other Ambulatory Visit: Payer: Self-pay | Admitting: Family Medicine

## 2013-11-11 ENCOUNTER — Other Ambulatory Visit: Payer: Self-pay | Admitting: *Deleted

## 2013-11-11 MED ORDER — GLUCOSE BLOOD VI STRP: ORAL_STRIP | Status: DC

## 2013-11-11 MED ORDER — ACCU-CHEK AVIVA PLUS W/DEVICE KIT: PACK | Status: DC

## 2013-11-11 MED ORDER — ACCU-CHEK FASTCLIX LANCETS MISC: Status: DC

## 2014-01-25 ENCOUNTER — Other Ambulatory Visit (INDEPENDENT_AMBULATORY_CARE_PROVIDER_SITE_OTHER): Payer: Commercial Managed Care - HMO

## 2014-01-25 DIAGNOSIS — E119 Type 2 diabetes mellitus without complications: Secondary | ICD-10-CM

## 2014-01-25 DIAGNOSIS — I10 Essential (primary) hypertension: Secondary | ICD-10-CM

## 2014-01-25 DIAGNOSIS — D696 Thrombocytopenia, unspecified: Secondary | ICD-10-CM

## 2014-01-25 LAB — CBC WITH DIFFERENTIAL/PLATELET
BASOS PCT: 0.4 % (ref 0.0–3.0)
Basophils Absolute: 0 10*3/uL (ref 0.0–0.1)
EOS PCT: 1.8 % (ref 0.0–5.0)
Eosinophils Absolute: 0.1 10*3/uL (ref 0.0–0.7)
HCT: 32.8 % — ABNORMAL LOW (ref 36.0–46.0)
Hemoglobin: 11 g/dL — ABNORMAL LOW (ref 12.0–15.0)
LYMPHS PCT: 35 % (ref 12.0–46.0)
Lymphs Abs: 2 10*3/uL (ref 0.7–4.0)
MCHC: 33.5 g/dL (ref 30.0–36.0)
MCV: 98.5 fl (ref 78.0–100.0)
MONO ABS: 0.5 10*3/uL (ref 0.1–1.0)
Monocytes Relative: 8.4 % (ref 3.0–12.0)
NEUTROS PCT: 54.4 % (ref 43.0–77.0)
Neutro Abs: 3.1 10*3/uL (ref 1.4–7.7)
Platelets: 127 10*3/uL — ABNORMAL LOW (ref 150.0–400.0)
RBC: 3.34 Mil/uL — AB (ref 3.87–5.11)
RDW: 15.1 % (ref 11.5–15.5)
WBC: 5.8 10*3/uL (ref 4.0–10.5)

## 2014-01-25 LAB — LIPID PANEL
CHOL/HDL RATIO: 3
Cholesterol: 113 mg/dL (ref 0–200)
HDL: 37.2 mg/dL — ABNORMAL LOW (ref >39.00)
LDL CALC: 60 mg/dL (ref 0–99)
NONHDL: 75.8
Triglycerides: 77 mg/dL (ref 0.0–149.0)
VLDL: 15.4 mg/dL (ref 0.0–40.0)

## 2014-01-25 LAB — COMPREHENSIVE METABOLIC PANEL
ALBUMIN: 3.4 g/dL — AB (ref 3.5–5.2)
ALK PHOS: 57 U/L (ref 39–117)
ALT: 27 U/L (ref 0–35)
AST: 36 U/L (ref 0–37)
BILIRUBIN TOTAL: 1 mg/dL (ref 0.2–1.2)
BUN: 30 mg/dL — ABNORMAL HIGH (ref 6–23)
CO2: 23 mEq/L (ref 19–32)
Calcium: 9.4 mg/dL (ref 8.4–10.5)
Chloride: 110 mEq/L (ref 96–112)
Creatinine, Ser: 1.1 mg/dL (ref 0.4–1.2)
GFR: 50.45 mL/min — ABNORMAL LOW (ref >60.00)
Glucose, Bld: 119 mg/dL — ABNORMAL HIGH (ref 70–99)
POTASSIUM: 3.8 meq/L (ref 3.5–5.1)
SODIUM: 142 meq/L (ref 135–145)
TOTAL PROTEIN: 6.5 g/dL (ref 6.0–8.3)

## 2014-01-25 LAB — HEMOGLOBIN A1C: HEMOGLOBIN A1C: 6.8 % — AB (ref 4.6–6.5)

## 2014-01-27 ENCOUNTER — Other Ambulatory Visit: Payer: Self-pay | Admitting: Family Medicine

## 2014-02-01 ENCOUNTER — Encounter: Payer: Self-pay | Admitting: Family Medicine

## 2014-02-01 ENCOUNTER — Ambulatory Visit (INDEPENDENT_AMBULATORY_CARE_PROVIDER_SITE_OTHER): Payer: Commercial Managed Care - HMO | Admitting: Family Medicine

## 2014-02-01 VITALS — BP 126/72 | HR 58 | Temp 98.0°F | Ht 61.25 in | Wt 173.5 lb

## 2014-02-01 DIAGNOSIS — D696 Thrombocytopenia, unspecified: Secondary | ICD-10-CM

## 2014-02-01 DIAGNOSIS — M48061 Spinal stenosis, lumbar region without neurogenic claudication: Secondary | ICD-10-CM

## 2014-02-01 DIAGNOSIS — E119 Type 2 diabetes mellitus without complications: Secondary | ICD-10-CM

## 2014-02-01 DIAGNOSIS — E785 Hyperlipidemia, unspecified: Secondary | ICD-10-CM

## 2014-02-01 DIAGNOSIS — G4700 Insomnia, unspecified: Secondary | ICD-10-CM

## 2014-02-01 DIAGNOSIS — I10 Essential (primary) hypertension: Secondary | ICD-10-CM

## 2014-02-01 MED ORDER — DIAZEPAM 5 MG PO TABS: 2.5000 mg | ORAL_TABLET | Freq: Every evening | ORAL | Status: DC | PRN

## 2014-02-01 NOTE — Progress Notes (Signed)
Subjective:    Patient ID: Stacey Harris, female    DOB: 01/24/1929, 80 y.o.   MRN: [id number]  HPI Here for f/u of chronic medical problems   Is feeling pretty good overall  Wt is stable with bmi 32  She is eating a lot of vegetables  Needs a refill of diazepam - and she does not take it very often   Needs a parking form filled out for handicapped  Back pain from spinal stenosis keeps her from walking some days  Also she continues to have hip problems- most recently had bursitis     bp is stable today  No cp or palpitations or headaches or edema  No side effects to medicines  BP Readings from Last 3 Encounters:  02/01/14 126/72  11/02/13 132/68  08/03/13 118/68     Diabetes Home sugar results (no low sugars)  DM diet - very good  Exercise - limited due to spine and hip  Symptoms- none  A1C last  Lab Results  Component Value Date   HGBA1C 6.8* 01/25/2014  down form 6.9   No problems with medications -is on amaryl (not having the gerd symptoms with it) Renal protection ace  Last eye exam a year ago    Thrombocytopenia  Improved Lab Results  Component Value Date   WBC 5.8 01/25/2014   HGB 11.0* 01/25/2014   HCT 32.8* 01/25/2014   MCV 98.5 01/25/2014   PLT 127.0* 01/25/2014   no bruising/does not bruise easily No easy bleeding No abd pain  Still takes 81 mg asa daily  Hyperlipidemia  Lab Results  Component Value Date   CHOL 113 01/25/2014   CHOL 110 07/23/2013   CHOL 105 10/20/2012   Lab Results  Component Value Date   HDL 37.20* 01/25/2014   HDL 35.50* 07/23/2013   HDL 25.00* 10/20/2012   Lab Results  Component Value Date   LDLCALC 60 01/25/2014   LDLCALC 55 07/23/2013   LDLCALC 58 10/20/2012   Lab Results  Component Value Date   TRIG 77.0 01/25/2014   TRIG 99.0 07/23/2013   TRIG 109.0 10/20/2012   Lab Results  Component Value Date   CHOLHDL 3 01/25/2014   CHOLHDL 3 07/23/2013   CHOLHDL 4 10/20/2012   Lab Results  Component Value Date   LDLDIRECT 202.7 08/17/2010     HDL is a bit low - she does take fish oil  Diet is overall low in sat fats   Patient Active Problem List   Diagnosis Date Noted  . Encounter for Medicare annual wellness exam 08/03/2013  . Colon cancer screening 08/03/2013  . Thrombocytopenia, unspecified 08/03/2013  . Insomnia 01/26/2013  . Bee sting reaction 01/26/2013  . Hypothyroid 09/17/2011  . Lumbar spinal stenosis 06/21/2011    Class: Present on Admission  . Anemia, mild 05/25/2011  . BACK PAIN, LUMBAR 08/23/2010  . POSTNASAL DRIP 08/23/2010  . OSTEOARTHRITIS, HIP 03/16/2009  . MENOPAUSAL SYNDROME 12/08/2008  . CORNS AND CALLOSITIES 08/13/2007  . BASAL CELL CARCINOMA, FACE 07/01/2007  . BASAL CELL CARCINOMA, FACE 07/01/2007  . DIABETES, TYPE 2 09/24/2006  . DIABETIC PERIPHERAL NEUROPATHY 09/24/2006  . HYPERLIPIDEMIA 09/24/2006  . OBESITY NOS 09/24/2006  . HYPERTENSION 09/24/2006  . ROSACEA 09/24/2006  . INCONTINENCE, URGE 09/24/2006  . COLONOSCOPY AND REMOVAL OF LESION, HX OF 06/09/2003   Past Medical History  Diagnosis Date  . Hyperlipidemia   . Hypertension   . Hypothyroidism   . OA (osteoarthritis)   . Cancer  basal cell skin CA  . Diabetes mellitus     type II  . Blood transfusion    Past Surgical History  Procedure Laterality Date  . [place]       and BSO  . Tonsillectomy    . Mohs surgery  12/08    basal cell skin cancer lt temple  . Joint replacement      rt total hip 10  . Ankle fracture surgery  10    rt  . Lumbar laminectomy/decompression microdiscectomy  06/22/2011    Procedure: LUMBAR LAMINECTOMY/DECOMPRESSION MICRODISCECTOMY;  Surgeon: Marybelle Killings;  Location: [facility];  Service: Orthopedics;  Laterality: N/A;  L3-4, L4-5 Decompression   History  Substance Use Topics  . Smoking status: Never Smoker   . Smokeless tobacco: Not on file  . Alcohol Use: No   Family History  Problem Relation Age of Onset  . Arthritis Father     RA  . Hypertension Father   .  Heart disease Father     CAD  . Diabetes Father   . Arthritis Sister     RA  . Diabetes Brother   . Diabetes Brother   . Diabetes Brother    Allergies  Allergen Reactions  . Glipizide     Acid reflux   Current Outpatient Prescriptions on File Prior to Visit  Medication Sig Dispense Refill  . ACCU-CHEK FASTCLIX LANCETS MISC Check blood sugar once daily and as directed.Dx 250.00  102 each  3  . aspirin 81 MG tablet Take 81 mg by mouth daily.        . Blood Glucose Monitoring Suppl (ACCU-CHEK AVIVA PLUS) W/DEVICE KIT Check blood sugar once daily and as directed.Dx 250.00  1 kit  0  . diazepam (VALIUM) 5 MG tablet Take 0.5 tablets (2.5 mg total) by mouth at bedtime as needed for sleep.  30 tablet  0  . glimepiride (AMARYL) 2 MG tablet Take 1 tablet (2 mg total) by mouth daily before breakfast.  30 tablet  3  . glucose blood (ACCU-CHEK AVIVA PLUS) test strip Check blood sugar once daily and as directed.Dx 250.00  100 each  3  . hydrochlorothiazide (HYDRODIURIL) 25 MG tablet TAKE 1 TABLET BY MOUTH EVERY MORNING  30 tablet  0  . levothyroxine (SYNTHROID, LEVOTHROID) 50 MCG tablet TAKE 1 TABLET BY MOUTH DAILY  30 tablet  1  . metFORMIN (GLUCOPHAGE) 1000 MG tablet TAKE 1 TABLET BY MOUTH 2 TIMES DAILY WITH A MEAL.  60 tablet  0  . metoprolol succinate (TOPROL-XL) 100 MG 24 hr tablet TAKE 1 TABLET BY MOUTH DAILY  30 tablet  5  . Omega-3 Fatty Acids (FISH OIL) 1200 MG CAPS Take 1,200 mg by mouth 2 (two) times daily.        . rosuvastatin (CRESTOR) 10 MG tablet TAKE 1 TABLET BY MOUTH DAILY  30 tablet  1  . trandolapril (MAVIK) 4 MG tablet TAKE 1 TABLET TWICE A DAY  60 tablet  1   No current facility-administered medications on file prior to visit.      Review of Systems Review of Systems  Constitutional: Negative for fever, appetite change, fatigue and unexpected weight change.  Eyes: Negative for pain and visual disturbance.  Respiratory: Negative for cough and shortness of breath.     Cardiovascular: Negative for cp or palpitations    Gastrointestinal: Negative for nausea, diarrhea and constipation.  Genitourinary: Negative for urgency and frequency.  Skin: Negative for pallor or rash  MSK pos for chronic low back pain  Neurological: Negative for weakness, light-headedness, numbness and headaches.  Hematological: Negative for adenopathy. Does not bruise/bleed easily.  Psychiatric/Behavioral: Negative for dysphoric mood. Pos for insomnia and some anxiety        Objective:   Physical Exam  Constitutional: She appears well-developed and well-nourished. No distress.  obese and well appearing   HENT:  Head: Normocephalic and atraumatic.  Mouth/Throat: Oropharynx is clear and moist.  Eyes: Conjunctivae and EOM are normal. Pupils are equal, round, and reactive to light. No scleral icterus.  Neck: Normal range of motion. Neck supple. No JVD present. Carotid bruit is not present. No thyromegaly present.  Cardiovascular: Normal rate, regular rhythm, normal heart sounds and intact distal pulses.  Exam reveals no gallop.   Pulmonary/Chest: Effort normal and breath sounds normal. No respiratory distress. She has no wheezes. She has no rales.  Abdominal: Soft. Bowel sounds are normal. She exhibits no distension and no mass. There is no tenderness.  Musculoskeletal: She exhibits no edema and no tenderness.  Lymphadenopathy:    She has no cervical adenopathy.  Neurological: She is alert. She has normal reflexes. No cranial nerve deficit. She exhibits normal muscle tone. Coordination normal.  Skin: Skin is warm and dry. No rash noted. No erythema. No pallor.  Psychiatric: She has a normal mood and affect.          Assessment & Plan:   Problem List Items Addressed This Visit     Cardiovascular and Mediastinum   HYPERTENSION - Primary      bp in fair control at this time  BP Readings from Last 1 Encounters:  02/01/14 126/72   No changes needed Disc lifstyle change  with low sodium diet and exercise        Endocrine   DIABETES, TYPE 2      Lab Results  Component Value Date   HGBA1C 6.8* 01/25/2014   Doing well  Will schedule eye exam  Rev low glycemic diet       Other   HYPERLIPIDEMIA     Disc goals for lipids and reasons to control them Rev labs with pt Rev low sat fat diet in detail  HDL is low - disc a chair exercise program     Lumbar spinal stenosis     Handicapped parking form filled out-uses it when needed     Insomnia     Refilled diazepam- uses very infrequently  Disc risk of falls and sedation and habit     Thrombocytopenia, unspecified     Platelet ct 127- improved  Will continue to watch No brusing or bleeding

## 2014-02-01 NOTE — Assessment & Plan Note (Signed)
Disc goals for lipids and reasons to control them Rev labs with pt Rev low sat fat diet in detail  HDL is low - disc a chair exercise program

## 2014-02-01 NOTE — Assessment & Plan Note (Signed)
bp in fair control at this time  BP Readings from Last 1 Encounters:  02/01/14 126/72   No changes needed Disc lifstyle change with low sodium diet and exercise

## 2014-02-01 NOTE — Patient Instructions (Signed)
Blood pressure is good  Diabetes is stable and cholesterol is stable Stay as active as you can  Follow up in 6 months for annual exam with labs prior

## 2014-02-01 NOTE — Assessment & Plan Note (Signed)
Refilled diazepam- uses very infrequently  Disc risk of falls and sedation and habit

## 2014-02-01 NOTE — Assessment & Plan Note (Signed)
Platelet ct 127- improved  Will continue to watch No brusing or bleeding

## 2014-02-01 NOTE — Assessment & Plan Note (Signed)
Handicapped parking form filled out-uses it when needed

## 2014-02-01 NOTE — Assessment & Plan Note (Signed)
Lab Results  Component Value Date   HGBA1C 6.8* 01/25/2014   Doing well  Will schedule eye exam  Rev low glycemic diet

## 2014-02-01 NOTE — Progress Notes (Signed)
Pre visit review using our clinic review tool, if applicable. No additional management support is needed unless otherwise documented below in the visit note. 

## 2014-03-04 ENCOUNTER — Telehealth: Payer: Self-pay | Admitting: Family Medicine

## 2014-03-04 NOTE — Telephone Encounter (Signed)
Form placed in your inbox

## 2014-03-04 NOTE — Telephone Encounter (Signed)
Patient dropped off application for renal of permanent disability parking placard. Request call back @ [phone] when ready. Left on [place]'s desk.

## 2014-03-05 ENCOUNTER — Other Ambulatory Visit: Payer: Self-pay | Admitting: Family Medicine

## 2014-03-07 NOTE — Telephone Encounter (Signed)
Done and in IN box 

## 2014-03-08 NOTE — Telephone Encounter (Signed)
Spoke to pt and she doesn't need the form she has already gotten her parking placards, she had not received them in the mail so she asked the DMV what to do and they told her to have [place] fill out another forms, in the mean time she did get them in the mail, pt asked that I destroy the forms because she doesn't need them, done

## 2014-04-28 ENCOUNTER — Ambulatory Visit (INDEPENDENT_AMBULATORY_CARE_PROVIDER_SITE_OTHER): Payer: Commercial Managed Care - HMO

## 2014-04-28 DIAGNOSIS — Z23 Encounter for immunization: Secondary | ICD-10-CM

## 2014-07-26 DIAGNOSIS — E119 Type 2 diabetes mellitus without complications: Secondary | ICD-10-CM | POA: Diagnosis not present

## 2014-07-26 DIAGNOSIS — H43813 Vitreous degeneration, bilateral: Secondary | ICD-10-CM | POA: Diagnosis not present

## 2014-07-26 DIAGNOSIS — H2513 Age-related nuclear cataract, bilateral: Secondary | ICD-10-CM | POA: Diagnosis not present

## 2014-07-26 DIAGNOSIS — H25013 Cortical age-related cataract, bilateral: Secondary | ICD-10-CM | POA: Diagnosis not present

## 2014-07-26 LAB — HM DIABETES EYE EXAM

## 2014-07-29 ENCOUNTER — Encounter: Payer: Self-pay | Admitting: Family Medicine

## 2014-07-30 ENCOUNTER — Other Ambulatory Visit: Payer: Commercial Managed Care - HMO

## 2014-08-01 ENCOUNTER — Telehealth: Payer: Self-pay | Admitting: Family Medicine

## 2014-08-01 DIAGNOSIS — E119 Type 2 diabetes mellitus without complications: Secondary | ICD-10-CM

## 2014-08-01 DIAGNOSIS — E039 Hypothyroidism, unspecified: Secondary | ICD-10-CM

## 2014-08-01 DIAGNOSIS — E785 Hyperlipidemia, unspecified: Secondary | ICD-10-CM

## 2014-08-01 DIAGNOSIS — I10 Essential (primary) hypertension: Secondary | ICD-10-CM

## 2014-08-01 NOTE — Telephone Encounter (Signed)
-----   Message from Ellamae Sia sent at 07/28/2014  5:17 PM EST ----- Regarding: Lab orders for Friday, 1.22.16 Patient is scheduled for CPX labs, please order future labs, Thanks , Karna Christmas

## 2014-08-02 ENCOUNTER — Other Ambulatory Visit: Payer: Commercial Managed Care - HMO

## 2014-08-05 ENCOUNTER — Other Ambulatory Visit: Payer: Self-pay | Admitting: Family Medicine

## 2014-08-06 ENCOUNTER — Ambulatory Visit (INDEPENDENT_AMBULATORY_CARE_PROVIDER_SITE_OTHER): Payer: Commercial Managed Care - HMO | Admitting: Family Medicine

## 2014-08-06 ENCOUNTER — Encounter: Payer: Self-pay | Admitting: Family Medicine

## 2014-08-06 VITALS — BP 124/76 | HR 61 | Temp 98.2°F | Ht 61.0 in | Wt 174.8 lb

## 2014-08-06 DIAGNOSIS — E039 Hypothyroidism, unspecified: Secondary | ICD-10-CM

## 2014-08-06 DIAGNOSIS — K219 Gastro-esophageal reflux disease without esophagitis: Secondary | ICD-10-CM | POA: Insufficient documentation

## 2014-08-06 DIAGNOSIS — E119 Type 2 diabetes mellitus without complications: Secondary | ICD-10-CM

## 2014-08-06 DIAGNOSIS — Z0000 Encounter for general adult medical examination without abnormal findings: Secondary | ICD-10-CM

## 2014-08-06 DIAGNOSIS — I10 Essential (primary) hypertension: Secondary | ICD-10-CM

## 2014-08-06 DIAGNOSIS — E785 Hyperlipidemia, unspecified: Secondary | ICD-10-CM | POA: Diagnosis not present

## 2014-08-06 LAB — CBC WITH DIFFERENTIAL/PLATELET
BASOS ABS: 0 10*3/uL (ref 0.0–0.1)
Basophils Relative: 0.6 % (ref 0.0–3.0)
Eosinophils Absolute: 0.1 10*3/uL (ref 0.0–0.7)
Eosinophils Relative: 2.1 % (ref 0.0–5.0)
HEMATOCRIT: 34.5 % — AB (ref 36.0–46.0)
Hemoglobin: 11.5 g/dL — ABNORMAL LOW (ref 12.0–15.0)
LYMPHS ABS: 2 10*3/uL (ref 0.7–4.0)
LYMPHS PCT: 36.5 % (ref 12.0–46.0)
MCHC: 33.5 g/dL (ref 30.0–36.0)
MCV: 97.2 fl (ref 78.0–100.0)
MONO ABS: 0.4 10*3/uL (ref 0.1–1.0)
MONOS PCT: 7.7 % (ref 3.0–12.0)
Neutro Abs: 2.9 10*3/uL (ref 1.4–7.7)
Neutrophils Relative %: 53.1 % (ref 43.0–77.0)
PLATELETS: 114 10*3/uL — AB (ref 150.0–400.0)
RBC: 3.55 Mil/uL — ABNORMAL LOW (ref 3.87–5.11)
RDW: 15.6 % — ABNORMAL HIGH (ref 11.5–15.5)
WBC: 5.4 10*3/uL (ref 4.0–10.5)

## 2014-08-06 LAB — LIPID PANEL
Cholesterol: 116 mg/dL (ref 0–200)
HDL: 33.3 mg/dL — ABNORMAL LOW (ref >39.00)
LDL Cholesterol: 62 mg/dL (ref 0–99)
NonHDL: 82.7
TRIGLYCERIDES: 105 mg/dL (ref 0.0–149.0)
Total CHOL/HDL Ratio: 3
VLDL: 21 mg/dL (ref 0.0–40.0)

## 2014-08-06 LAB — COMPREHENSIVE METABOLIC PANEL
ALBUMIN: 3.7 g/dL (ref 3.5–5.2)
ALT: 21 U/L (ref 0–35)
AST: 29 U/L (ref 0–37)
Alkaline Phosphatase: 63 U/L (ref 39–117)
BUN: 35 mg/dL — ABNORMAL HIGH (ref 6–23)
CO2: 24 mEq/L (ref 19–32)
Calcium: 10.4 mg/dL (ref 8.4–10.5)
Chloride: 108 mEq/L (ref 96–112)
Creatinine, Ser: 1.09 mg/dL (ref 0.40–1.20)
GFR: 50.92 mL/min — ABNORMAL LOW (ref >60.00)
GLUCOSE: 149 mg/dL — AB (ref 70–99)
Potassium: 4 mEq/L (ref 3.5–5.1)
SODIUM: 142 meq/L (ref 135–145)
Total Bilirubin: 0.7 mg/dL (ref 0.2–1.2)
Total Protein: 6.8 g/dL (ref 6.0–8.3)

## 2014-08-06 LAB — TSH: TSH: 2.64 u[IU]/mL (ref 0.35–4.50)

## 2014-08-06 LAB — HEMOGLOBIN A1C: Hgb A1c MFr Bld: 8.1 % — ABNORMAL HIGH (ref 4.6–6.5)

## 2014-08-06 MED ORDER — RANITIDINE HCL 150 MG PO CAPS: 150.0000 mg | ORAL_CAPSULE | Freq: Two times a day (BID) | ORAL | Status: DC

## 2014-08-06 NOTE — Progress Notes (Signed)
Pre visit review using our clinic review tool, if applicable. No additional management support is needed unless otherwise documented below in the visit note. 

## 2014-08-06 NOTE — Progress Notes (Signed)
Subjective:    Patient ID: Stacey Harris, female    DOB: [birth year], 81 y.o.   MRN: [id number]  HPI Here for annual medicare wellness visit and also chronic/acute medical problems  I have personally reviewed the Medicare Annual Wellness questionnaire and have noted 1. The patient's medical and social history 2. Their use of alcohol, tobacco or illicit drugs 3. Their current medications and supplements 4. The patient's functional ability including ADL's, fall risks, home safety risks and hearing or visual             impairment. 5. Diet and physical activities 6. Evidence for depression or mood disorders  The patients weight, height, BMI have been recorded in the chart and visual acuity is per eye clinic.  I have made referrals, counseling and provided education to the patient based review of the above and I have provided the pt with a written personalized care plan for preventive services.  Is still nauseated every afternoon Burps and gets heartburn and a knot feeling in throat   See scanned forms.  Routine anticipatory guidance given to patient.  See health maintenance. Colon cancer screening colonosc 5/08 , does not want to screen  Breast cancer screening - does not want to continue mammograms  Self breast exam no lumps  Flu vaccine 10/15 Tetanus vaccine - cannot afford / medicare does not pay  Pneumovax 1/04 will get prevnar today  Zoster vaccine 8/11  Declines dexa -was told with one of her surgeries that bones were "very strong"   Advance directive does not have living will (not POA)-given packet  Cognitive function addressed- see scanned forms- and if abnormal then additional documentation follows.   PMH and SH reviewed  Meds, vitals, and allergies reviewed.   ROS: See HPI.  Otherwise negative.    Wt is stable with bmi of 33  dexa status  bp is stable today  No cp or palpitations or headaches or edema  No side effects to medicines  BP Readings from Last 3  Encounters:  08/06/14 124/76  02/01/14 126/72  11/02/13 132/68     Hypothyroidism  Pt has no clinical changes No change in energy level/ hair or skin/ edema and no tremor Lab Results  Component Value Date   TSH 1.70 07/23/2013    Due for tsh today  DM 2 Controlled Lab Results  Component Value Date   HGBA1C 6.8* 01/25/2014  home glucose readings have been 150s-170s - does not think this has changed  She does follow DM diet   (a little different the past week due to her daughter cooking) Due for this today   opthy 1/16   On crestor for hyperlipidemia  Due for check today  Patient Active Problem List   Diagnosis Date Noted  . GERD (gastroesophageal reflux disease) 08/06/2014  . Encounter for Medicare annual wellness exam 08/03/2013  . Colon cancer screening 08/03/2013  . Thrombocytopenia, unspecified 08/03/2013  . Insomnia 01/26/2013  . Bee sting reaction 01/26/2013  . Hypothyroid 09/17/2011  . Lumbar spinal stenosis 06/21/2011    Class: Present on Admission  . Anemia, mild 05/25/2011  . BACK PAIN, LUMBAR 08/23/2010  . POSTNASAL DRIP 08/23/2010  . OSTEOARTHRITIS, HIP 03/16/2009  . MENOPAUSAL SYNDROME 12/08/2008  . CORNS AND CALLOSITIES 08/13/2007  . BASAL CELL CARCINOMA, FACE 07/01/2007  . BASAL CELL CARCINOMA, FACE 07/01/2007  . Diabetes type 2, controlled 09/24/2006  . DIABETIC PERIPHERAL NEUROPATHY 09/24/2006  . Hyperlipidemia 09/24/2006  . OBESITY NOS 09/24/2006  .  Essential hypertension 09/24/2006  . ROSACEA 09/24/2006  . INCONTINENCE, URGE 09/24/2006  . COLONOSCOPY AND REMOVAL OF LESION, HX OF 06/09/2003   Past Medical History  Diagnosis Date  . Hyperlipidemia   . Hypertension   . Hypothyroidism   . OA (osteoarthritis)   . Cancer     basal cell skin CA  . Diabetes mellitus     type II  . Blood transfusion    Past Surgical History  Procedure Laterality Date  . [place]       and BSO  . Tonsillectomy    . Mohs surgery  12/08    basal cell skin  cancer lt temple  . Joint replacement      rt total hip 10  . Ankle fracture surgery  10    rt  . Lumbar laminectomy/decompression microdiscectomy  06/22/2011    Procedure: LUMBAR LAMINECTOMY/DECOMPRESSION MICRODISCECTOMY;  Surgeon: Marybelle Killings;  Location: [facility];  Service: Orthopedics;  Laterality: N/A;  L3-4, L4-5 Decompression   History  Substance Use Topics  . Smoking status: Never Smoker   . Smokeless tobacco: Not on file  . Alcohol Use: No   Family History  Problem Relation Age of Onset  . Arthritis Father     RA  . Hypertension Father   . Heart disease Father     CAD  . Diabetes Father   . Arthritis Sister     RA  . Diabetes Brother   . Diabetes Brother   . Diabetes Brother    Allergies  Allergen Reactions  . Glipizide     Acid reflux   Current Outpatient Prescriptions on File Prior to Visit  Medication Sig Dispense Refill  . ACCU-CHEK FASTCLIX LANCETS MISC Check blood sugar once daily and as directed.Dx 250.00 102 each 3  . aspirin 81 MG tablet Take 81 mg by mouth daily.      . Blood Glucose Monitoring Suppl (ACCU-CHEK AVIVA PLUS) W/DEVICE KIT Check blood sugar once daily and as directed.Dx 250.00 1 kit 0  . diazepam (VALIUM) 5 MG tablet Take 0.5 tablets (2.5 mg total) by mouth at bedtime as needed. 30 tablet 0  . glimepiride (AMARYL) 2 MG tablet TAKE 1 TABLET (2 MG TOTAL) BY MOUTH DAILY BEFORE BREAKFAST. 30 tablet 4  . glucose blood (ACCU-CHEK AVIVA PLUS) test strip Check blood sugar once daily and as directed.Dx 250.00 100 each 3  . hydrochlorothiazide (HYDRODIURIL) 25 MG tablet TAKE 1 TABLET BY MOUTH EVERY MORNING 30 tablet 0  . levothyroxine (SYNTHROID, LEVOTHROID) 50 MCG tablet TAKE 1 TABLET BY MOUTH DAILY 30 tablet 1  . metFORMIN (GLUCOPHAGE) 1000 MG tablet TAKE 1 TABLET BY MOUTH 2 TIMES DAILY WITH A MEAL. 60 tablet 0  . metoprolol succinate (TOPROL-XL) 100 MG 24 hr tablet TAKE 1 TABLET BY MOUTH DAILY 30 tablet 5  . Omega-3 Fatty Acids (FISH OIL) 1200 MG  CAPS Take 1,200 mg by mouth 2 (two) times daily.      . rosuvastatin (CRESTOR) 10 MG tablet TAKE 1 TABLET BY MOUTH DAILY 30 tablet 1  . trandolapril (MAVIK) 4 MG tablet TAKE 1 TABLET TWICE A DAY 60 tablet 1   No current facility-administered medications on file prior to visit.    Review of Systems     Objective:   Physical Exam  Constitutional: She appears well-developed and well-nourished. No distress.  obese and well appearing   HENT:  Head: Normocephalic and atraumatic.  Right Ear: External ear normal.  Left Ear: External ear  normal.  Mouth/Throat: Oropharynx is clear and moist.  Eyes: Conjunctivae and EOM are normal. Pupils are equal, round, and reactive to light. No scleral icterus.  Neck: Normal range of motion. Neck supple. No JVD present. Carotid bruit is not present. No thyromegaly present.  Cardiovascular: Normal rate, regular rhythm, normal heart sounds and intact distal pulses.  Exam reveals no gallop.   Pulmonary/Chest: Effort normal and breath sounds normal. No respiratory distress. She has no wheezes. She exhibits no tenderness.  Abdominal: Soft. Bowel sounds are normal. She exhibits no distension, no abdominal bruit and no mass. There is no tenderness.  Genitourinary: No breast swelling, tenderness, discharge or bleeding.  Breast exam: No mass, nodules, thickening, tenderness, bulging, retraction, inflamation, nipple discharge or skin changes noted.  No axillary or clavicular LA.      Musculoskeletal: Normal range of motion. She exhibits no edema or tenderness.  Lymphadenopathy:    She has no cervical adenopathy.  Neurological: She is alert. She has normal reflexes. No cranial nerve deficit. She exhibits normal muscle tone. Coordination normal.  Skin: Skin is warm and dry. No rash noted. No erythema. No pallor.  Psychiatric: She has a normal mood and affect.          Assessment & Plan:   Problem List Items Addressed This Visit      Cardiovascular and  Mediastinum   Essential hypertension    bp in fair control at this time  BP Readings from Last 1 Encounters:  08/06/14 124/76   No changes needed Disc lifstyle change with low sodium diet and exercise  Lab today        Digestive   GERD (gastroesophageal reflux disease)    Pt having nausea/heartburn and burping - daily  Trial of zantac 150 bid -update if no improvement  Also disc diet for gerd      Relevant Medications   ranitidine (ZANTAC) capsule     Endocrine   Diabetes type 2, controlled    A1C today On amaryl and metformin  Disc diet/lifestyle habits       Hypothyroid    No clinical change tsh today No goiter on exam         Other   Encounter for Medicare annual wellness exam - Primary    Reviewed health habits including diet and exercise and skin cancer prevention Reviewed appropriate screening tests for age  Also reviewed health mt list, fam hx and immunization status , as well as social and family history   prevnar vaccine today See HPI  Lab today  Disc need to work on advanced directive = and given materials to do so       Hyperlipidemia    Lipid panel today  Disc goals for lipids and reasons to control them Rev labs with pt from last draw  Rev low sat fat diet in detail  crestor and diet

## 2014-08-06 NOTE — Patient Instructions (Signed)
Labs today -pending results  Try zantac (ranitidine) 150 mg twice daily for stomach issues/nausea/reflux - let me know if it does not help  prevnar vaccine today You are due for a tetanus shot -get that at the health dept. Don't forget to work on an advanced directive/ living will   Take care of yourself   If all is ok - follow up in 6 months with labs prior

## 2014-08-07 ENCOUNTER — Other Ambulatory Visit: Payer: Self-pay | Admitting: Family Medicine

## 2014-08-07 NOTE — Assessment & Plan Note (Signed)
Pt having nausea/heartburn and burping - daily  Trial of zantac 150 bid -update if no improvement  Also disc diet for gerd

## 2014-08-07 NOTE — Assessment & Plan Note (Signed)
bp in fair control at this time  BP Readings from Last 1 Encounters:  08/06/14 124/76   No changes needed Disc lifstyle change with low sodium diet and exercise  Lab today

## 2014-08-07 NOTE — Assessment & Plan Note (Signed)
A1C today On amaryl and metformin  Disc diet/lifestyle habits

## 2014-08-07 NOTE — Assessment & Plan Note (Signed)
No clinical change tsh today No goiter on exam

## 2014-08-07 NOTE — Assessment & Plan Note (Signed)
Lipid panel today  Disc goals for lipids and reasons to control them Rev labs with pt from last draw  Rev low sat fat diet in detail  crestor and diet

## 2014-08-07 NOTE — Assessment & Plan Note (Signed)
Reviewed health habits including diet and exercise and skin cancer prevention Reviewed appropriate screening tests for age  Also reviewed health mt list, fam hx and immunization status , as well as social and family history   prevnar vaccine today See HPI  Lab today  Disc need to work on advanced directive = and given materials to do so

## 2014-08-09 ENCOUNTER — Other Ambulatory Visit: Payer: Self-pay | Admitting: Family Medicine

## 2014-08-10 ENCOUNTER — Encounter: Payer: Self-pay | Admitting: *Deleted

## 2014-09-07 ENCOUNTER — Other Ambulatory Visit: Payer: Self-pay | Admitting: Family Medicine

## 2014-09-10 ENCOUNTER — Other Ambulatory Visit: Payer: Self-pay | Admitting: Family Medicine

## 2015-02-02 ENCOUNTER — Telehealth: Payer: Self-pay | Admitting: Family Medicine

## 2015-02-02 DIAGNOSIS — E119 Type 2 diabetes mellitus without complications: Secondary | ICD-10-CM

## 2015-02-02 DIAGNOSIS — D649 Anemia, unspecified: Secondary | ICD-10-CM

## 2015-02-02 DIAGNOSIS — E785 Hyperlipidemia, unspecified: Secondary | ICD-10-CM

## 2015-02-02 DIAGNOSIS — I10 Essential (primary) hypertension: Secondary | ICD-10-CM

## 2015-02-02 NOTE — Telephone Encounter (Signed)
-----   Message from Ellamae Sia sent at 01/26/2015 11:29 AM EDT ----- Regarding: Lab orders for Thursday, 7.28.16 Lab orders for a 6 month follow up appt

## 2015-02-03 ENCOUNTER — Other Ambulatory Visit (INDEPENDENT_AMBULATORY_CARE_PROVIDER_SITE_OTHER): Payer: Commercial Managed Care - HMO

## 2015-02-03 DIAGNOSIS — I10 Essential (primary) hypertension: Secondary | ICD-10-CM

## 2015-02-03 DIAGNOSIS — E119 Type 2 diabetes mellitus without complications: Secondary | ICD-10-CM | POA: Diagnosis not present

## 2015-02-03 DIAGNOSIS — E785 Hyperlipidemia, unspecified: Secondary | ICD-10-CM | POA: Diagnosis not present

## 2015-02-03 LAB — COMPREHENSIVE METABOLIC PANEL
ALT: 20 U/L (ref 0–35)
AST: 30 U/L (ref 0–37)
Albumin: 3.3 g/dL — ABNORMAL LOW (ref 3.5–5.2)
Alkaline Phosphatase: 70 U/L (ref 39–117)
BUN: 24 mg/dL — ABNORMAL HIGH (ref 6–23)
CO2: 25 mEq/L (ref 19–32)
Calcium: 9.1 mg/dL (ref 8.4–10.5)
Chloride: 109 mEq/L (ref 96–112)
Creatinine, Ser: 0.94 mg/dL (ref 0.40–1.20)
GFR: 60.34 mL/min (ref >60.00)
Glucose, Bld: 157 mg/dL — ABNORMAL HIGH (ref 70–99)
Potassium: 4.2 mEq/L (ref 3.5–5.1)
Sodium: 141 mEq/L (ref 135–145)
Total Bilirubin: 0.7 mg/dL (ref 0.2–1.2)
Total Protein: 6.3 g/dL (ref 6.0–8.3)

## 2015-02-03 LAB — LIPID PANEL
Cholesterol: 102 mg/dL (ref 0–200)
HDL: 28.8 mg/dL — ABNORMAL LOW (ref >39.00)
LDL Cholesterol: 55 mg/dL (ref 0–99)
NonHDL: 72.92
Total CHOL/HDL Ratio: 4
Triglycerides: 92 mg/dL (ref 0.0–149.0)
VLDL: 18.4 mg/dL (ref 0.0–40.0)

## 2015-02-03 LAB — CBC WITH DIFFERENTIAL/PLATELET
Basophils Absolute: 0 10*3/uL (ref 0.0–0.1)
Basophils Relative: 0.6 % (ref 0.0–3.0)
Eosinophils Absolute: 0.1 10*3/uL (ref 0.0–0.7)
Eosinophils Relative: 2.3 % (ref 0.0–5.0)
HCT: 31.1 % — ABNORMAL LOW (ref 36.0–46.0)
Hemoglobin: 10.4 g/dL — ABNORMAL LOW (ref 12.0–15.0)
Lymphocytes Relative: 32 % (ref 12.0–46.0)
Lymphs Abs: 1.8 10*3/uL (ref 0.7–4.0)
MCHC: 33.5 g/dL (ref 30.0–36.0)
MCV: 98 fl (ref 78.0–100.0)
Monocytes Absolute: 0.5 10*3/uL (ref 0.1–1.0)
Monocytes Relative: 9.1 % (ref 3.0–12.0)
Neutro Abs: 3.2 10*3/uL (ref 1.4–7.7)
Neutrophils Relative %: 56 % (ref 43.0–77.0)
Platelets: 96 10*3/uL — ABNORMAL LOW (ref 150.0–400.0)
RBC: 3.18 Mil/uL — ABNORMAL LOW (ref 3.87–5.11)
RDW: 15.2 % (ref 11.5–15.5)
WBC: 5.7 10*3/uL (ref 4.0–10.5)

## 2015-02-03 LAB — HEMOGLOBIN A1C: HEMOGLOBIN A1C: 7.9 % — AB (ref 4.6–6.5)

## 2015-02-11 ENCOUNTER — Encounter: Payer: Self-pay | Admitting: Family Medicine

## 2015-02-11 ENCOUNTER — Ambulatory Visit (INDEPENDENT_AMBULATORY_CARE_PROVIDER_SITE_OTHER): Payer: Commercial Managed Care - HMO | Admitting: Family Medicine

## 2015-02-11 VITALS — BP 138/62 | HR 57 | Temp 98.2°F | Ht 61.0 in | Wt 172.8 lb

## 2015-02-11 DIAGNOSIS — E119 Type 2 diabetes mellitus without complications: Secondary | ICD-10-CM | POA: Diagnosis not present

## 2015-02-11 DIAGNOSIS — D649 Anemia, unspecified: Secondary | ICD-10-CM | POA: Insufficient documentation

## 2015-02-11 DIAGNOSIS — I10 Essential (primary) hypertension: Secondary | ICD-10-CM

## 2015-02-11 DIAGNOSIS — D696 Thrombocytopenia, unspecified: Secondary | ICD-10-CM

## 2015-02-11 DIAGNOSIS — E669 Obesity, unspecified: Secondary | ICD-10-CM

## 2015-02-11 NOTE — Patient Instructions (Signed)
Please get back to a diabetic diet and stay as active as you can  Make an appointment with Dr Ledell Peoples office for your skin spots  Stop at check out -we will refer you to hematology for anemia and low platelet count  Take care of yourself   Follow up with me in 3 months with labs prior

## 2015-02-11 NOTE — Progress Notes (Signed)
Pre visit review using our clinic review tool, if applicable. No additional management support is needed unless otherwise documented below in the visit note. 

## 2015-02-11 NOTE — Progress Notes (Signed)
Subjective:    Patient ID: Stacey Harris, female    DOB: 30-May-1929, 81 y.o.   MRN: [id number]  HPI Here for f/u of chronic medical problems   Has chronic pain - in back and hips  Has seen orthopedics - has had bursitis  Has had a cortisone shot in the past close to a year ago   Wt is down 2 lb with bmi of 32 Obese range but improving  Diabetes Home sugar results - up and down - for example 160s - average  DM diet - not good / not on a diabetic diet ("I am trying to") - now fresh vegetable  In the past - she binged on things to figure out what she wanted  Exercise - not a lot due to chronic pain/ trying to work in the garden and cuts her grass Symptoms-none  A1C last  Lab Results  Component Value Date   HGBA1C 7.9* 02/03/2015   This is down from 8.1  No problems with medications -on metformin and amaryl Renal protection on ace  Last eye exam 1/16   Lipids On crestor Lab Results  Component Value Date   CHOL 102 02/03/2015   CHOL 116 08/06/2014   CHOL 113 01/25/2014   Lab Results  Component Value Date   HDL 28.80* 02/03/2015   HDL 33.30* 08/06/2014   HDL 37.20* 01/25/2014   Lab Results  Component Value Date   LDLCALC 55 02/03/2015   LDLCALC 62 08/06/2014   [place] 60 01/25/2014   Lab Results  Component Value Date   TRIG 92.0 02/03/2015   TRIG 105.0 08/06/2014   TRIG 77.0 01/25/2014   Lab Results  Component Value Date   CHOLHDL 4 02/03/2015   CHOLHDL 3 08/06/2014   CHOLHDL 3 01/25/2014   Lab Results  Component Value Date   LDLDIRECT 202.7 08/17/2010   HDL is too low - she has not been able to exercise  occ tries to ride her bike   bp is stable today  No cp or palpitations or headaches or edema  No side effects to medicines  BP Readings from Last 3 Encounters:  02/11/15 138/62  08/06/14 124/76  02/01/14 126/72     Anemia Is worse  Lab Results  Component Value Date   WBC 5.7 02/03/2015   HGB 10.4* 02/03/2015   HCT 31.1* 02/03/2015   MCV 98.0 02/03/2015   PLT 96.0* 02/03/2015    IFOB nl in march   Both HB and platelets are down  No easy bruising or bleeding   She needs to f/u with DR Allyson Sabal for lesion on forehead and R shoulder   Patient Active Problem List   Diagnosis Date Noted  . GERD (gastroesophageal reflux disease) 08/06/2014  . Encounter for Medicare annual wellness exam 08/03/2013  . Colon cancer screening 08/03/2013  . Thrombocytopenia 08/03/2013  . Insomnia 01/26/2013  . Bee sting reaction 01/26/2013  . Hypothyroid 09/17/2011  . Lumbar spinal stenosis 06/21/2011    Class: Present on Admission  . Anemia, mild 05/25/2011  . BACK PAIN, LUMBAR 08/23/2010  . POSTNASAL DRIP 08/23/2010  . OSTEOARTHRITIS, HIP 03/16/2009  . MENOPAUSAL SYNDROME 12/08/2008  . CORNS AND CALLOSITIES 08/13/2007  . BASAL CELL CARCINOMA, FACE 07/01/2007  . BASAL CELL CARCINOMA, FACE 07/01/2007  . Diabetes type 2, controlled 09/24/2006  . DIABETIC PERIPHERAL NEUROPATHY 09/24/2006  . Hyperlipidemia 09/24/2006  . OBESITY NOS 09/24/2006  . Essential hypertension 09/24/2006  . ROSACEA 09/24/2006  . INCONTINENCE, URGE 09/24/2006  .  COLONOSCOPY AND REMOVAL OF LESION, HX OF 06/09/2003   Past Medical History  Diagnosis Date  . Hyperlipidemia   . Hypertension   . Hypothyroidism   . OA (osteoarthritis)   . Cancer     basal cell skin CA  . Diabetes mellitus     type II  . Blood transfusion    Past Surgical History  Procedure Laterality Date  . [place]       and BSO  . Tonsillectomy    . Mohs surgery  12/08    basal cell skin cancer lt temple  . Joint replacement      rt total hip 10  . Ankle fracture surgery  10    rt  . Lumbar laminectomy/decompression microdiscectomy  06/22/2011    Procedure: LUMBAR LAMINECTOMY/DECOMPRESSION MICRODISCECTOMY;  Surgeon: Marybelle Killings;  Location: [place];  Service: Orthopedics;  Laterality: N/A;  L3-4, L4-5 Decompression   History  Substance Use Topics  . Smoking status: Never Smoker   .  Smokeless tobacco: Not on file  . Alcohol Use: No   Family History  Problem Relation Age of Onset  . Arthritis Father     RA  . Hypertension Father   . Heart disease Father     CAD  . Diabetes Father   . Arthritis Sister     RA  . Diabetes Brother   . Diabetes Brother   . Diabetes Brother    Allergies  Allergen Reactions  . Glipizide     Acid reflux   Current Outpatient Prescriptions on File Prior to Visit  Medication Sig Dispense Refill  . ACCU-CHEK FASTCLIX LANCETS MISC Check blood sugar once daily and as directed.Dx 250.00 102 each 3  . aspirin 81 MG tablet Take 81 mg by mouth daily.      . Blood Glucose Monitoring Suppl (ACCU-CHEK AVIVA PLUS) W/DEVICE KIT Check blood sugar once daily and as directed.Dx 250.00 1 kit 0  . CRESTOR 10 MG tablet TAKE 1 TABLET EVERY DAY 90 tablet 3  . diazepam (VALIUM) 5 MG tablet Take 0.5 tablets (2.5 mg total) by mouth at bedtime as needed. 30 tablet 0  . glimepiride (AMARYL) 2 MG tablet TAKE 1 TABLET BY MOUTH EVERY MORNING BEFORE BREAKFAST 30 tablet 4  . glucose blood (ACCU-CHEK AVIVA PLUS) test strip Check blood sugar once daily and as directed.Dx 250.00 100 each 3  . hydrochlorothiazide (HYDRODIURIL) 25 MG tablet TAKE 1 TABLET EVERY MORNING 90 tablet 1  . levothyroxine (SYNTHROID, LEVOTHROID) 50 MCG tablet TAKE 1 TABLET EVERY DAY 90 tablet 1  . metFORMIN (GLUCOPHAGE) 1000 MG tablet TAKE 1 TABLET TWICE DAILY WITH A MEAL 180 tablet 1  . metoprolol succinate (TOPROL-XL) 100 MG 24 hr tablet TAKE 1 TABLET BY MOUTH DAILY 30 tablet 5  . metoprolol succinate (TOPROL-XL) 100 MG 24 hr tablet TAKE 1 TABLET BY MOUTH DAILY 30 tablet 4  . Omega-3 Fatty Acids (FISH OIL) 1200 MG CAPS Take 1,200 mg by mouth 2 (two) times daily.      . trandolapril (MAVIK) 4 MG tablet TAKE 1 TABLET TWICE DAILY 180 tablet 1   No current facility-administered medications on file prior to visit.     Review of Systems Review of Systems  Constitutional: Negative for fever,  appetite change,  and unexpected weight change. pos for fatigue  Eyes: Negative for pain and visual disturbance.  Respiratory: Negative for cough and shortness of breath.   Cardiovascular: Negative for cp or palpitations    Gastrointestinal:  Negative for nausea, diarrhea and constipation.  Genitourinary: Negative for urgency and frequency.  Skin: Negative for pallor or rash  neg for bleeding or bruising pos for new lesions no forehead and shoulder (hx of skin cancer) MSK pos for chronic back pain that limits mobility Neurological: Negative for weakness, light-headedness, numbness and headaches.  Hematological: Negative for adenopathy. Does not bruise/bleed easily.  Psychiatric/Behavioral: Negative for dysphoric mood. The patient is not nervous/anxious.         Objective:   Physical Exam  Constitutional: She appears well-developed and well-nourished. No distress.  obese and well appearing    HENT:  Head: Normocephalic and atraumatic.  Mouth/Throat: Oropharynx is clear and moist.  Eyes: Conjunctivae and EOM are normal. Pupils are equal, round, and reactive to light.  Neck: Normal range of motion. Neck supple. No JVD present. Carotid bruit is not present. No thyromegaly present.  Cardiovascular: Normal rate, regular rhythm, normal heart sounds and intact distal pulses.  Exam reveals no gallop.   Pulmonary/Chest: Effort normal and breath sounds normal. No respiratory distress. She has no wheezes. She has no rales.  No crackles  Abdominal: Soft. Bowel sounds are normal. She exhibits no distension, no abdominal bruit and no mass. There is no tenderness. There is no rebound and no guarding.  Musculoskeletal: She exhibits no edema.  Slow gait due to back pain  Some difficulty getting on the table   Lymphadenopathy:    She has no cervical adenopathy.  Neurological: She is alert. She has normal reflexes. No cranial nerve deficit. She exhibits normal muscle tone. Coordination normal.  Skin:  Skin is warm and dry. No rash noted.  Crusted raised 3 mm skin lesion L shoulder 1 cm area of erythema and scale on L forehead  Psychiatric: She has a normal mood and affect.          Assessment & Plan:   Problem List Items Addressed This Visit    Diabetes type 2, controlled    Lab Results  Component Value Date   HGBA1C 7.9* 02/03/2015   This is slowly improving  Apprehensive to inc or add med due to hypoglycemia Rev low glycemic diet  Limited exercise due to chronic pain Enc wt loss       Essential hypertension - Primary    bp in fair control at this time  BP Readings from Last 1 Encounters:  02/11/15 138/62   No changes needed Disc lifstyle change with low sodium diet and exercise  Labs reviewed       Normocytic anemia    This is worse w/o known cause Also thrombocytopenia (no bruising or bleeding) No known renal dz or GI issues  IFob kit nl in march Pt is fatigued  Refer to hematology for further eval       Obesity    Discussed how this problem influences overall health and the risks it imposes  Reviewed plan for weight loss with lower calorie diet (via better food choices and also portion control or program like weight watchers) and exercise building up to or more than 30 minutes 5 days per week including some aerobic activity   Enc to keep working on wt loss       Thrombocytopenia    With anemia  No bruising or bleeding Ref to hematology  Ct is below 100 now       Relevant Orders   Ambulatory referral to Hematology

## 2015-02-12 NOTE — Assessment & Plan Note (Signed)
With anemia  No bruising or bleeding Ref to hematology  Ct is below 100 now

## 2015-02-12 NOTE — Assessment & Plan Note (Signed)
Lab Results  Component Value Date   HGBA1C 7.9* 02/03/2015   This is slowly improving  Apprehensive to inc or add med due to hypoglycemia Rev low glycemic diet  Limited exercise due to chronic pain Enc wt loss

## 2015-02-12 NOTE — Assessment & Plan Note (Signed)
bp in fair control at this time  BP Readings from Last 1 Encounters:  02/11/15 138/62   No changes needed Disc lifstyle change with low sodium diet and exercise  Labs reviewed

## 2015-02-12 NOTE — Assessment & Plan Note (Signed)
Discussed how this problem influences overall health and the risks it imposes  Reviewed plan for weight loss with lower calorie diet (via better food choices and also portion control or program like weight watchers) and exercise building up to or more than 30 minutes 5 days per week including some aerobic activity  Enc to keep working on wt loss  

## 2015-02-12 NOTE — Assessment & Plan Note (Signed)
This is worse w/o known cause Also thrombocytopenia (no bruising or bleeding) No known renal dz or GI issues  IFob kit nl in march Pt is fatigued  Refer to hematology for further eval

## 2015-02-28 ENCOUNTER — Encounter: Payer: Self-pay | Admitting: Hematology

## 2015-02-28 ENCOUNTER — Other Ambulatory Visit (HOSPITAL_BASED_OUTPATIENT_CLINIC_OR_DEPARTMENT_OTHER): Payer: Medicare HMO

## 2015-02-28 ENCOUNTER — Ambulatory Visit: Payer: Medicare HMO

## 2015-02-28 ENCOUNTER — Ambulatory Visit (HOSPITAL_BASED_OUTPATIENT_CLINIC_OR_DEPARTMENT_OTHER): Payer: Commercial Managed Care - HMO

## 2015-02-28 ENCOUNTER — Telehealth: Payer: Self-pay | Admitting: Hematology

## 2015-02-28 ENCOUNTER — Ambulatory Visit (HOSPITAL_BASED_OUTPATIENT_CLINIC_OR_DEPARTMENT_OTHER): Payer: Commercial Managed Care - HMO | Admitting: Hematology

## 2015-02-28 VITALS — BP 159/70 | HR 70 | Temp 98.1°F | Resp 18 | Ht 61.0 in | Wt 171.5 lb

## 2015-02-28 DIAGNOSIS — D649 Anemia, unspecified: Secondary | ICD-10-CM

## 2015-02-28 DIAGNOSIS — E039 Hypothyroidism, unspecified: Secondary | ICD-10-CM

## 2015-02-28 DIAGNOSIS — D696 Thrombocytopenia, unspecified: Secondary | ICD-10-CM

## 2015-02-28 DIAGNOSIS — D469 Myelodysplastic syndrome, unspecified: Secondary | ICD-10-CM

## 2015-02-28 DIAGNOSIS — D6489 Other specified anemias: Secondary | ICD-10-CM | POA: Diagnosis not present

## 2015-02-28 LAB — CBC & DIFF AND RETIC
BASO%: 0.7 % (ref 0.0–2.0)
BASOS ABS: 0 10*3/uL (ref 0.0–0.1)
EOS%: 2.3 % (ref 0.0–7.0)
Eosinophils Absolute: 0.1 10*3/uL (ref 0.0–0.5)
HEMATOCRIT: 32.5 % — AB (ref 34.8–46.6)
HGB: 10.8 g/dL — ABNORMAL LOW (ref 11.6–15.9)
Immature Retic Fract: 14.6 % — ABNORMAL HIGH (ref 1.60–10.00)
LYMPH#: 1.6 10*3/uL (ref 0.9–3.3)
LYMPH%: 27.8 % (ref 14.0–49.7)
MCH: 32.5 pg (ref 25.1–34.0)
MCHC: 33.2 g/dL (ref 31.5–36.0)
MCV: 97.9 fL (ref 79.5–101.0)
MONO#: 0.5 10*3/uL (ref 0.1–0.9)
MONO%: 8.5 % (ref 0.0–14.0)
NEUT#: 3.5 10*3/uL (ref 1.5–6.5)
NEUT%: 60.7 % (ref 38.4–76.8)
RBC: 3.32 10*6/uL — AB (ref 3.70–5.45)
RDW: 15 % — AB (ref 11.2–14.5)
RETIC CT ABS: 69.72 10*3/uL (ref 33.70–90.70)
Retic %: 2.1 % (ref 0.70–2.10)
WBC: 5.8 10*3/uL (ref 3.9–10.3)

## 2015-02-28 LAB — LACTATE DEHYDROGENASE (CC13): LDH: 231 U/L (ref 125–245)

## 2015-02-28 LAB — FERRITIN CHCC: FERRITIN: 81 ng/mL (ref 9–269)

## 2015-02-28 LAB — CHCC SMEAR

## 2015-02-28 LAB — IRON AND TIBC CHCC
%SAT: 32 % (ref 21–57)
IRON: 93 ug/dL (ref 41–142)
TIBC: 291 ug/dL (ref 236–444)
UIBC: 198 ug/dL (ref 120–384)

## 2015-02-28 NOTE — Telephone Encounter (Signed)
Gave and pritned appt sched and avs for pt for OCT

## 2015-02-28 NOTE — Progress Notes (Signed)
[name]    HEMATOLOGY/ONCOLOGY CONSULTATION NOTE  Date of Service: 02/28/2015  Patient Care Team: [name], MD as PCP - General  CHIEF COMPLAINTS/PURPOSE OF CONSULTATION:  Evaluation and management of anemia and thrombocytopenia.  HISTORY OF PRESENTING ILLNESS:   Stacey Harris is a wonderful 81 y.o. female who has been referred to [place] by Dr .Loura Pardon, MD for evaluation and management of anemia and thrombocytopenia.  Mrs. Bigos is a delightful 81 year old lady with a history of hypertension, dyslipidemia, diabetes, osteoarthritis who has been referred to [place] for evaluation and management of thrombocytopenia. Her platelet counts are noted to be somewhat low from at least December 2012 when there were 136,000 then 102,000 in January 2015 and since then her platelet counts have been in the 100 to 120s thousand range without any concerns with bleeding. Patient notes no issues with overt bleeding. No hematemesis, hematuria, melena, epistaxis or large ecchymosis.  Her hemoglobin levels have also been dropping gradually since Gen. 2015 when it was 11.7 and currently is down to the mid 10 range with a high normal MCV.  she has not received any recent blood transfusions but did have a blood transfusion in 2008 for anemia.  WbC counts have remained stable.  Patient reports some element of chronic fatigue. No frequent infections.  No acute recent infections. No known chronic inflammatory states. No known history of kidney or liver problems. Has a history of hypothyroidism for which she is on replacement.       MEDICAL HISTORY:  Past Medical History  Diagnosis Date  . Hyperlipidemia   . Hypertension   . Hypothyroidism   . OA (osteoarthritis)   . Cancer     basal cell skin CA  . Diabetes mellitus     type II  . Blood transfusion   . Patient Active Problem List   Diagnosis Date Noted  . Normocytic anemia 02/11/2015  . GERD (gastroesophageal reflux disease) 08/06/2014  .  Encounter for Medicare annual wellness exam 08/03/2013  . Colon cancer screening 08/03/2013  . Thrombocytopenia 08/03/2013  . Insomnia 01/26/2013  . Bee sting reaction 01/26/2013  . Hypothyroid 09/17/2011  . Lumbar spinal stenosis 06/21/2011    Class: Present on Admission  . Anemia, mild 05/25/2011  . BACK PAIN, LUMBAR 08/23/2010  . POSTNASAL DRIP 08/23/2010  . OSTEOARTHRITIS, HIP 03/16/2009  . MENOPAUSAL SYNDROME 12/08/2008  . CORNS AND CALLOSITIES 08/13/2007  . BASAL CELL CARCINOMA, FACE 07/01/2007  . BASAL CELL CARCINOMA, FACE 07/01/2007  . Diabetes type 2, controlled 09/24/2006  . DIABETIC PERIPHERAL NEUROPATHY 09/24/2006  . Hyperlipidemia 09/24/2006  . Obesity 09/24/2006  . Essential hypertension 09/24/2006  . ROSACEA 09/24/2006  . INCONTINENCE, URGE 09/24/2006  . COLONOSCOPY AND REMOVAL OF LESION, HX OF 06/09/2003     SURGICAL HISTORY: Past Surgical History  Procedure Laterality Date  . [place]       and BSO  . Tonsillectomy    . Mohs surgery  12/08    basal cell skin cancer lt temple  . Joint replacement      rt total hip 10  . Ankle fracture surgery  10    rt  . Lumbar laminectomy/decompression microdiscectomy  06/22/2011    Procedure: LUMBAR LAMINECTOMY/DECOMPRESSION MICRODISCECTOMY;  Surgeon: Marybelle Killings;  Location: [place];  Service: Orthopedics;  Laterality: N/A;  L3-4, L4-5 Decompression    SOCIAL HISTORY: Social History   Social History  . Marital Status: Widowed    Spouse Name: N/A  . Number of Children:  N/A  . Years of Education: N/A   Occupational History  . Not on file.   Social History Main Topics  . Smoking status: Never Smoker   . Smokeless tobacco: Not on file  . Alcohol Use: No  . Drug Use: No  . Sexual Activity: Yes    Birth Control/ Protection: Post-menopausal   Other Topics Concern  . Not on file   Social History Narrative    FAMILY HISTORY: Family History  Problem Relation Age of Onset  . Arthritis Father     RA  .  Hypertension Father   . Heart disease Father     CAD  . Diabetes Father   . Arthritis Sister     RA  . Diabetes Brother   . Diabetes Brother   . Diabetes Brother     ALLERGIES:  has No Known Allergies.  MEDICATIONS:  Current Outpatient Prescriptions  Medication Sig Dispense Refill  . ACCU-CHEK FASTCLIX LANCETS MISC Check blood sugar once daily and as directed.Dx 250.00 102 each 3  . aspirin 81 MG tablet Take 81 mg by mouth daily.      . Blood Glucose Calibration (ACCU-CHEK SMARTVIEW CONTROL) LIQD     . Blood Glucose Monitoring Suppl (ACCU-CHEK AVIVA PLUS) W/DEVICE KIT Check blood sugar once daily and as directed.Dx 250.00 1 kit 0  . CRESTOR 10 MG tablet TAKE 1 TABLET EVERY DAY 90 tablet 3  . diazepam (VALIUM) 5 MG tablet Take 0.5 tablets (2.5 mg total) by mouth at bedtime as needed. 30 tablet 0  . glimepiride (AMARYL) 2 MG tablet TAKE 1 TABLET BY MOUTH EVERY MORNING BEFORE BREAKFAST 30 tablet 4  . glucose blood (ACCU-CHEK AVIVA PLUS) test strip Check blood sugar once daily and as directed.Dx 250.00 100 each 3  . hydrochlorothiazide (HYDRODIURIL) 25 MG tablet TAKE 1 TABLET EVERY MORNING 90 tablet 1  . levothyroxine (SYNTHROID, LEVOTHROID) 50 MCG tablet TAKE 1 TABLET EVERY DAY 90 tablet 1  . metFORMIN (GLUCOPHAGE) 1000 MG tablet TAKE 1 TABLET TWICE DAILY WITH A MEAL 180 tablet 1  . metoprolol succinate (TOPROL-XL) 100 MG 24 hr tablet TAKE 1 TABLET BY MOUTH DAILY 30 tablet 5  . metoprolol succinate (TOPROL-XL) 100 MG 24 hr tablet TAKE 1 TABLET BY MOUTH DAILY 30 tablet 4  . Omega-3 Fatty Acids (FISH OIL) 1200 MG CAPS Take 1,200 mg by mouth 2 (two) times daily.      . trandolapril (MAVIK) 4 MG tablet TAKE 1 TABLET TWICE DAILY 180 tablet 1   No current facility-administered medications for this visit.    REVIEW OF SYSTEMS:    10 Point review of Systems was done is negative except as noted above.  PHYSICAL EXAMINATION: ECOG PERFORMANCE STATUS: 1 - Symptomatic but completely  ambulatory  . Filed Vitals:   02/28/15 1111  Height: _0  (1.549 m)  Weight: 171 lb 8 oz (77.792 kg)   Filed Weights   02/28/15 1111  Weight: 171 lb 8 oz (77.792 kg)   .Body mass index is 32.42 kg/(m^2).  GENERAL:alert, in no acute distress and comfortable SKIN: skin color, texture, turgor are normal, no rashes or significant lesions EYES: normal, conjunctiva are pink and non-injected, sclera clear OROPHARYNX:no exudate, no erythema and lips, buccal mucosa, and tongue normal  NECK: supple, no JVD, thyroid normal size, non-tender, without nodularity LYMPH:  no palpable lymphadenopathy in the cervical, axillary or inguinal LUNGS: clear to auscultation with normal respiratory effort HEART: regular rate & rhythm,  no murmurs and no  lower extremity edema ABDOMEN: abdomen soft, non-tender, normoactive bowel sounds  Musculoskeletal: no cyanosis of digits and no clubbing  PSYCH: alert & oriented x 3 with fluent speech NEURO: no focal motor/sensory deficits  LABORATORY DATA:  I have reviewed the data as listed  . CBC Latest Ref Rng 02/03/2015 08/06/2014 01/25/2014  WBC 4.0 - 10.5 K/uL 5.7 5.4 5.8  Hemoglobin 12.0 - 15.0 g/dL 10.4(L) 11.5(L) 11.0(L)  Hematocrit 36.0 - 46.0 % 31.1(L) 34.5(L) 32.8(L)  Platelets 150.0 - 400.0 K/uL 96.0(L) 114.0(L) 127.0(L)    . CMP Latest Ref Rng 02/03/2015 08/06/2014 01/25/2014  Glucose 70 - 99 mg/dL 157(H) 149(H) 119(H)  BUN 6 - 23 mg/dL 24(H) 35(H) 30(H)  Creatinine 0.40 - 1.20 mg/dL 0.94 1.09 1.1  Sodium 135 - 145 mEq/L 141 142 142  Potassium 3.5 - 5.1 mEq/L 4.2 4.0 3.8  Chloride 96 - 112 mEq/L 109 108 110  CO2 19 - 32 mEq/L _0 Calcium 8.4 - 10.5 mg/dL 9.1 10.4 9.4  Total Protein 6.0 - 8.3 g/dL 6.3 6.8 6.5  Total Bilirubin 0.2 - 1.2 mg/dL 0.7 0.7 1.0  Alkaline Phos 39 - 117 U/L 70 63 57  AST 0 - 37 U/L 30 29 36  ALT 0 - 35 U/L _1 . Lab Results  Component Value Date   TOTALPROTELP 6.6 02/28/2015   ALBUMINELP 3.6* 02/28/2015     A1GS 0.3 02/28/2015   A2GS 0.8 02/28/2015   BETS 0.5 02/28/2015   BETA2SER 0.5 02/28/2015   GAMS 1.0 02/28/2015   SPEI * 02/28/2015   (this displays SPEP labs)  No results found for: KPAFRELGTCHN, LAMBDASER, KAPLAMBRATIO (kappa/lambda light chains)  No monoclonal protein noted.  . Lab Results  Component Value Date   IRON 93 02/28/2015   TIBC 291 02/28/2015   IRONPCTSAT 32 02/28/2015   (Iron and TIBC)  Lab Results  Component Value Date   FERRITIN 81 02/28/2015   . Lab Results  Component Value Date   LDH 231 02/28/2015    Haptoglobin 174 B12 1410      Peripheral blood smear: (03/03/2015):   Predominant population of rbc's looks macrocytic, several dysplastic neutrophils, some platelet clumping with increased number of large platelets. No schistocytes. No blasts. Overall picture concerning for myelodysplastic syndrome. Platelet counts likely underestimated due to some platelet clumping.         RADIOGRAPHIC STUDIES: I have personally reviewed the radiological images as listed and agreed with the findings in the report. No results found.         ASSESSMENT & PLAN:    Mrs. Snoke is a very sweet 81 year old female with  1) Chronic anemia. Normocytic but with high normal MCV levels. No evidence of hemolysis on peripheral blood smear or labs. Peripheral blood smear shows macrocytic RBC population. Normal B12. No history of alcohol. Hypothyroidism is well treated. Presence of large platelets and dysplastic WBCs in addition to macrocytic RBCs is consistent with the possibility of myelodysplastic syndrome. 2) mild Thrombocytopenia -the counts are being underestimated at least partly due to platelet clumping on the smear as noted above. It is also partly due to her presentation of myelodysplastic syndrome. Plan -No indication for blood transfusion or platelet transfusions at this time. -Will monitor blood counts at this time. [facility] consider doing  bone marrow biopsy if her blood counts start to worsen to check for specifics of her myelodysplastic syndrome especially cytogenetics. -Would check erythropoietin level and especially if under 250 might benefit  from ESA's to maintain hemoglobin between 10 and 11. -Return to care with Dr. Irene Limbo in 2 months with repeat blood counts CBC, CMP. -Patient has been given our clinic card to call earlier if any new questions or concerns arise.  All of the patients questions were answered to her apparent satisfaction. The patient knows to call the clinic with any problems, questions or concerns.  I spent 40 minutes counseling the patient face to face. The total time spent in the appointment was 60 minutes and more than 50% was on counseling and direct patient cares.    Sullivan Lone MD [place] AAHIVMS [facility] Hematology/Oncology Physician [facility]  (Office):       [phone] (Work cell):  [phone] (Fax):           [phone]  02/28/2015 11:21 AM

## 2015-02-28 NOTE — Progress Notes (Signed)
Checked in new patient for blood concern. Pt made copay of $45 via [phone]. Receipt given. Pt may not need my services at this time.

## 2015-03-02 ENCOUNTER — Telehealth: Payer: Self-pay

## 2015-03-02 NOTE — Telephone Encounter (Signed)
V/M left requesting refill all meds; left v/m requesting cb. Pt last seen 02/11/15.

## 2015-03-04 LAB — SPEP & IFE WITH QIG
Albumin ELP: 3.6 g/dL — ABNORMAL LOW (ref 3.8–4.8)
Alpha-1-Globulin: 0.3 g/dL (ref 0.2–0.3)
Alpha-2-Globulin: 0.8 g/dL (ref 0.5–0.9)
BETA 2: 0.5 g/dL (ref 0.2–0.5)
Beta Globulin: 0.5 g/dL (ref 0.4–0.6)
Gamma Globulin: 1 g/dL (ref 0.8–1.7)
IGA: 560 mg/dL — AB (ref 69–380)
IGM, SERUM: 121 mg/dL (ref 52–322)
IgG (Immunoglobin G), Serum: 1020 mg/dL (ref 690–1700)
Total Protein, Serum Electrophoresis: 6.6 g/dL (ref 6.1–8.1)

## 2015-03-04 LAB — HAPTOGLOBIN: HAPTOGLOBIN: 174 mg/dL (ref 43–212)

## 2015-03-04 LAB — FOLATE RBC: RBC FOLATE: 1139 ng/mL (ref >280)

## 2015-03-04 LAB — VITAMIN B12: VITAMIN B 12: 1410 pg/mL — AB (ref 211–911)

## 2015-03-04 LAB — SEDIMENTATION RATE: Sed Rate: 60 mm/hr — ABNORMAL HIGH (ref 0–30)

## 2015-03-09 NOTE — Telephone Encounter (Signed)
Left v/m requesting cb.

## 2015-03-15 NOTE — Telephone Encounter (Signed)
Pt left v/m requesting cb; left v/m requesting cb. 

## 2015-03-16 ENCOUNTER — Other Ambulatory Visit: Payer: Self-pay | Admitting: Family Medicine

## 2015-04-06 ENCOUNTER — Other Ambulatory Visit: Payer: Self-pay | Admitting: Family Medicine

## 2015-04-15 NOTE — Telephone Encounter (Signed)
Spoke with pt and she went to pharmacy and got refills done; nothing further needed.

## 2015-04-25 ENCOUNTER — Telehealth: Payer: Self-pay | Admitting: Hematology

## 2015-04-25 ENCOUNTER — Ambulatory Visit (HOSPITAL_BASED_OUTPATIENT_CLINIC_OR_DEPARTMENT_OTHER): Payer: Commercial Managed Care - HMO | Admitting: Hematology

## 2015-04-25 ENCOUNTER — Other Ambulatory Visit (HOSPITAL_BASED_OUTPATIENT_CLINIC_OR_DEPARTMENT_OTHER): Payer: Commercial Managed Care - HMO

## 2015-04-25 ENCOUNTER — Encounter: Payer: Self-pay | Admitting: Hematology

## 2015-04-25 VITALS — BP 161/67 | HR 66 | Temp 98.0°F | Resp 17 | Ht 61.0 in | Wt 171.2 lb

## 2015-04-25 DIAGNOSIS — D649 Anemia, unspecified: Secondary | ICD-10-CM | POA: Diagnosis not present

## 2015-04-25 DIAGNOSIS — D469 Myelodysplastic syndrome, unspecified: Secondary | ICD-10-CM

## 2015-04-25 DIAGNOSIS — E039 Hypothyroidism, unspecified: Secondary | ICD-10-CM | POA: Diagnosis not present

## 2015-04-25 DIAGNOSIS — D696 Thrombocytopenia, unspecified: Secondary | ICD-10-CM

## 2015-04-25 DIAGNOSIS — R300 Dysuria: Secondary | ICD-10-CM

## 2015-04-25 LAB — CBC & DIFF AND RETIC
BASO%: 0.8 % (ref 0.0–2.0)
BASOS ABS: 0 10*3/uL (ref 0.0–0.1)
EOS%: 2.7 % (ref 0.0–7.0)
Eosinophils Absolute: 0.1 10*3/uL (ref 0.0–0.5)
HEMATOCRIT: 33.4 % — AB (ref 34.8–46.6)
HGB: 11.1 g/dL — ABNORMAL LOW (ref 11.6–15.9)
Immature Retic Fract: 12.8 % — ABNORMAL HIGH (ref 1.60–10.00)
LYMPH#: 1.6 10*3/uL (ref 0.9–3.3)
LYMPH%: 31.2 % (ref 14.0–49.7)
MCH: 32.6 pg (ref 25.1–34.0)
MCHC: 33.2 g/dL (ref 31.5–36.0)
MCV: 98.2 fL (ref 79.5–101.0)
MONO#: 0.5 10*3/uL (ref 0.1–0.9)
MONO%: 9.3 % (ref 0.0–14.0)
NEUT#: 2.9 10*3/uL (ref 1.5–6.5)
NEUT%: 56 % (ref 38.4–76.8)
PLATELETS: 96 10*3/uL — AB (ref 145–400)
RBC: 3.4 10*6/uL — ABNORMAL LOW (ref 3.70–5.45)
RDW: 14.4 % (ref 11.2–14.5)
RETIC CT ABS: 52.02 10*3/uL (ref 33.70–90.70)
Retic %: 1.53 % (ref 0.70–2.10)
WBC: 5.3 10*3/uL (ref 3.9–10.3)

## 2015-04-25 LAB — COMPREHENSIVE METABOLIC PANEL (CC13)
ALBUMIN: 3.3 g/dL — AB (ref 3.5–5.0)
ALK PHOS: 74 U/L (ref 40–150)
ALT: 23 U/L (ref 0–55)
ANION GAP: 10 meq/L (ref 3–11)
AST: 31 U/L (ref 5–34)
BILIRUBIN TOTAL: 0.8 mg/dL (ref 0.20–1.20)
BUN: 27.9 mg/dL — ABNORMAL HIGH (ref 7.0–26.0)
CALCIUM: 9.4 mg/dL (ref 8.4–10.4)
CHLORIDE: 111 meq/L — AB (ref 98–109)
CO2: 23 mEq/L (ref 22–29)
CREATININE: 1 mg/dL (ref 0.6–1.1)
EGFR: 52 mL/min/{1.73_m2} — ABNORMAL LOW (ref >90)
Glucose: 174 mg/dl — ABNORMAL HIGH (ref 70–140)
Potassium: 4 mEq/L (ref 3.5–5.1)
Sodium: 144 mEq/L (ref 136–145)
TOTAL PROTEIN: 6.5 g/dL (ref 6.4–8.3)

## 2015-04-25 LAB — FERRITIN CHCC: FERRITIN: 75 ng/mL (ref 9–269)

## 2015-04-25 NOTE — Telephone Encounter (Signed)
Appointments made and avs printed for patient and she has been sent back to the lab   Stacey Harris

## 2015-04-27 LAB — ERYTHROPOIETIN: ERYTHROPOIETIN: 40.6 m[IU]/mL — AB (ref 2.6–18.5)

## 2015-05-13 ENCOUNTER — Other Ambulatory Visit (INDEPENDENT_AMBULATORY_CARE_PROVIDER_SITE_OTHER): Payer: Commercial Managed Care - HMO

## 2015-05-13 DIAGNOSIS — E119 Type 2 diabetes mellitus without complications: Secondary | ICD-10-CM

## 2015-05-13 DIAGNOSIS — I10 Essential (primary) hypertension: Secondary | ICD-10-CM | POA: Diagnosis not present

## 2015-05-13 LAB — BASIC METABOLIC PANEL
BUN: 33 mg/dL — AB (ref 6–23)
CALCIUM: 10.4 mg/dL (ref 8.4–10.5)
CO2: 27 mEq/L (ref 19–32)
CREATININE: 1.09 mg/dL (ref 0.40–1.20)
Chloride: 107 mEq/L (ref 96–112)
GFR: 50.83 mL/min — AB (ref >60.00)
Glucose, Bld: 176 mg/dL — ABNORMAL HIGH (ref 70–99)
Potassium: 3.9 mEq/L (ref 3.5–5.1)
Sodium: 141 mEq/L (ref 135–145)

## 2015-05-13 LAB — HEMOGLOBIN A1C: HEMOGLOBIN A1C: 8.7 % — AB (ref 4.6–6.5)

## 2015-05-13 NOTE — Progress Notes (Signed)
[name]  HEMATOLOGY ONCOLOGY PROGRESS NOTE  Date of service: .04/25/2015  Patient Care Team: [name], MD as PCP - General  Diagnosis: Chronic anemia and thrombocytopenia likely related to MDS  Current Treatment:  -current monitoring of Blood counts -would consider BM Bx +/- ESA/Iron if worsening anemia  INTERVAL HISTORY: Patient notes that she feels about the same. No increased fatigue. No fevers or chills. No night sweats. No issues with bleeding. Hemoglobin is stable. Mild thrombocytopenia is stable. Notes that she fell and hurt her left knee several weeks ago but is feeling better. No other acute new symptoms.  REVIEW OF SYSTEMS:    10 Point review of systems of done and is negative except as noted above.  . Past Medical History  Diagnosis Date  . Hyperlipidemia   . Hypertension   . Hypothyroidism   . OA (osteoarthritis)   . Cancer ([place])     basal cell skin CA  . Diabetes mellitus     type II  . Blood transfusion     . Past Surgical History  Procedure Laterality Date  . [place]       and BSO  . Tonsillectomy    . Mohs surgery  12/08    basal cell skin cancer lt temple  . Joint replacement      rt total hip 10  . Ankle fracture surgery  10    rt  . Lumbar laminectomy/decompression microdiscectomy  06/22/2011    Procedure: LUMBAR LAMINECTOMY/DECOMPRESSION MICRODISCECTOMY;  Surgeon: Marybelle Killings;  Location: [facility];  Service: Orthopedics;  Laterality: N/A;  L3-4, L4-5 Decompression    . Social History  Substance Use Topics  . Smoking status: Never Smoker   . Smokeless tobacco: None  . Alcohol Use: No    ALLERGIES:  has No Known Allergies.  MEDICATIONS:  Current Outpatient Prescriptions  Medication Sig Dispense Refill  . ACCU-CHEK FASTCLIX LANCETS MISC Check blood sugar once daily and as directed.Dx 250.00 102 each 3  . aspirin 81 MG tablet Take 81 mg by mouth daily.      . Blood Glucose Calibration (ACCU-CHEK SMARTVIEW CONTROL) LIQD     . Blood Glucose  Monitoring Suppl (ACCU-CHEK AVIVA PLUS) W/DEVICE KIT Check blood sugar once daily and as directed.Dx 250.00 1 kit 0  . CRESTOR 10 MG tablet TAKE 1 TABLET EVERY DAY 90 tablet 3  . diazepam (VALIUM) 5 MG tablet Take 0.5 tablets (2.5 mg total) by mouth at bedtime as needed. 30 tablet 0  . glimepiride (AMARYL) 2 MG tablet TAKE 1 TABLET BY MOUTH EVERY MORNING BEFORE BREAKFAST 30 tablet 4  . glucose blood (ACCU-CHEK AVIVA PLUS) test strip Check blood sugar once daily and as directed.Dx 250.00 100 each 3  . hydrochlorothiazide (HYDRODIURIL) 25 MG tablet TAKE 1 TABLET BY MOUTH EVERY MORNING 30 tablet 2  . levothyroxine (SYNTHROID, LEVOTHROID) 50 MCG tablet TAKE 1 TABLET BY MOUTH DAILY 30 tablet 2  . metFORMIN (GLUCOPHAGE) 1000 MG tablet TAKE 1 TABLET BY MOUTH 2 TIMES DAILY WITH A MEAL. 60 tablet 2  . metoprolol succinate (TOPROL-XL) 100 MG 24 hr tablet TAKE 1 TABLET BY MOUTH DAILY 30 tablet 5  . Omega-3 Fatty Acids (FISH OIL) 1200 MG CAPS Take 1,200 mg by mouth 2 (two) times daily.      . trandolapril (MAVIK) 4 MG tablet TAKE 1 TABLET TWICE DAILY 180 tablet 1   No current facility-administered medications for this visit.    PHYSICAL EXAMINATION: ECOG PERFORMANCE STATUS: 1 -  Symptomatic but completely ambulatory  . Filed Vitals:   04/25/15 1006  BP: 161/67  Pulse: 66  Temp: 98 F (36.7 C)  Resp: 17    Filed Weights   04/25/15 1006  Weight: 171 lb 3.2 oz (77.656 kg)   .Body mass index is 32.36 kg/(m^2).  GENERAL:alert, in no acute distress and comfortable SKIN: skin color, texture, turgor are normal, no rashes or significant lesions EYES: normal, conjunctiva are pink and non-injected, sclera clear OROPHARYNX:no exudate, no erythema and lips, buccal mucosa, and tongue normal  NECK: supple, no JVD, thyroid normal size, non-tender, without nodularity LYMPH: no palpable lymphadenopathy in the cervical, axillary or inguinal LUNGS: clear to auscultation with normal respiratory  effort   HEART: regular rate & rhythm, no murmurs and no lower extremity edema ABDOMEN: abdomen soft, non-tender, normoactive bowel sounds  Musculoskeletal: no cyanosis of digits and no clubbing  PSYCH: alert & oriented x 3 with fluent speech NEURO: no focal motor/sensory deficits  LABORATORY DATA:   I have reviewed the data as listed  . CBC Latest Ref Rng 04/25/2015 02/28/2015 02/03/2015  WBC 3.9 - 10.3 10e3/uL 5.3 5.8 5.7  Hemoglobin 11.6 - 15.9 g/dL 11.1(L) 10.8(L) 10.4(L)  Hematocrit 34.8 - 46.6 % 33.4(L) 32.5(L) 31.1(L)  Platelets 145 - 400 10e3/uL 96(L) 115 Large platelets present(L) 96.0(L)    . CMP Latest Ref Rng 04/25/2015 02/03/2015  Glucose 70 - 99 mg/dL 174(H) 157(H)  BUN 6 - 23 mg/dL 27.9(H) 24(H)  Creatinine 0.40 - 1.20 mg/dL 1.0 0.94  Sodium 135 - 145 mEq/L 144 141  Potassium 3.5 - 5.1 mEq/L 4.0 4.2  Chloride 96 - 112 mEq/L - 109  CO2 19 - 32 mEq/L 23 25  Calcium 8.4 - 10.5 mg/dL 9.4 9.1  Total Protein 6.4 - 8.3 g/dL 6.5 6.3  Total Bilirubin 0.20 - 1.20 mg/dL 0.80 0.7  Alkaline Phos 40 - 150 U/L 74 70  AST 5 - 34 U/L 31 30  ALT 0 - 55 U/L 23 20   Component     Latest Ref Rng 04/25/2015  Ferritin     9 - 269 ng/ml 75  Erythropoietin     2.6 - 18.5 mIU/mL 40.6 (H)   RADIOGRAPHIC STUDIES: I have personally reviewed the radiological images as listed and agreed with the findings in the report. No results found.  ASSESSMENT & PLAN:   Mrs. Tryon is a very sweet 81 year old female with  1) Chronic anemia. Normocytic but with high normal MCV levels. No evidence of hemolysis on peripheral blood smear or labs. Peripheral blood smear shows macrocytic RBC population. Normal B12. No history of alcohol. Hypothyroidism is well treated. Presence of large platelets and dysplastic WBCs in addition to macrocytic RBCs is consistent with the possibility of myelodysplastic syndrome. 2) mild Thrombocytopenia -the counts are being underestimated at least partly due to  platelet clumping on the smear as noted above. It is also partly due to her presentation of myelodysplastic syndrome. Plan -Patient's anemia and some cytopenia is stable at this time. -No indication for blood transfusion or platelet transfusions at this time. [facility] consider doing bone marrow biopsy if her blood counts start to worsen to check for specifics of her myelodysplastic syndrome especially cytogenetics. -Patient's EPO level is 40. This would mean that she might respond well to ESA if needed. Ferritin is  currently acceptable at  75.  we would replace iron to a ferritin of more than 100 if ESA treatment becomes necessary.  -Return to care with  Dr. Irene Limbo in 3 months with repeat blood counts CBC, CMP. Earlier if any other acute concerns .  I spent 15 minutes counseling the patient face to face. The total time spent in the appointment was 20 minutes and more than 50% was on counseling and direct patient cares.    Sullivan Lone MD [place] AAHIVMS [facility] Hematology/Oncology Physician [facility]  (Office):       [phone] (Work cell):  [phone] (Fax):           [phone]

## 2015-05-20 ENCOUNTER — Ambulatory Visit (INDEPENDENT_AMBULATORY_CARE_PROVIDER_SITE_OTHER): Payer: Commercial Managed Care - HMO | Admitting: Family Medicine

## 2015-05-20 ENCOUNTER — Encounter: Payer: Self-pay | Admitting: Family Medicine

## 2015-05-20 VITALS — BP 122/72 | HR 57 | Temp 98.0°F | Ht 61.0 in | Wt 166.5 lb

## 2015-05-20 DIAGNOSIS — E669 Obesity, unspecified: Secondary | ICD-10-CM

## 2015-05-20 DIAGNOSIS — I10 Essential (primary) hypertension: Secondary | ICD-10-CM

## 2015-05-20 DIAGNOSIS — E1140 Type 2 diabetes mellitus with diabetic neuropathy, unspecified: Secondary | ICD-10-CM | POA: Diagnosis not present

## 2015-05-20 DIAGNOSIS — E1165 Type 2 diabetes mellitus with hyperglycemia: Secondary | ICD-10-CM

## 2015-05-20 DIAGNOSIS — IMO0002 Reserved for concepts with insufficient information to code with codable children: Secondary | ICD-10-CM

## 2015-05-20 MED ORDER — GLIMEPIRIDE 4 MG PO TABS: 4.0000 mg | ORAL_TABLET | Freq: Every day | ORAL | Status: DC

## 2015-05-20 NOTE — Progress Notes (Signed)
Subjective:    Patient ID: Stacey Harris, female    DOB: 1928-08-11, 81 y.o.   MRN: [id number]  HPI Here for f/u for chronic medical problems   Had a high dose flu vaccine - and it made her feel bad  Better now   Wt is down 5 lb  bmi of 46   Was interested in the Tdap   Was dx with myelodysplastic syndrome   Diabetes Home sugar results - her results are labile- is sometimes over 200- usually 140-200  DM diet -eating less in general , thinks she has eaten the right things- has not been to DM teaching (she declines it) Exercise -walks when she can (has some pain issues that stop her- hip and leg)  Symptoms- none  A1C last  Lab Results  Component Value Date   HGBA1C 8.7* 05/13/2015    No problems with medications  Renal protection- ace  Last eye exam 1/16  Has R hip pain - bursitis - has had cortisone shot with Dr Lorin Mercy     Patient Active Problem List   Diagnosis Date Noted  . Normocytic anemia 02/11/2015  . GERD (gastroesophageal reflux disease) 08/06/2014  . Encounter for Medicare annual wellness exam 08/03/2013  . Colon cancer screening 08/03/2013  . Thrombocytopenia ([place]) 08/03/2013  . Insomnia 01/26/2013  . Bee sting reaction 01/26/2013  . Hypothyroid 09/17/2011  . Lumbar spinal stenosis 06/21/2011    Class: Present on Admission  . Anemia, mild 05/25/2011  . BACK PAIN, LUMBAR 08/23/2010  . POSTNASAL DRIP 08/23/2010  . OSTEOARTHRITIS, HIP 03/16/2009  . MENOPAUSAL SYNDROME 12/08/2008  . CORNS AND CALLOSITIES 08/13/2007  . BASAL CELL CARCINOMA, FACE 07/01/2007  . BASAL CELL CARCINOMA, FACE 07/01/2007  . Type 2 diabetes, uncontrolled, with neuropathy ([place]) 09/24/2006  . DIABETIC PERIPHERAL NEUROPATHY 09/24/2006  . Hyperlipidemia 09/24/2006  . Obesity 09/24/2006  . Essential hypertension 09/24/2006  . ROSACEA 09/24/2006  . INCONTINENCE, URGE 09/24/2006  . COLONOSCOPY AND REMOVAL OF LESION, HX OF 06/09/2003   Past Medical History  Diagnosis Date  .  Hyperlipidemia   . Hypertension   . Hypothyroidism   . OA (osteoarthritis)   . Cancer ([place])     basal cell skin CA  . Diabetes mellitus     type II  . Blood transfusion    Past Surgical History  Procedure Laterality Date  . [place]       and BSO  . Tonsillectomy    . Mohs surgery  12/08    basal cell skin cancer lt temple  . Joint replacement      rt total hip 10  . Ankle fracture surgery  10    rt  . Lumbar laminectomy/decompression microdiscectomy  06/22/2011    Procedure: LUMBAR LAMINECTOMY/DECOMPRESSION MICRODISCECTOMY;  Surgeon: Marybelle Killings;  Location: [facility];  Service: Orthopedics;  Laterality: N/A;  L3-4, L4-5 Decompression   Social History  Substance Use Topics  . Smoking status: Never Smoker   . Smokeless tobacco: None  . Alcohol Use: No   Family History  Problem Relation Age of Onset  . Arthritis Father     RA  . Hypertension Father   . Heart disease Father     CAD  . Diabetes Father   . Arthritis Sister     RA  . Diabetes Brother   . Diabetes Brother   . Diabetes Brother    No Known Allergies Current Outpatient Prescriptions on File Prior to Visit  Medication Sig Dispense  Refill  . ACCU-CHEK FASTCLIX LANCETS MISC Check blood sugar once daily and as directed.Dx 250.00 102 each 3  . aspirin 81 MG tablet Take 81 mg by mouth daily.      . Blood Glucose Calibration (ACCU-CHEK SMARTVIEW CONTROL) LIQD     . Blood Glucose Monitoring Suppl (ACCU-CHEK AVIVA PLUS) W/DEVICE KIT Check blood sugar once daily and as directed.Dx 250.00 1 kit 0  . CRESTOR 10 MG tablet TAKE 1 TABLET EVERY DAY 90 tablet 3  . diazepam (VALIUM) 5 MG tablet Take 0.5 tablets (2.5 mg total) by mouth at bedtime as needed. 30 tablet 0  . glimepiride (AMARYL) 2 MG tablet TAKE 1 TABLET BY MOUTH EVERY MORNING BEFORE BREAKFAST 30 tablet 4  . glucose blood (ACCU-CHEK AVIVA PLUS) test strip Check blood sugar once daily and as directed.Dx 250.00 100 each 3  . hydrochlorothiazide (HYDRODIURIL) 25 MG  tablet TAKE 1 TABLET BY MOUTH EVERY MORNING 30 tablet 2  . levothyroxine (SYNTHROID, LEVOTHROID) 50 MCG tablet TAKE 1 TABLET BY MOUTH DAILY 30 tablet 2  . metFORMIN (GLUCOPHAGE) 1000 MG tablet TAKE 1 TABLET BY MOUTH 2 TIMES DAILY WITH A MEAL. 60 tablet 2  . metoprolol succinate (TOPROL-XL) 100 MG 24 hr tablet TAKE 1 TABLET BY MOUTH DAILY 30 tablet 5  . Omega-3 Fatty Acids (FISH OIL) 1200 MG CAPS Take 1,200 mg by mouth 2 (two) times daily.      . trandolapril (MAVIK) 4 MG tablet TAKE 1 TABLET TWICE DAILY 180 tablet 1   No current facility-administered medications on file prior to visit.    Review of Systems    Review of Systems  Constitutional: Negative for fever, appetite change, fatigue and unexpected weight change.  Eyes: Negative for pain and visual disturbance.  Respiratory: Negative for cough and shortness of breath.   Cardiovascular: Negative for cp or palpitations    Gastrointestinal: Negative for nausea, diarrhea and constipation.  Genitourinary: Negative for urgency and frequency.  Skin: Negative for pallor or rash   Neurological: Negative for weakness, light-headedness, numbness and headaches.  Hematological: Negative for adenopathy. Does not bruise/bleed easily.  Psychiatric/Behavioral: Negative for dysphoric mood. The patient is not nervous/anxious.      Objective:   Physical Exam  Constitutional: She appears well-developed and well-nourished. No distress.  obese and well appearing   HENT:  Head: Normocephalic and atraumatic.  Mouth/Throat: Oropharynx is clear and moist.  Eyes: Conjunctivae and EOM are normal. Pupils are equal, round, and reactive to light.  Neck: Normal range of motion. Neck supple. No JVD present. Carotid bruit is not present. No thyromegaly present.  Cardiovascular: Normal rate, regular rhythm, normal heart sounds and intact distal pulses.  Exam reveals no gallop.   Pulmonary/Chest: Effort normal and breath sounds normal. No respiratory distress. She  has no wheezes. She has no rales.  No crackles  Abdominal: Soft. Bowel sounds are normal. She exhibits no distension, no abdominal bruit and no mass. There is no tenderness.  Musculoskeletal: She exhibits no edema.  Poor rom spine and hips   Lymphadenopathy:    She has no cervical adenopathy.  Neurological: She is alert. She has normal reflexes.  Skin: Skin is warm and dry. No rash noted.  Psychiatric: She has a normal mood and affect.          Assessment & Plan:   Problem List Items Addressed This Visit      Cardiovascular and Mediastinum   Essential hypertension - Primary    bp in fair  control at this time  BP Readings from Last 1 Encounters:  05/20/15 122/72   No changes needed Disc lifstyle change with low sodium diet and exercise  Labs reviewed  Enc wt loss         Endocrine   Type 2 diabetes, uncontrolled, with neuropathy ([facility])    Lab Results  Component Value Date   HGBA1C 8.7* 05/13/2015   This is not in good control  Rev diet/exercise -pt now thinks she is more motivated  Made plan for exercise- disc poss of chair exercise or walking  Inc amaryl to 4 mg watching closely for low glucose readings  Enc wt loss  Pt declines DM education - though it was strongly encouraged  Plan f/u 3 mo with lab prior       Relevant Medications   glimepiride (AMARYL) 4 MG tablet     Other   Obesity    Discussed how this problem influences overall health and the risks it imposes  Reviewed plan for weight loss with lower calorie diet (via better food choices and also portion control or program like weight watchers) and exercise building up to or more than 30 minutes 5 days per week including some aerobic activity   Unsure if pt is motivated       Relevant Medications   glimepiride (AMARYL) 4 MG tablet      

## 2015-05-20 NOTE — Patient Instructions (Addendum)
Since you are potentially around a baby- look into getting a Tdap vaccine -see the paper I gave you  For diabetes I feel very strongly that you need diabetic education class- when you are ready - call us and let us know so we can set that up  Get back to Dr Lorin Mercy for your pain issues so we can get you able to do more exercise Work on weight loss Increase amaryl to 4 mg daily  Watch out for low blood sugars (under 70) Stop at check out - to talk to my office manager and talk about the phone issues you are happening and also get your cell phone number in the future   Follow up in 3 mo with labs prior

## 2015-05-20 NOTE — Progress Notes (Signed)
Pre visit review using our clinic review tool, if applicable. No additional management support is needed unless otherwise documented below in the visit note. 

## 2015-05-21 NOTE — Assessment & Plan Note (Signed)
bp in fair control at this time  BP Readings from Last 1 Encounters:  05/20/15 122/72   No changes needed Disc lifstyle change with low sodium diet and exercise  Labs reviewed  Enc wt loss

## 2015-05-21 NOTE — Assessment & Plan Note (Signed)
Lab Results  Component Value Date   HGBA1C 8.7* 05/13/2015   This is not in good control  Rev diet/exercise -pt now thinks she is more motivated  Made plan for exercise- disc poss of chair exercise or walking  Inc amaryl to 4 mg watching closely for low glucose readings  Enc wt loss  Pt declines DM education - though it was strongly encouraged  Plan f/u 3 mo with lab prior

## 2015-05-21 NOTE — Assessment & Plan Note (Addendum)
Discussed how this problem influences overall health and the risks it imposes  Reviewed plan for weight loss with lower calorie diet (via better food choices and also portion control or program like weight watchers) and exercise building up to or more than 30 minutes 5 days per week including some aerobic activity   Unsure if pt is motivated  May be able to exercise once she gets her orthopedic problems resolved -will f/u with Dr Lorin Mercy for that

## 2015-06-08 ENCOUNTER — Other Ambulatory Visit: Payer: Self-pay | Admitting: Family Medicine

## 2015-06-27 ENCOUNTER — Other Ambulatory Visit: Payer: Self-pay | Admitting: Family Medicine

## 2015-07-04 ENCOUNTER — Other Ambulatory Visit: Payer: Self-pay | Admitting: Family Medicine

## 2015-07-19 LAB — HM DIABETES EYE EXAM

## 2015-07-26 ENCOUNTER — Ambulatory Visit (HOSPITAL_BASED_OUTPATIENT_CLINIC_OR_DEPARTMENT_OTHER): Payer: Commercial Managed Care - HMO | Admitting: Hematology

## 2015-07-26 ENCOUNTER — Encounter: Payer: Self-pay | Admitting: Hematology

## 2015-07-26 ENCOUNTER — Telehealth: Payer: Self-pay | Admitting: Hematology

## 2015-07-26 ENCOUNTER — Other Ambulatory Visit (HOSPITAL_BASED_OUTPATIENT_CLINIC_OR_DEPARTMENT_OTHER): Payer: Commercial Managed Care - HMO

## 2015-07-26 VITALS — BP 162/83 | HR 66 | Temp 98.0°F | Resp 17 | Ht 61.0 in | Wt 168.2 lb

## 2015-07-26 DIAGNOSIS — D649 Anemia, unspecified: Secondary | ICD-10-CM

## 2015-07-26 DIAGNOSIS — D696 Thrombocytopenia, unspecified: Secondary | ICD-10-CM

## 2015-07-26 DIAGNOSIS — E039 Hypothyroidism, unspecified: Secondary | ICD-10-CM | POA: Diagnosis not present

## 2015-07-26 DIAGNOSIS — D6489 Other specified anemias: Secondary | ICD-10-CM

## 2015-07-26 LAB — COMPREHENSIVE METABOLIC PANEL
ALBUMIN: 3.3 g/dL — AB (ref 3.5–5.0)
ALK PHOS: 75 U/L (ref 40–150)
ALT: 30 U/L (ref 0–55)
AST: 35 U/L — AB (ref 5–34)
Anion Gap: 9 mEq/L (ref 3–11)
BUN: 26.9 mg/dL — AB (ref 7.0–26.0)
CALCIUM: 10.6 mg/dL — AB (ref 8.4–10.4)
CHLORIDE: 107 meq/L (ref 98–109)
CO2: 26 mEq/L (ref 22–29)
CREATININE: 1.1 mg/dL (ref 0.6–1.1)
EGFR: 49 mL/min/{1.73_m2} — ABNORMAL LOW (ref >90)
GLUCOSE: 233 mg/dL — AB (ref 70–140)
POTASSIUM: 4.3 meq/L (ref 3.5–5.1)
SODIUM: 142 meq/L (ref 136–145)
Total Bilirubin: 0.78 mg/dL (ref 0.20–1.20)
Total Protein: 6.8 g/dL (ref 6.4–8.3)

## 2015-07-26 LAB — CBC & DIFF AND RETIC
BASO%: 1.2 % (ref 0.0–2.0)
BASOS ABS: 0.1 10*3/uL (ref 0.0–0.1)
EOS ABS: 0.1 10*3/uL (ref 0.0–0.5)
EOS%: 2.2 % (ref 0.0–7.0)
HEMATOCRIT: 33.4 % — AB (ref 34.8–46.6)
HGB: 11.1 g/dL — ABNORMAL LOW (ref 11.6–15.9)
IMMATURE RETIC FRACT: 9.3 % (ref 1.60–10.00)
LYMPH#: 1.8 10*3/uL (ref 0.9–3.3)
LYMPH%: 36.3 % (ref 14.0–49.7)
MCH: 32.5 pg (ref 25.1–34.0)
MCHC: 33.2 g/dL (ref 31.5–36.0)
MCV: 97.7 fL (ref 79.5–101.0)
MONO#: 0.5 10*3/uL (ref 0.1–0.9)
MONO%: 9.2 % (ref 0.0–14.0)
NEUT#: 2.6 10*3/uL (ref 1.5–6.5)
NEUT%: 51.1 % (ref 38.4–76.8)
PLATELETS: 109 10*3/uL — AB (ref 145–400)
RBC: 3.42 10*6/uL — ABNORMAL LOW (ref 3.70–5.45)
RDW: 15.1 % — ABNORMAL HIGH (ref 11.2–14.5)
RETIC %: 1.84 % (ref 0.70–2.10)
RETIC CT ABS: 62.93 10*3/uL (ref 33.70–90.70)
WBC: 5 10*3/uL (ref 3.9–10.3)
nRBC: 0 % (ref 0–0)

## 2015-07-26 LAB — CHCC SMEAR

## 2015-07-26 NOTE — Telephone Encounter (Signed)
Talked to patient here in office. Scheduled appt.       [facility]. °

## 2015-07-28 DIAGNOSIS — H2513 Age-related nuclear cataract, bilateral: Secondary | ICD-10-CM | POA: Diagnosis not present

## 2015-07-28 DIAGNOSIS — E119 Type 2 diabetes mellitus without complications: Secondary | ICD-10-CM | POA: Diagnosis not present

## 2015-07-28 DIAGNOSIS — H25013 Cortical age-related cataract, bilateral: Secondary | ICD-10-CM | POA: Diagnosis not present

## 2015-08-12 ENCOUNTER — Other Ambulatory Visit (INDEPENDENT_AMBULATORY_CARE_PROVIDER_SITE_OTHER): Payer: Commercial Managed Care - HMO

## 2015-08-12 DIAGNOSIS — I10 Essential (primary) hypertension: Secondary | ICD-10-CM

## 2015-08-12 DIAGNOSIS — E1140 Type 2 diabetes mellitus with diabetic neuropathy, unspecified: Secondary | ICD-10-CM

## 2015-08-12 DIAGNOSIS — IMO0002 Reserved for concepts with insufficient information to code with codable children: Secondary | ICD-10-CM

## 2015-08-12 DIAGNOSIS — E1165 Type 2 diabetes mellitus with hyperglycemia: Secondary | ICD-10-CM

## 2015-08-12 LAB — COMPREHENSIVE METABOLIC PANEL
ALBUMIN: 3.5 g/dL (ref 3.5–5.2)
ALK PHOS: 53 U/L (ref 39–117)
ALT: 23 U/L (ref 0–35)
AST: 31 U/L (ref 0–37)
BUN: 29 mg/dL — AB (ref 6–23)
CALCIUM: 9.7 mg/dL (ref 8.4–10.5)
CO2: 25 mEq/L (ref 19–32)
CREATININE: 0.94 mg/dL (ref 0.40–1.20)
Chloride: 109 mEq/L (ref 96–112)
GFR: 60.26 mL/min (ref >60.00)
GLUCOSE: 163 mg/dL — AB (ref 70–99)
Potassium: 4.1 mEq/L (ref 3.5–5.1)
Sodium: 143 mEq/L (ref 135–145)
Total Bilirubin: 0.7 mg/dL (ref 0.2–1.2)
Total Protein: 6.6 g/dL (ref 6.0–8.3)

## 2015-08-12 LAB — HEMOGLOBIN A1C: HEMOGLOBIN A1C: 8.5 % — AB (ref 4.6–6.5)

## 2015-08-12 LAB — LIPID PANEL
CHOL/HDL RATIO: 3
Cholesterol: 99 mg/dL (ref 0–200)
HDL: 31.8 mg/dL — AB (ref >39.00)
LDL Cholesterol: 47 mg/dL (ref 0–99)
NONHDL: 67.1
Triglycerides: 101 mg/dL (ref 0.0–149.0)
VLDL: 20.2 mg/dL (ref 0.0–40.0)

## 2015-08-15 ENCOUNTER — Other Ambulatory Visit: Payer: Self-pay | Admitting: Family Medicine

## 2015-08-19 ENCOUNTER — Ambulatory Visit (INDEPENDENT_AMBULATORY_CARE_PROVIDER_SITE_OTHER): Payer: Commercial Managed Care - HMO | Admitting: Family Medicine

## 2015-08-19 ENCOUNTER — Encounter: Payer: Self-pay | Admitting: Family Medicine

## 2015-08-19 VITALS — BP 132/80 | HR 58 | Temp 98.2°F | Wt 166.0 lb

## 2015-08-19 DIAGNOSIS — E1140 Type 2 diabetes mellitus with diabetic neuropathy, unspecified: Secondary | ICD-10-CM | POA: Diagnosis not present

## 2015-08-19 DIAGNOSIS — IMO0002 Reserved for concepts with insufficient information to code with codable children: Secondary | ICD-10-CM

## 2015-08-19 DIAGNOSIS — E785 Hyperlipidemia, unspecified: Secondary | ICD-10-CM

## 2015-08-19 DIAGNOSIS — E1165 Type 2 diabetes mellitus with hyperglycemia: Secondary | ICD-10-CM

## 2015-08-19 DIAGNOSIS — E669 Obesity, unspecified: Secondary | ICD-10-CM | POA: Diagnosis not present

## 2015-08-19 DIAGNOSIS — I10 Essential (primary) hypertension: Secondary | ICD-10-CM

## 2015-08-19 MED ORDER — METFORMIN HCL 1000 MG PO TABS: ORAL_TABLET | ORAL | Status: DC

## 2015-08-19 MED ORDER — LEVOTHYROXINE SODIUM 50 MCG PO TABS: 50.0000 ug | ORAL_TABLET | Freq: Every day | ORAL | Status: DC

## 2015-08-19 NOTE — Progress Notes (Signed)
Subjective:    Patient ID: Stacey Harris, female    DOB: 1929-03-01, 82 y.o.   MRN: [id number]  HPI Here for f/u of chronic health problems   Wt is down 2 lb with bmi of 31 Has not been doing much differently  Was bad at the holidays   Has been feeling ok overall   bp is stable today  No cp or palpitations or headaches or edema  No side effects to medicines  BP Readings from Last 3 Encounters:  08/19/15 142/78  07/26/15 162/83  05/20/15 122/72     Cholesterol Lab Results  Component Value Date   CHOL 99 08/12/2015   CHOL 102 02/03/2015   CHOL 116 08/06/2014   Lab Results  Component Value Date   HDL 31.80* 08/12/2015   HDL 28.80* 02/03/2015   HDL 33.30* 08/06/2014   Lab Results  Component Value Date   LDLCALC 47 08/12/2015   LDLCALC 55 02/03/2015   LDLCALC 62 08/06/2014   Lab Results  Component Value Date   TRIG 101.0 08/12/2015   TRIG 92.0 02/03/2015   TRIG 105.0 08/06/2014   Lab Results  Component Value Date   CHOLHDL 3 08/12/2015   CHOLHDL 4 02/03/2015   CHOLHDL 3 08/06/2014   Lab Results  Component Value Date   LDLDIRECT 202.7 08/17/2010  HDL is always low Tries to stay active - walks with grocery cart Chair exercise  Not much tolerance to exercise     Diabetes Home sugar results - no low sugar readings  Usually 150s- 160s  DM diet - she has materials to look at (declines dm teaching)  She has stuck with a diabetic diet (per materials) since the holidays Has not wanted much to eat lately  Exercise -not much she can tolerate - she refuses water exercise  Symptoms-none  A1C last  Lab Results  Component Value Date   HGBA1C 8.5* 08/12/2015  was 8.7   No problems with medications - last time we inc amaryl to 4 mg , on metformin  Renal protection - on ARB Last eye exam - was a month ago- no retinopathy  Has a cataract    Patient Active Problem List   Diagnosis Date Noted  . Normocytic anemia 02/11/2015  . GERD (gastroesophageal  reflux disease) 08/06/2014  . Encounter for Medicare annual wellness exam 08/03/2013  . Colon cancer screening 08/03/2013  . Thrombocytopenia ([place]) 08/03/2013  . Insomnia 01/26/2013  . Bee sting reaction 01/26/2013  . Hypothyroid 09/17/2011  . Lumbar spinal stenosis 06/21/2011    Class: Present on Admission  . Anemia, mild 05/25/2011  . BACK PAIN, LUMBAR 08/23/2010  . POSTNASAL DRIP 08/23/2010  . OSTEOARTHRITIS, HIP 03/16/2009  . MENOPAUSAL SYNDROME 12/08/2008  . CORNS AND CALLOSITIES 08/13/2007  . BASAL CELL CARCINOMA, FACE 07/01/2007  . BASAL CELL CARCINOMA, FACE 07/01/2007  . Type 2 diabetes, uncontrolled, with neuropathy ([place]) 09/24/2006  . DIABETIC PERIPHERAL NEUROPATHY 09/24/2006  . Hyperlipidemia 09/24/2006  . Obesity 09/24/2006  . Essential hypertension 09/24/2006  . ROSACEA 09/24/2006  . INCONTINENCE, URGE 09/24/2006  . COLONOSCOPY AND REMOVAL OF LESION, HX OF 06/09/2003   Past Medical History  Diagnosis Date  . Hyperlipidemia   . Hypertension   . Hypothyroidism   . OA (osteoarthritis)   . Cancer ([place])     basal cell skin CA  . Diabetes mellitus     type II  . Blood transfusion    Past Surgical History  Procedure Laterality Date  .  Tah       and BSO  . Tonsillectomy    . Mohs surgery  12/08    basal cell skin cancer lt temple  . Joint replacement      rt total hip 10  . Ankle fracture surgery  10    rt  . Lumbar laminectomy/decompression microdiscectomy  06/22/2011    Procedure: LUMBAR LAMINECTOMY/DECOMPRESSION MICRODISCECTOMY;  Surgeon: Marybelle Killings;  Location: [facility];  Service: Orthopedics;  Laterality: N/A;  L3-4, L4-5 Decompression   Social History  Substance Use Topics  . Smoking status: Never Smoker   . Smokeless tobacco: None  . Alcohol Use: No   Family History  Problem Relation Age of Onset  . Arthritis Father     RA  . Hypertension Father   . Heart disease Father     CAD  . Diabetes Father   . Arthritis Sister     RA  . Diabetes  Brother   . Diabetes Brother   . Diabetes Brother    No Known Allergies Current Outpatient Prescriptions on File Prior to Visit  Medication Sig Dispense Refill  . ACCU-CHEK FASTCLIX LANCETS MISC Check blood sugar once daily and as directed.Dx 250.00 102 each 3  . aspirin 81 MG tablet Take 81 mg by mouth daily.      . Blood Glucose Calibration (ACCU-CHEK SMARTVIEW CONTROL) LIQD     . Blood Glucose Monitoring Suppl (ACCU-CHEK AVIVA PLUS) W/DEVICE KIT Check blood sugar once daily and as directed.Dx 250.00 1 kit 0  . CRESTOR 10 MG tablet TAKE 1 TABLET EVERY DAY 90 tablet 3  . diazepam (VALIUM) 5 MG tablet Take 0.5 tablets (2.5 mg total) by mouth at bedtime as needed. 30 tablet 0  . glimepiride (AMARYL) 4 MG tablet Take 1 tablet (4 mg total) by mouth daily before breakfast. 30 tablet 11  . glucose blood (ACCU-CHEK AVIVA PLUS) test strip Check blood sugar once daily and as directed.Dx 250.00 100 each 3  . hydrochlorothiazide (HYDRODIURIL) 25 MG tablet TAKE 1 TABLET BY MOUTH EVERY MORNING 30 tablet 2  . metoprolol succinate (TOPROL-XL) 100 MG 24 hr tablet TAKE 1 TABLET BY MOUTH DAILY 30 tablet 1  . Multiple Vitamin (MULTIVITAMIN) capsule Take 1 capsule by mouth daily.    . Omega-3 Fatty Acids (FISH OIL) 1200 MG CAPS Take 1,200 mg by mouth 2 (two) times daily.      . trandolapril (MAVIK) 4 MG tablet TAKE 1 TABLET TWICE DAILY 180 tablet 1   No current facility-administered medications on file prior to visit.     Review of Systems Review of Systems  Constitutional: Negative for fever, appetite change, fatigue and unexpected weight change.  Eyes: Negative for pain and visual disturbance.  Respiratory: Negative for cough and shortness of breath.   Cardiovascular: Negative for cp or palpitations    Gastrointestinal: Negative for nausea, diarrhea and constipation.  Genitourinary: Negative for urgency and frequency.  Skin: Negative for pallor or rash   Neurological: Negative for weakness,  light-headedness, numbness and headaches.  Hematological: Negative for adenopathy. Does not bruise/bleed easily.  Psychiatric/Behavioral: Negative for dysphoric mood. The patient is not nervous/anxious.         Objective:   Physical Exam  Constitutional: She appears well-developed and well-nourished. No distress.  obese and well appearing   HENT:  Head: Normocephalic and atraumatic.  Mouth/Throat: Oropharynx is clear and moist.  Eyes: Conjunctivae and EOM are normal. Pupils are equal, round, and reactive to light.  Neck:  Normal range of motion. Neck supple. No JVD present. Carotid bruit is not present. No thyromegaly present.  Cardiovascular: Normal rate, regular rhythm, normal heart sounds and intact distal pulses.  Exam reveals no gallop.   Pulmonary/Chest: Effort normal and breath sounds normal. No respiratory distress. She has no wheezes. She has no rales.  No crackles  Abdominal: Soft. Bowel sounds are normal. She exhibits no distension, no abdominal bruit and no mass. There is no tenderness.  Musculoskeletal: She exhibits no edema.  Lymphadenopathy:    She has no cervical adenopathy.  Neurological: She is alert. She has normal reflexes.  Skin: Skin is warm and dry. No rash noted.  Psychiatric: She has a normal mood and affect.          Assessment & Plan:   Problem List Items Addressed This Visit      Cardiovascular and Mediastinum   Essential hypertension - Primary    On Mavik and hctz - but has not yet taken them today Not optimal control today bp in fair control at this time  BP Readings from Last 1 Encounters:  08/19/15 132/80   No changes needed Disc lifstyle change with low sodium diet and exercise   Fu upcoming for re check - will check after she has taken her medicines        Endocrine   Type 2 diabetes, uncontrolled, with neuropathy ([place])    No controlled Lab Results  Component Value Date   HGBA1C 8.5* 08/12/2015   Will begin logging glucose am an  d2 h pp and some bedtimes Keep track of diet Urged dm diet and exercise as tolerated F/u and bring list of covered DM med- will likely add therapy      Relevant Medications   metFORMIN (GLUCOPHAGE) 1000 MG tablet     Other   Hyperlipidemia    Fairly good control with crestor and diet Disc goals for lipids and reasons to control them Rev labs with pt Rev low sat fat diet in detail      Obesity    Discussed how this problem influences overall health and the risks it imposes  Reviewed plan for weight loss with lower calorie diet (via better food choices and also portion control or program like weight watchers) and exercise building up to or more than 30 minutes 5 days per week including some aerobic activity         Relevant Medications   metFORMIN (GLUCOPHAGE) 1000 MG tablet

## 2015-08-19 NOTE — Patient Instructions (Signed)
Your diabetes is out of control  Blood pressure is borderline Please get any exercise you can tolerate  Also stick with a diabetic diet Continue current medicines  Check your blood glucose twice daily --- once in the am before you eat, and once 2 hours after a meal (lunch or dinner - go back and forth) Once in a while - check some at bedtime Please write it all down and follow up with me in 2-3 weeks with the readings  Also call your insurance and get a list of all covered diabetic medications - and their copays  Bring it with it   We will make a plan from there

## 2015-08-19 NOTE — Progress Notes (Signed)
Pre visit review using our clinic review tool, if applicable. No additional management support is needed unless otherwise documented below in the visit note. 

## 2015-08-21 NOTE — Assessment & Plan Note (Signed)
No controlled Lab Results  Component Value Date   HGBA1C 8.5* 08/12/2015   Will begin logging glucose am an d2 h pp and some bedtimes Keep track of diet Urged dm diet and exercise as tolerated F/u and bring list of covered DM med- will likely add therapy

## 2015-08-21 NOTE — Assessment & Plan Note (Signed)
Fairly good control with crestor and diet Disc goals for lipids and reasons to control them Rev labs with pt Rev low sat fat diet in detail

## 2015-08-21 NOTE — Assessment & Plan Note (Signed)
Discussed how this problem influences overall health and the risks it imposes  Reviewed plan for weight loss with lower calorie diet (via better food choices and also portion control or program like weight watchers) and exercise building up to or more than 30 minutes 5 days per week including some aerobic activity    

## 2015-08-21 NOTE — Assessment & Plan Note (Signed)
On Mavik and hctz - but has not yet taken them today Not optimal control today bp in fair control at this time  BP Readings from Last 1 Encounters:  08/19/15 132/80   No changes needed Disc lifstyle change with low sodium diet and exercise   Fu upcoming for re check - will check after she has taken her medicines

## 2015-08-26 ENCOUNTER — Other Ambulatory Visit: Payer: Self-pay | Admitting: Family Medicine

## 2015-09-09 ENCOUNTER — Encounter: Payer: Self-pay | Admitting: Family Medicine

## 2015-09-09 ENCOUNTER — Ambulatory Visit (INDEPENDENT_AMBULATORY_CARE_PROVIDER_SITE_OTHER): Payer: Commercial Managed Care - HMO | Admitting: Family Medicine

## 2015-09-09 VITALS — BP 120/70 | HR 67 | Temp 97.5°F | Ht 61.0 in | Wt 157.5 lb

## 2015-09-09 DIAGNOSIS — I10 Essential (primary) hypertension: Secondary | ICD-10-CM | POA: Diagnosis not present

## 2015-09-09 DIAGNOSIS — E1140 Type 2 diabetes mellitus with diabetic neuropathy, unspecified: Secondary | ICD-10-CM

## 2015-09-09 DIAGNOSIS — E1165 Type 2 diabetes mellitus with hyperglycemia: Secondary | ICD-10-CM | POA: Diagnosis not present

## 2015-09-09 DIAGNOSIS — IMO0002 Reserved for concepts with insufficient information to code with codable children: Secondary | ICD-10-CM

## 2015-09-09 MED ORDER — GLIMEPIRIDE 4 MG PO TABS: 4.0000 mg | ORAL_TABLET | Freq: Every day | ORAL | Status: DC

## 2015-09-09 NOTE — Progress Notes (Signed)
 Subjective:    Patient ID: Stacey Harris, female    DOB: 02/19/1929, 82 y.o.   MRN: [id number]  HPI  Here for f/u of chronic health problems   Wt is down 9 lb with bmi 29  Really following a diabetic diet -now follows by the book   She was cheating before - eating the wrong things  Now daughter cooks for her and eliminated a lot of simple sugars in her diet  Exercise - mostly walking with a shopping cart  occ works a bit in the garden when safe  Some calesthenic types of exercise in the house   Diabetes  Home sugar results am fasting glucose - usually low 100s - up to 130s and occ 80s  In the afternoons -not bad -usually below 140 (none above 200)  Has not checked at bedtime   DM diet -much imp (that's is what made the difference), also drinking only water (no more diet drinks) Exercise -see above  Symptoms-none  A1C last  Lab Results  Component Value Date   HGBA1C 8.5* 08/12/2015    No problems with medications  Renal protection ace  Last eye exam -utd    bp is stable today  No cp or palpitations or headaches or edema  No side effects to medicines  BP Readings from Last 3 Encounters:  09/09/15 134/70  08/19/15 132/80  07/26/15 162/83    Took her bp med this   Re check 120/70  Eating less salt also   Patient Active Problem List   Diagnosis Date Noted  . Normocytic anemia 02/11/2015  . GERD (gastroesophageal reflux disease) 08/06/2014  . Encounter for Medicare annual wellness exam 08/03/2013  . Colon cancer screening 08/03/2013  . Thrombocytopenia ([facility]) 08/03/2013  . Insomnia 01/26/2013  . Bee sting reaction 01/26/2013  . Hypothyroid 09/17/2011  . Lumbar spinal stenosis 06/21/2011    Class: Present on Admission  . Anemia, mild 05/25/2011  . BACK PAIN, LUMBAR 08/23/2010  . POSTNASAL DRIP 08/23/2010  . OSTEOARTHRITIS, HIP 03/16/2009  . MENOPAUSAL SYNDROME 12/08/2008  . CORNS AND CALLOSITIES 08/13/2007  . BASAL CELL CARCINOMA, FACE 07/01/2007  .  BASAL CELL CARCINOMA, FACE 07/01/2007  . Type 2 diabetes, uncontrolled, with neuropathy ([facility]) 09/24/2006  . DIABETIC PERIPHERAL NEUROPATHY 09/24/2006  . Hyperlipidemia 09/24/2006  . Obesity 09/24/2006  . Essential hypertension 09/24/2006  . ROSACEA 09/24/2006  . INCONTINENCE, URGE 09/24/2006  . COLONOSCOPY AND REMOVAL OF LESION, HX OF 06/09/2003   Past Medical History  Diagnosis Date  . Hyperlipidemia   . Hypertension   . Hypothyroidism   . OA (osteoarthritis)   . Cancer ([facility])     basal cell skin CA  . Diabetes mellitus     type II  . Blood transfusion    Past Surgical History  Procedure Laterality Date  . [place]       and BSO  . Tonsillectomy    . Mohs surgery  12/08    basal cell skin cancer lt temple  . Joint replacement      rt total hip 10  . Ankle fracture surgery  10    rt  . Lumbar laminectomy/decompression microdiscectomy  06/22/2011    Procedure: LUMBAR LAMINECTOMY/DECOMPRESSION MICRODISCECTOMY;  Surgeon: Mark C Yates;  Location: [facility];  Service: Orthopedics;  Laterality: N/A;  L3-4, L4-5 Decompression   Social History  Substance Use Topics  . Smoking status: Never Smoker   . Smokeless tobacco: None  . Alcohol Use: No     Family History  Problem Relation Age of Onset  . Arthritis Father     RA  . Hypertension Father   . Heart disease Father     CAD  . Diabetes Father   . Arthritis Sister     RA  . Diabetes Brother   . Diabetes Brother   . Diabetes Brother    No Known Allergies Current Outpatient Prescriptions on File Prior to Visit  Medication Sig Dispense Refill  . ACCU-CHEK FASTCLIX LANCETS MISC Check blood sugar once daily and as directed.Dx 250.00 102 each 3  . aspirin 81 MG tablet Take 81 mg by mouth daily.      . Blood Glucose Calibration (ACCU-CHEK SMARTVIEW CONTROL) LIQD     . Blood Glucose Monitoring Suppl (ACCU-CHEK AVIVA PLUS) W/DEVICE KIT Check blood sugar once daily and as directed.Dx 250.00 1 kit 0  . diazepam (VALIUM) 5 MG tablet Take  0.5 tablets (2.5 mg total) by mouth at bedtime as needed. 30 tablet 0  . glimepiride (AMARYL) 4 MG tablet Take 1 tablet (4 mg total) by mouth daily before breakfast. 30 tablet 11  . glucose blood (ACCU-CHEK AVIVA PLUS) test strip Check blood sugar once daily and as directed.Dx 250.00 100 each 3  . hydrochlorothiazide (HYDRODIURIL) 25 MG tablet TAKE 1 TABLET BY MOUTH EVERY MORNING 30 tablet 2  . levothyroxine (SYNTHROID, LEVOTHROID) 50 MCG tablet Take 1 tablet (50 mcg total) by mouth daily. 90 tablet 3  . metFORMIN (GLUCOPHAGE) 1000 MG tablet TAKE 1 TABLET BY MOUTH 2 TIMES DAILY WITH A MEAL. 180 tablet 3  . metoprolol succinate (TOPROL-XL) 100 MG 24 hr tablet TAKE 1 TABLET BY MOUTH DAILY 30 tablet 1  . Multiple Vitamin (MULTIVITAMIN) capsule Take 1 capsule by mouth daily.    . Omega-3 Fatty Acids (FISH OIL) 1200 MG CAPS Take 1,200 mg by mouth 2 (two) times daily.      . rosuvastatin (CRESTOR) 10 MG tablet TAKE 1 TABLET EVERY DAY 90 tablet 0  . trandolapril (MAVIK) 4 MG tablet TAKE 1 TABLET TWICE DAILY 180 tablet 0   No current facility-administered medications on file prior to visit.     Review of Systems Review of Systems  Constitutional: Negative for fever, appetite change, fatigue and unexpected weight change.  Eyes: Negative for pain and visual disturbance.  Respiratory: Negative for cough and shortness of breath.   Cardiovascular: Negative for cp or palpitations    Gastrointestinal: Negative for nausea, diarrhea and constipation.  Genitourinary: Negative for urgency and frequency.  Skin: Negative for pallor or rash   Neurological: Negative for weakness, light-headedness, numbness and headaches.  Hematological: Negative for adenopathy. Does not bruise/bleed easily.  Psychiatric/Behavioral: Negative for dysphoric mood. The patient is not nervous/anxious.         Objective:   Physical Exam  Constitutional: She appears well-developed and well-nourished. No distress.  overwt and well  appearing  Wt loss noted since last visit   HENT:  Head: Normocephalic and atraumatic.  Mouth/Throat: Oropharynx is clear and moist.  Eyes: Conjunctivae and EOM are normal. Pupils are equal, round, and reactive to light.  Neck: Normal range of motion. Neck supple. No JVD present. Carotid bruit is not present. No thyromegaly present.  Cardiovascular: Normal rate, regular rhythm, normal heart sounds and intact distal pulses.  Exam reveals no gallop.   Pulmonary/Chest: Effort normal and breath sounds normal. No respiratory distress. She has no wheezes. She has no rales.  No crackles  Abdominal: Soft. Bowel sounds are   normal. She exhibits no distension, no abdominal bruit and no mass. There is no tenderness.  Musculoskeletal: She exhibits no edema.  Lymphadenopathy:    She has no cervical adenopathy.  Neurological: She is alert. She has normal reflexes.  Skin: Skin is warm and dry. No rash noted.  Psychiatric: She has a normal mood and affect.          Assessment & Plan:   Problem List Items Addressed This Visit      Cardiovascular and Mediastinum   Essential hypertension - Primary    bp in fair control at this time  BP Readings from Last 1 Encounters:  09/09/15 120/70   No changes needed Disc lifstyle change with low sodium diet and exercise          Endocrine   Type 2 diabetes, uncontrolled, with neuropathy ([place])    Lab Results  Component Value Date   HGBA1C 8.5* 08/12/2015   Since then-pt states she has followed a much improved DM diet /and more activity Self reported blood glucose readings are closer to goal (with one low) Adv to hold amaryl and update if any more low glucose readings- and not to skip meals Will continue better habits with plan to re check A1C in may      Relevant Medications   glimepiride (AMARYL) 4 MG tablet

## 2015-09-09 NOTE — Patient Instructions (Signed)
Sound like your diabetes is improving  Blood pressure is good also  Stay on the same medicines If you get more blood glucose reading under 70 - let me know (we may need to cut back the amaryl) Stay active  Keep eating the diabetic diet Good job !  Schedule non fasting labs for mid may

## 2015-09-09 NOTE — Progress Notes (Signed)
Pre visit review using our clinic review tool, if applicable. No additional management support is needed unless otherwise documented below in the visit note. 

## 2015-09-10 NOTE — Assessment & Plan Note (Signed)
Lab Results  Component Value Date   HGBA1C 8.5* 08/12/2015   Since then-pt states she has followed a much improved DM diet /and more activity Self reported blood glucose readings are closer to goal (with one low) Adv to hold amaryl and update if any more low glucose readings- and not to skip meals Will continue better habits with plan to re check A1C in may

## 2015-09-10 NOTE — Assessment & Plan Note (Signed)
bp in fair control at this time  BP Readings from Last 1 Encounters:  09/09/15 120/70   No changes needed Disc lifstyle change with low sodium diet and exercise

## 2015-09-12 ENCOUNTER — Other Ambulatory Visit: Payer: Self-pay | Admitting: Family Medicine

## 2015-09-16 ENCOUNTER — Other Ambulatory Visit: Payer: Self-pay | Admitting: Family Medicine

## 2015-10-24 ENCOUNTER — Encounter: Payer: Self-pay | Admitting: Hematology

## 2015-10-24 ENCOUNTER — Other Ambulatory Visit (HOSPITAL_BASED_OUTPATIENT_CLINIC_OR_DEPARTMENT_OTHER): Payer: Commercial Managed Care - HMO

## 2015-10-24 ENCOUNTER — Ambulatory Visit (HOSPITAL_BASED_OUTPATIENT_CLINIC_OR_DEPARTMENT_OTHER): Payer: Commercial Managed Care - HMO | Admitting: Hematology

## 2015-10-24 VITALS — BP 139/67 | HR 60 | Temp 97.6°F | Resp 18 | Ht 61.0 in | Wt 156.2 lb

## 2015-10-24 DIAGNOSIS — D649 Anemia, unspecified: Secondary | ICD-10-CM | POA: Diagnosis not present

## 2015-10-24 DIAGNOSIS — D696 Thrombocytopenia, unspecified: Secondary | ICD-10-CM | POA: Diagnosis not present

## 2015-10-24 DIAGNOSIS — D469 Myelodysplastic syndrome, unspecified: Secondary | ICD-10-CM | POA: Insufficient documentation

## 2015-10-24 LAB — CBC & DIFF AND RETIC
BASO%: 0.4 % (ref 0.0–2.0)
BASOS ABS: 0 10*3/uL (ref 0.0–0.1)
EOS%: 1.9 % (ref 0.0–7.0)
Eosinophils Absolute: 0.1 10*3/uL (ref 0.0–0.5)
HEMATOCRIT: 31.4 % — AB (ref 34.8–46.6)
HGB: 10.7 g/dL — ABNORMAL LOW (ref 11.6–15.9)
IMMATURE RETIC FRACT: 14.2 % — AB (ref 1.60–10.00)
LYMPH#: 2 10*3/uL (ref 0.9–3.3)
LYMPH%: 36.5 % (ref 14.0–49.7)
MCH: 32.7 pg (ref 25.1–34.0)
MCHC: 34.1 g/dL (ref 31.5–36.0)
MCV: 96 fL (ref 79.5–101.0)
MONO#: 0.6 10*3/uL (ref 0.1–0.9)
MONO%: 10.4 % (ref 0.0–14.0)
NEUT#: 2.8 10*3/uL (ref 1.5–6.5)
NEUT%: 50.8 % (ref 38.4–76.8)
Platelets: 116 10*3/uL — ABNORMAL LOW (ref 145–400)
RBC: 3.27 10*6/uL — AB (ref 3.70–5.45)
RDW: 14.1 % (ref 11.2–14.5)
RETIC %: 1.56 % (ref 0.70–2.10)
RETIC CT ABS: 51.01 10*3/uL (ref 33.70–90.70)
WBC: 5.4 10*3/uL (ref 3.9–10.3)

## 2015-10-24 LAB — COMPREHENSIVE METABOLIC PANEL
ALBUMIN: 3.3 g/dL — AB (ref 3.5–5.0)
ALK PHOS: 52 U/L (ref 40–150)
ALT: 22 U/L (ref 0–55)
AST: 39 U/L — ABNORMAL HIGH (ref 5–34)
Anion Gap: 9 mEq/L (ref 3–11)
BILIRUBIN TOTAL: 0.83 mg/dL (ref 0.20–1.20)
BUN: 18 mg/dL (ref 7.0–26.0)
CO2: 23 mEq/L (ref 22–29)
CREATININE: 1 mg/dL (ref 0.6–1.1)
Calcium: 9.9 mg/dL (ref 8.4–10.4)
Chloride: 107 mEq/L (ref 98–109)
EGFR: 52 mL/min/{1.73_m2} — AB (ref >90)
GLUCOSE: 137 mg/dL (ref 70–140)
Potassium: 4.5 mEq/L (ref 3.5–5.1)
SODIUM: 139 meq/L (ref 136–145)
TOTAL PROTEIN: 6.5 g/dL (ref 6.4–8.3)

## 2015-10-26 ENCOUNTER — Other Ambulatory Visit: Payer: Self-pay | Admitting: Family Medicine

## 2015-11-17 ENCOUNTER — Other Ambulatory Visit (INDEPENDENT_AMBULATORY_CARE_PROVIDER_SITE_OTHER): Payer: Commercial Managed Care - HMO

## 2015-11-17 DIAGNOSIS — E1140 Type 2 diabetes mellitus with diabetic neuropathy, unspecified: Secondary | ICD-10-CM | POA: Diagnosis not present

## 2015-11-17 DIAGNOSIS — E1165 Type 2 diabetes mellitus with hyperglycemia: Secondary | ICD-10-CM | POA: Diagnosis not present

## 2015-11-17 DIAGNOSIS — I10 Essential (primary) hypertension: Secondary | ICD-10-CM

## 2015-11-17 DIAGNOSIS — IMO0002 Reserved for concepts with insufficient information to code with codable children: Secondary | ICD-10-CM

## 2015-11-17 LAB — BASIC METABOLIC PANEL
BUN: 29 mg/dL — AB (ref 6–23)
CHLORIDE: 100 meq/L (ref 96–112)
CO2: 24 mEq/L (ref 19–32)
CREATININE: 1.1 mg/dL (ref 0.40–1.20)
Calcium: 10 mg/dL (ref 8.4–10.5)
GFR: 50.23 mL/min — AB (ref >60.00)
Glucose, Bld: 92 mg/dL (ref 70–99)
Potassium: 4.3 mEq/L (ref 3.5–5.1)
Sodium: 135 mEq/L (ref 135–145)

## 2015-11-17 LAB — HEMOGLOBIN A1C: HEMOGLOBIN A1C: 5.9 % (ref 4.6–6.5)

## 2015-11-22 ENCOUNTER — Telehealth: Payer: Self-pay | Admitting: Family Medicine

## 2015-11-22 NOTE — Telephone Encounter (Signed)
Patient returned call about her lab results.

## 2015-11-23 NOTE — Telephone Encounter (Signed)
Pt was notified of results

## 2015-12-01 NOTE — Progress Notes (Signed)
[name]  HEMATOLOGY ONCOLOGY PROGRESS NOTE  Date of service: 10/24/2015  Patient Care Team: [name], MD as PCP - General  Diagnosis: Chronic anemia and thrombocytopenia likely related to MDS  Current Treatment:  -current monitoring of Blood counts -would consider BM Bx +/- ESA/Iron if worsening anemia  INTERVAL HISTORY:  Patient notes she is doing well overall 8 it had a mechanical fall with some injury to her right face with some sinus pain. Notes a little for cough with some phlegm that is yellowish-green but is resolving. No fevers or chills. Energy levels about the same. No issues with bleeding or bruising.   REVIEW OF SYSTEMS:    10 Point review of systems of done and is negative except as noted above.  . Past Medical History  Diagnosis Date  . Hyperlipidemia   . Hypertension   . Hypothyroidism   . OA (osteoarthritis)   . Cancer ([place])     basal cell skin CA  . Diabetes mellitus     type II  . Blood transfusion     . Past Surgical History  Procedure Laterality Date  . [place]       and BSO  . Tonsillectomy    . Mohs surgery  12/08    basal cell skin cancer lt temple  . Joint replacement      rt total hip 10  . Ankle fracture surgery  10    rt  . Lumbar laminectomy/decompression microdiscectomy  06/22/2011    Procedure: LUMBAR LAMINECTOMY/DECOMPRESSION MICRODISCECTOMY;  Surgeon: Marybelle Killings;  Location: [facility];  Service: Orthopedics;  Laterality: N/A;  L3-4, L4-5 Decompression    . Social History  Substance Use Topics  . Smoking status: Never Smoker   . Smokeless tobacco: None  . Alcohol Use: No    ALLERGIES:  has No Known Allergies.  MEDICATIONS:  Current Outpatient Prescriptions  Medication Sig Dispense Refill  . ACCU-CHEK FASTCLIX LANCETS MISC Check blood sugar once daily and as directed.Dx 250.00 102 each 3  . aspirin 81 MG tablet Take 81 mg by mouth daily.      . Blood Glucose Calibration (ACCU-CHEK SMARTVIEW CONTROL) LIQD     . Blood Glucose  Monitoring Suppl (ACCU-CHEK AVIVA PLUS) W/DEVICE KIT Check blood sugar once daily and as directed.Dx 250.00 1 kit 0  . diazepam (VALIUM) 5 MG tablet Take 0.5 tablets (2.5 mg total) by mouth at bedtime as needed. 30 tablet 0  . glimepiride (AMARYL) 4 MG tablet Take 1 tablet (4 mg total) by mouth daily before breakfast. 90 tablet 3  . glucose blood (ACCU-CHEK AVIVA PLUS) test strip Check blood sugar once daily and as directed.Dx 250.00 100 each 3  . hydrochlorothiazide (HYDRODIURIL) 25 MG tablet TAKE 1 TABLET BY MOUTH EVERY MORNING 30 tablet 5  . levothyroxine (SYNTHROID, LEVOTHROID) 50 MCG tablet Take 1 tablet (50 mcg total) by mouth daily. 90 tablet 3  . metFORMIN (GLUCOPHAGE) 1000 MG tablet TAKE 1 TABLET BY MOUTH 2 TIMES DAILY WITH A MEAL. 180 tablet 3  . metoprolol succinate (TOPROL-XL) 100 MG 24 hr tablet TAKE 1 TABLET BY MOUTH DAILY 30 tablet 5  . Multiple Vitamin (MULTIVITAMIN) capsule Take 1 capsule by mouth daily.    . Omega-3 Fatty Acids (FISH OIL) 1200 MG CAPS Take 1,200 mg by mouth 2 (two) times daily.      . rosuvastatin (CRESTOR) 10 MG tablet TAKE 1 TABLET EVERY DAY 90 tablet 0  . trandolapril (MAVIK) 4 MG tablet TAKE 1  TABLET TWICE DAILY 180 tablet 1   No current facility-administered medications for this visit.    PHYSICAL EXAMINATION: ECOG PERFORMANCE STATUS: 1 - Symptomatic but completely ambulatory  . Filed Vitals:   10/24/15 1050  BP: 139/67  Pulse: 60  Temp: 97.6 F (36.4 C)  Resp: 18    Filed Weights   10/24/15 1050  Weight: 156 lb 3.2 oz (70.852 kg)   .Body mass index is 29.53 kg/(m^2).  GENERAL:alert, in no acute distress and comfortable SKIN: skin color, texture, turgor are normal, no rashes or significant lesions EYES: normal, conjunctiva are pink and non-injected, sclera clear OROPHARYNX:no exudate, no erythema and lips, buccal mucosa, and tongue normal  NECK: supple, no JVD, thyroid normal size, non-tender, without nodularity LYMPH: no palpable  lymphadenopathy in the cervical, axillary or inguinal LUNGS: clear to auscultation with normal respiratory effort HEART: regular rate & rhythm, no murmurs and no lower extremity edema ABDOMEN: abdomen soft, non-tender, normoactive bowel sounds  Musculoskeletal: no cyanosis of digits and no clubbing  PSYCH: alert & oriented x 3 with fluent speech NEURO: no focal motor/sensory deficits  LABORATORY DATA:   . CBC Latest Ref Rng 10/24/2015 07/26/2015 04/25/2015  WBC 3.9 - 10.3 10e3/uL 5.4 5.0 5.3  Hemoglobin 11.6 - 15.9 g/dL 10.7(L) 11.1(L) 11.1(L)  Hematocrit 34.8 - 46.6 % 31.4(L) 33.4(L) 33.4(L)  Platelets 145 - 400 10e3/uL 116(L) 109(L) 96(L)   CMP Latest Ref Rng 10/24/2015 08/12/2015  Glucose 70 - 99 mg/dL 137 163(H)  BUN 6 - 23 mg/dL 18.0 29(H)  Creatinine 0.40 - 1.20 mg/dL 1.0 0.94  Sodium 135 - 145 mEq/L 139 143  Potassium 3.5 - 5.1 mEq/L 4.5 4.1  Chloride 96 - 112 mEq/L - 109  CO2 19 - 32 mEq/L 23 25  Calcium 8.4 - 10.5 mg/dL 9.9 9.7  Total Protein 6.4 - 8.3 g/dL 6.5 6.6  Total Bilirubin 0.20 - 1.20 mg/dL 0.83 0.7  Alkaline Phos 40 - 150 U/L 52 53  AST 5 - 34 U/L 39(H) 31  ALT 0 - 55 U/L 22 23   I have reviewed the data as listed  RADIOGRAPHIC STUDIES: I have personally reviewed the radiological images as listed and agreed with the findings in the report. No results found.  ASSESSMENT & PLAN:   Stacey Harris is a very sweet 82 year old female with  1) Chronic anemia. Normocytic but with high normal MCV levels. No evidence of hemolysis on peripheral blood smear or labs. Peripheral blood smear shows macrocytic RBC population. Normal B12. No history of alcohol. Hypothyroidism is well treated. Presence of large platelets and dysplastic WBCs in addition to macrocytic RBCs is consistent with the possibility of myelodysplastic syndrome. Hemoglobin is stable at 10.7. 2) mild Thrombocytopenia -the counts are being underestimated at least partly due to platelet clumping on the  smear as noted above. Platelets are stable at 116k It is also partly due to her presentation of myelodysplastic syndrome. Plan -Patient's anemia and some cytopenia is stable at this time. -No issues with worsening fatigue infections or bleeding. -No indication for bone marrow biopsy at this time unless her counts significantly worsen -We will recheck ferritin on next visit. Replace to maintain ferritin >50 currently or >100 if patient more anemic and ESA's use contemplated.  -Return to care with Dr. Irene Limbo in 4 months with repeat blood counts CBC, CMP, and ferritin levels. Earlier if any other acute concerns .  [name] Orders Placed This Encounter  Procedures  . CBC & Diff and Retic  Standing Status: Future     Number of Occurrences:      Standing Expiration Date: 11/27/2016  . Ferritin    Standing Status: Future     Number of Occurrences:      Standing Expiration Date: 10/23/2016  . Comprehensive metabolic panel    Standing Status: Future     Number of Occurrences:      Standing Expiration Date: 10/23/2016    I spent 15 minutes counseling the patient face to face. The total time spent in the appointment was 20 minutes and more than 50% was on counseling and direct patient cares.    Sullivan Lone MD [place] AAHIVMS [facility] Hematology/Oncology Physician [facility]  (Office):       [phone] (Work cell):  [phone] (Fax):           [phone]

## 2015-12-01 NOTE — Progress Notes (Signed)
Stacey Harris  HEMATOLOGY ONCOLOGY PROGRESS NOTE  Date of service: 07/26/2015   Patient Care Team: [name], MD as PCP - General  Diagnosis: Chronic anemia and thrombocytopenia likely related to MDS  Current Treatment:  -current monitoring of Blood counts -would consider BM Bx +/- ESA/Iron if worsening anemia  INTERVAL HISTORY: Patient notes that she remains active and has no other acute new symptoms. Her energy levels are about the same. Hemoglobin is 11.1 and platelets are 109k. No issues with bleeding or bruising. No issues with infections since her last visit. Some chronic back and knee pains due to arthritis. No fevers no chills no night sweats no enlarged lymph nodes no abdominal pain no new chest pain.   REVIEW OF SYSTEMS:    10 Point review of systems of done and is negative except as noted above.  . Past Medical History  Diagnosis Date  . Hyperlipidemia   . Hypertension   . Hypothyroidism   . OA (osteoarthritis)   . Cancer ([place])     basal cell skin CA  . Diabetes mellitus     type II  . Blood transfusion     . Past Surgical History  Procedure Laterality Date  . [place]       and BSO  . Tonsillectomy    . Mohs surgery  12/08    basal cell skin cancer lt temple  . Joint replacement      rt total hip 10  . Ankle fracture surgery  10    rt  . Lumbar laminectomy/decompression microdiscectomy  06/22/2011    Procedure: LUMBAR LAMINECTOMY/DECOMPRESSION MICRODISCECTOMY;  Surgeon: Marybelle Killings;  Location: [place] [facility];  Service: Orthopedics;  Laterality: N/A;  L3-4, L4-5 Decompression    . Social History  Substance Use Topics  . Smoking status: Never Smoker   . Smokeless tobacco: None  . Alcohol Use: No    ALLERGIES:  has No Known Allergies.  MEDICATIONS:  Current Outpatient Prescriptions  Medication Sig Dispense Refill  . ACCU-CHEK FASTCLIX LANCETS MISC Check blood sugar once daily and as directed.Dx 250.00 102 each 3  . aspirin 81 MG tablet Take 81 mg by mouth  daily.      . Blood Glucose Calibration (ACCU-CHEK SMARTVIEW CONTROL) LIQD     . Blood Glucose Monitoring Suppl (ACCU-CHEK AVIVA PLUS) W/DEVICE KIT Check blood sugar once daily and as directed.Dx 250.00 1 kit 0  . diazepam (VALIUM) 5 MG tablet Take 0.5 tablets (2.5 mg total) by mouth at bedtime as needed. 30 tablet 0  . glucose blood (ACCU-CHEK AVIVA PLUS) test strip Check blood sugar once daily and as directed.Dx 250.00 100 each 3  . Multiple Vitamin (MULTIVITAMIN) capsule Take 1 capsule by mouth daily.    . Omega-3 Fatty Acids (FISH OIL) 1200 MG CAPS Take 1,200 mg by mouth 2 (two) times daily.      Stacey Harris glimepiride (AMARYL) 4 MG tablet Take 1 tablet (4 mg total) by mouth daily before breakfast. 90 tablet 3  . hydrochlorothiazide (HYDRODIURIL) 25 MG tablet TAKE 1 TABLET BY MOUTH EVERY MORNING 30 tablet 5  . levothyroxine (SYNTHROID, LEVOTHROID) 50 MCG tablet Take 1 tablet (50 mcg total) by mouth daily. 90 tablet 3  . metFORMIN (GLUCOPHAGE) 1000 MG tablet TAKE 1 TABLET BY MOUTH 2 TIMES DAILY WITH A MEAL. 180 tablet 3  . metoprolol succinate (TOPROL-XL) 100 MG 24 hr tablet TAKE 1 TABLET BY MOUTH DAILY 30 tablet 5  . rosuvastatin (CRESTOR) 10 MG tablet TAKE 1  TABLET EVERY DAY 90 tablet 0  . trandolapril (MAVIK) 4 MG tablet TAKE 1 TABLET TWICE DAILY 180 tablet 1   No current facility-administered medications for this visit.    PHYSICAL EXAMINATION: ECOG PERFORMANCE STATUS: 1 - Symptomatic but completely ambulatory  . Filed Vitals:   07/26/15 1020  BP: 162/83  Pulse: 66  Temp: 98 F (36.7 C)  Resp: 17    Filed Weights   07/26/15 1020  Weight: 168 lb 3.2 oz (76.295 kg)   .Body mass index is 31.8 kg/(m^2).  GENERAL:alert, in no acute distress and comfortable SKIN: skin color, texture, turgor are normal, no rashes or significant lesions EYES: normal, conjunctiva are pink and non-injected, sclera clear OROPHARYNX:no exudate, no erythema and lips, buccal mucosa, and tongue normal  NECK:  supple, no JVD, thyroid normal size, non-tender, without nodularity LYMPH: no palpable lymphadenopathy in the cervical, axillary or inguinal LUNGS: clear to auscultation with normal respiratory effort HEART: regular rate & rhythm, no murmurs and no lower extremity edema ABDOMEN: abdomen soft, non-tender, normoactive bowel sounds  Musculoskeletal: no cyanosis of digits and no clubbing  PSYCH: alert & oriented x 3 with fluent speech NEURO: no focal motor/sensory deficits  LABORATORY DATA:  . CBC Latest Ref Rng 07/26/2015 04/25/2015  WBC 3.9 - 10.3 10e3/uL 5.0 5.3  Hemoglobin 11.6 - 15.9 g/dL 11.1(L) 11.1(L)  Hematocrit 34.8 - 46.6 % 33.4(L) 33.4(L)  Platelets 145 - 400 10e3/uL 109(L) 96(L)   Component     Latest Ref Rng 07/26/2015  Sodium     136 - 145 mEq/L 142  Potassium     3.5 - 5.1 mEq/L 4.3  Chloride     98 - 109 mEq/L 107  CO2     22 - 29 mEq/L 26  Glucose     70 - 140 mg/dl 233 (H)  BUN     7.0 - 26.0 mg/dL 26.9 (H)  Creatinine     0.6 - 1.1 mg/dL 1.1  Total Bilirubin     0.20 - 1.20 mg/dL 0.78  Alkaline Phosphatase     40 - 150 U/L 75  AST     5 - 34 U/L 35 (H)  ALT     0 - 55 U/L 30  Total Protein     6.4 - 8.3 g/dL 6.8  Albumin     3.5 - 5.0 g/dL 3.3 (L)  Calcium     8.4 - 10.4 mg/dL 10.6 (H)  Anion gap     3 - 11 mEq/L 9  EGFR     >90 ml/min/1.73 m2 49 (L)     I have reviewed the data as listed  RADIOGRAPHIC STUDIES: I have personally reviewed the radiological images as listed and agreed with the findings in the report. No results found.  ASSESSMENT & PLAN:   Mrs. Crandle is a very sweet 82 year old female with  1) Chronic anemia. Normocytic but with high normal MCV levels. No evidence of hemolysis on peripheral blood smear or labs. Peripheral blood smear shows macrocytic RBC population. Normal B12. No history of alcohol. Hypothyroidism is well treated. Presence of large platelets and dysplastic WBCs in addition to macrocytic RBCs is  consistent with the possibility of myelodysplastic syndrome. 2) mild Thrombocytopenia -the counts are being underestimated at least partly due to platelet clumping on the smear as noted above. It is also partly due to her presentation of myelodysplastic syndrome. Plan -Patient's anemia and some cytopenia is stable at this time. -No issues with worsening fatigue  infections or bleeding. -We'll consider doing bone marrow biopsy if her blood counts start to worsen to check for specifics of her myelodysplastic syndrome especially cytogenetics.  -Return to care with Dr. Irene Limbo in 3 months with repeat blood counts CBC, CMP. Earlier if any other acute concerns .  Stacey Harris Orders Placed This Encounter  Procedures  . CBC & Diff and Retic    Standing Status: Future     Number of Occurrences: 1     Standing Expiration Date: 08/29/2016  . Comprehensive metabolic panel    Standing Status: Future     Number of Occurrences: 1     Standing Expiration Date: 07/25/2016    I spent 15 minutes counseling the patient face to face. The total time spent in the appointment was 20 minutes and more than 50% was on counseling and direct patient cares.    Sullivan Lone MD [place] AAHIVMS [facility] Hematology/Oncology Physician [facility]  (Office):       [phone] (Work cell):  [phone] (Fax):           [phone]

## 2015-12-26 ENCOUNTER — Telehealth: Payer: Self-pay | Admitting: Family Medicine

## 2015-12-26 NOTE — Telephone Encounter (Signed)
LM for pt to sch AWV with Lesia on 6/23 at any available time, or any day over the next few weeks that she is available. mn

## 2015-12-30 ENCOUNTER — Ambulatory Visit (INDEPENDENT_AMBULATORY_CARE_PROVIDER_SITE_OTHER): Payer: Commercial Managed Care - HMO

## 2015-12-30 ENCOUNTER — Other Ambulatory Visit: Payer: Self-pay

## 2015-12-30 VITALS — BP 118/72 | HR 59 | Temp 98.3°F | Ht 61.0 in | Wt 150.2 lb

## 2015-12-30 DIAGNOSIS — Z0000 Encounter for general adult medical examination without abnormal findings: Secondary | ICD-10-CM

## 2015-12-30 MED ORDER — DIAZEPAM 5 MG PO TABS: 2.5000 mg | ORAL_TABLET | Freq: Every evening | ORAL | Status: DC | PRN

## 2015-12-30 NOTE — Progress Notes (Signed)
PCP notes:  Health maintenance:   PCV13- postponed/pt may have taken @ [facility]  Abnormal screenings:   Hearing - failed  Patient concerns: None  Nurse concerns: None  Next PCP appt: 02/24/2016 @ 1215  I reviewed health advisor's note, was available for consultation, and agree with documentation and plan. Loura Pardon MD

## 2015-12-30 NOTE — Telephone Encounter (Signed)
Rx faxed to [facility] order pharmacy

## 2015-12-30 NOTE — Telephone Encounter (Signed)
Pt was in today for AWV. Pt requested a refill for Diazepam. Pt has requested prescription be sent to mail order pharmacy.

## 2015-12-30 NOTE — Progress Notes (Signed)
Pre visit review using our clinic review tool, if applicable. No additional management support is needed unless otherwise documented below in the visit note. 

## 2015-12-30 NOTE — Patient Instructions (Signed)
Stacey Harris , Thank you for taking time to come for your Medicare Wellness Visit. I appreciate your ongoing commitment to your health goals. Please review the following plan we discussed and let me know if I can assist you in the future.   These are the goals we discussed: Goals    . Increase physical activity     Starting 12/30/2015, I will continue to exercise for at least 30 min daily.        This is a list of the screening recommended for you and due dates:  Health Maintenance  Topic Date Due  . Pneumonia vaccines (2 of 2 - PCV13) 02/24/2016*  . Mammogram  08/10/2020*  . DEXA scan (bone density measurement)  08/10/2020*  . Tetanus Vaccine  08/10/2020*  . Flu Shot  02/07/2016  . Hemoglobin A1C  05/19/2016  . Eye exam for diabetics  07/18/2016  . Complete foot exam   08/18/2016  . Shingles Vaccine  Completed  *Topic was postponed. The date shown is not the original due date.    Preventive Care for Adults  A healthy lifestyle and preventive care can promote health and wellness. Preventive health guidelines for adults include the following key practices.  . A routine yearly physical is a good way to check with your health care provider about your health and preventive screening. It is a chance to share any concerns and updates on your health and to receive a thorough exam.  . Visit your dentist for a routine exam and preventive care every 6 months. Brush your teeth twice a day and floss once a day. Good oral hygiene prevents tooth decay and gum disease.  . The frequency of eye exams is based on your age, health, family medical history, use  of contact lenses, and other factors. Follow your health care provider's ecommendations for frequency of eye exams.  . Eat a healthy diet. Foods like vegetables, fruits, whole grains, low-fat dairy products, and lean protein foods contain the nutrients you need without too many calories. Decrease your intake of foods high in solid fats, added  sugars, and salt. Eat the right amount of calories for you. Get information about a proper diet from your health care provider, if necessary.  . Regular physical exercise is one of the most important things you can do for your health. Most adults should get at least 150 minutes of moderate-intensity exercise (any activity that increases your heart rate and causes you to sweat) each week. In addition, most adults need muscle-strengthening exercises on 2 or more days a week.  Silver Sneakers may be a benefit available to you. To determine eligibility, you may visit the website: www.silversneakers.com or contact program at (419) 323-2967 Mon-Fri between 8AM-8PM.   . Maintain a healthy weight. The body mass index (BMI) is a screening tool to identify possible weight problems. It provides an estimate of body fat based on height and weight. Your health care provider can find your BMI and can help you achieve or maintain a healthy weight.   For adults 20 years and older: ? A BMI below 18.5 is considered underweight. ? A BMI of 18.5 to 24.9 is normal. ? A BMI of 25 to 29.9 is considered overweight. ? A BMI of 30 and above is considered obese.   . Maintain normal blood lipids and cholesterol levels by exercising and minimizing your intake of saturated fat. Eat a balanced diet with plenty of fruit and vegetables. Blood tests for lipids and cholesterol should  begin at age 27 and be repeated every 5 years. If your lipid or cholesterol levels are high, you are over 50, or you are at high risk for heart disease, you may need your cholesterol levels checked more frequently. Ongoing high lipid and cholesterol levels should be treated with medicines if diet and exercise are not working.  . If you smoke, find out from your health care provider how to quit. If you do not use tobacco, please do not start.  . If you choose to drink alcohol, please do not consume more than 2 drinks per day. One drink is considered to  be 12 ounces (355 mL) of beer, 5 ounces (148 mL) of wine, or 1.5 ounces (44 mL) of liquor.  . If you are 39-75 years old, ask your health care provider if you should take aspirin to prevent strokes.  . Use sunscreen. Apply sunscreen liberally and repeatedly throughout the day. You should seek shade when your shadow is shorter than you. Protect yourself by wearing long sleeves, pants, a wide-brimmed hat, and sunglasses year round, whenever you are outdoors.  . Once a month, do a whole body skin exam, using a mirror to look at the skin on your back. Tell your health care provider of new moles, moles that have irregular borders, moles that are larger than a pencil eraser, or moles that have changed in shape or color.

## 2015-12-30 NOTE — Progress Notes (Signed)
Subjective:   Stacey Harris is a 82 y.o. female who presents for Medicare Annual (Subsequent) preventive examination.  Review of Systems:  N/A Cardiac Risk Factors include: dyslipidemia;diabetes mellitus;advanced age (>56mn, >>12women);hypertension     Objective:     Vitals: BP 118/72 mmHg  Pulse 59  Temp(Src) 98.3 F (36.8 C) (Oral)  Ht 5' 1"  (1.549 m)  Wt 150 lb 4 oz (68.153 kg)  BMI 28.40 kg/m2  SpO2 97%  Body mass index is 28.4 kg/(m^2).   Tobacco History  Smoking status  . Never Smoker   Smokeless tobacco  . Not on file     Counseling given: No   Past Medical History  Diagnosis Date  . Hyperlipidemia   . Hypertension   . Hypothyroidism   . OA (osteoarthritis)   . Cancer ([place]     basal cell skin CA  . Diabetes mellitus     type II  . Blood transfusion    Past Surgical History  Procedure Laterality Date  . [place]       and BSO  . Tonsillectomy    . Mohs surgery  12/08    basal cell skin cancer lt temple  . Joint replacement      rt total hip 10  . Ankle fracture surgery  10    rt  . Lumbar laminectomy/decompression microdiscectomy  06/22/2011    Procedure: LUMBAR LAMINECTOMY/DECOMPRESSION MICRODISCECTOMY;  Surgeon: MMarybelle Killings  [facility]  Service: Orthopedics;  Laterality: N/A;  L3-4, L4-5 Decompression   Family History  Problem Relation Age of Onset  . Arthritis Father     RA  . Hypertension Father   . Heart disease Father     CAD  . Diabetes Father   . Arthritis Sister     RA  . Diabetes Brother   . Diabetes Brother   . Diabetes Brother    History  Sexual Activity  . Sexual Activity: No    Outpatient Encounter Prescriptions as of 12/30/2015  Medication Sig  . ACCU-CHEK FASTCLIX LANCETS MISC Check blood sugar once daily and as directed.Dx 250.00  . aspirin 81 MG tablet Take 81 mg by mouth daily.    . Blood Glucose Calibration (ACCU-CHEK SMARTVIEW CONTROL) LIQD   . Blood Glucose Monitoring Suppl (ACCU-CHEK AVIVA PLUS)  W/DEVICE KIT Check blood sugar once daily and as directed.Dx 250.00  . diazepam (VALIUM) 5 MG tablet Take 0.5 tablets (2.5 mg total) by mouth at bedtime as needed.  .Marland Kitchenglimepiride (AMARYL) 4 MG tablet Take 1 tablet (4 mg total) by mouth daily before breakfast.  . glucose blood (ACCU-CHEK AVIVA PLUS) test strip Check blood sugar once daily and as directed.Dx 250.00  . hydrochlorothiazide (HYDRODIURIL) 25 MG tablet TAKE 1 TABLET BY MOUTH EVERY MORNING  . levothyroxine (SYNTHROID, LEVOTHROID) 50 MCG tablet Take 1 tablet (50 mcg total) by mouth daily.  . metFORMIN (GLUCOPHAGE) 1000 MG tablet TAKE 1 TABLET BY MOUTH 2 TIMES DAILY WITH A MEAL.  . metoprolol succinate (TOPROL-XL) 100 MG 24 hr tablet TAKE 1 TABLET BY MOUTH DAILY  . Multiple Vitamin (MULTIVITAMIN) capsule Take 1 capsule by mouth daily.  . Omega-3 Fatty Acids (FISH OIL) 1200 MG CAPS Take 1,200 mg by mouth 2 (two) times daily.    . rosuvastatin (CRESTOR) 10 MG tablet TAKE 1 TABLET EVERY DAY  . trandolapril (MAVIK) 4 MG tablet TAKE 1 TABLET TWICE DAILY   No facility-administered encounter medications on file as of 12/30/2015.  Activities of Daily Living In your present state of health, do you have any difficulty performing the following activities: 12/30/2015  Hearing? N  Vision? N  Difficulty concentrating or making decisions? N  Walking or climbing stairs? N  Dressing or bathing? N  Doing errands, shopping? N  Preparing Food and eating ? N  Using the Toilet? N  In the past six months, have you accidently leaked urine? Y  Do you have problems with loss of bowel control? Y  Managing your Medications? N  Managing your Finances? N  Housekeeping or managing your Housekeeping? N    Patient Care Team: [name], MD as PCP - General Monna Fam, MD as Consulting Physician (Ophthalmology)    Assessment:     Hearing Screening   125Hz  250Hz  500Hz  1000Hz  2000Hz  4000Hz  8000Hz   Right ear:   0 0 40 0   Left ear:   0 0 0 0     Vision Screening Comments: Last appt in 2017 with Dr. Herbert Deaner   Exercise Activities and Dietary recommendations Current Exercise Habits: Home exercise routine, Type of exercise: walking, Time (Minutes): 30, Frequency (Times/Week): 7, Weekly Exercise (Minutes/Week): 210, Intensity: Moderate, Exercise limited by: None identified  Goals    . Increase physical activity     Starting 12/30/2015, I will continue to exercise for at least 30 min daily.       Fall Risk Fall Risk  12/30/2015 08/06/2014 08/03/2013  Falls in the past year? No Yes Yes  Number falls in past yr: - 2 or more 1  Injury with Fall? - - Yes   Depression Screen PHQ 2/9 Scores 12/30/2015 08/06/2014 08/03/2013 04/29/2012  PHQ - 2 Score 0 0 0 0     Cognitive Testing MMSE - Mini Mental State Exam 12/30/2015  Orientation to time 5  Orientation to Place 5  Registration 3  Attention/ Calculation 0  Recall 3  Language- name 2 objects 0  Language- repeat 1  Language- follow 3 step command 3  Language- read & follow direction 0  Write a sentence 0  Copy design 0  Total score 20   PLEASE NOTE: A Mini-Cog screen was completed. Maximum score is 20. A value of 0 denotes this part of Folstein MMSE was not completed or the patient failed this part of the Mini-Cog screening.   Mini-Cog Screening Orientation to Time - Max 5 pts Orientation to Place - Max 5 pts Registration - Max 3 pts Recall - Max 3 pts Language Repeat - Max 1 pts Language Follow 3 Step Command - Max 3 pts  Immunization History  Administered Date(s) Administered  . H1N1 06/17/2008  . Influenza Split 05/25/2011, 04/28/2012  . Influenza Whole 04/16/2006, 05/14/2007  . Influenza, High Dose Seasonal PF 05/20/2015  . Influenza,inj,Quad PF,36+ Mos 05/21/2013, 04/28/2014  . Pneumococcal Polysaccharide-23 07/09/2002  . Td 07/09/2002  . Zoster 02/15/2010   Screening Tests Health Maintenance  Topic Date Due  . PNA vac Low Risk Adult (2 of 2 - PCV13) 02/24/2016  (Originally 07/10/2003)  . MAMMOGRAM  08/10/2020 (Originally 03/09/2006)  . DEXA SCAN  08/10/2020 (Originally 05/08/1996)  . TETANUS/TDAP  08/10/2020 (Originally 07/09/2012)  . INFLUENZA VACCINE  02/07/2016  . HEMOGLOBIN A1C  05/19/2016  . OPHTHALMOLOGY EXAM  07/18/2016  . FOOT EXAM  08/18/2016  . ZOSTAVAX  Completed      Plan:     I have personally reviewed and addressed the Medicare Annual Wellness questionnaire and have noted the following in the  patient's chart:  A. Medical and social history B. Use of alcohol, tobacco or illicit drugs  C. Current medications and supplements D. Functional ability and status E.  Nutritional status F.  Physical activity G. Advance directives H. List of other physicians I.  Hospitalizations, surgeries, and ER visits in previous 12 months J.  [place] to include hearing, vision, cognitive, depression L. Referrals and appointments - none  In addition, I have reviewed and discussed with patient certain preventive protocols, quality metrics, and best practice recommendations. A written personalized care plan for preventive services as well as general preventive health recommendations were provided to patient.  See attached scanned questionnaire for additional information.   Signed,   Lindell Noe, MHA, BS, LPN Health Advisor

## 2015-12-30 NOTE — Telephone Encounter (Signed)
I will print to fax for mail order She does not use often so I will stay with 30 pills instead of 90 Careful regarding sedation and habit as always

## 2016-01-17 ENCOUNTER — Other Ambulatory Visit: Payer: Self-pay | Admitting: Family Medicine

## 2016-02-01 ENCOUNTER — Telehealth: Payer: Self-pay | Admitting: Hematology

## 2016-02-01 NOTE — Telephone Encounter (Signed)
Called patient to confirm appointment. Left message. Appointment letter and schedule mailed. Stacey F. °

## 2016-02-12 ENCOUNTER — Telehealth: Payer: Self-pay | Admitting: Family Medicine

## 2016-02-12 DIAGNOSIS — IMO0002 Reserved for concepts with insufficient information to code with codable children: Secondary | ICD-10-CM

## 2016-02-12 DIAGNOSIS — E1165 Type 2 diabetes mellitus with hyperglycemia: Principal | ICD-10-CM

## 2016-02-12 DIAGNOSIS — E1140 Type 2 diabetes mellitus with diabetic neuropathy, unspecified: Secondary | ICD-10-CM

## 2016-02-12 DIAGNOSIS — Z0000 Encounter for general adult medical examination without abnormal findings: Secondary | ICD-10-CM | POA: Insufficient documentation

## 2016-02-12 NOTE — Telephone Encounter (Signed)
-----   Message from [name] sent at 02/10/2016  9:26 AM EDT ----- Regarding: 3 mo f/u labs Fri 8/11, need orders. Thanks :-) Please order future 3 mo f/u labs for pt's upcoming lab appt. Thanks [name]

## 2016-02-17 ENCOUNTER — Other Ambulatory Visit (INDEPENDENT_AMBULATORY_CARE_PROVIDER_SITE_OTHER): Payer: Commercial Managed Care - HMO

## 2016-02-17 DIAGNOSIS — E1140 Type 2 diabetes mellitus with diabetic neuropathy, unspecified: Secondary | ICD-10-CM

## 2016-02-17 DIAGNOSIS — Z0000 Encounter for general adult medical examination without abnormal findings: Secondary | ICD-10-CM

## 2016-02-17 DIAGNOSIS — E1165 Type 2 diabetes mellitus with hyperglycemia: Secondary | ICD-10-CM

## 2016-02-17 DIAGNOSIS — IMO0002 Reserved for concepts with insufficient information to code with codable children: Secondary | ICD-10-CM

## 2016-02-17 LAB — COMPREHENSIVE METABOLIC PANEL
ALBUMIN: 3.5 g/dL (ref 3.5–5.2)
ALK PHOS: 52 U/L (ref 39–117)
ALT: 21 U/L (ref 0–35)
AST: 36 U/L (ref 0–37)
BILIRUBIN TOTAL: 0.8 mg/dL (ref 0.2–1.2)
BUN: 27 mg/dL — AB (ref 6–23)
CO2: 26 mEq/L (ref 19–32)
CREATININE: 1.07 mg/dL (ref 0.40–1.20)
Calcium: 9.6 mg/dL (ref 8.4–10.5)
Chloride: 108 mEq/L (ref 96–112)
GFR: 51.83 mL/min — ABNORMAL LOW (ref >60.00)
GLUCOSE: 92 mg/dL (ref 70–99)
POTASSIUM: 4.1 meq/L (ref 3.5–5.1)
SODIUM: 142 meq/L (ref 135–145)
TOTAL PROTEIN: 6.5 g/dL (ref 6.0–8.3)

## 2016-02-17 LAB — CBC WITH DIFFERENTIAL/PLATELET
BASOS ABS: 0 10*3/uL (ref 0.0–0.1)
Basophils Relative: 0.6 % (ref 0.0–3.0)
EOS ABS: 0.1 10*3/uL (ref 0.0–0.7)
EOS PCT: 2 % (ref 0.0–5.0)
HCT: 30.4 % — ABNORMAL LOW (ref 36.0–46.0)
HEMOGLOBIN: 10.1 g/dL — AB (ref 12.0–15.0)
LYMPHS ABS: 2 10*3/uL (ref 0.7–4.0)
Lymphocytes Relative: 36.5 % (ref 12.0–46.0)
MCHC: 33.3 g/dL (ref 30.0–36.0)
MCV: 98.4 fl (ref 78.0–100.0)
MONO ABS: 0.4 10*3/uL (ref 0.1–1.0)
Monocytes Relative: 8.2 % (ref 3.0–12.0)
NEUTROS PCT: 52.7 % (ref 43.0–77.0)
Neutro Abs: 2.8 10*3/uL (ref 1.4–7.7)
Platelets: 102 10*3/uL — ABNORMAL LOW (ref 150.0–400.0)
RBC: 3.09 Mil/uL — AB (ref 3.87–5.11)
RDW: 15.1 % (ref 11.5–15.5)
WBC: 5.4 10*3/uL (ref 4.0–10.5)

## 2016-02-17 LAB — LIPID PANEL
CHOL/HDL RATIO: 3
Cholesterol: 107 mg/dL (ref 0–200)
HDL: 31.3 mg/dL — AB (ref >39.00)
LDL CALC: 58 mg/dL (ref 0–99)
NonHDL: 76.06
Triglycerides: 91 mg/dL (ref 0.0–149.0)
VLDL: 18.2 mg/dL (ref 0.0–40.0)

## 2016-02-17 LAB — HEMOGLOBIN A1C: HEMOGLOBIN A1C: 5.8 % (ref 4.6–6.5)

## 2016-02-17 LAB — TSH: TSH: 3.15 u[IU]/mL (ref 0.35–4.50)

## 2016-02-24 ENCOUNTER — Ambulatory Visit (INDEPENDENT_AMBULATORY_CARE_PROVIDER_SITE_OTHER): Payer: Commercial Managed Care - HMO | Admitting: Family Medicine

## 2016-02-24 ENCOUNTER — Encounter: Payer: Self-pay | Admitting: Family Medicine

## 2016-02-24 VITALS — BP 122/64 | HR 75 | Temp 97.7°F | Ht 61.0 in | Wt 149.5 lb

## 2016-02-24 DIAGNOSIS — I10 Essential (primary) hypertension: Secondary | ICD-10-CM | POA: Diagnosis not present

## 2016-02-24 DIAGNOSIS — E119 Type 2 diabetes mellitus without complications: Secondary | ICD-10-CM

## 2016-02-24 DIAGNOSIS — E785 Hyperlipidemia, unspecified: Secondary | ICD-10-CM | POA: Diagnosis not present

## 2016-02-24 DIAGNOSIS — E039 Hypothyroidism, unspecified: Secondary | ICD-10-CM | POA: Diagnosis not present

## 2016-02-24 DIAGNOSIS — Z23 Encounter for immunization: Secondary | ICD-10-CM

## 2016-02-24 DIAGNOSIS — E1143 Type 2 diabetes mellitus with diabetic autonomic (poly)neuropathy: Secondary | ICD-10-CM | POA: Diagnosis not present

## 2016-02-24 DIAGNOSIS — D469 Myelodysplastic syndrome, unspecified: Secondary | ICD-10-CM

## 2016-02-24 MED ORDER — LOSARTAN POTASSIUM 50 MG PO TABS: 50.0000 mg | ORAL_TABLET | Freq: Every day | ORAL | 3 refills | Status: DC

## 2016-02-24 MED ORDER — PNEUMOCOCCAL 13-VAL CONJ VACC IM SUSP
0.5000 mL | Freq: Once | INTRAMUSCULAR | Status: AC
  Administered 2016-02-24: 0.5 mL via INTRAMUSCULAR

## 2016-02-24 NOTE — Assessment & Plan Note (Signed)
Now well controlled  Lab Results  Component Value Date   HGBA1C 5.8 02/17/2016   Good diet  Will ask her to hold the amaryl due to hypoglycemia Exercise as tolerated F/u 6 mo

## 2016-02-24 NOTE — Progress Notes (Signed)
Pre visit review using our clinic review tool, if applicable. No additional management support is needed unless otherwise documented below in the visit note. 

## 2016-02-24 NOTE — Assessment & Plan Note (Signed)
Hypothyroidism  Pt has no clinical changes No change in energy level/ hair or skin/ edema and no tremor Lab Results  Component Value Date   TSH 3.15 02/17/2016

## 2016-02-24 NOTE — Patient Instructions (Addendum)
Stop the [facility] (giving you a cough) and take losartan instead (will send to your pharmacy  If any side effects to that - let us know (or if you continue to have cough or voice)  Also stop the amaryl (since blood sugar is getting low at times)- you may not need it  prevnar vaccine here today  Do continue to get a flu shot every fall  Take care of yourself  Follow up in 6 months with labs prior

## 2016-02-24 NOTE — Assessment & Plan Note (Addendum)
bp in fair control at this time  BP Readings from Last 1 Encounters:  02/24/16 122/64   No changes needed Disc lifstyle change with low sodium diet and exercise  Labs reviewed  Will change trandolapril to losartan for ace cough and update

## 2016-02-24 NOTE — Assessment & Plan Note (Signed)
Hb is 10.1 For f/u heme Monday

## 2016-02-24 NOTE — Addendum Note (Signed)
Addended by: Tammi Sou on: 02/24/2016 03:43 PM   Modules accepted: Orders

## 2016-02-24 NOTE — Progress Notes (Signed)
Subjective:    Patient ID: Stacey Harris, female    DOB: 04-04-1929, 82 y.o.   MRN: [id number]  HPI Here for f/u of chronic medical problems   Wt Readings from Last 3 Encounters:  02/24/16 149 lb 8 oz (67.8 kg)  12/30/15 150 lb 4 oz (68.2 kg)  10/24/15 156 lb 3.2 oz (70.9 kg)   bmi is 28 Thinks mavik is causing a cough    bp is stable today  No cp or palpitations or headaches or edema  No side effects to medicines  BP Readings from Last 3 Encounters:  02/24/16 122/64  12/30/15 118/72  10/24/15 139/67       Chemistry      Component Value Date/Time   NA 142 02/17/2016 0949   NA 139 10/24/2015 0936   K 4.1 02/17/2016 0949   K 4.5 10/24/2015 0936   CL 108 02/17/2016 0949   CO2 26 02/17/2016 0949   CO2 23 10/24/2015 0936   BUN 27 (H) 02/17/2016 0949   BUN 18.0 10/24/2015 0936   CREATININE 1.07 02/17/2016 0949   CREATININE 1.0 10/24/2015 0936      Component Value Date/Time   CALCIUM 9.6 02/17/2016 0949   CALCIUM 9.9 10/24/2015 0936   ALKPHOS 52 02/17/2016 0949   ALKPHOS 52 10/24/2015 0936   AST 36 02/17/2016 0949   AST 39 (H) 10/24/2015 0936   ALT 21 02/17/2016 0949   ALT 22 10/24/2015 0936   BILITOT 0.8 02/17/2016 0949   BILITOT 0.83 10/24/2015 0936     is trying to drink more water - best effort for 4  16 oz servings per day   Lab Results  Component Value Date   WBC 5.4 02/17/2016   HGB 10.1 (L) 02/17/2016   HCT 30.4 (L) 02/17/2016   MCV 98.4 02/17/2016   PLT 102.0 (L) 02/17/2016   fairly stable -sees heme monday  Hx of hyperlipidemia Lab Results  Component Value Date   CHOL 107 02/17/2016   HDL 31.30 (L) 02/17/2016   LDLCALC 58 02/17/2016   LDLDIRECT 202.7 08/17/2010   TRIG 91.0 02/17/2016   CHOLHDL 3 02/17/2016   On crestor and diet  Exercise is limited   DM2 Lab Results  Component Value Date   HGBA1C 5.8 02/17/2016   Very well controlled  On ace for renal protection - mavik  On metformin  Chose to continue amaryl - a few spells of  low blood sugar /is ok with holding it    Hypothyroidism  Pt has no clinical changes No change in energy level/ hair or skin/ edema and no tremor Lab Results  Component Value Date   TSH 3.15 02/17/2016       Patient Active Problem List   Diagnosis Date Noted  . Controlled type 2 diabetes mellitus with diabetic autonomic neuropathy ([place]) 02/24/2016  . Routine general medical examination at a health care facility 02/12/2016  . MDS (myelodysplastic syndrome) ([place]) 10/24/2015  . Normocytic anemia 02/11/2015  . GERD (gastroesophageal reflux disease) 08/06/2014  . Encounter for Medicare annual wellness exam 08/03/2013  . Colon cancer screening 08/03/2013  . Thrombocytopenia ([place]) 08/03/2013  . Insomnia 01/26/2013  . Bee sting reaction 01/26/2013  . Hypothyroid 09/17/2011  . Lumbar spinal stenosis 06/21/2011    Class: Present on Admission  . Anemia, mild 05/25/2011  . BACK PAIN, LUMBAR 08/23/2010  . POSTNASAL DRIP 08/23/2010  . OSTEOARTHRITIS, HIP 03/16/2009  . MENOPAUSAL SYNDROME 12/08/2008  . CORNS AND CALLOSITIES 08/13/2007  .  BASAL CELL CARCINOMA, FACE 07/01/2007  . BASAL CELL CARCINOMA, FACE 07/01/2007  . DIABETIC PERIPHERAL NEUROPATHY 09/24/2006  . Hyperlipidemia 09/24/2006  . Obesity 09/24/2006  . Essential hypertension 09/24/2006  . ROSACEA 09/24/2006  . INCONTINENCE, URGE 09/24/2006  . COLONOSCOPY AND REMOVAL OF LESION, HX OF 06/09/2003   Past Medical History:  Diagnosis Date  . Blood transfusion   . Cancer ([place])    basal cell skin CA  . Diabetes mellitus    type II  . Hyperlipidemia   . Hypertension   . Hypothyroidism   . OA (osteoarthritis)    Past Surgical History:  Procedure Laterality Date  . ANKLE FRACTURE SURGERY  10   rt  . JOINT REPLACEMENT     rt total hip 10  . LUMBAR LAMINECTOMY/DECOMPRESSION MICRODISCECTOMY  06/22/2011   Procedure: LUMBAR LAMINECTOMY/DECOMPRESSION MICRODISCECTOMY;  Surgeon: Marybelle Killings;  Location: [facility];  Service:  Orthopedics;  Laterality: N/A;  L3-4, L4-5 Decompression  . MOHS SURGERY  12/08   basal cell skin cancer lt temple  . TAH      and BSO  . TONSILLECTOMY     Social History  Substance Use Topics  . Smoking status: Never Smoker  . Smokeless tobacco: Never Used  . Alcohol use No   Family History  Problem Relation Age of Onset  . Arthritis Father     RA  . Hypertension Father   . Heart disease Father     CAD  . Diabetes Father   . Arthritis Sister     RA  . Diabetes Brother   . Diabetes Brother   . Diabetes Brother    Allergies  Allergen Reactions  . Mavik [Trandolapril]     Cough    Current Outpatient Prescriptions on File Prior to Visit  Medication Sig Dispense Refill  . ACCU-CHEK FASTCLIX LANCETS MISC Check blood sugar once daily and as directed.Dx 250.00 102 each 3  . aspirin 81 MG tablet Take 81 mg by mouth daily.      . Blood Glucose Calibration (ACCU-CHEK SMARTVIEW CONTROL) LIQD     . Blood Glucose Monitoring Suppl (ACCU-CHEK AVIVA PLUS) W/DEVICE KIT Check blood sugar once daily and as directed.Dx 250.00 1 kit 0  . diazepam (VALIUM) 5 MG tablet Take 0.5 tablets (2.5 mg total) by mouth at bedtime as needed. 30 tablet 0  . glucose blood (ACCU-CHEK AVIVA PLUS) test strip Check blood sugar once daily and as directed.Dx 250.00 100 each 3  . hydrochlorothiazide (HYDRODIURIL) 25 MG tablet TAKE 1 TABLET BY MOUTH EVERY MORNING 30 tablet 5  . levothyroxine (SYNTHROID, LEVOTHROID) 50 MCG tablet Take 1 tablet (50 mcg total) by mouth daily. 90 tablet 3  . metFORMIN (GLUCOPHAGE) 1000 MG tablet TAKE 1 TABLET BY MOUTH 2 TIMES DAILY WITH A MEAL. 180 tablet 3  . metoprolol succinate (TOPROL-XL) 100 MG 24 hr tablet TAKE 1 TABLET BY MOUTH DAILY 30 tablet 5  . Multiple Vitamin (MULTIVITAMIN) capsule Take 1 capsule by mouth daily.    . Omega-3 Fatty Acids (FISH OIL) 1200 MG CAPS Take 1,200 mg by mouth 2 (two) times daily.      . rosuvastatin (CRESTOR) 10 MG tablet TAKE 1 TABLET EVERY DAY 90  tablet 0   No current facility-administered medications on file prior to visit.     Review of Systems    Review of Systems  Constitutional: Negative for fever, appetite change,  and unexpected weight change. pos for fatigue with exercise  Eyes: Negative for pain  and visual disturbance.  Respiratory: Negative for cough and shortness of breath.   Cardiovascular: Negative for cp or palpitations    Gastrointestinal: Negative for nausea, diarrhea and constipation.  Genitourinary: Negative for urgency and frequency.  Skin: Negative for pallor or rash   Neurological: Negative for weakness, light-headedness, numbness and headaches.  Hematological: Negative for adenopathy. Does not bruise/bleed easily.  Psychiatric/Behavioral: Negative for dysphoric mood. The patient is not nervous/anxious.      Objective:   Physical Exam  Constitutional: She appears well-developed and well-nourished. No distress.  overwt and well appearing   HENT:  Head: Normocephalic and atraumatic.  Mouth/Throat: Oropharynx is clear and moist.  Eyes: Conjunctivae and EOM are normal. Pupils are equal, round, and reactive to light.  Neck: Normal range of motion. Neck supple. No JVD present. Carotid bruit is not present. No thyromegaly present.  Cardiovascular: Normal rate, regular rhythm, normal heart sounds and intact distal pulses.  Exam reveals no gallop.   Pulmonary/Chest: Effort normal and breath sounds normal. No respiratory distress. She has no wheezes. She has no rales.  No crackles  Abdominal: Soft. Bowel sounds are normal. She exhibits no distension, no abdominal bruit and no mass. There is no tenderness.  Musculoskeletal: She exhibits no edema.  Lymphadenopathy:    She has no cervical adenopathy.  Neurological: She is alert. She has normal reflexes.  Skin: Skin is warm and dry. No rash noted.  Psychiatric: She has a normal mood and affect.          Assessment & Plan:   Problem List Items Addressed  This Visit      Cardiovascular and Mediastinum   Essential hypertension - Primary    bp in fair control at this time  BP Readings from Last 1 Encounters:  02/24/16 122/64   No changes needed Disc lifstyle change with low sodium diet and exercise  Labs reviewed  Will change trandolapril to losartan for ace cough and update      Relevant Medications   losartan (COZAAR) 50 MG tablet     Endocrine   Hypothyroid    Hypothyroidism  Pt has no clinical changes No change in energy level/ hair or skin/ edema and no tremor Lab Results  Component Value Date   TSH 3.15 02/17/2016           Controlled type 2 diabetes mellitus with diabetic autonomic neuropathy ([place])    Now well controlled  Lab Results  Component Value Date   HGBA1C 5.8 02/17/2016   Good diet  Will ask her to hold the amaryl due to hypoglycemia Exercise as tolerated F/u 6 mo       Relevant Medications   losartan (COZAAR) 50 MG tablet     Other   MDS (myelodysplastic syndrome) ([facility])    Hb is 10.1 For f/u heme Monday       Hyperlipidemia    Disc goals for lipids and reasons to control them Rev labs with pt Rev low sat fat diet in detail HDL low but exercise is limited  Continue rosuvastatin and diet       Relevant Medications   losartan (COZAAR) 50 MG tablet    Other Visit Diagnoses    Controlled type 2 diabetes mellitus without complication, without long-term current use of insulin (HCC)       Relevant Medications   losartan (COZAAR) 50 MG tablet

## 2016-02-24 NOTE — Assessment & Plan Note (Signed)
Disc goals for lipids and reasons to control them Rev labs with pt Rev low sat fat diet in detail HDL low but exercise is limited  Continue rosuvastatin and diet

## 2016-02-27 ENCOUNTER — Other Ambulatory Visit: Payer: Commercial Managed Care - HMO

## 2016-02-27 ENCOUNTER — Other Ambulatory Visit (HOSPITAL_BASED_OUTPATIENT_CLINIC_OR_DEPARTMENT_OTHER): Payer: Commercial Managed Care - HMO

## 2016-02-27 ENCOUNTER — Ambulatory Visit: Payer: Commercial Managed Care - HMO | Admitting: Hematology

## 2016-02-27 DIAGNOSIS — D649 Anemia, unspecified: Secondary | ICD-10-CM

## 2016-02-27 DIAGNOSIS — D696 Thrombocytopenia, unspecified: Secondary | ICD-10-CM | POA: Diagnosis not present

## 2016-02-27 DIAGNOSIS — D469 Myelodysplastic syndrome, unspecified: Secondary | ICD-10-CM

## 2016-02-27 LAB — COMPREHENSIVE METABOLIC PANEL
ALBUMIN: 3.2 g/dL — AB (ref 3.5–5.0)
ALK PHOS: 78 U/L (ref 40–150)
ALT: 34 U/L (ref 0–55)
AST: 54 U/L — AB (ref 5–34)
Anion Gap: 9 mEq/L (ref 3–11)
BUN: 32.3 mg/dL — AB (ref 7.0–26.0)
CALCIUM: 10 mg/dL (ref 8.4–10.4)
CO2: 24 mEq/L (ref 22–29)
CREATININE: 1.1 mg/dL (ref 0.6–1.1)
Chloride: 110 mEq/L — ABNORMAL HIGH (ref 98–109)
EGFR: 47 mL/min/{1.73_m2} — ABNORMAL LOW (ref >90)
GLUCOSE: 115 mg/dL (ref 70–140)
Potassium: 4.1 mEq/L (ref 3.5–5.1)
SODIUM: 144 meq/L (ref 136–145)
Total Bilirubin: 0.76 mg/dL (ref 0.20–1.20)
Total Protein: 6.6 g/dL (ref 6.4–8.3)

## 2016-02-27 LAB — CBC & DIFF AND RETIC
BASO%: 0.9 % (ref 0.0–2.0)
BASOS ABS: 0.1 10*3/uL (ref 0.0–0.1)
EOS ABS: 0.1 10*3/uL (ref 0.0–0.5)
EOS%: 2.5 % (ref 0.0–7.0)
HEMATOCRIT: 30.1 % — AB (ref 34.8–46.6)
HEMOGLOBIN: 10 g/dL — AB (ref 11.6–15.9)
IMMATURE RETIC FRACT: 14.9 % — AB (ref 1.60–10.00)
LYMPH#: 1.7 10*3/uL (ref 0.9–3.3)
LYMPH%: 32.5 % (ref 14.0–49.7)
MCH: 32.5 pg (ref 25.1–34.0)
MCHC: 33.2 g/dL (ref 31.5–36.0)
MCV: 97.7 fL (ref 79.5–101.0)
MONO#: 0.5 10*3/uL (ref 0.1–0.9)
MONO%: 9.6 % (ref 0.0–14.0)
NEUT#: 2.9 10*3/uL (ref 1.5–6.5)
NEUT%: 54.5 % (ref 38.4–76.8)
PLATELETS: 102 10*3/uL — AB (ref 145–400)
RBC: 3.08 10*6/uL — ABNORMAL LOW (ref 3.70–5.45)
RDW: 15 % — ABNORMAL HIGH (ref 11.2–14.5)
RETIC %: 1.78 % (ref 0.70–2.10)
RETIC CT ABS: 54.82 10*3/uL (ref 33.70–90.70)
WBC: 5.3 10*3/uL (ref 3.9–10.3)

## 2016-02-27 LAB — FERRITIN: Ferritin: 87 ng/ml (ref 9–269)

## 2016-02-28 LAB — ERYTHROPOIETIN: Erythropoietin: 34.7 m[IU]/mL — ABNORMAL HIGH (ref 2.6–18.5)

## 2016-03-05 ENCOUNTER — Encounter: Payer: Self-pay | Admitting: Hematology

## 2016-03-05 ENCOUNTER — Other Ambulatory Visit: Payer: Commercial Managed Care - HMO

## 2016-03-05 ENCOUNTER — Telehealth: Payer: Self-pay | Admitting: Hematology

## 2016-03-05 ENCOUNTER — Ambulatory Visit (HOSPITAL_BASED_OUTPATIENT_CLINIC_OR_DEPARTMENT_OTHER): Payer: Commercial Managed Care - HMO | Admitting: Hematology

## 2016-03-05 VITALS — BP 155/66 | HR 56 | Temp 98.0°F | Resp 18 | Wt 151.1 lb

## 2016-03-05 DIAGNOSIS — D649 Anemia, unspecified: Secondary | ICD-10-CM | POA: Diagnosis not present

## 2016-03-05 DIAGNOSIS — D696 Thrombocytopenia, unspecified: Secondary | ICD-10-CM

## 2016-03-05 DIAGNOSIS — D469 Myelodysplastic syndrome, unspecified: Secondary | ICD-10-CM

## 2016-03-05 NOTE — Telephone Encounter (Signed)
Gave patient avs report and appointments for February.  °

## 2016-03-05 NOTE — Progress Notes (Signed)
[name]  HEMATOLOGY ONCOLOGY PROGRESS NOTE  Date of service: 03/05/2016  Patient Care Team: [name], MD as PCP - General Monna Fam, MD as Consulting Physician (Ophthalmology)  Diagnosis: Chronic anemia and thrombocytopenia likely related to MDS  Current Treatment:  -current monitoring of Blood counts -would consider BM Bx +/- ESA/Iron if worsening anemia  INTERVAL HISTORY:  Patient notes she is doing about the same and notes no acute new concerns since her last visit. No issues with bleeding or excessive bruising. Energy levels are about the same. CBC appears stable over the last 4 months but no acute changes.   REVIEW OF SYSTEMS:    10 Point review of systems of done and is negative except as noted above.  . Past Medical History:  Diagnosis Date  . Blood transfusion   . Cancer ([place])    basal cell skin CA  . Diabetes mellitus    type II  . Hyperlipidemia   . Hypertension   . Hypothyroidism   . OA (osteoarthritis)     . Past Surgical History:  Procedure Laterality Date  . ANKLE FRACTURE SURGERY  10   rt  . JOINT REPLACEMENT     rt total hip 10  . LUMBAR LAMINECTOMY/DECOMPRESSION MICRODISCECTOMY  06/22/2011   Procedure: LUMBAR LAMINECTOMY/DECOMPRESSION MICRODISCECTOMY;  Surgeon: Marybelle Killings;  Location: [facility];  Service: Orthopedics;  Laterality: N/A;  L3-4, L4-5 Decompression  . MOHS SURGERY  12/08   basal cell skin cancer lt temple  . TAH      and BSO  . TONSILLECTOMY      . Social History  Substance Use Topics  . Smoking status: Never Smoker  . Smokeless tobacco: Never Used  . Alcohol use No    ALLERGIES:  is allergic to [facility] [trandolapril].  MEDICATIONS:  Current Outpatient Prescriptions  Medication Sig Dispense Refill  . ACCU-CHEK FASTCLIX LANCETS MISC Check blood sugar once daily and as directed.Dx 250.00 102 each 3  . aspirin 81 MG tablet Take 81 mg by mouth daily.      . Blood Glucose Calibration (ACCU-CHEK SMARTVIEW CONTROL) LIQD     .  Blood Glucose Monitoring Suppl (ACCU-CHEK AVIVA PLUS) W/DEVICE KIT Check blood sugar once daily and as directed.Dx 250.00 1 kit 0  . diazepam (VALIUM) 5 MG tablet Take 0.5 tablets (2.5 mg total) by mouth at bedtime as needed. 30 tablet 0  . glucose blood (ACCU-CHEK AVIVA PLUS) test strip Check blood sugar once daily and as directed.Dx 250.00 100 each 3  . hydrochlorothiazide (HYDRODIURIL) 25 MG tablet TAKE 1 TABLET BY MOUTH EVERY MORNING 30 tablet 5  . levothyroxine (SYNTHROID, LEVOTHROID) 50 MCG tablet Take 1 tablet (50 mcg total) by mouth daily. 90 tablet 3  . losartan (COZAAR) 50 MG tablet Take 1 tablet (50 mg total) by mouth daily. 90 tablet 3  . metFORMIN (GLUCOPHAGE) 1000 MG tablet TAKE 1 TABLET BY MOUTH 2 TIMES DAILY WITH A MEAL. 180 tablet 3  . metoprolol succinate (TOPROL-XL) 100 MG 24 hr tablet TAKE 1 TABLET BY MOUTH DAILY 30 tablet 5  . Multiple Vitamin (MULTIVITAMIN) capsule Take 1 capsule by mouth daily.    . Omega-3 Fatty Acids (FISH OIL) 1200 MG CAPS Take 1,200 mg by mouth 2 (two) times daily.      . rosuvastatin (CRESTOR) 10 MG tablet TAKE 1 TABLET EVERY DAY 90 tablet 0   No current facility-administered medications for this visit.     PHYSICAL EXAMINATION: ECOG PERFORMANCE STATUS: 1 -  Symptomatic but completely ambulatory  . Vitals:   03/05/16 0943  BP: (!) 155/66  Pulse: (!) 56  Resp: 18  Temp: 98 F (36.7 C)    Filed Weights   03/05/16 0943  Weight: 151 lb 1.6 oz (68.5 kg)   .Body mass index is 28.55 kg/m.  GENERAL:alert, in no acute distress and comfortable SKIN: skin color, texture, turgor are normal, no rashes or significant lesions EYES: normal, conjunctiva are pink and non-injected, sclera clear OROPHARYNX:no exudate, no erythema and lips, buccal mucosa, and tongue normal  NECK: supple, no JVD, thyroid normal size, non-tender, without nodularity LYMPH: no palpable lymphadenopathy in the cervical, axillary or inguinal LUNGS: clear to auscultation with  normal respiratory effort HEART: regular rate & rhythm, no murmurs and no lower extremity edema ABDOMEN: abdomen soft, non-tender, normoactive bowel sounds  Musculoskeletal: no cyanosis of digits and no clubbing  PSYCH: alert & oriented x 3 with fluent speech NEURO: no focal motor/sensory deficits  LABORATORY DATA:   . CBC Latest Ref Rng & Units 02/27/2016 02/17/2016 10/24/2015  WBC 3.9 - 10.3 10e3/uL 5.3 5.4 5.4  Hemoglobin 11.6 - 15.9 g/dL 10.0(L) 10.1(L) 10.7(L)  Hematocrit 34.8 - 46.6 % 30.1(L) 30.4(L) 31.4(L)  Platelets 145 - 400 10e3/uL 102(L) 102.0(L) 116(L)   . CMP Latest Ref Rng & Units 02/27/2016 02/17/2016 11/17/2015  Glucose 70 - 140 mg/dl 115 92 92  BUN 7.0 - 26.0 mg/dL 32.3(H) 27(H) 29(H)  Creatinine 0.6 - 1.1 mg/dL 1.1 1.07 1.10  Sodium 136 - 145 mEq/L 144 142 135  Potassium 3.5 - 5.1 mEq/L 4.1 4.1 4.3  Chloride 96 - 112 mEq/L - 108 100  CO2 22 - 29 mEq/L _0 Calcium 8.4 - 10.4 mg/dL 10.0 9.6 10.0  Total Protein 6.4 - 8.3 g/dL 6.6 6.5 -  Total Bilirubin 0.20 - 1.20 mg/dL 0.76 0.8 -  Alkaline Phos 40 - 150 U/L 78 52 -  AST 5 - 34 U/L 54(H) 36 -  ALT 0 - 55 U/L 34 21 -   Component     Latest Ref Rng & Units 02/17/2016 02/27/2016  TSH     0.35 - 4.50 uIU/mL 3.15   Erythropoietin     2.6 - 18.5 mIU/mL  34.7 (H)  Ferritin     9 - 269 ng/ml  87    I have reviewed the data as listed  RADIOGRAPHIC STUDIES: I have personally reviewed the radiological images as listed and agreed with the findings in the report. No results found.  ASSESSMENT & PLAN:   Stacey Harris is a very sweet 82 year old female with  1) Chronic anemia. Normocytic but with high normal MCV levels. No evidence of hemolysis on peripheral blood smear or labs. Peripheral blood smear shows macrocytic RBC population. Normal B12. No history of alcohol. Hypothyroidism is well treated. Presence of large platelets and dysplastic WBCs in addition to macrocytic RBCs is consistent with the possibility  of myelodysplastic syndrome. Hemoglobin is stable at 10. Ferritin is stable at 87 Recent TSH is within normal limits Erythropoietin 34.7-low enough that it suggests she might be a responder to ESA's if needed.  2) mild Thrombocytopenia -the counts are being underestimated at least partly due to platelet clumping on the smear as noted above. Platelets are stable at 102k It is also partly due to her presentation of myelodysplastic syndrome. Plan -Patient's anemia and some cytopenia is relatively stable at this time. -No issues with worsening fatigue infections or bleeding. -No indication for bone  marrow biopsy at this time unless her counts significantly worsen  -No indication for the use of ESA's at this time -iron at goal of ferritin >50 currently  -might need additional iron  to maintain ferritin >100 if patient more anemic and ESA's use contemplated. -Continue daily multivitamin  -Return to care with Dr. Irene Limbo in 6 months with repeat blood counts CBC, CMP, and ferritin levels. Earlier if any other acute concerns .  [name] No orders of the defined types were placed in this encounter.   I spent 15 minutes counseling the patient face to face. The total time spent in the appointment was 20 minutes and more than 50% was on counseling and direct patient cares.    Sullivan Lone MD [place] AAHIVMS [facility] Hematology/Oncology Physician [facility]  (Office):       [phone] (Work cell):  [phone] (Fax):           [phone]

## 2016-03-23 ENCOUNTER — Other Ambulatory Visit: Payer: Self-pay | Admitting: Family Medicine

## 2016-04-24 ENCOUNTER — Other Ambulatory Visit: Payer: Self-pay | Admitting: Family Medicine

## 2016-04-26 ENCOUNTER — Telehealth: Payer: Self-pay | Admitting: Family Medicine

## 2016-04-26 MED ORDER — ROSUVASTATIN CALCIUM 10 MG PO TABS: 10.0000 mg | ORAL_TABLET | Freq: Every day | ORAL | 1 refills | Status: DC

## 2016-04-26 NOTE — Telephone Encounter (Signed)
Pt called to request a refill of her Crestor.  Can you please contact her to let her know if and when this is called in.  She can be reached at [phone]

## 2016-05-24 ENCOUNTER — Other Ambulatory Visit: Payer: Self-pay | Admitting: Family Medicine

## 2016-07-23 ENCOUNTER — Other Ambulatory Visit: Payer: Self-pay | Admitting: Family Medicine

## 2016-07-24 NOTE — Telephone Encounter (Signed)
done

## 2016-07-24 NOTE — Telephone Encounter (Signed)
Not on med list and it says med was d/c, please advise

## 2016-07-24 NOTE — Telephone Encounter (Signed)
She actually chose to continue it while watching closely for low blood glucose Please refill for 6 mo  Thanks

## 2016-08-02 DIAGNOSIS — H2513 Age-related nuclear cataract, bilateral: Secondary | ICD-10-CM | POA: Diagnosis not present

## 2016-08-02 DIAGNOSIS — H25013 Cortical age-related cataract, bilateral: Secondary | ICD-10-CM | POA: Diagnosis not present

## 2016-08-02 DIAGNOSIS — E119 Type 2 diabetes mellitus without complications: Secondary | ICD-10-CM | POA: Diagnosis not present

## 2016-08-13 LAB — HM DIABETES EYE EXAM

## 2016-08-20 ENCOUNTER — Other Ambulatory Visit (INDEPENDENT_AMBULATORY_CARE_PROVIDER_SITE_OTHER): Payer: Medicare HMO

## 2016-08-20 DIAGNOSIS — E119 Type 2 diabetes mellitus without complications: Secondary | ICD-10-CM | POA: Diagnosis not present

## 2016-08-20 DIAGNOSIS — I10 Essential (primary) hypertension: Secondary | ICD-10-CM

## 2016-08-20 DIAGNOSIS — E785 Hyperlipidemia, unspecified: Secondary | ICD-10-CM

## 2016-08-20 LAB — COMPREHENSIVE METABOLIC PANEL
ALBUMIN: 3.4 g/dL — AB (ref 3.5–5.2)
ALT: 20 U/L (ref 0–35)
AST: 34 U/L (ref 0–37)
Alkaline Phosphatase: 66 U/L (ref 39–117)
BUN: 32 mg/dL — AB (ref 6–23)
CHLORIDE: 111 meq/L (ref 96–112)
CO2: 25 mEq/L (ref 19–32)
CREATININE: 1.04 mg/dL (ref 0.40–1.20)
Calcium: 9.3 mg/dL (ref 8.4–10.5)
GFR: 53.49 mL/min — ABNORMAL LOW (ref >60.00)
GLUCOSE: 102 mg/dL — AB (ref 70–99)
Potassium: 3.9 mEq/L (ref 3.5–5.1)
SODIUM: 143 meq/L (ref 135–145)
Total Bilirubin: 0.7 mg/dL (ref 0.2–1.2)
Total Protein: 6.4 g/dL (ref 6.0–8.3)

## 2016-08-20 LAB — HEMOGLOBIN A1C: HEMOGLOBIN A1C: 6.6 % — AB (ref 4.6–6.5)

## 2016-08-20 LAB — LIPID PANEL
CHOL/HDL RATIO: 3
Cholesterol: 101 mg/dL (ref 0–200)
HDL: 35.9 mg/dL — ABNORMAL LOW (ref >39.00)
LDL CALC: 52 mg/dL (ref 0–99)
NONHDL: 65.5
Triglycerides: 66 mg/dL (ref 0.0–149.0)
VLDL: 13.2 mg/dL (ref 0.0–40.0)

## 2016-08-27 ENCOUNTER — Ambulatory Visit (INDEPENDENT_AMBULATORY_CARE_PROVIDER_SITE_OTHER): Payer: Medicare HMO | Admitting: Family Medicine

## 2016-08-27 ENCOUNTER — Encounter: Payer: Self-pay | Admitting: Family Medicine

## 2016-08-27 VITALS — BP 130/70 | HR 98 | Temp 97.6°F | Ht 61.0 in | Wt 153.5 lb

## 2016-08-27 DIAGNOSIS — E7800 Pure hypercholesterolemia, unspecified: Secondary | ICD-10-CM

## 2016-08-27 DIAGNOSIS — D696 Thrombocytopenia, unspecified: Secondary | ICD-10-CM

## 2016-08-27 DIAGNOSIS — Z9181 History of falling: Secondary | ICD-10-CM | POA: Diagnosis not present

## 2016-08-27 DIAGNOSIS — D469 Myelodysplastic syndrome, unspecified: Secondary | ICD-10-CM

## 2016-08-27 DIAGNOSIS — E1143 Type 2 diabetes mellitus with diabetic autonomic (poly)neuropathy: Secondary | ICD-10-CM | POA: Diagnosis not present

## 2016-08-27 DIAGNOSIS — I10 Essential (primary) hypertension: Secondary | ICD-10-CM

## 2016-08-27 DIAGNOSIS — D649 Anemia, unspecified: Secondary | ICD-10-CM

## 2016-08-27 NOTE — Assessment & Plan Note (Signed)
bp in fair control at this time  BP Readings from Last 1 Encounters:  08/27/16 130/70   No changes needed Disc lifstyle change with low sodium diet and exercise  Labs reviewed F/u 6 mo

## 2016-08-27 NOTE — Assessment & Plan Note (Signed)
Controlled with crestor and diet Disc goals for lipids and reasons to control them Rev labs with pt Rev low sat fat diet in detail  HDL will go up with more activity hopefully in better weather

## 2016-08-27 NOTE — Assessment & Plan Note (Signed)
Seeing hematology  No symptoms except for cold intolerance

## 2016-08-27 NOTE — Assessment & Plan Note (Signed)
Pt now sees hematology for myelodysplastic syndrome

## 2016-08-27 NOTE — Progress Notes (Signed)
Subjective:    Patient ID: Stacey Harris, female    DOB: [birth year], 83 y.o.   MRN: [id number]  HPI Here for f/u of chronic health problems   Doing ok overall  Had a fall Nov 3 - hit her face -took a long time to get better - she cut head and would not call anyone to take her to the hospital  Stumbled on a step (only has 2 steps in the house)  Put a railing up now   occ has a headache at night  Not all day  Not getting worse   Has rails in bathroom also She is being much more careful   Wt Readings from Last 3 Encounters:  08/27/16 153 lb 8 oz (69.6 kg)  03/05/16 151 lb 1.6 oz (68.5 kg)  02/24/16 149 lb 8 oz (67.8 kg)  wt is up 2 lb  bmi is 29  bp is stable today  No cp or palpitations or headaches or edema  No side effects to medicines  BP Readings from Last 3 Encounters:  08/27/16 130/70  03/05/16 (!) 155/66  02/24/16 122/64     Diabetes Home sugar results -no hypoglycemia , mostly 110s to 120s  DM diet -eating different/ a lot of soup / she has a taste for it  Some hot chocolate  1/2 banana once daily  Roasts veg in the oven Exercise -not much (works in the house however)  Symptoms-hx of neuropathy A1C last  Lab Results  Component Value Date   HGBA1C 6.6 (H) 08/20/2016  this is up from 5.8  No problems with medications  Renal protection on arb Last eye exam  1/17- just went for that 2 wk ago   Hx of hyperlipidemia  Lab Results  Component Value Date   CHOL 101 08/20/2016   CHOL 107 02/17/2016   CHOL 99 08/12/2015   Lab Results  Component Value Date   HDL 35.90 (L) 08/20/2016   HDL 31.30 (L) 02/17/2016   HDL 31.80 (L) 08/12/2015   Lab Results  Component Value Date   LDLCALC 52 08/20/2016   LDLCALC 58 02/17/2016   LDLCALC 47 08/12/2015   Lab Results  Component Value Date   TRIG 66.0 08/20/2016   TRIG 91.0 02/17/2016   TRIG 101.0 08/12/2015   Lab Results  Component Value Date   CHOLHDL 3 08/20/2016   CHOLHDL 3 02/17/2016   CHOLHDL 3  08/12/2015   Lab Results  Component Value Date   LDLDIRECT 202.7 08/17/2010   On crestor and diet  Pretty well controlled  With more exercise- HDL will do up   Patient Active Problem List   Diagnosis Date Noted  . History of fall 08/27/2016  . Controlled type 2 diabetes mellitus with diabetic autonomic neuropathy ([place]) 02/24/2016  . Routine general medical examination at a health care facility 02/12/2016  . MDS (myelodysplastic syndrome) ([place]) 10/24/2015  . Normocytic anemia 02/11/2015  . GERD (gastroesophageal reflux disease) 08/06/2014  . Encounter for Medicare annual wellness exam 08/03/2013  . Colon cancer screening 08/03/2013  . Thrombocytopenia ([place]) 08/03/2013  . Insomnia 01/26/2013  . Hypothyroid 09/17/2011  . Lumbar spinal stenosis 06/21/2011    Class: Present on Admission  . BACK PAIN, LUMBAR 08/23/2010  . POSTNASAL DRIP 08/23/2010  . OSTEOARTHRITIS, HIP 03/16/2009  . MENOPAUSAL SYNDROME 12/08/2008  . CORNS AND CALLOSITIES 08/13/2007  . BASAL CELL CARCINOMA, FACE 07/01/2007  . BASAL CELL CARCINOMA, FACE 07/01/2007  . DIABETIC PERIPHERAL NEUROPATHY 09/24/2006  .  Hyperlipidemia 09/24/2006  . Essential hypertension 09/24/2006  . ROSACEA 09/24/2006  . INCONTINENCE, URGE 09/24/2006  . COLONOSCOPY AND REMOVAL OF LESION, HX OF 06/09/2003   Past Medical History:  Diagnosis Date  . Blood transfusion   . Cancer ([place])    basal cell skin CA  . Diabetes mellitus    type II  . Hyperlipidemia   . Hypertension   . Hypothyroidism   . OA (osteoarthritis)    Past Surgical History:  Procedure Laterality Date  . ANKLE FRACTURE SURGERY  10   rt  . JOINT REPLACEMENT     rt total hip 10  . LUMBAR LAMINECTOMY/DECOMPRESSION MICRODISCECTOMY  06/22/2011   Procedure: LUMBAR LAMINECTOMY/DECOMPRESSION MICRODISCECTOMY;  Surgeon: Marybelle Killings;  Location: [facility];  Service: Orthopedics;  Laterality: N/A;  L3-4, L4-5 Decompression  . MOHS SURGERY  12/08   basal cell skin cancer lt  temple  . TAH      and BSO  . TONSILLECTOMY     Social History  Substance Use Topics  . Smoking status: Never Smoker  . Smokeless tobacco: Never Used  . Alcohol use No   Family History  Problem Relation Age of Onset  . Arthritis Father     RA  . Hypertension Father   . Heart disease Father     CAD  . Diabetes Father   . Arthritis Sister     RA  . Diabetes Brother   . Diabetes Brother   . Diabetes Brother    Allergies  Allergen Reactions  . Mavik [Trandolapril]     Cough    Current Outpatient Prescriptions on File Prior to Visit  Medication Sig Dispense Refill  . ACCU-CHEK FASTCLIX LANCETS MISC Check blood sugar once daily and as directed.Dx 250.00 102 each 3  . aspirin 81 MG tablet Take 81 mg by mouth daily.      . Blood Glucose Calibration (ACCU-CHEK SMARTVIEW CONTROL) LIQD     . Blood Glucose Monitoring Suppl (ACCU-CHEK AVIVA PLUS) W/DEVICE KIT Check blood sugar once daily and as directed.Dx 250.00 1 kit 0  . diazepam (VALIUM) 5 MG tablet Take 0.5 tablets (2.5 mg total) by mouth at bedtime as needed. 30 tablet 0  . glimepiride (AMARYL) 4 MG tablet TAKE 1 TABLET   DAILY BEFORE BREAKFAST. 90 tablet 1  . glucose blood (ACCU-CHEK AVIVA PLUS) test strip Check blood sugar once daily and as directed.Dx 250.00 100 each 3  . hydrochlorothiazide (HYDRODIURIL) 25 MG tablet TAKE 1 TABLET BY MOUTH EVERY MORNING 30 tablet 5  . levothyroxine (SYNTHROID, LEVOTHROID) 50 MCG tablet TAKE 1 TABLET EVERY DAY 90 tablet 0  . losartan (COZAAR) 50 MG tablet Take 1 tablet (50 mg total) by mouth daily. 90 tablet 3  . metFORMIN (GLUCOPHAGE) 1000 MG tablet TAKE 1 TABLET TWICE DAILY WITH MEALS 180 tablet 0  . metoprolol succinate (TOPROL-XL) 100 MG 24 hr tablet TAKE 1 TABLET BY MOUTH DAILY 30 tablet 5  . Multiple Vitamin (MULTIVITAMIN) capsule Take 1 capsule by mouth daily.    . Omega-3 Fatty Acids (FISH OIL) 1200 MG CAPS Take 1,200 mg by mouth 2 (two) times daily.      . rosuvastatin (CRESTOR) 10  MG tablet Take 1 tablet (10 mg total) by mouth daily. 90 tablet 1   No current facility-administered medications on file prior to visit.      Review of Systems Review of Systems  Constitutional: Negative for fever, appetite change, fatigue and unexpected weight change.  Eyes: Negative  for pain and visual disturbance.  Respiratory: Negative for cough and shortness of breath.   Cardiovascular: Negative for cp or palpitations    Gastrointestinal: Negative for nausea, diarrhea and constipation.  Genitourinary: Negative for urgency and frequency.  Skin: Negative for pallor or rash   MSK pos for chronic hip and back pain  Neurological: Negative for weakness, light-headedness, numbness and pos for occ ha at night -not often   Hematological: Negative for adenopathy. Does not bruise/bleed easily.  Psychiatric/Behavioral: Negative for dysphoric mood. The patient is not nervous/anxious.         Objective:   Physical Exam  Constitutional: She appears well-developed and well-nourished. No distress.  overwt well appearing elderly female  HENT:  Head: Normocephalic and atraumatic.  Mouth/Throat: Oropharynx is clear and moist.  Eyes: Conjunctivae and EOM are normal. Pupils are equal, round, and reactive to light.  Neck: Normal range of motion. Neck supple. No JVD present. Carotid bruit is not present. No thyromegaly present.  Cardiovascular: Normal rate, regular rhythm, normal heart sounds and intact distal pulses.  Exam reveals no gallop.   Pulmonary/Chest: Effort normal and breath sounds normal. No respiratory distress. She has no wheezes. She has no rales.  No crackles  Abdominal: Soft. Bowel sounds are normal. She exhibits no distension, no abdominal bruit and no mass. There is no tenderness.  Musculoskeletal: She exhibits no edema.  Lymphadenopathy:    She has no cervical adenopathy.  Neurological: She is alert. She has normal reflexes. No cranial nerve deficit. She exhibits normal muscle  tone. Coordination normal.  Gait is laborded with hip and back pain - needs cane but does not have it with her  Skin: Skin is warm and dry. No rash noted. No pallor.  Psychiatric: She has a normal mood and affect.          Assessment & Plan:   Problem List Items Addressed This Visit      Cardiovascular and Mediastinum   Essential hypertension - Primary    bp in fair control at this time  BP Readings from Last 1 Encounters:  08/27/16 130/70   No changes needed Disc lifstyle change with low sodium diet and exercise  Labs reviewed F/u 6 mo        Endocrine   Controlled type 2 diabetes mellitus with diabetic autonomic neuropathy ([place])    Lab Results  Component Value Date   HGBA1C 6.6 (H) 08/20/2016   This is up but no more hypoglycemia which is preferable  Disc diet  utd eye and food care Tolerating neuropathy (mostly L foot)-nl exam  F/u 6 mo  Continue metformin and amaryl        Other   History of fall    Pt fell in nov- had head lac and injury- did not call for help or seek it  Disc imp of getting help if needed  Also using rails by steps (now has)  Ramps as needed Keeping cane near  Agrees to do this       Hyperlipidemia    Controlled with crestor and diet Disc goals for lipids and reasons to control them Rev labs with pt Rev low sat fat diet in detail  HDL will go up with more activity hopefully in better weather      MDS (myelodysplastic syndrome) ([place])    Seeing hematology  No symptoms except for cold intolerance      Normocytic anemia    Seeing hematology for MDS  Thrombocytopenia ([place])    Pt now sees hematology for myelodysplastic syndrome

## 2016-08-27 NOTE — Assessment & Plan Note (Signed)
Seeing hematology for MDS

## 2016-08-27 NOTE — Patient Instructions (Addendum)
Glucose control is not quite as good  Start checking some levels in the afternoon or evenings  I'm glad you are not having low levels   Make sure to drink lots of water for kidney health  Goal is 64 oz of water daily (clear fluids-mostly water)   Keep watching diet   Be very careful not to fall Use a cane Use ramps and use rails whenever possible  Stay active as safely as you can   Follow up in 6 months for annual exam with lab prior

## 2016-08-27 NOTE — Assessment & Plan Note (Signed)
Pt fell in nov- had head lac and injury- did not call for help or seek it  Disc imp of getting help if needed  Also using rails by steps (now has)  Ramps as needed Keeping cane near  Agrees to do this

## 2016-08-27 NOTE — Progress Notes (Signed)
Pre visit review using our clinic review tool, if applicable. No additional management support is needed unless otherwise documented below in the visit note. 

## 2016-08-27 NOTE — Assessment & Plan Note (Signed)
Lab Results  Component Value Date   HGBA1C 6.6 (H) 08/20/2016   This is up but no more hypoglycemia which is preferable  Disc diet  utd eye and food care Tolerating neuropathy (mostly L foot)-nl exam  F/u 6 mo  Continue metformin and amaryl

## 2016-08-28 ENCOUNTER — Other Ambulatory Visit: Payer: Self-pay | Admitting: Family Medicine

## 2016-08-31 ENCOUNTER — Other Ambulatory Visit: Payer: Self-pay | Admitting: *Deleted

## 2016-08-31 DIAGNOSIS — D469 Myelodysplastic syndrome, unspecified: Secondary | ICD-10-CM

## 2016-09-03 ENCOUNTER — Ambulatory Visit (HOSPITAL_BASED_OUTPATIENT_CLINIC_OR_DEPARTMENT_OTHER): Payer: Medicare HMO | Admitting: Hematology

## 2016-09-03 ENCOUNTER — Encounter: Payer: Self-pay | Admitting: Hematology

## 2016-09-03 ENCOUNTER — Other Ambulatory Visit (HOSPITAL_BASED_OUTPATIENT_CLINIC_OR_DEPARTMENT_OTHER): Payer: Medicare HMO

## 2016-09-03 VITALS — BP 179/68 | HR 62 | Temp 97.8°F | Resp 16 | Ht 61.0 in | Wt 157.1 lb

## 2016-09-03 DIAGNOSIS — D696 Thrombocytopenia, unspecified: Secondary | ICD-10-CM

## 2016-09-03 DIAGNOSIS — D649 Anemia, unspecified: Secondary | ICD-10-CM

## 2016-09-03 DIAGNOSIS — D469 Myelodysplastic syndrome, unspecified: Secondary | ICD-10-CM

## 2016-09-03 LAB — CBC & DIFF AND RETIC
BASO%: 0.4 % (ref 0.0–2.0)
Basophils Absolute: 0 10*3/uL (ref 0.0–0.1)
EOS ABS: 0.2 10*3/uL (ref 0.0–0.5)
EOS%: 2.7 % (ref 0.0–7.0)
HCT: 32.7 % — ABNORMAL LOW (ref 34.8–46.6)
HGB: 10.8 g/dL — ABNORMAL LOW (ref 11.6–15.9)
IMMATURE RETIC FRACT: 11 % — AB (ref 1.60–10.00)
LYMPH#: 1.9 10*3/uL (ref 0.9–3.3)
LYMPH%: 34.6 % (ref 14.0–49.7)
MCH: 32.3 pg (ref 25.1–34.0)
MCHC: 33 g/dL (ref 31.5–36.0)
MCV: 97.9 fL (ref 79.5–101.0)
MONO#: 0.5 10*3/uL (ref 0.1–0.9)
MONO%: 9.3 % (ref 0.0–14.0)
NEUT%: 53 % (ref 38.4–76.8)
NEUTROS ABS: 3 10*3/uL (ref 1.5–6.5)
Platelets: 93 10*3/uL — ABNORMAL LOW (ref 145–400)
RBC: 3.34 10*6/uL — AB (ref 3.70–5.45)
RDW: 15.3 % — ABNORMAL HIGH (ref 11.2–14.5)
RETIC %: 1.64 % (ref 0.70–2.10)
RETIC CT ABS: 54.78 10*3/uL (ref 33.70–90.70)
WBC: 5.6 10*3/uL (ref 3.9–10.3)

## 2016-09-03 LAB — COMPREHENSIVE METABOLIC PANEL
ALBUMIN: 3.3 g/dL — AB (ref 3.5–5.0)
ALT: 23 U/L (ref 0–55)
AST: 37 U/L — AB (ref 5–34)
Alkaline Phosphatase: 79 U/L (ref 40–150)
Anion Gap: 8 mEq/L (ref 3–11)
BILIRUBIN TOTAL: 0.75 mg/dL (ref 0.20–1.20)
BUN: 28.8 mg/dL — AB (ref 7.0–26.0)
CO2: 23 meq/L (ref 22–29)
Calcium: 10 mg/dL (ref 8.4–10.4)
Chloride: 112 mEq/L — ABNORMAL HIGH (ref 98–109)
Creatinine: 1.1 mg/dL (ref 0.6–1.1)
EGFR: 46 mL/min/{1.73_m2} — AB (ref >90)
GLUCOSE: 109 mg/dL (ref 70–140)
Potassium: 4.4 mEq/L (ref 3.5–5.1)
SODIUM: 143 meq/L (ref 136–145)
TOTAL PROTEIN: 6.9 g/dL (ref 6.4–8.3)

## 2016-09-03 LAB — FERRITIN: FERRITIN: 56 ng/mL (ref 9–269)

## 2016-09-03 NOTE — Progress Notes (Signed)
Stacey Harris Kitchen  HEMATOLOGY ONCOLOGY PROGRESS NOTE  Date of service: 09/03/2016  Patient Care Team: Stacey Greenspan, MD as PCP - General Stacey Fam, MD as Consulting Physician (Ophthalmology)  CC:f/u for anemia and thrombocytopenia  Diagnosis: Chronic anemia and thrombocytopenia likely related to MDS  Current Treatment:  -current monitoring of Blood counts -would consider BM Bx +/- ESA/Iron if worsening anemia  INTERVAL HISTORY:  Patient is here for follow-up of her MDS. She notes no acute change in fatigue excessive bruising or infections. Hemoglobin has improved from 10 to 10.8. Platelet counts are relatively stable.  Notes that she did have a fall with some injury to her head with a scalp laceration and right forehead contusion. She was recommended to use a cane at all times to reduce her risk of falls. She notes some numbness in 3 of her fingers and right hand which seems to be related to carpal tunnel syndrome. She has been using a splint at nighttime.   REVIEW OF SYSTEMS:    10 Point review of systems of done and is negative except as noted above.  . Past Medical History:  Diagnosis Date  . Blood transfusion   . Cancer ([place])    basal cell skin CA  . Diabetes mellitus    type II  . Hyperlipidemia   . Hypertension   . Hypothyroidism   . OA (osteoarthritis)     . Past Surgical History:  Procedure Laterality Date  . ANKLE FRACTURE SURGERY  10   rt  . JOINT REPLACEMENT     rt total hip 10  . LUMBAR LAMINECTOMY/DECOMPRESSION MICRODISCECTOMY  06/22/2011   Procedure: LUMBAR LAMINECTOMY/DECOMPRESSION MICRODISCECTOMY;  Surgeon: Stacey Harris;  Location: [place];  Service: Orthopedics;  Laterality: N/A;  L3-4, L4-5 Decompression  . MOHS SURGERY  12/08   basal cell skin cancer lt temple  . TAH      and BSO  . TONSILLECTOMY      . Social History  Substance Use Topics  . Smoking status: Never Smoker  . Smokeless tobacco: Never Used  . Alcohol use No    ALLERGIES:  is  allergic to [facility] [trandolapril].  MEDICATIONS:  Current Outpatient Prescriptions  Medication Sig Dispense Refill  . ACCU-CHEK FASTCLIX LANCETS MISC Check blood sugar once daily and as directed.Dx 250.00 102 each 3  . aspirin 81 MG tablet Take 81 mg by mouth daily.      . Blood Glucose Calibration (ACCU-CHEK SMARTVIEW CONTROL) LIQD     . Blood Glucose Monitoring Suppl (ACCU-CHEK AVIVA PLUS) W/DEVICE KIT Check blood sugar once daily and as directed.Dx 250.00 1 kit 0  . glimepiride (AMARYL) 4 MG tablet TAKE 1 TABLET   DAILY BEFORE BREAKFAST. 90 tablet 1  . glucose blood (ACCU-CHEK AVIVA PLUS) test strip Check blood sugar once daily and as directed.Dx 250.00 100 each 3  . hydrochlorothiazide (HYDRODIURIL) 25 MG tablet TAKE 1 TABLET BY MOUTH EVERY MORNING 30 tablet 5  . levothyroxine (SYNTHROID, LEVOTHROID) 50 MCG tablet TAKE 1 TABLET EVERY DAY 90 tablet 0  . losartan (COZAAR) 50 MG tablet Take 1 tablet (50 mg total) by mouth daily. 90 tablet 3  . metFORMIN (GLUCOPHAGE) 1000 MG tablet TAKE 1 TABLET TWICE DAILY WITH MEALS 180 tablet 0  . metoprolol succinate (TOPROL-XL) 100 MG 24 hr tablet TAKE 1 TABLET BY MOUTH DAILY 30 tablet 5  . Multiple Vitamin (MULTIVITAMIN) capsule Take 1 capsule by mouth daily.    . Omega-3 Fatty Acids (FISH OIL) 1200 MG  CAPS Take 1,200 mg by mouth 2 (two) times daily.      . rosuvastatin (CRESTOR) 10 MG tablet Take 1 tablet (10 mg total) by mouth daily. 90 tablet 1   No current facility-administered medications for this visit.     PHYSICAL EXAMINATION: ECOG PERFORMANCE STATUS: 2  . Vitals:   09/03/16 0927  BP: (!) 179/68  Pulse: 62  Resp: 16  Temp: 97.8 F (36.6 C)    Filed Weights   09/03/16 0927  Weight: 157 lb 1 oz (71.2 kg)   .Body mass index is 29.68 kg/m.  GENERAL:alert, in no acute distress and comfortable SKIN: skin color, texture, turgor are normal, no rashes or significant lesions EYES: normal, conjunctiva are pink and non-injected, sclera  clear OROPHARYNX:no exudate, no erythema and lips, buccal mucosa, and tongue normal  NECK: supple, no JVD, thyroid normal size, non-tender, without nodularity LYMPH: no palpable lymphadenopathy in the cervical, axillary or inguinal LUNGS: clear to auscultation with normal respiratory effort HEART: regular rate & rhythm, no murmurs and no lower extremity edema ABDOMEN: abdomen soft, non-tender, normoactive bowel sounds  Musculoskeletal: no cyanosis of digits and no clubbing  PSYCH: alert & oriented x 3 with fluent speech NEURO: no focal motor/sensory deficits  LABORATORY DATA:   . CBC Latest Ref Rng & Units 09/03/2016 02/27/2016 02/17/2016  WBC 3.9 - 10.3 10e3/uL 5.6 5.3 5.4  Hemoglobin 11.6 - 15.9 g/dL 10.8(L) 10.0(L) 10.1(L)  Hematocrit 34.8 - 46.6 % 32.7(L) 30.1(L) 30.4(L)  Platelets 145 - 400 10e3/uL 93(L) 102(L) 102.0(L)   . CMP Latest Ref Rng & Units 09/03/2016 08/20/2016 02/27/2016  Glucose 70 - 140 mg/dl 109 102(H) 115  BUN 7.0 - 26.0 mg/dL 28.8(H) 32(H) 32.3(H)  Creatinine 0.6 - 1.1 mg/dL 1.1 1.04 1.1  Sodium 136 - 145 mEq/L 143 143 144  Potassium 3.5 - 5.1 mEq/L 4.4 3.9 4.1  Chloride 96 - 112 mEq/L - 111 -  CO2 22 - 29 mEq/L 23 25 24   Calcium 8.4 - 10.4 mg/dL 10.0 9.3 10.0  Total Protein 6.4 - 8.3 g/dL 6.9 6.4 6.6  Total Bilirubin 0.20 - 1.20 mg/dL 0.75 0.7 0.76  Alkaline Phos 40 - 150 U/L 79 66 78  AST 5 - 34 U/L 37(H) 34 54(H)  ALT 0 - 55 U/L 23 20 34   I have reviewed the data as listed  RADIOGRAPHIC STUDIES: I have personally reviewed the radiological images as listed and agreed with the findings in the report. No results found.  ASSESSMENT & PLAN:   Stacey Harris is a very sweet 83 year old female with  1) Chronic anemia. Normocytic but with high normal MCV levels. No evidence of hemolysis on peripheral blood smear or labs. Peripheral blood smear shows macrocytic RBC population. Normal B12. No history of alcohol. Hypothyroidism is well treated. Presence of  large platelets and dysplastic WBCs in addition to macrocytic RBCs is consistent with the possibility of myelodysplastic syndrome. Hemoglobin is stable and somewhat improved at 10.8 Ferritin is stable at 87--- rpt ferritin from today is currently pending. Recent TSH is within normal limits Erythropoietin 34.7-low enough that it suggests she might be a responder to ESA's if needed.  2) mild Thrombocytopenia -the counts are being underestimated at least partly due to platelet clumping on the smear as noted above. Platelets are stable at 93k It is also partly due to her presentation of myelodysplastic syndrome. Plan -Patient's anemia and some cytopenia is relatively stable at this time. -No issues with worsening fatigue infections or  bleeding. -No indication for bone marrow biopsy at this time unless her counts significantly worsen  -No indication for the use of ESA's at this time -iron at goal of ferritin >50 currently. Pending ferritin level from today. -Could adjust her iron polysaccharide dose to maintain adequate iron levels. -might need additional iron  to maintain ferritin >100 if patient more anemic and ESA's use contemplated. -Continue daily multivitamin  Given the relative stability of the patient's blood counts for more than a year we will discharge the patient back to her primary care physician for continued monitoring of blood counts every 6 months.  Kindly consult [place] if her anemia or thrombocytopenia get significantly worse.  . No orders of the defined types were placed in this encounter.   I spent 15 minutes counseling the patient face to face. The total time spent in the appointment was 20 minutes and more than 50% was on counseling and direct patient cares.    Sullivan Lone MD [place] AAHIVMS [facility] Hematology/Oncology Physician [facility]  (Office):       [phone] (Work cell):  [phone] (Fax):           [phone]

## 2016-10-09 DIAGNOSIS — H903 Sensorineural hearing loss, bilateral: Secondary | ICD-10-CM | POA: Diagnosis not present

## 2016-10-28 ENCOUNTER — Other Ambulatory Visit: Payer: Self-pay | Admitting: Family Medicine

## 2016-11-02 ENCOUNTER — Other Ambulatory Visit: Payer: Self-pay | Admitting: Family Medicine

## 2016-12-11 ENCOUNTER — Other Ambulatory Visit: Payer: Self-pay | Admitting: Family Medicine

## 2016-12-31 ENCOUNTER — Other Ambulatory Visit: Payer: Self-pay | Admitting: Family Medicine

## 2017-01-07 ENCOUNTER — Telehealth: Payer: Self-pay | Admitting: *Deleted

## 2017-01-07 MED ORDER — ACCU-CHEK AVIVA PLUS W/DEVICE KIT: PACK | 0 refills | Status: DC

## 2017-01-07 MED ORDER — LOSARTAN POTASSIUM 50 MG PO TABS: 50.0000 mg | ORAL_TABLET | Freq: Every day | ORAL | 1 refills | Status: DC

## 2017-01-07 MED ORDER — METFORMIN HCL 1000 MG PO TABS: 1000.0000 mg | ORAL_TABLET | Freq: Two times a day (BID) | ORAL | 0 refills | Status: DC

## 2017-01-07 MED ORDER — ACCU-CHEK FASTCLIX LANCETS MISC: 3 refills | Status: DC

## 2017-01-07 MED ORDER — LEVOTHYROXINE SODIUM 50 MCG PO TABS: 50.0000 ug | ORAL_TABLET | Freq: Every day | ORAL | 1 refills | Status: DC

## 2017-01-07 MED ORDER — GLUCOSE BLOOD VI STRP: ORAL_STRIP | 3 refills | Status: DC

## 2017-01-07 MED ORDER — GLIMEPIRIDE 4 MG PO TABS: ORAL_TABLET | ORAL | 1 refills | Status: DC

## 2017-01-07 MED ORDER — METOPROLOL SUCCINATE ER 100 MG PO TB24: 100.0000 mg | ORAL_TABLET | Freq: Every day | ORAL | 1 refills | Status: DC

## 2017-01-07 NOTE — Telephone Encounter (Signed)
[facility] request refill diabetic meter,strips,lancets, metformin (all these refilled) and losartan; per previous note request refill losartan sent to Dr Damita Dunnings and Dr Glori Bickers.
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
## 2017-01-10 ENCOUNTER — Other Ambulatory Visit: Payer: Self-pay

## 2017-01-10 MED ORDER — DIAZEPAM 5 MG PO TABS: 2.5000 mg | ORAL_TABLET | Freq: Every evening | ORAL | 0 refills | Status: DC | PRN

## 2017-01-10 NOTE — Telephone Encounter (Signed)
Blanch Media pts daughter in law [facility] signed) left v/m requesting refill diazepam to [facility]. Last refilled # 30 on 12/30/15. Pt has CPX scheduled on 02/11/17.

## 2017-01-10 NOTE — Telephone Encounter (Signed)
Px written for call in   

## 2017-01-11 NOTE — Telephone Encounter (Signed)
Rx called in as prescribed 
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
## 2017-02-02 ENCOUNTER — Telehealth: Payer: Self-pay | Admitting: Family Medicine

## 2017-02-02 DIAGNOSIS — E1143 Type 2 diabetes mellitus with diabetic autonomic (poly)neuropathy: Secondary | ICD-10-CM

## 2017-02-02 DIAGNOSIS — Z0000 Encounter for general adult medical examination without abnormal findings: Secondary | ICD-10-CM

## 2017-02-02 NOTE — Telephone Encounter (Signed)
-----   Message from Ellamae Sia sent at 01/23/2017  4:15 PM EDT ----- Regarding: Lab orders for Monday, 7.30.18  AWV lab orders, please.

## 2017-02-04 ENCOUNTER — Ambulatory Visit (INDEPENDENT_AMBULATORY_CARE_PROVIDER_SITE_OTHER): Payer: Medicare HMO

## 2017-02-04 ENCOUNTER — Other Ambulatory Visit (INDEPENDENT_AMBULATORY_CARE_PROVIDER_SITE_OTHER): Payer: Medicare HMO

## 2017-02-04 VITALS — BP 112/64 | HR 82 | Resp 16 | Ht 60.83 in | Wt 155.4 lb

## 2017-02-04 DIAGNOSIS — Z0000 Encounter for general adult medical examination without abnormal findings: Secondary | ICD-10-CM | POA: Diagnosis not present

## 2017-02-04 DIAGNOSIS — E1143 Type 2 diabetes mellitus with diabetic autonomic (poly)neuropathy: Secondary | ICD-10-CM

## 2017-02-04 LAB — COMPREHENSIVE METABOLIC PANEL
ALBUMIN: 3.4 g/dL — AB (ref 3.5–5.2)
ALK PHOS: 56 U/L (ref 39–117)
ALT: 22 U/L (ref 0–35)
AST: 35 U/L (ref 0–37)
BUN: 34 mg/dL — AB (ref 6–23)
CO2: 24 mEq/L (ref 19–32)
CREATININE: 1.18 mg/dL (ref 0.40–1.20)
Calcium: 9.3 mg/dL (ref 8.4–10.5)
Chloride: 109 mEq/L (ref 96–112)
GFR: 46.19 mL/min — ABNORMAL LOW (ref >60.00)
GLUCOSE: 114 mg/dL — AB (ref 70–99)
Potassium: 4.1 mEq/L (ref 3.5–5.1)
SODIUM: 143 meq/L (ref 135–145)
TOTAL PROTEIN: 6.5 g/dL (ref 6.0–8.3)
Total Bilirubin: 0.8 mg/dL (ref 0.2–1.2)

## 2017-02-04 LAB — CBC WITH DIFFERENTIAL/PLATELET
BASOS ABS: 0.1 10*3/uL (ref 0.0–0.1)
Basophils Relative: 1.2 % (ref 0.0–3.0)
EOS ABS: 0.1 10*3/uL (ref 0.0–0.7)
Eosinophils Relative: 2.4 % (ref 0.0–5.0)
HCT: 31.4 % — ABNORMAL LOW (ref 36.0–46.0)
Hemoglobin: 10.4 g/dL — ABNORMAL LOW (ref 12.0–15.0)
LYMPHS ABS: 1.7 10*3/uL (ref 0.7–4.0)
Lymphocytes Relative: 31.8 % (ref 12.0–46.0)
MCHC: 33.2 g/dL (ref 30.0–36.0)
MCV: 101.6 fl — AB (ref 78.0–100.0)
MONO ABS: 0.4 10*3/uL (ref 0.1–1.0)
MONOS PCT: 8.1 % (ref 3.0–12.0)
NEUTROS ABS: 3 10*3/uL (ref 1.4–7.7)
Neutrophils Relative %: 56.5 % (ref 43.0–77.0)
PLATELETS: 86 10*3/uL — AB (ref 150.0–400.0)
RBC: 3.09 Mil/uL — AB (ref 3.87–5.11)
RDW: 15 % (ref 11.5–15.5)
WBC: 5.3 10*3/uL (ref 4.0–10.5)

## 2017-02-04 LAB — LIPID PANEL
CHOLESTEROL: 99 mg/dL (ref 0–200)
HDL: 28.5 mg/dL — ABNORMAL LOW (ref >39.00)
LDL Cholesterol: 53 mg/dL (ref 0–99)
NONHDL: 70.32
Total CHOL/HDL Ratio: 3
Triglycerides: 86 mg/dL (ref 0.0–149.0)
VLDL: 17.2 mg/dL (ref 0.0–40.0)

## 2017-02-04 LAB — HEMOGLOBIN A1C: HEMOGLOBIN A1C: 6.5 % (ref 4.6–6.5)

## 2017-02-04 LAB — TSH: TSH: 2.27 u[IU]/mL (ref 0.35–4.50)

## 2017-02-04 NOTE — Patient Instructions (Addendum)
Stacey Harris ,  Bring a copy of your advance directives to your next office visit.   Thank you for taking time to come for your Medicare Wellness Visit. I appreciate your ongoing commitment to your health goals. Please review the following plan we discussed and let me know if I can assist you in the future.   These are the goals we discussed: Goals    . Increase physical activity          Starting 12/30/2015, I will continue to exercise for at least 30 min daily.        This is a list of the screening recommended for you and due dates:  Health Maintenance  Topic Date Due  . Mammogram  08/10/2020*  . DEXA scan (bone density measurement)  08/10/2020*  . Tetanus Vaccine  08/10/2020*  . Flu Shot  02/06/2017  . Hemoglobin A1C  02/17/2017  . Eye exam for diabetics  08/13/2017  . Complete foot exam   08/27/2017  . Pneumonia vaccines  Completed  *Topic was postponed. The date shown is not the original due date.    Preventive Care for Adults  A healthy lifestyle and preventive care can promote health and wellness. Preventive health guidelines for adults include the following key practices.  . A routine yearly physical is a good way to check with your health care provider about your health and preventive screening. It is a chance to share any concerns and updates on your health and to receive a thorough exam.  . Visit your dentist for a routine exam and preventive care every 6 months. Brush your teeth twice a day and floss once a day. Good oral hygiene prevents tooth decay and gum disease.  . The frequency of eye exams is based on your age, health, family medical history, use  of contact lenses, and other factors. Follow your health care provider's ecommendations for frequency of eye exams.  . Eat a healthy diet. Foods like vegetables, fruits, whole grains, low-fat dairy products, and lean protein foods contain the nutrients you need without too many calories. Decrease your intake of  foods high in solid fats, added sugars, and salt. Eat the right amount of calories for you. Get information about a proper diet from your health care provider, if necessary.  . Regular physical exercise is one of the most important things you can do for your health. Most adults should get at least 150 minutes of moderate-intensity exercise (any activity that increases your heart rate and causes you to sweat) each week. In addition, most adults need muscle-strengthening exercises on 2 or more days a week.  Silver Sneakers may be a benefit available to you. To determine eligibility, you may visit the website: www.silversneakers.com or contact program at (567)364-1911 Mon-Fri between 8AM-8PM.   . Maintain a healthy weight. The body mass index (BMI) is a screening tool to identify possible weight problems. It provides an estimate of body fat based on height and weight. Your health care provider can find your BMI and can help you achieve or maintain a healthy weight.   For adults 20 years and older: ? A BMI below 18.5 is considered underweight. ? A BMI of 18.5 to 24.9 is normal. ? A BMI of 25 to 29.9 is considered overweight. ? A BMI of 30 and above is considered obese.   . Maintain normal blood lipids and cholesterol levels by exercising and minimizing your intake of saturated fat. Eat a balanced diet with plenty of  fruit and vegetables. Blood tests for lipids and cholesterol should begin at age 88 and be repeated every 5 years. If your lipid or cholesterol levels are high, you are over 50, or you are at high risk for heart disease, you may need your cholesterol levels checked more frequently. Ongoing high lipid and cholesterol levels should be treated with medicines if diet and exercise are not working.  . If you smoke, find out from your health care provider how to quit. If you do not use tobacco, please do not start.  . If you choose to drink alcohol, please do not consume more than 2 drinks per  day. One drink is considered to be 12 ounces (355 mL) of beer, 5 ounces (148 mL) of wine, or 1.5 ounces (44 mL) of liquor.  . If you are 35-31 years old, ask your health care provider if you should take aspirin to prevent strokes.  . Use sunscreen. Apply sunscreen liberally and repeatedly throughout the day. You should seek shade when your shadow is shorter than you. Protect yourself by wearing long sleeves, pants, a wide-brimmed hat, and sunglasses year round, whenever you are outdoors.  . Once a month, do a whole body skin exam, using a mirror to look at the skin on your back. Tell your health care provider of new moles, moles that have irregular borders, moles that are larger than a pencil eraser, or moles that have changed in shape or color.

## 2017-02-11 ENCOUNTER — Ambulatory Visit (INDEPENDENT_AMBULATORY_CARE_PROVIDER_SITE_OTHER): Payer: Medicare HMO | Admitting: Family Medicine

## 2017-02-11 ENCOUNTER — Encounter: Payer: Self-pay | Admitting: Family Medicine

## 2017-02-11 VITALS — BP 126/70 | HR 72 | Temp 97.8°F | Resp 16 | Ht 61.5 in | Wt 156.0 lb

## 2017-02-11 DIAGNOSIS — I10 Essential (primary) hypertension: Secondary | ICD-10-CM

## 2017-02-11 DIAGNOSIS — D469 Myelodysplastic syndrome, unspecified: Secondary | ICD-10-CM | POA: Diagnosis not present

## 2017-02-11 DIAGNOSIS — Z0001 Encounter for general adult medical examination with abnormal findings: Secondary | ICD-10-CM

## 2017-02-11 DIAGNOSIS — E039 Hypothyroidism, unspecified: Secondary | ICD-10-CM | POA: Diagnosis not present

## 2017-02-11 DIAGNOSIS — R202 Paresthesia of skin: Secondary | ICD-10-CM | POA: Diagnosis not present

## 2017-02-11 DIAGNOSIS — K219 Gastro-esophageal reflux disease without esophagitis: Secondary | ICD-10-CM

## 2017-02-11 DIAGNOSIS — E1143 Type 2 diabetes mellitus with diabetic autonomic (poly)neuropathy: Secondary | ICD-10-CM

## 2017-02-11 DIAGNOSIS — E7800 Pure hypercholesterolemia, unspecified: Secondary | ICD-10-CM

## 2017-02-11 DIAGNOSIS — D696 Thrombocytopenia, unspecified: Secondary | ICD-10-CM | POA: Diagnosis not present

## 2017-02-11 DIAGNOSIS — Z0000 Encounter for general adult medical examination without abnormal findings: Secondary | ICD-10-CM

## 2017-02-11 MED ORDER — HYDROCHLOROTHIAZIDE 25 MG PO TABS
25.0000 mg | ORAL_TABLET | Freq: Every morning | ORAL | 0 refills | Status: DC
Start: 2017-02-11 — End: 2017-03-21

## 2017-02-11 MED ORDER — RANITIDINE HCL 150 MG PO CAPS: 150.0000 mg | ORAL_CAPSULE | Freq: Two times a day (BID) | ORAL | 3 refills | Status: DC

## 2017-02-11 MED ORDER — METFORMIN HCL 1000 MG PO TABS: 1000.0000 mg | ORAL_TABLET | Freq: Two times a day (BID) | ORAL | 0 refills | Status: DC

## 2017-02-11 MED ORDER — HYDROCHLOROTHIAZIDE 25 MG PO TABS: 25.0000 mg | ORAL_TABLET | Freq: Every morning | ORAL | 3 refills | Status: DC

## 2017-02-11 MED ORDER — METOPROLOL SUCCINATE ER 100 MG PO TB24: 100.0000 mg | ORAL_TABLET | Freq: Every day | ORAL | 1 refills | Status: DC

## 2017-02-11 MED ORDER — GLIMEPIRIDE 4 MG PO TABS: ORAL_TABLET | ORAL | 0 refills | Status: DC

## 2017-02-11 MED ORDER — ROSUVASTATIN CALCIUM 10 MG PO TABS: 10.0000 mg | ORAL_TABLET | Freq: Every day | ORAL | 3 refills | Status: DC

## 2017-02-11 MED ORDER — METOPROLOL SUCCINATE ER 100 MG PO TB24: 100.0000 mg | ORAL_TABLET | Freq: Every day | ORAL | 0 refills | Status: DC

## 2017-02-11 MED ORDER — LOSARTAN POTASSIUM 50 MG PO TABS: 50.0000 mg | ORAL_TABLET | Freq: Every day | ORAL | 3 refills | Status: DC

## 2017-02-11 MED ORDER — METOPROLOL SUCCINATE ER 100 MG PO TB24: 100.0000 mg | ORAL_TABLET | Freq: Every day | ORAL | 3 refills | Status: DC

## 2017-02-11 MED ORDER — GLIMEPIRIDE 4 MG PO TABS: ORAL_TABLET | ORAL | 3 refills | Status: DC

## 2017-02-11 MED ORDER — LEVOTHYROXINE SODIUM 50 MCG PO TABS: 50.0000 ug | ORAL_TABLET | Freq: Every day | ORAL | 0 refills | Status: DC

## 2017-02-11 MED ORDER — RANITIDINE HCL 150 MG PO CAPS: 150.0000 mg | ORAL_CAPSULE | Freq: Two times a day (BID) | ORAL | 0 refills | Status: DC

## 2017-02-11 MED ORDER — LEVOTHYROXINE SODIUM 50 MCG PO TABS: 50.0000 ug | ORAL_TABLET | Freq: Every day | ORAL | 3 refills | Status: DC

## 2017-02-11 MED ORDER — LOSARTAN POTASSIUM 50 MG PO TABS: 50.0000 mg | ORAL_TABLET | Freq: Every day | ORAL | 0 refills | Status: DC

## 2017-02-11 MED ORDER — ROSUVASTATIN CALCIUM 10 MG PO TABS: 10.0000 mg | ORAL_TABLET | Freq: Every day | ORAL | 0 refills | Status: DC

## 2017-02-11 NOTE — Assessment & Plan Note (Signed)
Symptoms consistent with CTS bilaterally  Interfering with needle work  Wearing splints at night  Ref to neuro for further eval/tx
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
## 2017-02-11 NOTE — Assessment & Plan Note (Signed)
Suspect this may be causing am nausea and cough  Will re start zantac 150 mg bid  Update if no improvement with this

## 2017-02-11 NOTE — Assessment & Plan Note (Signed)
CBC reviewed  Only symptom is cold intolerance  Sees hematology ongoing

## 2017-02-11 NOTE — Assessment & Plan Note (Signed)
Disc goals for lipids and reasons to control them Rev labs with pt Rev low sat fat diet in detail  LDL controlled with crestor  HDL baseline low- enc exercise and fish oil/ fish in diet
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
## 2017-02-11 NOTE — Assessment & Plan Note (Addendum)
Due to MDS Followed by hematology

## 2017-02-11 NOTE — Assessment & Plan Note (Signed)
bp in fair control at this time  BP Readings from Last 1 Encounters:  02/11/17 126/70   No changes needed Disc lifstyle change with low sodium diet and exercise  Labs reviewed

## 2017-02-11 NOTE — Assessment & Plan Note (Signed)
Lab Results  Component Value Date   HGBA1C 6.5 02/04/2017   This is stable and well controlled utd foot and eye care  Continue amaryl Losartan for renal protection

## 2017-02-11 NOTE — Assessment & Plan Note (Signed)
Hypothyroidism  Pt has no clinical changes No change in energy level/ hair or skin/ edema and no tremor Lab Results  Component Value Date   TSH 2.27 02/04/2017

## 2017-02-11 NOTE — Patient Instructions (Addendum)
You are due for a tetanus shot - you can check on coverage from a pharmacy   Get back on zantac 150 mg twice daily for cough and "silent reflux" Let us know if it does not help   We will refer you to neurology for carpal tunnel testing and treatment   I recommend seeing Dr Lorin Mercy for your back and hip problems

## 2017-03-02 ENCOUNTER — Other Ambulatory Visit: Payer: Self-pay | Admitting: Family Medicine

## 2017-03-07 ENCOUNTER — Telehealth: Payer: Self-pay

## 2017-03-07 NOTE — Telephone Encounter (Signed)
Stacey Harris (DPR signed) said that [facility] sent the wrong test strips to pt and are resending test strips but pt is out of test strips and wants accuchek aviva test strips sent [facility]. [facility]; I spoke with [facility] Pharmacist and she said Stacey Harris needs to contact [facility] since they have blocked the test strips and ask them to do override so [facility] can fill test strips. Stacey Harris voiced understanding and will contact [facility].

## 2017-03-09 ENCOUNTER — Other Ambulatory Visit: Payer: Self-pay | Admitting: Family Medicine

## 2017-03-21 ENCOUNTER — Other Ambulatory Visit: Payer: Self-pay | Admitting: *Deleted

## 2017-03-21 MED ORDER — HYDROCHLOROTHIAZIDE 25 MG PO TABS: 25.0000 mg | ORAL_TABLET | Freq: Every morning | ORAL | 5 refills | Status: DC

## 2017-04-19 ENCOUNTER — Ambulatory Visit: Payer: Medicare HMO | Admitting: Diagnostic Neuroimaging

## 2017-04-26 ENCOUNTER — Encounter: Payer: Self-pay | Admitting: Diagnostic Neuroimaging

## 2017-04-26 ENCOUNTER — Ambulatory Visit (INDEPENDENT_AMBULATORY_CARE_PROVIDER_SITE_OTHER): Payer: Medicare HMO | Admitting: Diagnostic Neuroimaging

## 2017-04-26 VITALS — BP 186/83 | HR 65 | Ht 62.0 in | Wt 158.8 lb

## 2017-04-26 DIAGNOSIS — R200 Anesthesia of skin: Secondary | ICD-10-CM | POA: Diagnosis not present

## 2017-04-26 NOTE — Progress Notes (Signed)
[facility]  PATIENT: Stacey Harris DOB: 03-Nov-1928  REFERRING CLINICIAN: Thea Alken, MD HISTORY FROM: patient and chart review  REASON FOR VISIT: new consult    HISTORICAL  CHIEF COMPLAINT:  Chief Complaint  Patient presents with  . Parasthesia of hands    rm 6, New Pt, dgtr-in-law- Blanch Media, "issue with hands x 2-3 years, pain/tingling/numbness; unable to use hands like I used to"    HISTORY OF PRESENT ILLNESS:   83 year old female with hypertension, diabetes, hypercholesterolemia, here for evaluation of bilateral hand numbness and pain. Patient referred here for carpal tunnel syndrome evaluation.  Patient reports 2-3 years of evening and nocturnal numbness and pain in the hands. Symptoms mainly affect digits 1-3. Patient denies neck pain. No new problems with her lower extremities. Patient has tried wrist splints without relief.   REVIEW OF SYSTEMS: Full 14 system review of systems performed and negative with exception of: Numbness feeling cold joint pain shortness of breath swelling in legs hearing loss ringing in ears.  ALLERGIES: Allergies  Allergen Reactions  . Mavik [Trandolapril]     Cough     HOME MEDICATIONS: Outpatient Medications Prior to Visit  Medication Sig Dispense Refill  . aspirin 81 MG tablet Take 81 mg by mouth daily.      . diazepam (VALIUM) 5 MG tablet Take 0.5 tablets (2.5 mg total) by mouth at bedtime as needed. 30 tablet 0  . glimepiride (AMARYL) 4 MG tablet TAKE 1 TABLET EVERY DAY BEFORE BREAKFAST 15 tablet 0  . hydrochlorothiazide (HYDRODIURIL) 25 MG tablet Take 1 tablet (25 mg total) by mouth every morning. 30 tablet 5  . levothyroxine (SYNTHROID, LEVOTHROID) 50 MCG tablet Take 1 tablet (50 mcg total) by mouth daily. 15 tablet 0  . losartan (COZAAR) 50 MG tablet Take 1 tablet (50 mg total) by mouth daily. 15 tablet 0  . metFORMIN (GLUCOPHAGE) 1000 MG tablet Take 1 tablet (1,000 mg total) by mouth 2 (two) times daily with a  meal. 180 tablet 0  . metoprolol succinate (TOPROL-XL) 100 MG 24 hr tablet Take 1 tablet (100 mg total) by mouth daily. Take with or immediately following a meal. 90 tablet 3  . Multiple Vitamin (MULTIVITAMIN) capsule Take 1 capsule by mouth daily.    . Omega-3 Fatty Acids (FISH OIL) 1200 MG CAPS Take 1,200 mg by mouth 2 (two) times daily.      . ranitidine (ZANTAC) 150 MG capsule Take 1 capsule (150 mg total) by mouth 2 (two) times daily. 30 capsule 0  . rosuvastatin (CRESTOR) 10 MG tablet Take 1 tablet (10 mg total) by mouth daily. 15 tablet 0  . ACCU-CHEK FASTCLIX LANCETS MISC Check blood sugar once daily and as directed.Dx E11.43 (Patient not taking: Reported on 04/26/2017) 102 each 3  . Blood Glucose Calibration (ACCU-CHEK SMARTVIEW CONTROL) LIQD     . Blood Glucose Monitoring Suppl (ACCU-CHEK AVIVA PLUS) w/Device KIT Check blood sugar once daily and as directed.Dx E11.43 (Patient not taking: Reported on 04/26/2017) 1 kit 0  . glucose blood (ACCU-CHEK AVIVA PLUS) test strip Check blood sugar once daily and as directed.Dx E11.43 (Patient not taking: Reported on 04/26/2017) 100 each 3   No facility-administered medications prior to visit.     PAST MEDICAL HISTORY: Past Medical History:  Diagnosis Date  . Blood transfusion   . Cancer ([place])    basal cell skin CA  . Diabetes mellitus    type II  . Hyperlipidemia   . Hypertension   .  Hypothyroidism   . OA (osteoarthritis)     PAST SURGICAL HISTORY: Past Surgical History:  Procedure Laterality Date  . ANKLE FRACTURE SURGERY  10   rt  . JOINT REPLACEMENT Right 2010   rt total hip 10  . LUMBAR LAMINECTOMY/DECOMPRESSION MICRODISCECTOMY  06/22/2011   Procedure: LUMBAR LAMINECTOMY/DECOMPRESSION MICRODISCECTOMY;  Surgeon: Marybelle Killings;  Location: [facility];  Service: Orthopedics;  Laterality: N/A;  L3-4, L4-5 Decompression  . MOHS SURGERY  12/08   basal cell skin cancer lt temple  . TAH      and BSO, age 9  . TONSILLECTOMY       FAMILY HISTORY: Family History  Problem Relation Age of Onset  . Arthritis Father        RA  . Hypertension Father   . Heart disease Father        CAD  . Diabetes Father   . Arthritis Sister        RA  . Diabetes Brother   . Cancer Brother   . Diabetes Brother   . Cancer Brother   . Diabetes Brother   . Cancer Brother   . Diabetes Sister   . Cancer Brother     SOCIAL HISTORY:  Social History   Social History  . Marital status: Widowed    Spouse name: N/A  . Number of children: 4  . Years of education: 14   Occupational History  . Not on file.   Social History Main Topics  . Smoking status: Never Smoker  . Smokeless tobacco: Never Used  . Alcohol use No  . Drug use: No  . Sexual activity: No   Other Topics Concern  . Not on file   Social History Narrative   Lives with daughter   No caffeine     PHYSICAL EXAM  GENERAL EXAM/CONSTITUTIONAL: Vitals:  Vitals:   04/26/17 1004  BP: (!) 186/83  Pulse: 65  Weight: 158 lb 12.8 oz (72 kg)  Height: 5' 2"  (1.575 m)     Body mass index is 29.04 kg/m.  Vision Screening Comments: Bifocals, eyes checked Jan 2018, need cataract surgery  Patient is in no distress; well developed, nourished and groomed; neck is supple  CARDIOVASCULAR:  Examination of carotid arteries is normal; no carotid bruits  Regular rate and rhythm, no murmurs  Examination of peripheral vascular system by observation and palpation is normal  EYES:  Ophthalmoscopic exam of optic discs and posterior segments is normal; no papilledema or hemorrhages  MUSCULOSKELETAL:  Gait, strength, tone, movements noted in Neurologic exam below  NEUROLOGIC: MENTAL STATUS:  MMSE - Mini Mental State Exam 02/04/2017 12/30/2015  Orientation to time 4 5  Orientation to Place 4 5  Registration 3 3  Attention/ Calculation 0 0  Recall 2 3  Language- name 2 objects 0 0  Language- repeat 1 1  Language- follow 3 step command 3 3  Language- read  & follow direction 0 0  Write a sentence 0 0  Copy design 0 0  Total score 17 20    BY CONVERSATION:  awake, alert, oriented to person, place and time  recent and remote memory intact  normal attention and concentration  language fluent, comprehension intact, naming intact,   fund of knowledge appropriate  CRANIAL NERVE:   2nd - no papilledema on fundoscopic exam  2nd, 3rd, 4th, 6th - pupils equal and reactive to light, visual fields full to confrontation, extraocular muscles intact, no nystagmus  5th - facial sensation  symmetric  7th - facial strength symmetric  8th - hearing intact  9th - palate elevates symmetrically, uvula midline  11th - shoulder shrug symmetric  12th - tongue protrusion midline  MOTOR:   normal bulk and tone, full strength in the BUE, BLE  EXCEPT ATROPHY AND WEAKNESS OF BILATERAL APB (THUMB ABDUCTION)  SENSORY:   normal and symmetric to light touch, temperature  DECR PP IN BILATERAL HANDS AND FEET (LEFT FOOT WORSE THAN RIGHT)  DECR VIB IN LEFT FOOT  POSITIVE TINEL'S AND PHALEN'S BILATERALLY (MEDIAN NERVES AT WRISTS)  COORDINATION:   finger-nose-finger, fine finger movements normal  REFLEXES:   deep tendon reflexes TRACE and symmetric  GAIT/STATION:   narrow based gait; SLOW AND CAUTIOUS    DIAGNOSTIC DATA (LABS, IMAGING, TESTING) - I reviewed patient records, labs, notes, testing and imaging myself where available.  Lab Results  Component Value Date   WBC 5.3 02/04/2017   HGB 10.4 (L) 02/04/2017   HCT 31.4 (L) 02/04/2017   MCV 101.6 (H) 02/04/2017   PLT 86.0 (L) 02/04/2017      Component Value Date/Time   NA 143 02/04/2017 1042   NA 143 09/03/2016 0902   K 4.1 02/04/2017 1042   K 4.4 09/03/2016 0902   CL 109 02/04/2017 1042   CO2 24 02/04/2017 1042   CO2 23 09/03/2016 0902   GLUCOSE 114 (H) 02/04/2017 1042   GLUCOSE 109 09/03/2016 0902   BUN 34 (H) 02/04/2017 1042   BUN 28.8 (H) 09/03/2016 0902    CREATININE 1.18 02/04/2017 1042   CREATININE 1.1 09/03/2016 0902   CALCIUM 9.3 02/04/2017 1042   CALCIUM 10.0 09/03/2016 0902   PROT 6.5 02/04/2017 1042   PROT 6.9 09/03/2016 0902   ALBUMIN 3.4 (L) 02/04/2017 1042   ALBUMIN 3.3 (L) 09/03/2016 0902   AST 35 02/04/2017 1042   AST 37 (H) 09/03/2016 0902   ALT 22 02/04/2017 1042   ALT 23 09/03/2016 0902   ALKPHOS 56 02/04/2017 1042   ALKPHOS 79 09/03/2016 0902   BILITOT 0.8 02/04/2017 1042   BILITOT 0.75 09/03/2016 0902   GFRNONAA 58 (L) 06/19/2011 1326   GFRAA 67 (L) 06/19/2011 1326   Lab Results  Component Value Date   CHOL 99 02/04/2017   HDL 28.50 (L) 02/04/2017   LDLCALC 53 02/04/2017   LDLDIRECT 202.7 08/17/2010   TRIG 86.0 02/04/2017   CHOLHDL 3 02/04/2017   Lab Results  Component Value Date   HGBA1C 6.5 02/04/2017   Lab Results  Component Value Date   VITAMINB12 1,410 (H) 02/28/2015   Lab Results  Component Value Date   TSH 2.27 02/04/2017       ASSESSMENT AND PLAN  83 y.o. year old female here with Bilateral hand numbness and pain for past 2-3 years. Signs and symptoms are consistent with carpal tunnel syndrome. Will proceed with further workup.   Dx: carpal tunnel syndrome (bilateral)  1. Hand numbness      PLAN: - check EMG/NCS - then consider hand surgery consult  Orders Placed This Encounter  Procedures  . NCV with EMG(electromyography)   Return for for NCV/EMG.    Penni Bombard, MD 16/04/9603, 54:09 AM Certified in Neurology, Neurophysiology and Neuroimaging  [facility] [address] [place], [place] [address] [phone]

## 2017-04-26 NOTE — Patient Instructions (Signed)
-   return for electrical nerve testing (EMG/NCS)

## 2017-05-16 ENCOUNTER — Encounter: Payer: Medicare HMO | Admitting: Diagnostic Neuroimaging

## 2017-05-16 ENCOUNTER — Ambulatory Visit (INDEPENDENT_AMBULATORY_CARE_PROVIDER_SITE_OTHER): Payer: Medicare HMO | Admitting: Diagnostic Neuroimaging

## 2017-05-16 DIAGNOSIS — R200 Anesthesia of skin: Secondary | ICD-10-CM | POA: Diagnosis not present

## 2017-05-16 DIAGNOSIS — Z0289 Encounter for other administrative examinations: Secondary | ICD-10-CM

## 2017-05-17 NOTE — Procedures (Signed)
[facility]  NCS (NERVE CONDUCTION STUDY) WITH EMG (ELECTROMYOGRAPHY) REPORT   STUDY DATE: 05/16/17 PATIENT NAME: Stacey Harris DOB: Mar 06, 1929 MRN: [id number]  ORDERING CLINICIAN: Andrey Spearman, MD   TECHNOLOGIST: Oneita Jolly ELECTROMYOGRAPHER: Earlean Polka. Annett Boxwell, MD  CLINICAL INFORMATION: 83 year old female with bilateral hand numbness and tingling.   FINDINGS: NERVE CONDUCTION STUDY: Bilateral median motor responses could not be obtained.  Bilateral ulnar motor responses are normal.  Bilateral median sensory responses could not be obtained.  Bilateral ulnar sensory responses are normal.   NEEDLE ELECTROMYOGRAPHY:  Needle examination of left upper extremity deltoid, biceps, triceps, flexor carpi radialis, first dorsal interosseous is normal.   IMPRESSION:   Abnormal study demonstrating: - Severe bilateral neuropathies at the wrists consistent with severe bilateral carpal tunnel syndrome.      INTERPRETING PHYSICIAN:  Penni Bombard, MD Certified in Neurology, Neurophysiology and Neuroimaging  [facility] [address], [place],  [address] [phone]  [facility]    Nerve / Sites Muscle Latency Ref. Amplitude Ref. Rel Amp Segments Distance Velocity Ref. Area    ms ms mV mV %  cm m/s m/s mVms  R Median - APB     Wrist APB NR ?4.4 NR ?4.0 NR Wrist - APB 7   NR  L Median - APB     Wrist APB NR ?4.4 NR ?4.0 NR Wrist - APB 7   NR  R Ulnar - ADM     Wrist ADM 2.7 ?3.3 6.9 ?6.0 100 Wrist - ADM 7   28.8     B.Elbow ADM 6.4  5.9  85.6 B.Elbow - Wrist 18 49 ?49 25.1     A.Elbow ADM 8.9  5.6  95.2 A.Elbow - B.Elbow 12 49 ?49 24.6         A.Elbow - Wrist      L Ulnar - ADM     Wrist ADM 2.7 ?3.3 7.1 ?6.0 100 Wrist - ADM 7   27.0     B.Elbow ADM 6.1  6.3  88.7 B.Elbow - Wrist 18 52 ?49 26.8     A.Elbow ADM 8.4  6.4  101 A.Elbow - B.Elbow 12 52 ?49 26.6         A.Elbow - Wrist                 SNC      Nerve / Sites Rec. Site Peak Lat Ref.  Amp Ref. Segments Distance    ms ms V V  cm  R Median - Orthodromic (Dig II, Mid palm)     Dig II Wrist NR ?3.4 NR ?10 Dig II - Wrist 13  L Median - Orthodromic (Dig II, Mid palm)     Dig II Wrist NR ?3.4 NR ?10 Dig II - Wrist 13  R Ulnar - Orthodromic, (Dig V, Mid palm)     Dig V Wrist 2.9 ?3.1 6 ?5 Dig V - Wrist 11  L Ulnar - Orthodromic, (Dig V, Mid palm)     Dig V Wrist 2.9 ?3.1 6 ?5 Dig V - Wrist 9             F  Wave    Nerve F Lat Ref.   ms ms  R Ulnar - ADM 27.9 ?32.0  L Ulnar - ADM 26.7 ?32.0         EMG full       EMG Summary Table    Spontaneous MUAP Recruitment  Muscle IA Fib PSW Fasc Other Amp Dur. Poly Pattern  L. Deltoid Normal None None None _______ Normal Normal Normal Normal  L. Biceps brachii Normal None None None _______ Normal Normal Normal Normal  L. Triceps brachii Normal None None None _______ Normal Normal Normal Normal  L. Flexor carpi radialis Normal None None None _______ Normal Normal Normal Normal  L. First dorsal interosseous Normal None None None _______ Normal Normal Normal Normal

## 2017-05-19 ENCOUNTER — Other Ambulatory Visit: Payer: Self-pay | Admitting: Diagnostic Neuroimaging

## 2017-05-19 DIAGNOSIS — G5603 Carpal tunnel syndrome, bilateral upper limbs: Secondary | ICD-10-CM

## 2017-05-27 DIAGNOSIS — M79642 Pain in left hand: Secondary | ICD-10-CM | POA: Diagnosis not present

## 2017-05-27 DIAGNOSIS — M79641 Pain in right hand: Secondary | ICD-10-CM | POA: Diagnosis not present

## 2017-05-27 DIAGNOSIS — G5603 Carpal tunnel syndrome, bilateral upper limbs: Secondary | ICD-10-CM | POA: Diagnosis not present

## 2017-05-28 ENCOUNTER — Other Ambulatory Visit: Payer: Self-pay | Admitting: Orthopedic Surgery

## 2017-06-04 ENCOUNTER — Encounter (HOSPITAL_BASED_OUTPATIENT_CLINIC_OR_DEPARTMENT_OTHER): Payer: Self-pay | Admitting: *Deleted

## 2017-06-04 ENCOUNTER — Other Ambulatory Visit: Payer: Self-pay

## 2017-06-05 ENCOUNTER — Telehealth: Payer: Self-pay | Admitting: Family Medicine

## 2017-06-05 ENCOUNTER — Encounter (HOSPITAL_BASED_OUTPATIENT_CLINIC_OR_DEPARTMENT_OTHER)
Admission: RE | Admit: 2017-06-05 | Discharge: 2017-06-05 | Disposition: A | Discharge status: Home / Self Care | Payer: Medicare HMO | Source: Ambulatory Visit | Attending: Orthopedic Surgery | Admitting: Orthopedic Surgery

## 2017-06-05 DIAGNOSIS — N289 Disorder of kidney and ureter, unspecified: Secondary | ICD-10-CM | POA: Diagnosis not present

## 2017-06-05 DIAGNOSIS — Z96641 Presence of right artificial hip joint: Secondary | ICD-10-CM | POA: Diagnosis not present

## 2017-06-05 DIAGNOSIS — E119 Type 2 diabetes mellitus without complications: Secondary | ICD-10-CM | POA: Diagnosis not present

## 2017-06-05 DIAGNOSIS — K219 Gastro-esophageal reflux disease without esophagitis: Secondary | ICD-10-CM | POA: Diagnosis not present

## 2017-06-05 DIAGNOSIS — M1990 Unspecified osteoarthritis, unspecified site: Secondary | ICD-10-CM | POA: Diagnosis not present

## 2017-06-05 DIAGNOSIS — Z79899 Other long term (current) drug therapy: Secondary | ICD-10-CM | POA: Diagnosis not present

## 2017-06-05 DIAGNOSIS — E039 Hypothyroidism, unspecified: Secondary | ICD-10-CM | POA: Diagnosis not present

## 2017-06-05 DIAGNOSIS — E785 Hyperlipidemia, unspecified: Secondary | ICD-10-CM | POA: Diagnosis not present

## 2017-06-05 DIAGNOSIS — Z888 Allergy status to other drugs, medicaments and biological substances status: Secondary | ICD-10-CM | POA: Diagnosis not present

## 2017-06-05 DIAGNOSIS — Z7982 Long term (current) use of aspirin: Secondary | ICD-10-CM | POA: Diagnosis not present

## 2017-06-05 DIAGNOSIS — Z7984 Long term (current) use of oral hypoglycemic drugs: Secondary | ICD-10-CM | POA: Diagnosis not present

## 2017-06-05 DIAGNOSIS — G5601 Carpal tunnel syndrome, right upper limb: Secondary | ICD-10-CM | POA: Diagnosis not present

## 2017-06-05 DIAGNOSIS — I10 Essential (primary) hypertension: Secondary | ICD-10-CM | POA: Diagnosis not present

## 2017-06-05 DIAGNOSIS — Z85828 Personal history of other malignant neoplasm of skin: Secondary | ICD-10-CM | POA: Diagnosis not present

## 2017-06-05 LAB — BASIC METABOLIC PANEL
ANION GAP: 5 (ref 5–15)
BUN: 27 mg/dL — ABNORMAL HIGH (ref 6–20)
CALCIUM: 9.5 mg/dL (ref 8.9–10.3)
CO2: 24 mmol/L (ref 22–32)
Chloride: 112 mmol/L — ABNORMAL HIGH (ref 101–111)
Creatinine, Ser: 1.21 mg/dL — ABNORMAL HIGH (ref 0.44–1.00)
GFR, EST AFRICAN AMERICAN: 46 mL/min — AB (ref >60)
GFR, EST NON AFRICAN AMERICAN: 39 mL/min — AB (ref >60)
GLUCOSE: 143 mg/dL — AB (ref 65–99)
POTASSIUM: 3.9 mmol/L (ref 3.5–5.1)
Sodium: 141 mmol/L (ref 135–145)

## 2017-06-05 NOTE — Progress Notes (Signed)
Pt in for labs and EKG. Dr. Marcell Barlow reviewed EKG, will proceed with surgery as scheduled.

## 2017-06-05 NOTE — Telephone Encounter (Signed)
Note done-in IN box

## 2017-06-05 NOTE — Telephone Encounter (Signed)
Copied from [place] #[id number]. >> Jun 05, 2017  4:58 PM Neva Seat wrote: Pt's daughter in law is calling for Pt. Regarding a carpel tunnel surgery for the pt in the morning at 11:00. Am.  [employer] insurance will not cover the surgery until there is proof of the pt wearing hand splint that Dr. Glori Bickers instructed her to do.  Documented poof can be faxed to [facility] at the [facility] - Dr. Wynonia Sours is the surgeon.  Fax [phone]

## 2017-06-05 NOTE — Telephone Encounter (Signed)
Letter faxed and daughter n law Blanch Media notified

## 2017-06-06 ENCOUNTER — Other Ambulatory Visit: Payer: Self-pay

## 2017-06-06 ENCOUNTER — Ambulatory Visit (HOSPITAL_BASED_OUTPATIENT_CLINIC_OR_DEPARTMENT_OTHER)
Admission: RE | Admit: 2017-06-06 | Discharge: 2017-06-06 | Disposition: A | Discharge status: Home / Self Care | Payer: Medicare HMO | Source: Ambulatory Visit | Attending: Orthopedic Surgery | Admitting: Orthopedic Surgery

## 2017-06-06 ENCOUNTER — Encounter (HOSPITAL_BASED_OUTPATIENT_CLINIC_OR_DEPARTMENT_OTHER)
Admission: RE | Disposition: A | Discharge status: Home / Self Care | Payer: Self-pay | Source: Ambulatory Visit | Attending: Orthopedic Surgery

## 2017-06-06 ENCOUNTER — Encounter (HOSPITAL_BASED_OUTPATIENT_CLINIC_OR_DEPARTMENT_OTHER): Payer: Self-pay

## 2017-06-06 ENCOUNTER — Ambulatory Visit (HOSPITAL_BASED_OUTPATIENT_CLINIC_OR_DEPARTMENT_OTHER): Payer: Medicare HMO | Admitting: Certified Registered"

## 2017-06-06 DIAGNOSIS — M1990 Unspecified osteoarthritis, unspecified site: Secondary | ICD-10-CM | POA: Diagnosis not present

## 2017-06-06 DIAGNOSIS — K219 Gastro-esophageal reflux disease without esophagitis: Secondary | ICD-10-CM | POA: Diagnosis not present

## 2017-06-06 DIAGNOSIS — Z888 Allergy status to other drugs, medicaments and biological substances status: Secondary | ICD-10-CM | POA: Insufficient documentation

## 2017-06-06 DIAGNOSIS — Z7982 Long term (current) use of aspirin: Secondary | ICD-10-CM | POA: Insufficient documentation

## 2017-06-06 DIAGNOSIS — G5601 Carpal tunnel syndrome, right upper limb: Secondary | ICD-10-CM | POA: Diagnosis not present

## 2017-06-06 DIAGNOSIS — M545 Low back pain: Secondary | ICD-10-CM | POA: Diagnosis not present

## 2017-06-06 DIAGNOSIS — I10 Essential (primary) hypertension: Secondary | ICD-10-CM | POA: Diagnosis not present

## 2017-06-06 DIAGNOSIS — D696 Thrombocytopenia, unspecified: Secondary | ICD-10-CM | POA: Diagnosis not present

## 2017-06-06 DIAGNOSIS — E039 Hypothyroidism, unspecified: Secondary | ICD-10-CM | POA: Diagnosis not present

## 2017-06-06 DIAGNOSIS — N289 Disorder of kidney and ureter, unspecified: Secondary | ICD-10-CM | POA: Diagnosis not present

## 2017-06-06 DIAGNOSIS — E785 Hyperlipidemia, unspecified: Secondary | ICD-10-CM | POA: Insufficient documentation

## 2017-06-06 DIAGNOSIS — Z85828 Personal history of other malignant neoplasm of skin: Secondary | ICD-10-CM | POA: Insufficient documentation

## 2017-06-06 DIAGNOSIS — Z7984 Long term (current) use of oral hypoglycemic drugs: Secondary | ICD-10-CM | POA: Insufficient documentation

## 2017-06-06 DIAGNOSIS — E119 Type 2 diabetes mellitus without complications: Secondary | ICD-10-CM | POA: Insufficient documentation

## 2017-06-06 DIAGNOSIS — Z96641 Presence of right artificial hip joint: Secondary | ICD-10-CM | POA: Insufficient documentation

## 2017-06-06 DIAGNOSIS — Z79899 Other long term (current) drug therapy: Secondary | ICD-10-CM | POA: Insufficient documentation

## 2017-06-06 HISTORY — PX: CARPAL TUNNEL RELEASE: SHX101

## 2017-06-06 LAB — GLUCOSE, CAPILLARY: GLUCOSE-CAPILLARY: 110 mg/dL — AB (ref 65–99)

## 2017-06-06 SURGERY — CARPAL TUNNEL RELEASE
Anesthesia: Monitor Anesthesia Care | Site: Wrist | Laterality: Right

## 2017-06-06 MED ORDER — ONDANSETRON HCL 4 MG/2ML IJ SOLN: 4.0000 mg | Freq: Once | INTRAMUSCULAR | Status: DC | PRN

## 2017-06-06 MED ORDER — CEFAZOLIN SODIUM-DEXTROSE 2-4 GM/100ML-% IV SOLN
2.0000 g | INTRAVENOUS | Status: AC
  Administered 2017-06-06: 2 g via INTRAVENOUS

## 2017-06-06 MED ORDER — LACTATED RINGERS IV SOLN
INTRAVENOUS | Status: DC
  Administered 2017-06-06: 14:00:00 via INTRAVENOUS

## 2017-06-06 MED ORDER — FENTANYL CITRATE (PF) 100 MCG/2ML IJ SOLN: 25.0000 ug | INTRAMUSCULAR | Status: DC | PRN

## 2017-06-06 MED ORDER — FENTANYL CITRATE (PF) 100 MCG/2ML IJ SOLN
50.0000 ug | INTRAMUSCULAR | Status: DC | PRN
  Administered 2017-06-06: 25 ug via INTRAVENOUS

## 2017-06-06 MED ORDER — SCOPOLAMINE 1 MG/3DAYS TD PT72: 1.0000 | MEDICATED_PATCH | Freq: Once | TRANSDERMAL | Status: DC | PRN

## 2017-06-06 MED ORDER — HYDROCODONE-ACETAMINOPHEN 5-325 MG PO TABS: 1.0000 | ORAL_TABLET | Freq: Four times a day (QID) | ORAL | 0 refills | Status: DC | PRN

## 2017-06-06 MED ORDER — BUPIVACAINE HCL (PF) 0.25 % IJ SOLN
INTRAMUSCULAR | Status: DC | PRN
  Administered 2017-06-06: 7 mL

## 2017-06-06 MED ORDER — CHLORHEXIDINE GLUCONATE 4 % EX LIQD: 60.0000 mL | Freq: Once | CUTANEOUS | Status: DC

## 2017-06-06 MED ORDER — ONDANSETRON HCL 4 MG/2ML IJ SOLN
INTRAMUSCULAR | Status: AC
  Filled 2017-06-06: qty 2

## 2017-06-06 MED ORDER — CEFAZOLIN SODIUM-DEXTROSE 2-4 GM/100ML-% IV SOLN
INTRAVENOUS | Status: AC
  Filled 2017-06-06: qty 100

## 2017-06-06 MED ORDER — LIDOCAINE HCL (PF) 0.5 % IJ SOLN
INTRAMUSCULAR | Status: DC | PRN
  Administered 2017-06-06: 25 mL via INTRAVENOUS

## 2017-06-06 MED ORDER — MIDAZOLAM HCL 2 MG/2ML IJ SOLN: 1.0000 mg | INTRAMUSCULAR | Status: DC | PRN

## 2017-06-06 MED ORDER — FENTANYL CITRATE (PF) 100 MCG/2ML IJ SOLN
INTRAMUSCULAR | Status: AC
  Filled 2017-06-06: qty 2

## 2017-06-06 MED ORDER — PROPOFOL 500 MG/50ML IV EMUL
INTRAVENOUS | Status: AC
  Filled 2017-06-06: qty 50

## 2017-06-06 MED ORDER — ONDANSETRON HCL 4 MG/2ML IJ SOLN
INTRAMUSCULAR | Status: DC | PRN
  Administered 2017-06-06: 4 mg via INTRAVENOUS

## 2017-06-06 MED ORDER — PROPOFOL 500 MG/50ML IV EMUL
INTRAVENOUS | Status: DC | PRN
  Administered 2017-06-06: 50 ug/kg/min via INTRAVENOUS

## 2017-06-06 SURGICAL SUPPLY — 31 items
BLADE SURG 15 STRL LF DISP TIS (BLADE) ×1 IMPLANT
BLADE SURG 15 STRL SS (BLADE) ×2
BNDG COHESIVE 3X5 TAN STRL LF (GAUZE/BANDAGES/DRESSINGS) ×3 IMPLANT
BNDG ESMARK 4X9 LF (GAUZE/BANDAGES/DRESSINGS) IMPLANT
BNDG GAUZE ELAST 4 BULKY (GAUZE/BANDAGES/DRESSINGS) ×3 IMPLANT
CHLORAPREP W/TINT 26ML (MISCELLANEOUS) ×3 IMPLANT
CORD BIPOLAR FORCEPS 12FT (ELECTRODE) ×3 IMPLANT
COVER BACK TABLE 60X90IN (DRAPES) ×3 IMPLANT
COVER MAYO STAND STRL (DRAPES) ×3 IMPLANT
CUFF TOURNIQUET SINGLE 18IN (TOURNIQUET CUFF) ×3 IMPLANT
DRAPE EXTREMITY T 121X128X90 (DRAPE) ×3 IMPLANT
DRAPE SURG 17X23 STRL (DRAPES) ×3 IMPLANT
DRSG PAD ABDOMINAL 8X10 ST (GAUZE/BANDAGES/DRESSINGS) ×3 IMPLANT
GAUZE SPONGE 4X4 12PLY STRL (GAUZE/BANDAGES/DRESSINGS) ×3 IMPLANT
GAUZE XEROFORM 1X8 LF (GAUZE/BANDAGES/DRESSINGS) ×3 IMPLANT
GLOVE BIOGEL PI IND STRL 8.5 (GLOVE) ×1 IMPLANT
GLOVE BIOGEL PI INDICATOR 8.5 (GLOVE) ×2
GLOVE SURG ORTHO 8.0 STRL STRW (GLOVE) ×3 IMPLANT
GOWN STRL REUS W/ TWL LRG LVL3 (GOWN DISPOSABLE) ×1 IMPLANT
GOWN STRL REUS W/TWL LRG LVL3 (GOWN DISPOSABLE) ×2
GOWN STRL REUS W/TWL XL LVL3 (GOWN DISPOSABLE) ×3 IMPLANT
NEEDLE PRECISIONGLIDE 27X1.5 (NEEDLE) IMPLANT
NS IRRIG 1000ML POUR BTL (IV SOLUTION) ×3 IMPLANT
PACK BASIN DAY SURGERY FS (CUSTOM PROCEDURE TRAY) ×3 IMPLANT
STOCKINETTE 4X48 STRL (DRAPES) ×3 IMPLANT
SUT ETHILON 4 0 PS 2 18 (SUTURE) ×3 IMPLANT
SUT VICRYL 4-0 PS2 18IN ABS (SUTURE) IMPLANT
SYR BULB 3OZ (MISCELLANEOUS) ×3 IMPLANT
SYR CONTROL 10ML LL (SYRINGE) IMPLANT
TOWEL OR 17X24 6PK STRL BLUE (TOWEL DISPOSABLE) ×3 IMPLANT
UNDERPAD 30X30 (UNDERPADS AND DIAPERS) ×3 IMPLANT

## 2017-06-06 NOTE — Anesthesia Preprocedure Evaluation (Signed)
Anesthesia Evaluation  Patient identified by MRN, date of birth, ID band Patient awake    Reviewed: Allergy & Precautions, NPO status , Patient's Chart, lab work & pertinent test results, reviewed documented beta blocker date and time   Airway Mallampati: II  TM Distance: >3 FB Neck ROM: Full    Dental  (+) Dental Advisory Given, Edentulous Upper   Pulmonary neg pulmonary ROS,    Pulmonary exam normal breath sounds clear to auscultation       Cardiovascular hypertension, Pt. on home beta blockers and Pt. on medications Normal cardiovascular exam Rhythm:Regular Rate:Normal     Neuro/Psych negative neurological ROS     GI/Hepatic Neg liver ROS, GERD  Medicated,  Endo/Other  diabetes, Type 2, Oral Hypoglycemic AgentsHypothyroidism   Renal/GU Renal InsufficiencyRenal disease     Musculoskeletal  (+) Arthritis , Osteoarthritis,    Abdominal   Peds  Hematology negative hematology ROS (+)   Anesthesia Other Findings Day of surgery medications reviewed with the patient.  Reproductive/Obstetrics                             Anesthesia Physical Anesthesia Plan  ASA: III  Anesthesia Plan: Bier Block and MAC and Bier Block-LIDOCAINE ONLY   Post-op Pain Management:    Induction: Intravenous  PONV Risk Score and Plan: 2 and Ondansetron, Treatment may vary due to age or medical condition and Propofol infusion  Airway Management Planned: Nasal Cannula  Additional Equipment:   Intra-op Plan:   Post-operative Plan:   Informed Consent: I have reviewed the patients History and Physical, chart, labs and discussed the procedure including the risks, benefits and alternatives for the proposed anesthesia with the patient or authorized representative who has indicated his/her understanding and acceptance.   Dental advisory given  Plan Discussed with:   Anesthesia Plan Comments: (Risks/benefits of  regional block discussed with patient including risk of bleeding, infection, nerve damage, and possibility of failed block.  Also discussed backup plan of general anesthesia and associated risks.  Patient wishes to proceed.)        Anesthesia Quick Evaluation

## 2017-06-06 NOTE — Brief Op Note (Signed)
06/06/2017  2:29 PM  PATIENT:  Stacey Harris  83 y.o. female  PRE-OPERATIVE DIAGNOSIS:  RIGHT CARPAL TUNNEL SYNDROME  POST-OPERATIVE DIAGNOSIS:  RIGHT CARPAL TUNNEL SYNDROME  PROCEDURE:  Procedure(s): RIGHT CARPAL TUNNEL RELEASE (Right)  SURGEON:  Surgeon(s) and Role:    * Daryll Brod, MD - Primary  PHYSICIAN ASSISTANT:   ASSISTANTS: none   ANESTHESIA:   local and regional  EBL: none BLOOD ADMINISTERED:none  DRAINS: none   LOCAL MEDICATIONS USED:  BUPIVICAINE   SPECIMEN:  No Specimen  DISPOSITION OF SPECIMEN:  N/A  COUNTS:  YES  TOURNIQUET:  * Missing tourniquet times found for documented tourniquets in log: [id number] *  DICTATION: .Other Dictation: Dictation Number [id number]  PLAN OF CARE: Discharge to home after PACU  PATIENT DISPOSITION:  PACU - hemodynamically stable.

## 2017-06-06 NOTE — Discharge Instructions (Addendum)

## 2017-06-06 NOTE — Op Note (Signed)
Dictation Number [id number]

## 2017-06-06 NOTE — Anesthesia Postprocedure Evaluation (Signed)
Anesthesia Post Note  Patient: Stacey Harris  Procedure(s) Performed: RIGHT CARPAL TUNNEL RELEASE (Right Wrist)     Patient location during evaluation: PACU Anesthesia Type: MAC and Bier Block Level of consciousness: awake and alert Pain management: pain level controlled Vital Signs Assessment: post-procedure vital signs reviewed and stable Respiratory status: spontaneous breathing, nonlabored ventilation and respiratory function stable Cardiovascular status: stable and blood pressure returned to baseline Postop Assessment: no apparent nausea or vomiting Anesthetic complications: no    Last Vitals:  Vitals:   06/06/17 1500 06/06/17 1515  BP: (!) 179/70 93/64  Pulse: (!) 56 (!) 56  Resp: 19 14  Temp:    SpO2: 100% 100%    Last Pain:  Vitals:   06/06/17 1515  TempSrc:   PainSc: 0-No pain                 Catalina Gravel

## 2017-06-06 NOTE — Op Note (Signed)
NAMELYNNIAH, JANOSKI                ACCOUNT NO.:  [id number]  MEDICAL RECORD NO.:  [id number]  LOCATION:                                 FACILITY:  PHYSICIAN:  Daryll Brod, M.D.            DATE OF BIRTH:  DATE OF PROCEDURE:  06/06/2017 DATE OF DISCHARGE:                              OPERATIVE REPORT   PREOPERATIVE DIAGNOSIS:  Carpal tunnel syndrome, right hand.  POSTOPERATIVE DIAGNOSIS:  Carpal tunnel syndrome, right hand.  OPERATION:  Decompression, right median nerve.  SURGEON:  Daryll Brod, MD.  ASSISTANT:  None.  ANESTHESIA:  Forearm-based IV regional with local infiltration.  PLACE OF SURGERY:  [facility].  HISTORY:  The patient is an 83 year old female with carpal tunnel syndrome, constant numbness and tingling.  EMG, nerve conduction is positive.  She would like to have this surgically released in order to see if she could regain sensibility.  Pre, peri, and postoperative course have been discussed along with risks and complications.  She is aware that there is no guarantee to the surgery; the possibility of infection; recurrence of injury to arteries, nerves, tendons; incomplete relief of symptoms; and dystrophy.  In the preoperative area, the patient is seen, the extremity marked by both patient and surgeon. Antibiotic given.  DESCRIPTION OF PROCEDURE:  The patient was brought to the operating room, where a forearm-based IV regional anesthetic was carried out without difficulty under the direction of the Anesthesia Department. She was prepped using ChloraPrep in supine position with the right arm free.  A 3-minute dry time was allowed and a time-out taken, confirming the patient and procedure.  A longitudinal incision was made in the right palm, carried down through subcutaneous tissue.  Bleeders were electrocauterized with bipolar.  The palmar fascia was split. Superficial palmar arch was identified.  The flexor tendon to the ring and little finger was  identified.  The flexor tendons median nerve was retracted radially.  The flexor retinaculum was then released on its ulnar border.  A right angle and Sewell retractor were placed between skin and forearm fascia.  The fascia was released for approximately a centimeter and a half proximal to the wrist crease under direct vision. The canal was explored.  An area of compression to the nerve was immediately apparent.  No further lesions were identified.  The wound was copiously irrigated with saline.  The skin was closed with interrupted 4-0 nylon sutures.  A local infiltration with 0.25% bupivacaine without epinephrine was given.  Approximately 7 mL was used. A sterile compressive dressing with the fingers free was applied.  On deflation of the tourniquet, all fingers pinked.  She was taken to the recovery room for observation in satisfactory condition. She will be discharged to home to return to the [facility] in 1 week, on Nor[place].          ______________________________ Daryll Brod, M.D.     GK/MEDQ  D:  06/06/2017  T:  06/06/2017  Job:  [id number]

## 2017-06-06 NOTE — Transfer of Care (Signed)
Immediate Anesthesia Transfer of Care Note  Patient: Stacey Harris  Procedure(s) Performed: RIGHT CARPAL TUNNEL RELEASE (Right Wrist)  Patient Location: PACU  Anesthesia Type:MAC  Level of Consciousness: awake, alert  and oriented  Airway & Oxygen Therapy: Patient Spontanous Breathing and Patient connected to face mask oxygen  Post-op Assessment: Report given to RN and Post -op Vital signs reviewed and stable  Post vital signs: Reviewed and stable  Last Vitals:  Vitals:   06/06/17 1310  BP: (!) 171/59  Resp: 18  Temp: 36.8 C  SpO2: 100%    Last Pain:  Vitals:   06/06/17 1310  TempSrc: Oral         Complications: No apparent anesthesia complications
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
## 2017-06-07 ENCOUNTER — Encounter (HOSPITAL_BASED_OUTPATIENT_CLINIC_OR_DEPARTMENT_OTHER): Payer: Self-pay | Admitting: Orthopedic Surgery

## 2017-06-07 NOTE — Addendum Note (Signed)
Addendum  created 06/07/17 2233 by Tawni Millers, CRNA   Charge Capture section accepted

## 2017-07-05 ENCOUNTER — Other Ambulatory Visit: Payer: Self-pay | Admitting: *Deleted

## 2017-07-05 MED ORDER — GLIMEPIRIDE 4 MG PO TABS: ORAL_TABLET | ORAL | 3 refills | Status: DC

## 2017-07-05 MED ORDER — ROSUVASTATIN CALCIUM 10 MG PO TABS: 10.0000 mg | ORAL_TABLET | Freq: Every day | ORAL | 3 refills | Status: DC

## 2017-07-05 MED ORDER — METOPROLOL SUCCINATE ER 100 MG PO TB24: 100.0000 mg | ORAL_TABLET | Freq: Every day | ORAL | 3 refills | Status: DC

## 2017-07-05 MED ORDER — HYDROCHLOROTHIAZIDE 25 MG PO TABS: 25.0000 mg | ORAL_TABLET | Freq: Every morning | ORAL | 3 refills | Status: DC

## 2017-07-05 NOTE — Telephone Encounter (Signed)
Received refill request on a few of pt's medications, one of them was for rosuvastatin but it looks like that med was d/c at hospital on 06/06/17, ? If pt needs to be on the medication, please advise (pt has a f/u and her CPE scheduled for next year)

## 2017-07-05 NOTE — Telephone Encounter (Signed)
done

## 2017-07-05 NOTE — Telephone Encounter (Signed)
Please refill all  The rosuvastatin is ok to fill (I don't see why it was held) if she does want to get back on it  thanks

## 2017-07-09 HISTORY — PX: CATARACT EXTRACTION W/ INTRAOCULAR LENS  IMPLANT, BILATERAL: SHX1307

## 2017-08-11 ENCOUNTER — Telehealth: Payer: Self-pay | Admitting: Family Medicine

## 2017-08-11 DIAGNOSIS — I10 Essential (primary) hypertension: Secondary | ICD-10-CM

## 2017-08-11 DIAGNOSIS — E7800 Pure hypercholesterolemia, unspecified: Secondary | ICD-10-CM

## 2017-08-11 DIAGNOSIS — E1143 Type 2 diabetes mellitus with diabetic autonomic (poly)neuropathy: Secondary | ICD-10-CM

## 2017-08-11 DIAGNOSIS — D696 Thrombocytopenia, unspecified: Secondary | ICD-10-CM

## 2017-08-11 NOTE — Telephone Encounter (Signed)
-----   Message from Ellamae Sia sent at 08/06/2017  3:22 PM EST ----- Regarding: Lab orders for Monday, 2.4.19 Lab orders for a 6 month follow up appt

## 2017-08-12 ENCOUNTER — Other Ambulatory Visit (INDEPENDENT_AMBULATORY_CARE_PROVIDER_SITE_OTHER): Payer: Medicare HMO

## 2017-08-12 DIAGNOSIS — D696 Thrombocytopenia, unspecified: Secondary | ICD-10-CM

## 2017-08-12 DIAGNOSIS — E1143 Type 2 diabetes mellitus with diabetic autonomic (poly)neuropathy: Secondary | ICD-10-CM | POA: Diagnosis not present

## 2017-08-12 DIAGNOSIS — E7800 Pure hypercholesterolemia, unspecified: Secondary | ICD-10-CM | POA: Diagnosis not present

## 2017-08-12 DIAGNOSIS — I10 Essential (primary) hypertension: Secondary | ICD-10-CM | POA: Diagnosis not present

## 2017-08-12 LAB — CBC WITH DIFFERENTIAL/PLATELET
BASOS ABS: 0.1 10*3/uL (ref 0.0–0.1)
Basophils Relative: 1.5 % (ref 0.0–3.0)
EOS ABS: 0.2 10*3/uL (ref 0.0–0.7)
Eosinophils Relative: 3.8 % (ref 0.0–5.0)
HEMATOCRIT: 31.6 % — AB (ref 36.0–46.0)
Hemoglobin: 10.6 g/dL — ABNORMAL LOW (ref 12.0–15.0)
LYMPHS PCT: 35.1 % (ref 12.0–46.0)
Lymphs Abs: 1.7 10*3/uL (ref 0.7–4.0)
MCHC: 33.4 g/dL (ref 30.0–36.0)
MCV: 100.5 fl — ABNORMAL HIGH (ref 78.0–100.0)
Monocytes Absolute: 0.4 10*3/uL (ref 0.1–1.0)
Monocytes Relative: 8.8 % (ref 3.0–12.0)
NEUTROS ABS: 2.4 10*3/uL (ref 1.4–7.7)
Neutrophils Relative %: 50.8 % (ref 43.0–77.0)
PLATELETS: 91 10*3/uL — AB (ref 150.0–400.0)
RBC: 3.14 Mil/uL — AB (ref 3.87–5.11)
RDW: 15.4 % (ref 11.5–15.5)
WBC: 4.8 10*3/uL (ref 4.0–10.5)

## 2017-08-12 LAB — COMPREHENSIVE METABOLIC PANEL
ALBUMIN: 3.3 g/dL — AB (ref 3.5–5.2)
ALT: 19 U/L (ref 0–35)
AST: 29 U/L (ref 0–37)
Alkaline Phosphatase: 69 U/L (ref 39–117)
BILIRUBIN TOTAL: 0.9 mg/dL (ref 0.2–1.2)
BUN: 30 mg/dL — ABNORMAL HIGH (ref 6–23)
CALCIUM: 9.8 mg/dL (ref 8.4–10.5)
CHLORIDE: 109 meq/L (ref 96–112)
CO2: 25 mEq/L (ref 19–32)
CREATININE: 1.18 mg/dL (ref 0.40–1.20)
GFR: 46.13 mL/min — ABNORMAL LOW (ref >60.00)
Glucose, Bld: 154 mg/dL — ABNORMAL HIGH (ref 70–99)
Potassium: 4.3 mEq/L (ref 3.5–5.1)
Sodium: 142 mEq/L (ref 135–145)
Total Protein: 6.6 g/dL (ref 6.0–8.3)

## 2017-08-12 LAB — LIPID PANEL
CHOL/HDL RATIO: 3
Cholesterol: 124 mg/dL (ref 0–200)
HDL: 39.4 mg/dL (ref >39.00)
LDL Cholesterol: 67 mg/dL (ref 0–99)
NonHDL: 84.51
TRIGLYCERIDES: 86 mg/dL (ref 0.0–149.0)
VLDL: 17.2 mg/dL (ref 0.0–40.0)

## 2017-08-12 LAB — HEMOGLOBIN A1C: Hgb A1c MFr Bld: 7.1 % — ABNORMAL HIGH (ref 4.6–6.5)

## 2017-08-16 ENCOUNTER — Encounter: Payer: Self-pay | Admitting: Family Medicine

## 2017-08-16 ENCOUNTER — Ambulatory Visit: Payer: Medicare HMO | Admitting: Family Medicine

## 2017-08-16 VITALS — BP 136/78 | HR 59 | Temp 97.9°F | Ht 62.0 in | Wt 159.5 lb

## 2017-08-16 DIAGNOSIS — D469 Myelodysplastic syndrome, unspecified: Secondary | ICD-10-CM

## 2017-08-16 DIAGNOSIS — E1143 Type 2 diabetes mellitus with diabetic autonomic (poly)neuropathy: Secondary | ICD-10-CM | POA: Diagnosis not present

## 2017-08-16 DIAGNOSIS — I10 Essential (primary) hypertension: Secondary | ICD-10-CM | POA: Diagnosis not present

## 2017-08-16 DIAGNOSIS — E7800 Pure hypercholesterolemia, unspecified: Secondary | ICD-10-CM | POA: Diagnosis not present

## 2017-08-16 DIAGNOSIS — K219 Gastro-esophageal reflux disease without esophagitis: Secondary | ICD-10-CM

## 2017-08-16 DIAGNOSIS — D696 Thrombocytopenia, unspecified: Secondary | ICD-10-CM

## 2017-08-16 MED ORDER — OMEPRAZOLE 20 MG PO CPDR: 20.0000 mg | DELAYED_RELEASE_CAPSULE | Freq: Every day | ORAL | 11 refills | Status: DC

## 2017-08-16 NOTE — Progress Notes (Signed)
Subjective:    Patient ID: Stacey Harris, female    DOB: May 02, 1929, 84 y.o.   MRN: [id number]  HPI Here for f/u of chronic medical problems   Had carpal tunnel surgery  Improved but not better totally Needs other hand operated on   Otherwise doing fair   Still nausea in am - she is taking zantac  Still has heartburn as well - all night   Went to the ear doctor and got hearing aides  Not helping a lot   Hypothyroidism  Pt has no clinical changes No change in energy level/ hair or skin/ edema and no tremor Lab Results  Component Value Date   TSH 2.27 02/04/2017        Wt Readings from Last 3 Encounters:  08/16/17 159 lb 8 oz (72.3 kg)  06/06/17 159 lb (72.1 kg)  04/26/17 158 lb 12.8 oz (72 kg)  stable  Appetite stays good  29.17 kg/m   bp is stable today  No cp or palpitations or headaches or edema  No side effects to medicines  BP Readings from Last 3 Encounters:  08/16/17 136/78  06/06/17 (!) 161/76  04/26/17 (!) 186/83     DM2  Lab Results  Component Value Date   HGBA1C 7.1 (H) 08/12/2017  this is up from 6.5- no steroids or steroid shots  She has been eating more sugar and carbs - folks were bringing her food/ still stays with her daughter and she feeds her  Cannot do much exercise with chronic back problems  amaryl Losartan  No low sugar readings   Hyperlipidemia Lab Results  Component Value Date   CHOL 124 08/12/2017   CHOL 99 02/04/2017   CHOL 101 08/20/2016   Lab Results  Component Value Date   HDL 39.40 08/12/2017   HDL 28.50 (L) 02/04/2017   HDL 35.90 (L) 08/20/2016   Lab Results  Component Value Date   LDLCALC 67 08/12/2017   LDLCALC 53 02/04/2017   LDLCALC 52 08/20/2016   Lab Results  Component Value Date   TRIG 86.0 08/12/2017   TRIG 86.0 02/04/2017   TRIG 66.0 08/20/2016   Lab Results  Component Value Date   CHOLHDL 3 08/12/2017   CHOLHDL 3 02/04/2017   CHOLHDL 3 08/20/2016   Lab Results  Component Value Date     LDLDIRECT 202.7 08/17/2010   crestor and diet   MDS Lab Results  Component Value Date   WBC 4.8 08/12/2017   HGB 10.6 (L) 08/12/2017   HCT 31.6 (L) 08/12/2017   MCV 100.5 (H) 08/12/2017   PLT 91.0 (L) 08/12/2017   seeing hematology Overall stable   Patient Active Problem List   Diagnosis Date Noted  . Paresthesia 02/11/2017  . History of fall 08/27/2016  . Controlled type 2 diabetes mellitus with diabetic autonomic neuropathy ([place]) 02/24/2016  . Routine general medical examination at a health care facility 02/12/2016  . MDS (myelodysplastic syndrome) ([place]) 10/24/2015  . Normocytic anemia 02/11/2015  . GERD (gastroesophageal reflux disease) 08/06/2014  . Encounter for Medicare annual wellness exam 08/03/2013  . Colon cancer screening 08/03/2013  . Thrombocytopenia ([place]) 08/03/2013  . Insomnia 01/26/2013  . Hypothyroid 09/17/2011  . Lumbar spinal stenosis 06/21/2011    Class: Present on Admission  . BACK PAIN, LUMBAR 08/23/2010  . POSTNASAL DRIP 08/23/2010  . OSTEOARTHRITIS, HIP 03/16/2009  . BASAL CELL CARCINOMA, FACE 07/01/2007  . BASAL CELL CARCINOMA, FACE 07/01/2007  . DIABETIC PERIPHERAL NEUROPATHY 09/24/2006  .  Hyperlipidemia 09/24/2006  . Essential hypertension 09/24/2006  . ROSACEA 09/24/2006  . INCONTINENCE, URGE 09/24/2006  . COLONOSCOPY AND REMOVAL OF LESION, HX OF 06/09/2003   Past Medical History:  Diagnosis Date  . Blood transfusion   . Cancer ([place])    basal cell skin CA  . Diabetes mellitus    type II  . Hyperlipidemia   . Hypertension   . Hypothyroidism   . OA (osteoarthritis)    Past Surgical History:  Procedure Laterality Date  . ANKLE FRACTURE SURGERY  10   rt  . CARPAL TUNNEL RELEASE Right 06/06/2017   Procedure: RIGHT CARPAL TUNNEL RELEASE;  Surgeon: Daryll Brod, MD;  Location: [facility];  Service: Orthopedics;  Laterality: Right;  . JOINT REPLACEMENT Right 2010   rt total hip 10  . LUMBAR LAMINECTOMY/DECOMPRESSION  MICRODISCECTOMY  06/22/2011   Procedure: LUMBAR LAMINECTOMY/DECOMPRESSION MICRODISCECTOMY;  Surgeon: Marybelle Killings;  Location: [facility];  Service: Orthopedics;  Laterality: N/A;  L3-4, L4-5 Decompression  . MOHS SURGERY  12/08   basal cell skin cancer lt temple  . TAH      and BSO, age 5  . TONSILLECTOMY     Social History   Tobacco Use  . Smoking status: Never Smoker  . Smokeless tobacco: Never Used  Substance Use Topics  . Alcohol use: No    Alcohol/week: 0.0 oz  . Drug use: No   Family History  Problem Relation Age of Onset  . Arthritis Father        RA  . Hypertension Father   . Heart disease Father        CAD  . Diabetes Father   . Arthritis Sister        RA  . Diabetes Brother   . Cancer Brother   . Diabetes Brother   . Cancer Brother   . Diabetes Brother   . Cancer Brother   . Diabetes Sister   . Cancer Brother    Allergies  Allergen Reactions  . Mavik [Trandolapril]     Cough    Current Outpatient Medications on File Prior to Visit  Medication Sig Dispense Refill  . aspirin 81 MG tablet Take 81 mg by mouth daily.      . Blood Glucose Calibration (ACCU-CHEK SMARTVIEW CONTROL) LIQD     . Blood Glucose Monitoring Suppl (ACCU-CHEK AVIVA PLUS) w/Device KIT Check blood sugar once daily and as directed.Dx E11.43 1 kit 0  . diazepam (VALIUM) 5 MG tablet Take 0.5 tablets (2.5 mg total) by mouth at bedtime as needed. 30 tablet 0  . glimepiride (AMARYL) 4 MG tablet TAKE 1 TABLET EVERY DAY BEFORE BREAKFAST 90 tablet 3  . glucose blood (ACCU-CHEK AVIVA PLUS) test strip Check blood sugar once daily and as directed.Dx E11.43 100 each 3  . hydrochlorothiazide (HYDRODIURIL) 25 MG tablet Take 1 tablet (25 mg total) by mouth every morning. 90 tablet 3  . HYDROcodone-acetaminophen (NORCO) 5-325 MG tablet Take 1 tablet by mouth every 6 (six) hours as needed. 20 tablet 0  . levothyroxine (SYNTHROID, LEVOTHROID) 50 MCG tablet Take 1 tablet (50 mcg total) by mouth daily. 15 tablet 0    . losartan (COZAAR) 50 MG tablet Take 1 tablet (50 mg total) by mouth daily. 15 tablet 0  . metFORMIN (GLUCOPHAGE) 1000 MG tablet Take 1 tablet (1,000 mg total) by mouth 2 (two) times daily with a meal. 180 tablet 0  . metoprolol succinate (TOPROL-XL) 100 MG 24 hr tablet Take 1  tablet (100 mg total) by mouth daily. Take with or immediately following a meal. 90 tablet 3  . Multiple Vitamin (MULTIVITAMIN) capsule Take 1 capsule by mouth daily.    . Omega-3 Fatty Acids (FISH OIL) 1200 MG CAPS Take 1,200 mg by mouth 2 (two) times daily.      . rosuvastatin (CRESTOR) 10 MG tablet Take 1 tablet (10 mg total) by mouth daily. 90 tablet 3   No current facility-administered medications on file prior to visit.     Review of Systems  Constitutional: Negative for activity change, appetite change, fatigue, fever and unexpected weight change.  HENT: Positive for hearing loss. Negative for congestion, ear pain, rhinorrhea, sinus pressure and sore throat.   Eyes: Negative for pain, redness and visual disturbance.  Respiratory: Negative for cough, shortness of breath and wheezing.   Cardiovascular: Negative for chest pain and palpitations.  Gastrointestinal: Positive for nausea. Negative for abdominal pain, blood in stool, constipation, diarrhea and vomiting.       Pos for heartburn  Endocrine: Negative for polydipsia and polyuria.  Genitourinary: Negative for dysuria, frequency and urgency.  Musculoskeletal: Negative for arthralgias, back pain and myalgias.  Skin: Negative for pallor and rash.  Allergic/Immunologic: Negative for environmental allergies.  Neurological: Negative for dizziness, syncope and headaches.  Hematological: Negative for adenopathy. Does not bruise/bleed easily.  Psychiatric/Behavioral: Negative for decreased concentration and dysphoric mood. The patient is not nervous/anxious.        Objective:   Physical Exam  Constitutional: She appears well-developed and well-nourished. No  distress.  obese and well appearing   HENT:  Head: Normocephalic and atraumatic.  Mouth/Throat: Oropharynx is clear and moist.  Eyes: Conjunctivae and EOM are normal. Pupils are equal, round, and reactive to light.  Neck: Normal range of motion. Neck supple. No JVD present. Carotid bruit is not present. No thyromegaly present.  Cardiovascular: Normal rate, regular rhythm, normal heart sounds and intact distal pulses. Exam reveals no gallop.  Pulmonary/Chest: Effort normal and breath sounds normal. No respiratory distress. She has no wheezes. She has no rales.  No crackles  Abdominal: Soft. Bowel sounds are normal. She exhibits no distension, no abdominal bruit and no mass. There is no tenderness.  Musculoskeletal: She exhibits no edema.  Poor rom spine and hips  Slow gait  Lymphadenopathy:    She has no cervical adenopathy.  Neurological: She is alert. She has normal reflexes. No cranial nerve deficit. She exhibits normal muscle tone. Coordination normal.  Skin: Skin is warm and dry. No rash noted. No pallor.  Psychiatric: She has a normal mood and affect.          Assessment & Plan:   Problem List Items Addressed This Visit      Cardiovascular and Mediastinum   Essential hypertension - Primary    bp in fair control at this time  BP Readings from Last 1 Encounters:  08/16/17 136/78   No changes needed Disc lifstyle change with low sodium diet and exercise  Labs reviewed         Digestive   GERD (gastroesophageal reflux disease)    No imp on ranitidine Has nausea and heartbutn Will change to omeprazole 20 mg daily  Alert if not much improved in 2 wk  Or if symptoms worsen       Relevant Medications   omeprazole (PRILOSEC) 20 MG capsule     Endocrine   Controlled type 2 diabetes mellitus with diabetic autonomic neuropathy ([place])    This  is worse with dietary indiscretion Lab Results  Component Value Date   HGBA1C 7.1 (H) 08/12/2017   Plan made for low glycemic  diet Activity as tolerated  F/u 3 mo        Other   Hyperlipidemia    Well controlled with crestor and diet  Disc goals for lipids and reasons to control them Rev labs with pt Rev low sat fat diet in detail       MDS (myelodysplastic syndrome) ([facility])    Stable cbc Sees heme  No sympotoms       Thrombocytopenia ([facility])    Stable cbc with plt 91 Also MDS Sees hematology

## 2017-08-16 NOTE — Patient Instructions (Addendum)
For acid reflux stop the ranitidine (zantac) since it is not helping, and start omeprazole 20 mg each morning  Let us know if not improving in 2 weeks   Diabetes is worse- from what you are eating Avoid sweets  Avoid beverages with sugar  Try to get most of your carbohydrates from produce (with the exception of white potatoes)  Eat less bread/pasta/rice/snack foods/cereals/sweets and other items from the middle of the grocery store (processed carbs)   When you are out - use your cane (or walker) all the time to prevent falls   Follow up in 3 months with labs prior

## 2017-08-18 NOTE — Assessment & Plan Note (Signed)
Stable cbc Sees heme  No sympotoms

## 2017-08-18 NOTE — Assessment & Plan Note (Signed)
Well controlled with crestor and diet  Disc goals for lipids and reasons to control them Rev labs with pt Rev low sat fat diet in detail

## 2017-08-18 NOTE — Assessment & Plan Note (Addendum)
No imp on ranitidine Has nausea and heartbutn Will change to omeprazole 20 mg daily  Alert if not much improved in 2 wk  Or if symptoms worsen

## 2017-08-18 NOTE — Assessment & Plan Note (Signed)
Stable cbc with plt 91 Also MDS Sees hematology

## 2017-08-18 NOTE — Assessment & Plan Note (Signed)
This is worse with dietary indiscretion Lab Results  Component Value Date   HGBA1C 7.1 (H) 08/12/2017   Plan made for low glycemic diet Activity as tolerated  F/u 3 mo

## 2017-08-18 NOTE — Assessment & Plan Note (Signed)
bp in fair control at this time  BP Readings from Last 1 Encounters:  08/16/17 136/78   No changes needed Disc lifstyle change with low sodium diet and exercise  Labs reviewed

## 2017-08-20 ENCOUNTER — Telehealth: Payer: Self-pay | Admitting: Family Medicine

## 2017-08-20 NOTE — Telephone Encounter (Signed)
Copied from [place] [phone]. Topic: Quick Communication - Rx Refill/Question >> Aug 20, 2017 11:07 AM Aurelio Brash B wrote: Medication: omeprazole (PRILOSEC) 20 MG capsule   Has the patient contacted their pharmacy? yes   (Agent: If no, request that the patient contact the pharmacy for the refill.)   Preferred Pharmacy (with phone number or street name): [id number] [place], [place] - [address] [phone] ([phone] (Fax)     Agent: Please be advised that RX refills may take up to 3 business days. We ask that you follow-up with your pharmacy.

## 2017-08-20 NOTE — Telephone Encounter (Signed)
Attempted to call pt but no answer at this time.[facility] [address] [facility] and stated that they would be able to [facility]il[facility]c [facility] the pt's requested pharmacy.

## 2017-08-27 ENCOUNTER — Encounter: Payer: Self-pay | Admitting: Family Medicine

## 2017-08-27 ENCOUNTER — Telehealth: Payer: Self-pay | Admitting: Family Medicine

## 2017-08-27 ENCOUNTER — Ambulatory Visit: Payer: Medicare HMO | Admitting: Family Medicine

## 2017-08-27 ENCOUNTER — Ambulatory Visit: Payer: Self-pay | Admitting: *Deleted

## 2017-08-27 VITALS — BP 148/76 | HR 68 | Temp 98.0°F | Ht 62.0 in | Wt 157.5 lb

## 2017-08-27 DIAGNOSIS — R4182 Altered mental status, unspecified: Secondary | ICD-10-CM

## 2017-08-27 DIAGNOSIS — N3000 Acute cystitis without hematuria: Secondary | ICD-10-CM

## 2017-08-27 DIAGNOSIS — K219 Gastro-esophageal reflux disease without esophagitis: Secondary | ICD-10-CM

## 2017-08-27 DIAGNOSIS — Z9181 History of falling: Secondary | ICD-10-CM

## 2017-08-27 DIAGNOSIS — R410 Disorientation, unspecified: Secondary | ICD-10-CM

## 2017-08-27 DIAGNOSIS — I10 Essential (primary) hypertension: Secondary | ICD-10-CM

## 2017-08-27 DIAGNOSIS — N390 Urinary tract infection, site not specified: Secondary | ICD-10-CM | POA: Insufficient documentation

## 2017-08-27 LAB — POC URINALSYSI DIPSTICK (AUTOMATED)
BILIRUBIN UA: NEGATIVE
Blood, UA: NEGATIVE
Glucose, UA: NEGATIVE
KETONES UA: NEGATIVE
Nitrite, UA: NEGATIVE
Protein, UA: NEGATIVE
SPEC GRAV UA: 1.015 (ref 1.010–1.025)
Urobilinogen, UA: 1 E.U./dL
pH, UA: 6.5 (ref 5.0–8.0)

## 2017-08-27 LAB — GLUCOSE, POCT (MANUAL RESULT ENTRY): POC GLUCOSE: 116 mg/dL — AB (ref 70–99)

## 2017-08-27 MED ORDER — CEPHALEXIN 500 MG PO CAPS: 500.0000 mg | ORAL_CAPSULE | Freq: Two times a day (BID) | ORAL | 0 refills | Status: DC

## 2017-08-27 NOTE — Telephone Encounter (Signed)
Looks like I am seeing her today at 4:15 -on my schedule  I assume family is aware?   Thanks

## 2017-08-27 NOTE — Assessment & Plan Note (Signed)
Having nausea from ppi Will stop it  May need to ref to GI for gerd symptoms  Zantac did not help

## 2017-08-27 NOTE — Assessment & Plan Note (Signed)
bp is up slightly -as pt is confused -will monitor

## 2017-08-27 NOTE — Assessment & Plan Note (Signed)
Suspect due to uti  Will tx  Work on hydration  Family to update Reassuring vitals and exam  If no imp-consider CT head

## 2017-08-27 NOTE — Assessment & Plan Note (Signed)
Pos ua and cx pending in pt with MS change/confusion and otherwise reassuring exam Cover with keflex 500 mg bid for 7d Pend cx report  Fluids encouraged Update immediately if no improvement  Family will watch her closely and keep [place] informed

## 2017-08-27 NOTE — Assessment & Plan Note (Addendum)
Fell and hit head 6 weeks ago  Brief headache-none since  Now having MS change but also uti symptoms If no imp in confusion with tx will plan CT of head  Reassuring exam

## 2017-08-27 NOTE — Progress Notes (Signed)
Subjective:    Patient ID: Stacey Harris, female    DOB: [birth year], 84 y.o.   MRN: [id number]  HPI Here for confusion/change in mental status   Pt states she has felt bad the past 2-3 days   [place] on her buttocks last week - cutting toe nails - fell back but did not hit head  However did hit her head (back of the head) with a fall about 6 wk ago  Had a brief headache-none since    States she stays nauseated most of the time  The acid reflux medicines make her sick /ok off of them (but then she has indigestion)   Last night - said she felt bad  Went to her room  Then said she could not find her nightgown-so found sitting in recliner w/o clothes Slept there with just a blanket  Slow cognition/confused and groggy  Could not get her up this am to get to church- her family had to dress her  Could not remember her birthday  [place] asleep in the car later- then left purse/cane in the car   Had a sausage biscuit for bkfast  Did not eat lunch   ua today  Results for orders placed or performed in visit on 08/27/17  Glucose (CBG)  Result Value Ref Range   POC Glucose 116 (A) 70 - 99 mg/dl  POCT Urinalysis Dipstick (Automated)  Result Value Ref Range   Color, UA Yellow    Clarity, UA Cloudy    Glucose, UA Negative    Bilirubin, UA Negative    Ketones, UA Negative    Spec Grav, UA 1.015 1.010 - 1.025   Blood, UA Negative    pH, UA 6.5 5.0 - 8.0   Protein, UA Negative    Urobilinogen, UA 1.0 0.2 or 1.0 E.U./dL   Nitrite, UA Negative    Leukocytes, UA Large (3+) (A) Negative     Family lays out medicine so she did not overtake   Did have urinary frequency and urgency last pm and today  Does not c/o of urinary or bladder pain or blood in urine   No hx of dementia     Wt Readings from Last 3 Encounters: 08/27/17 157 lb 8 oz (71.4 kg) 08/16/17 159 lb 8 oz (72.3 kg) 06/06/17 159 lb (72.1 kg) 28.81 kg/m   She was just here on 2/8  bp is up a bit today (poss from  confusion)  No cp or palpitations or headaches or edema  No side effects to medicines  BP Readings from Last 3 Encounters:  08/27/17 (!) 148/76  08/16/17 136/78  06/06/17 (!) 161/76      Dm Lab Results  Component Value Date   HGBA1C 7.1 (H) 08/12/2017   Thyroid Lab Results  Component Value Date   TSH 2.27 02/04/2017   Lab Results  Component Value Date   CREATININE 1.18 08/12/2017   BUN 30 (H) 08/12/2017   NA 142 08/12/2017   K 4.3 08/12/2017   CL 109 08/12/2017   CO2 25 08/12/2017   Lab Results  Component Value Date   WBC 4.8 08/12/2017   HGB 10.6 (L) 08/12/2017   HCT 31.6 (L) 08/12/2017   MCV 100.5 (H) 08/12/2017   PLT 91.0 (L) 08/12/2017   Lab Results  Component Value Date   ALT 19 08/12/2017   AST 29 08/12/2017   ALKPHOS 69 08/12/2017   BILITOT 0.9 08/12/2017     Patient Active Problem List  Diagnosis Date Noted  . Paresthesia 02/11/2017  . History of fall 08/27/2016  . Controlled type 2 diabetes mellitus with diabetic autonomic neuropathy ([place]) 02/24/2016  . Routine general medical examination at a health care facility 02/12/2016  . MDS (myelodysplastic syndrome) ([place]) 10/24/2015  . Normocytic anemia 02/11/2015  . GERD (gastroesophageal reflux disease) 08/06/2014  . Encounter for Medicare annual wellness exam 08/03/2013  . Colon cancer screening 08/03/2013  . Thrombocytopenia ([place]) 08/03/2013  . Insomnia 01/26/2013  . Hypothyroid 09/17/2011  . Lumbar spinal stenosis 06/21/2011    Class: Present on Admission  . BACK PAIN, LUMBAR 08/23/2010  . POSTNASAL DRIP 08/23/2010  . OSTEOARTHRITIS, HIP 03/16/2009  . BASAL CELL CARCINOMA, FACE 07/01/2007  . BASAL CELL CARCINOMA, FACE 07/01/2007  . DIABETIC PERIPHERAL NEUROPATHY 09/24/2006  . Hyperlipidemia 09/24/2006  . Essential hypertension 09/24/2006  . ROSACEA 09/24/2006  . INCONTINENCE, URGE 09/24/2006  . COLONOSCOPY AND REMOVAL OF LESION, HX OF 06/09/2003   Past Medical History:  Diagnosis Date    . Blood transfusion   . Cancer ([place])    basal cell skin CA  . Diabetes mellitus    type II  . Hyperlipidemia   . Hypertension   . Hypothyroidism   . OA (osteoarthritis)    Past Surgical History:  Procedure Laterality Date  . ANKLE FRACTURE SURGERY  10   rt  . CARPAL TUNNEL RELEASE Right 06/06/2017   Procedure: RIGHT CARPAL TUNNEL RELEASE;  Surgeon: Daryll Brod, MD;  Location: [facility];  Service: Orthopedics;  Laterality: Right;  . JOINT REPLACEMENT Right 2010   rt total hip 10  . LUMBAR LAMINECTOMY/DECOMPRESSION MICRODISCECTOMY  06/22/2011   Procedure: LUMBAR LAMINECTOMY/DECOMPRESSION MICRODISCECTOMY;  Surgeon: Marybelle Killings;  Location: [facility];  Service: Orthopedics;  Laterality: N/A;  L3-4, L4-5 Decompression  . MOHS SURGERY  12/08   basal cell skin cancer lt temple  . TAH      and BSO, age 65  . TONSILLECTOMY     Social History   Tobacco Use  . Smoking status: Never Smoker  . Smokeless tobacco: Never Used  Substance Use Topics  . Alcohol use: No    Alcohol/week: 0.0 oz  . Drug use: No   Family History  Problem Relation Age of Onset  . Arthritis Father        RA  . Hypertension Father   . Heart disease Father        CAD  . Diabetes Father   . Arthritis Sister        RA  . Diabetes Brother   . Cancer Brother   . Diabetes Brother   . Cancer Brother   . Diabetes Brother   . Cancer Brother   . Diabetes Sister   . Cancer Brother    Allergies  Allergen Reactions  . Mavik [Trandolapril]     Cough    Current Outpatient Medications on File Prior to Visit  Medication Sig Dispense Refill  . aspirin 81 MG tablet Take 81 mg by mouth daily.      . Blood Glucose Calibration (ACCU-CHEK SMARTVIEW CONTROL) LIQD     . Blood Glucose Monitoring Suppl (ACCU-CHEK AVIVA PLUS) w/Device KIT Check blood sugar once daily and as directed.Dx E11.43 1 kit 0  . diazepam (VALIUM) 5 MG tablet Take 0.5 tablets (2.5 mg total) by mouth at bedtime as needed. 30 tablet 0   . glimepiride (AMARYL) 4 MG tablet TAKE 1 TABLET EVERY DAY BEFORE BREAKFAST 90 tablet 3  .  glucose blood (ACCU-CHEK AVIVA PLUS) test strip Check blood sugar once daily and as directed.Dx E11.43 100 each 3  . hydrochlorothiazide (HYDRODIURIL) 25 MG tablet Take 1 tablet (25 mg total) by mouth every morning. 90 tablet 3  . HYDROcodone-acetaminophen (NORCO) 5-325 MG tablet Take 1 tablet by mouth every 6 (six) hours as needed. 20 tablet 0  . levothyroxine (SYNTHROID, LEVOTHROID) 50 MCG tablet Take 1 tablet (50 mcg total) by mouth daily. 15 tablet 0  . losartan (COZAAR) 50 MG tablet Take 1 tablet (50 mg total) by mouth daily. 15 tablet 0  . metFORMIN (GLUCOPHAGE) 1000 MG tablet Take 1 tablet (1,000 mg total) by mouth 2 (two) times daily with a meal. 180 tablet 0  . metoprolol succinate (TOPROL-XL) 100 MG 24 hr tablet Take 1 tablet (100 mg total) by mouth daily. Take with or immediately following a meal. 90 tablet 3  . Multiple Vitamin (MULTIVITAMIN) capsule Take 1 capsule by mouth daily.    . Omega-3 Fatty Acids (FISH OIL) 1200 MG CAPS Take 1,200 mg by mouth 2 (two) times daily.      . rosuvastatin (CRESTOR) 10 MG tablet Take 1 tablet (10 mg total) by mouth daily. 90 tablet 3   No current facility-administered medications on file prior to visit.      Review of Systems  Constitutional: Positive for chills and fatigue. Negative for activity change, appetite change, fever and unexpected weight change.       Feels cold all the time - ? If chills   HENT: Positive for rhinorrhea. Negative for congestion, ear pain, sinus pressure, sinus pain, sore throat and trouble swallowing.        Occ runny nose No sinus pain   Eyes: Negative for pain, redness and visual disturbance.  Respiratory: Negative for cough, shortness of breath and wheezing.   Cardiovascular: Negative for chest pain and palpitations.  Gastrointestinal: Positive for nausea. Negative for abdominal pain, blood in stool, constipation, diarrhea  and vomiting.       Indigestion/heartburn  Endocrine: Negative for polydipsia and polyuria.  Genitourinary: Positive for frequency. Negative for dysuria, hematuria, pelvic pain and urgency.  Musculoskeletal: Negative for arthralgias, back pain and myalgias.  Skin: Negative for pallor and rash.  Allergic/Immunologic: Negative for environmental allergies.  Neurological: Negative for dizziness, seizures, syncope, facial asymmetry, speech difficulty, light-headedness and headaches.       No focal weakness   Hematological: Negative for adenopathy. Does not bruise/bleed easily.  Psychiatric/Behavioral: Negative for decreased concentration and dysphoric mood. The patient is nervous/anxious.        Objective:   Physical Exam  Constitutional: She appears well-developed and well-nourished. No distress.  Confused elderly female sitting in chair   HENT:  Head: Normocephalic and atraumatic.  Mouth/Throat: Oropharynx is clear and moist.  MMM Throat is clear   Eyes: Conjunctivae and EOM are normal. Pupils are equal, round, and reactive to light. Right eye exhibits no discharge. Left eye exhibits no discharge. No scleral icterus.  Neck: Normal range of motion. Neck supple. No JVD present. Carotid bruit is not present. No thyromegaly present.  Cardiovascular: Normal rate, regular rhythm, normal heart sounds and intact distal pulses. Exam reveals no gallop.  Pulmonary/Chest: Effort normal and breath sounds normal. No respiratory distress. She has no wheezes. She has no rales.  No crackles  Good air exch   Abdominal: Soft. Bowel sounds are normal. She exhibits no distension, no abdominal bruit and no mass. There is no tenderness. There is no rebound  and no guarding.  No suprapubic tenderness or fullness   No cva tenderness   Musculoskeletal: She exhibits no edema.  Baseline puffy ankles w/o pitting   Lymphadenopathy:    She has no cervical adenopathy.  Neurological: She is alert. She has normal  reflexes. No cranial nerve deficit. She exhibits normal muscle tone. Coordination normal.  Skin: Skin is warm and dry. No rash noted. No pallor.  Psychiatric: Her mood appears anxious. Her speech is tangential. She is not agitated and not actively hallucinating. Thought content is not paranoid. Cognition and memory are impaired.  Confused by not agitated           Assessment & Plan:   Problem List Items Addressed This Visit      Cardiovascular and Mediastinum   Essential hypertension    bp is up slightly -as pt is confused -will monitor        Digestive   GERD (gastroesophageal reflux disease)    Having nausea from ppi Will stop it  May need to ref to GI for gerd symptoms  Zantac did not help         Genitourinary   UTI (urinary tract infection)    Pos ua and cx pending in pt with MS change/confusion and otherwise reassuring exam Cover with keflex 500 mg bid for 7d Pend cx report  Fluids encouraged Update immediately if no improvement  Family will watch her closely and keep [place] informed       Relevant Medications   cephALEXin (KEFLEX) 500 MG capsule   Other Relevant Orders   Urine Culture     Other   Change in mental status - Primary    Suspect due to uti  Will tx  Work on hydration  Family to update Reassuring vitals and exam  If no imp-consider CT head       Relevant Orders   Glucose (CBG) (Completed)   POCT Urinalysis Dipstick (Automated) (Completed)   History of fall    Fell and hit head 6 weeks ago  Brief headache-none since  Now having MS change but also uti symptoms If no imp in confusion with tx will plan CT of head  Reassuring exam

## 2017-08-27 NOTE — Telephone Encounter (Signed)
Pt's daughter called to say her mom is acting differently, waking around naked, not knowing who the president is or her birthday. She is confused with some things. She was trying to wear her depends as a blouse.  The daughter thinks that she has seen other things that make her think she may have dementia or some delirum starting. Unable to get an appointment today, will check with flow to check for availability.  Reason for Disposition . [1] Acting confused (e.g., disoriented, slurred speech) AND [2] brief (now gone)  Answer Assessment - Initial Assessment Questions 1. LEVEL OF CONSCIOUSNESS: "How is he (she, the patient) acting right now?" (e.g., alert-oriented, confused, lethargic, stuporous, comatose)     Alert, does not know her birthday, confused with some things 2. ONSET: "When did the confusion start?"  (minutes, hours, days)     Last night 3. PATTERN "Does this come and go, or has it been constant since it started?"  "Is it present now?"     Comes and goes, present 4. ALCOHOL or DRUGS: "Has he been drinking alcohol or taking any drugs?"      No alcohol 5. NARCOTIC MEDICATIONS: "Has he been receiving any narcotic medications?" (e.g., morphine, Vicodin)     no 6. CAUSE: "What do you think is causing the confusion?"      Do not know 7. OTHER SYMPTOMS: "Are there any other symptoms?" (e.g., difficulty breathing, headache, fever, weakness)     no  Protocols used: CONFUSION - DELIRIUM-A-AH

## 2017-08-27 NOTE — Patient Instructions (Addendum)
Stop the omeprazole (stomach medicine) since it is causing nausea   Urine looks infected We will get a culture  Start the keflex twice daily now  One pill now / one later tonight and then am and pm   Encourage lots of water   Do not stay alone until you are feeling better (symptoms can be worse at night)

## 2017-08-27 NOTE — Telephone Encounter (Signed)
Called pt's daughter, [name] to let her know about the appointment change from tomorrow to today. She voiced understanding.

## 2017-08-28 ENCOUNTER — Other Ambulatory Visit: Payer: Self-pay | Admitting: Family Medicine

## 2017-08-28 ENCOUNTER — Ambulatory Visit: Payer: Medicare HMO | Admitting: Family Medicine

## 2017-08-29 LAB — URINE CULTURE
MICRO NUMBER: 90218490
SPECIMEN QUALITY:: ADEQUATE

## 2017-09-05 ENCOUNTER — Telehealth: Payer: Self-pay | Admitting: Family Medicine

## 2017-09-05 MED ORDER — FLUCONAZOLE 150 MG PO TABS: 150.0000 mg | ORAL_TABLET | Freq: Once | ORAL | 0 refills | Status: AC

## 2017-09-05 NOTE — Telephone Encounter (Signed)
I sent a diflucan to [facility] for that  Please keep me posted if no improvement

## 2017-09-05 NOTE — Telephone Encounter (Signed)
[name] notified as instructed by telephone.

## 2017-09-05 NOTE — Telephone Encounter (Signed)
-----   Message from [place], [place] sent at 09/05/2017  9:23 AM EST ----- Pt notified of Urine cx results she says she feels back to her baseline and her confusion is a lot better. Pt does think that the abx may have given her a yeast infections she doesn't have a rash but she does have a lot of vaginal itching since finishing the abx

## 2017-11-08 ENCOUNTER — Other Ambulatory Visit (INDEPENDENT_AMBULATORY_CARE_PROVIDER_SITE_OTHER): Payer: Medicare HMO

## 2017-11-08 DIAGNOSIS — I10 Essential (primary) hypertension: Secondary | ICD-10-CM | POA: Diagnosis not present

## 2017-11-08 DIAGNOSIS — E1143 Type 2 diabetes mellitus with diabetic autonomic (poly)neuropathy: Secondary | ICD-10-CM | POA: Diagnosis not present

## 2017-11-08 LAB — BASIC METABOLIC PANEL
BUN: 32 mg/dL — ABNORMAL HIGH (ref 6–23)
CALCIUM: 10.1 mg/dL (ref 8.4–10.5)
CO2: 26 meq/L (ref 19–32)
CREATININE: 1.11 mg/dL (ref 0.40–1.20)
Chloride: 105 mEq/L (ref 96–112)
GFR: 49.48 mL/min — ABNORMAL LOW (ref >60.00)
GLUCOSE: 137 mg/dL — AB (ref 70–99)
Potassium: 3.8 mEq/L (ref 3.5–5.1)
Sodium: 142 mEq/L (ref 135–145)

## 2017-11-08 LAB — HEMOGLOBIN A1C: HEMOGLOBIN A1C: 7.1 % — AB (ref 4.6–6.5)

## 2017-11-08 LAB — TSH: TSH: 5.29 u[IU]/mL — AB (ref 0.35–4.50)

## 2017-11-12 ENCOUNTER — Ambulatory Visit (INDEPENDENT_AMBULATORY_CARE_PROVIDER_SITE_OTHER): Payer: Medicare HMO | Admitting: Family Medicine

## 2017-11-12 ENCOUNTER — Encounter: Payer: Self-pay | Admitting: Family Medicine

## 2017-11-12 ENCOUNTER — Ambulatory Visit (INDEPENDENT_AMBULATORY_CARE_PROVIDER_SITE_OTHER)
Admission: RE | Admit: 2017-11-12 | Discharge: 2017-11-12 | Disposition: A | Discharge status: Home / Self Care | Payer: Medicare HMO | Source: Ambulatory Visit | Attending: Family Medicine | Admitting: Family Medicine

## 2017-11-12 VITALS — BP 118/72 | HR 58 | Temp 97.4°F | Ht 62.0 in | Wt 161.5 lb

## 2017-11-12 DIAGNOSIS — R0602 Shortness of breath: Secondary | ICD-10-CM

## 2017-11-12 DIAGNOSIS — E039 Hypothyroidism, unspecified: Secondary | ICD-10-CM | POA: Diagnosis not present

## 2017-11-12 DIAGNOSIS — E1143 Type 2 diabetes mellitus with diabetic autonomic (poly)neuropathy: Secondary | ICD-10-CM | POA: Diagnosis not present

## 2017-11-12 DIAGNOSIS — R300 Dysuria: Secondary | ICD-10-CM | POA: Diagnosis not present

## 2017-11-12 DIAGNOSIS — I10 Essential (primary) hypertension: Secondary | ICD-10-CM

## 2017-11-12 DIAGNOSIS — R0609 Other forms of dyspnea: Secondary | ICD-10-CM | POA: Insufficient documentation

## 2017-11-12 MED ORDER — LEVOTHYROXINE SODIUM 50 MCG PO TABS: 50.0000 ug | ORAL_TABLET | Freq: Every day | ORAL | 3 refills | Status: DC

## 2017-11-12 NOTE — Progress Notes (Signed)
Subjective:    Patient ID: Stacey Harris, female    DOB: 16-Feb-1929, 84 y.o.   MRN: [id number]  HPI  Here for f/u of chronic health problems   Feels poorly  She hurts all over and cannot walk well  Sees Dr Lorin [facility]cy Spinal stenosis  Very frustrating Also with carpal tunnel   Is more sob lately on exertion and tires easily  ? If from her chronic pain and deconditioning and anemia (MDS)  She tends to swell in her legs  - a little worse in past 6 mo  No PND  Does have gerd at night  Does not have to sleep propped up    Wt Readings from Last 3 Encounters:  11/12/17 161 lb 8 oz (73.3 kg)  08/27/17 157 lb 8 oz (71.4 kg)  08/16/17 159 lb 8 oz (72.3 kg)  up 3lb -cannot exercise much/tries to do a little bit  Not watching diet  29.54 kg/m   uti got better  Has days when urine burns a little  Drinking lots of water    bp is stable today  No cp or palpitations or headaches or edema  No side effects to medicines  BP Readings from Last 3 Encounters:  11/12/17 118/72  08/27/17 (!) 148/76  08/16/17 136/78      DM2 Lab Results  Component Value Date   HGBA1C 7.1 (H) 11/08/2017  this is stable Not watching diet  On amaryl and metformin  Losartan for renal protection  Eye exam -has it planned for June     Hypothyroid Lab Results  Component Value Date   TSH 5.29 (H) 11/08/2017   she missed some doses when she ran out   Patient Active Problem List   Diagnosis Date Noted  . Dysuria 11/12/2017  . SOB (shortness of breath) on exertion 11/12/2017  . Change in mental status 08/27/2017  . UTI (urinary tract infection) 08/27/2017  . Paresthesia 02/11/2017  . History of fall 08/27/2016  . Controlled type 2 diabetes mellitus with diabetic autonomic neuropathy ([place]) 02/24/2016  . Routine general medical examination at a health care facility 02/12/2016  . MDS (myelodysplastic syndrome) ([place]) 10/24/2015  . Normocytic anemia 02/11/2015  . GERD (gastroesophageal reflux  disease) 08/06/2014  . Encounter for Medicare annual wellness exam 08/03/2013  . Colon cancer screening 08/03/2013  . Thrombocytopenia ([place]) 08/03/2013  . Insomnia 01/26/2013  . Hypothyroid 09/17/2011  . Lumbar spinal stenosis 06/21/2011    Class: Present on Admission  . BACK PAIN, LUMBAR 08/23/2010  . POSTNASAL DRIP 08/23/2010  . OSTEOARTHRITIS, HIP 03/16/2009  . BASAL CELL CARCINOMA, FACE 07/01/2007  . BASAL CELL CARCINOMA, FACE 07/01/2007  . DIABETIC PERIPHERAL NEUROPATHY 09/24/2006  . Hyperlipidemia 09/24/2006  . Essential hypertension 09/24/2006  . ROSACEA 09/24/2006  . INCONTINENCE, URGE 09/24/2006  . COLONOSCOPY AND REMOVAL OF LESION, HX OF 06/09/2003   Past Medical History:  Diagnosis Date  . Blood transfusion   . Cancer ([place])    basal cell skin CA  . Diabetes mellitus    type II  . Hyperlipidemia   . Hypertension   . Hypothyroidism   . OA (osteoarthritis)    Past Surgical History:  Procedure Laterality Date  . ANKLE FRACTURE SURGERY  10   rt  . CARPAL TUNNEL RELEASE Right 06/06/2017   Procedure: RIGHT CARPAL TUNNEL RELEASE;  Surgeon: Daryll Brod, MD;  Location: [facility];  Service: Orthopedics;  Laterality: Right;  . JOINT REPLACEMENT Right 2010   rt  total hip 10  . LUMBAR LAMINECTOMY/DECOMPRESSION MICRODISCECTOMY  06/22/2011   Procedure: LUMBAR LAMINECTOMY/DECOMPRESSION MICRODISCECTOMY;  Surgeon: Marybelle Killings;  Location: [facility];  Service: Orthopedics;  Laterality: N/A;  L3-4, L4-5 Decompression  . MOHS SURGERY  12/08   basal cell skin cancer lt temple  . TAH      and BSO, age 44  . TONSILLECTOMY     Social History   Tobacco Use  . Smoking status: Never Smoker  . Smokeless tobacco: Never Used  Substance Use Topics  . Alcohol use: No    Alcohol/week: 0.0 oz  . Drug use: No   Family History  Problem Relation Age of Onset  . Arthritis Father        RA  . Hypertension Father   . Heart disease Father        CAD  . Diabetes Father     . Arthritis Sister        RA  . Diabetes Brother   . Cancer Brother   . Diabetes Brother   . Cancer Brother   . Diabetes Brother   . Cancer Brother   . Diabetes Sister   . Cancer Brother    Allergies  Allergen Reactions  . Mavik [Trandolapril]     Cough    Current Outpatient Medications on File Prior to Visit  Medication Sig Dispense Refill  . aspirin 81 MG tablet Take 81 mg by mouth daily.      . Blood Glucose Calibration (ACCU-CHEK SMARTVIEW CONTROL) LIQD     . Blood Glucose Monitoring Suppl (ACCU-CHEK AVIVA PLUS) w/Device KIT Check blood sugar once daily and as directed.Dx E11.43 1 kit 0  . cephALEXin (KEFLEX) 500 MG capsule Take 1 capsule (500 mg total) by mouth 2 (two) times daily. 14 capsule 0  . diazepam (VALIUM) 5 MG tablet Take 0.5 tablets (2.5 mg total) by mouth at bedtime as needed. 30 tablet 0  . glimepiride (AMARYL) 4 MG tablet TAKE 1 TABLET EVERY DAY BEFORE BREAKFAST 90 tablet 3  . glucose blood (ACCU-CHEK AVIVA PLUS) test strip Check blood sugar once daily and as directed.Dx E11.43 100 each 3  . hydrochlorothiazide (HYDRODIURIL) 25 MG tablet Take 1 tablet (25 mg total) by mouth every morning. 90 tablet 3  . HYDROcodone-acetaminophen (NORCO) 5-325 MG tablet Take 1 tablet by mouth every 6 (six) hours as needed. 20 tablet 0  . losartan (COZAAR) 50 MG tablet Take 1 tablet (50 mg total) by mouth daily. 15 tablet 0  . metFORMIN (GLUCOPHAGE) 1000 MG tablet TAKE 1 TABLET BY MOUTH TWICE A DAY WITH A MEAL 180 tablet 0  . metoprolol succinate (TOPROL-XL) 100 MG 24 hr tablet Take 1 tablet (100 mg total) by mouth daily. Take with or immediately following a meal. 90 tablet 3  . Multiple Vitamin (MULTIVITAMIN) capsule Take 1 capsule by mouth daily.    . Omega-3 Fatty Acids (FISH OIL) 1200 MG CAPS Take 1,200 mg by mouth 2 (two) times daily.      . rosuvastatin (CRESTOR) 10 MG tablet Take 1 tablet (10 mg total) by mouth daily. 90 tablet 3   No current facility-administered  medications on file prior to visit.      Review of Systems  Constitutional: Positive for activity change and fatigue. Negative for appetite change, fever and unexpected weight change.  HENT: Negative for congestion, ear pain, rhinorrhea, sinus pressure and sore throat.   Eyes: Negative for pain, redness and visual disturbance.  Respiratory: Positive for shortness  of breath. Negative for cough, chest tightness and wheezing.   Cardiovascular: Positive for leg swelling. Negative for chest pain and palpitations.  Gastrointestinal: Negative for abdominal pain, blood in stool, constipation and diarrhea.  Endocrine: Negative for polydipsia and polyuria.  Genitourinary: Positive for dysuria. Negative for frequency and urgency.       Occ dysuria but much better than it was   Musculoskeletal: Positive for arthralgias, back pain and gait problem. Negative for myalgias.  Skin: Negative for pallor and rash.  Allergic/Immunologic: Negative for environmental allergies.  Neurological: Negative for dizziness, syncope and headaches.  Hematological: Negative for adenopathy. Does not bruise/bleed easily.  Psychiatric/Behavioral: Positive for dysphoric mood. Negative for decreased concentration. The patient is not nervous/anxious.        Objective:   Physical Exam  Constitutional: She appears well-developed and well-nourished. No distress.  Frail appearing elderly female   HENT:  Head: Normocephalic and atraumatic.  Mouth/Throat: Oropharynx is clear and moist.  MMM  Eyes: Pupils are equal, round, and reactive to light. Conjunctivae and EOM are normal. No scleral icterus.  Neck: Normal range of motion. Neck supple. No JVD present. Carotid bruit is not present. No thyromegaly present.  Cardiovascular: Normal rate, regular rhythm, normal heart sounds and intact distal pulses. Exam reveals no gallop.  Pulmonary/Chest: Effort normal and breath sounds normal. No stridor. No respiratory distress. She has no  wheezes. She has no rales.  No crackles  Does not take deep breaths   Abdominal: Soft. Bowel sounds are normal. She exhibits no distension and no abdominal bruit. There is no tenderness.  Musculoskeletal: She exhibits edema.  Puffy ankles  No pitting  No tenderness Neg Homans' sign  Lymphadenopathy:    She has no cervical adenopathy.  Neurological: She is alert. She has normal reflexes. No cranial nerve deficit. Coordination normal.  Skin: Skin is warm and dry. No rash noted.  Psychiatric: Her speech is normal and behavior is normal. Thought content normal. She exhibits a depressed mood.  Pt is very frustrated and seems depressed to me            Assessment & Plan:   Problem List Items Addressed This Visit      Cardiovascular and Mediastinum   Essential hypertension    bp in fair control at this time  BP Readings from Last 1 Encounters:  11/12/17 118/72   No changes needed Disc lifstyle change with low sodium diet and exercise  Labs rev         Endocrine   Controlled type 2 diabetes mellitus with diabetic autonomic neuropathy ([place]) - Primary    Lab Results  Component Value Date   HGBA1C 7.1 (H) 11/08/2017   Difficult to control as pt does not watch diet/not motivated due to chronic pain/also cannot move much  She will schedule her eye exam [facility] for renal protection       Hypothyroid    Lab Results  Component Value Date   TSH 5.29 (H) 11/08/2017    This is up- but pt did miss some doses when she ran out  Will not change dose for now  No clinical changes       Relevant Medications   levothyroxine (SYNTHROID, LEVOTHROID) 50 MCG tablet     Other   Dysuria    On and off since uti  Overall much better  Urine sent for culture       Relevant Orders   Urine Culture   SOB (shortness of breath) on  exertion    Chest xray today  Re assuring exam  Likely deconditioned  Some LE edema-per pt worse than usual (no pitting)      Relevant Orders   DG Chest 2  View (Completed)

## 2017-11-12 NOTE — Patient Instructions (Addendum)
Aim for 64 oz of fluids per day-mostly water   Get your eye exam in June as planned   Chest xray today for shortness of breath   Urine culture to make sure infection is better   Use your cane for walking at all times

## 2017-11-13 NOTE — Assessment & Plan Note (Signed)
Lab Results  Component Value Date   TSH 5.29 (H) 11/08/2017    This is up- but pt did miss some doses when she ran out  Will not change dose for now  No clinical changes

## 2017-11-13 NOTE — Assessment & Plan Note (Signed)
bp in fair control at this time  BP Readings from Last 1 Encounters:  11/12/17 118/72   No changes needed Disc lifstyle change with low sodium diet and exercise  Labs rev

## 2017-11-13 NOTE — Assessment & Plan Note (Signed)
Lab Results  Component Value Date   HGBA1C 7.1 (H) 11/08/2017   Difficult to control as pt does not watch diet/not motivated due to chronic pain/also cannot move much  She will schedule her eye exam Ar[facility] for renal protection

## 2017-11-13 NOTE — Assessment & Plan Note (Signed)
On and off since uti  Overall much better  Urine sent for culture

## 2017-11-13 NOTE — Assessment & Plan Note (Signed)
Chest xray today  Re assuring exam  Likely deconditioned  Some LE edema-per pt worse than usual (no pitting)

## 2017-11-15 ENCOUNTER — Telehealth: Payer: Self-pay | Admitting: Family Medicine

## 2017-11-15 ENCOUNTER — Other Ambulatory Visit: Payer: Self-pay | Admitting: Family Medicine

## 2017-11-15 DIAGNOSIS — R0602 Shortness of breath: Secondary | ICD-10-CM

## 2017-11-15 DIAGNOSIS — J811 Chronic pulmonary edema: Secondary | ICD-10-CM | POA: Insufficient documentation

## 2017-11-15 DIAGNOSIS — J810 Acute pulmonary edema: Secondary | ICD-10-CM

## 2017-11-15 LAB — URINE CULTURE
MICRO NUMBER:: 90555645
SPECIMEN QUALITY:: ADEQUATE

## 2017-11-15 MED ORDER — CEPHALEXIN 250 MG PO CAPS: 250.0000 mg | ORAL_CAPSULE | Freq: Three times a day (TID) | ORAL | 0 refills | Status: DC

## 2017-11-15 NOTE — Telephone Encounter (Signed)
UCx shows infection  I want to tx with keflex (she did well with this in the past)  This time TID dosing   It is pending-please ask for preferred pharmacy and send  Alert if no improvement

## 2017-11-15 NOTE — Telephone Encounter (Signed)
Pt notified of urine cx results and Dr. Tower's comments. Rx sent to pharmacy  

## 2017-11-15 NOTE — Telephone Encounter (Signed)
-----   Message from Tammi Sou, [place] sent at 11/15/2017  9:35 AM EDT ----- Pt notified of xray results and agrees with referral to cardiology but she wants [place] to check with her daughter about location, she isn't sure which city works best for her, please put referral in and have Rosaria Ferries call daughter to discuss referral

## 2017-11-15 NOTE — Telephone Encounter (Signed)
Ref done Will route to [facility]  Thanks

## 2017-11-18 NOTE — Telephone Encounter (Signed)
Called both daughters and had to [facility]

## 2017-11-22 ENCOUNTER — Ambulatory Visit: Payer: Self-pay | Admitting: *Deleted

## 2017-11-22 NOTE — Telephone Encounter (Signed)
Called pt. No answer. Left message on machine.

## 2017-11-22 NOTE — Telephone Encounter (Signed)
Pt has been prescribed keflex and has developed a rash and would like to have something else called in and what can she take for the rash  Best number [phone]

## 2017-11-25 NOTE — Telephone Encounter (Signed)
She was px a 7 d supply on the 10 th so I think she should be done with it anyway- how many days did she get through and how is she feeling urinary wise Please add keflex to her allergy list Thanks

## 2017-11-25 NOTE — Telephone Encounter (Signed)
Patient's daughter, Stacey Harris, was called to discuss the rash. She says "when I called on Friday, the rash was there and I had her to stop the Keflex. Now the rash is going away, so I know it was the Keflex. My mom says she needs another antibiotic called in." I asked about any other concerns, the daughter says "no, she just needs another antibiotic called in." I advised this will be sent to Dr. Glori Bickers for her recommendation and someone will call with the recommendation, she verbalized understanding.  Reason for Disposition . Caller requesting a NON-URGENT new prescription or refill and triager unable to refill per unit policy  Answer Assessment - Initial Assessment Questions 1. SYMPTOMS: "Do you have any symptoms?"     Rash 2. SEVERITY: If symptoms are present, ask "Are they mild, moderate or severe?"    Going away now  Protocols used: MEDICATION QUESTION CALL-A-AH

## 2017-11-26 NOTE — Telephone Encounter (Signed)
Left VM requesting pt or daughter to call the office back, CRM created

## 2017-11-28 ENCOUNTER — Telehealth: Payer: Self-pay

## 2017-11-28 MED ORDER — SULFAMETHOXAZOLE-TRIMETHOPRIM 800-160 MG PO TABS: 1.0000 | ORAL_TABLET | Freq: Two times a day (BID) | ORAL | 0 refills | Status: DC

## 2017-11-28 NOTE — Telephone Encounter (Signed)
Patient's daughter Lidia Collum returned call, advised of the note of Dr. Glori Bickers on 11/22/17. Daughter says "she only took the Keflex for 3 days and she developed the rash. She is still having burning, odor and pain with urination. She needs another antibiotic." I advised this will be sent to Dr. Glori Bickers high priority to be viewed today, she verbalized understanding.

## 2017-11-28 NOTE — Addendum Note (Signed)
Addended by: Loura Pardon A on: 11/28/2017 01:32 PM   Modules accepted: Orders

## 2017-11-28 NOTE — Telephone Encounter (Signed)
I [facility]actrim [facility] F/u if no improvement

## 2017-11-28 NOTE — Telephone Encounter (Signed)
Copied from [place] [id number]. Topic: Quick Communication - See Telephone Encounter >> Nov 26, 2017  9:12 AM Tammi Sou, CMA wrote: CRM for notification. See Telephone encounter regarding pt's daughter wanting a new antibiotic for pt. Please have her speak with the triage nurse so they can advise them of Dr. Marliss Coots comments off of phone note  >> Nov 27, 2017  5:25 PM Percell Belt A wrote: Pt daughter called in and stated that pt had a reaction to the antibiotics.  She stated she was waiting for a call back to let them know what it should be replaced with ?    Best number [phone]

## 2017-11-28 NOTE — Telephone Encounter (Signed)
Daughter notified Rx sent and to f/u if no improvement

## 2017-11-28 NOTE — Telephone Encounter (Signed)
See original phone note where Dr. Marliss Coots made comments. I have made another CRM asking [facility] to xfer the call to the triage nurse so they can ask Dr. Marliss Coots questions and eval pt. Thanks

## 2017-11-28 NOTE — Telephone Encounter (Signed)
Left VM requesting pt's daughter to call the office back 

## 2017-11-29 DIAGNOSIS — G5603 Carpal tunnel syndrome, bilateral upper limbs: Secondary | ICD-10-CM | POA: Diagnosis not present

## 2017-11-29 DIAGNOSIS — M79642 Pain in left hand: Secondary | ICD-10-CM | POA: Diagnosis not present

## 2017-11-29 DIAGNOSIS — M79641 Pain in right hand: Secondary | ICD-10-CM | POA: Diagnosis not present

## 2017-12-09 DIAGNOSIS — H04123 Dry eye syndrome of bilateral lacrimal glands: Secondary | ICD-10-CM | POA: Diagnosis not present

## 2017-12-09 DIAGNOSIS — H2513 Age-related nuclear cataract, bilateral: Secondary | ICD-10-CM | POA: Diagnosis not present

## 2017-12-09 DIAGNOSIS — H25013 Cortical age-related cataract, bilateral: Secondary | ICD-10-CM | POA: Diagnosis not present

## 2017-12-09 DIAGNOSIS — H2512 Age-related nuclear cataract, left eye: Secondary | ICD-10-CM | POA: Diagnosis not present

## 2017-12-09 DIAGNOSIS — E119 Type 2 diabetes mellitus without complications: Secondary | ICD-10-CM | POA: Diagnosis not present

## 2017-12-16 ENCOUNTER — Other Ambulatory Visit: Payer: Self-pay | Admitting: *Deleted

## 2017-12-16 MED ORDER — METFORMIN HCL 1000 MG PO TABS: ORAL_TABLET | ORAL | 0 refills | Status: DC

## 2017-12-17 ENCOUNTER — Telehealth: Payer: Self-pay | Admitting: Cardiovascular Disease

## 2017-12-17 NOTE — Telephone Encounter (Signed)
Lmov to confirm new time of Appointment Change in schedule Will try again at a later time

## 2018-01-10 NOTE — Progress Notes (Signed)
Cardiology Office Note  Date:  01/14/2018   ID:  Stacey Harris, DOB July 03, 1929, MRN [id number]  PCP:  Stacey Greenspan, MD   Chief Complaint  Patient presents with  . New Patient (Initial Visit)    Referred by PCP for SOB. Patient c/o SOB and swelling in ankles. Meds reviewed verbally with patient.     HPI:  [name] is an 84 yo woman with past medical history of Shortness of breath on exertion nonsmoker Fatigue Chronic pain Deconditioning MDS, anemia, HGB 10.4 Leg swelling GERD Diabetes type 2 Hypertension Carpal tunnel Referred by Dr. Glori Harris for consultation of her shortness of breath and pulmonary edema  Recent chest x-ray CHF with interstitial edema vascular congestion moderately enlarged heart  She reports having numerous Years of SOB with exertion,  Family thinks it is worse recently Nonpitting leg swelling Sedentary during the daytime, no regular active exercise program  Denies any PND orthopnea No chest pain on exertion, denies any cough  History of chronic anemia HCT 31, stable over the past year  No cardiac studies performed No chest CT  Hemoglobin A1c 7.1 Total cholesterol 124 LDL 67 Creatinine 1.1 BUN 32  EKG personally reviewed by myself on todays visit Shows normal sinus rhythm with frequent APCs right bundle branch block  PMH:   has a past medical history of Blood transfusion, Cancer ([place]), Diabetes mellitus, Hyperlipidemia, Hypertension, Hypothyroidism, and OA (osteoarthritis).  PSH:    Past Surgical History:  Procedure Laterality Date  . ANKLE FRACTURE SURGERY  10   rt  . CARPAL TUNNEL RELEASE Right 06/06/2017   Procedure: RIGHT CARPAL TUNNEL RELEASE;  Surgeon: Daryll Brod, MD;  Location: [facility];  Service: Orthopedics;  Laterality: Right;  . JOINT REPLACEMENT Right 2010   rt total hip 10  . LUMBAR LAMINECTOMY/DECOMPRESSION MICRODISCECTOMY  06/22/2011   Procedure: LUMBAR LAMINECTOMY/DECOMPRESSION MICRODISCECTOMY;   Surgeon: Marybelle Killings;  Location: [place];  Service: Orthopedics;  Laterality: N/A;  L3-4, L4-5 Decompression  . MOHS SURGERY  12/08   basal cell skin cancer lt temple  . TAH      and BSO, age 46  . TONSILLECTOMY      Current Outpatient Medications  Medication Sig Dispense Refill  . aspirin 81 MG tablet Take 81 mg by mouth daily.      . Blood Glucose Calibration (ACCU-CHEK SMARTVIEW CONTROL) LIQD     . Blood Glucose Monitoring Suppl (ACCU-CHEK AVIVA PLUS) w/Device KIT Check blood sugar once daily and as directed.Dx E11.43 1 kit 0  . diazepam (VALIUM) 5 MG tablet Take 0.5 tablets (2.5 mg total) by mouth at bedtime as needed. 30 tablet 0  . glimepiride (AMARYL) 4 MG tablet TAKE 1 TABLET EVERY DAY BEFORE BREAKFAST 90 tablet 3  . glucose blood (ACCU-CHEK AVIVA PLUS) test strip Check blood sugar once daily and as directed.Dx E11.43 100 each 3  . hydrochlorothiazide (HYDRODIURIL) 25 MG tablet Take 1 tablet (25 mg total) by mouth every morning. 90 tablet 3  . levothyroxine (SYNTHROID, LEVOTHROID) 50 MCG tablet Take 1 tablet (50 mcg total) by mouth daily. 90 tablet 3  . losartan (COZAAR) 50 MG tablet Take 1 tablet (50 mg total) by mouth daily. 15 tablet 0  . metFORMIN (GLUCOPHAGE) 1000 MG tablet TAKE 1 TABLET BY MOUTH TWICE A DAY WITH A MEAL 180 tablet 0  . metoprolol succinate (TOPROL-XL) 100 MG 24 hr tablet Take 1 tablet (100 mg total) by mouth daily. Take with or immediately following  a meal. 90 tablet 3  . Multiple Vitamin (MULTIVITAMIN) capsule Take 1 capsule by mouth daily.    . Omega-3 Fatty Acids (FISH OIL) 1200 MG CAPS Take 1,200 mg by mouth 2 (two) times daily.      . rosuvastatin (CRESTOR) 10 MG tablet Take 1 tablet (10 mg total) by mouth daily. 90 tablet 3   No current facility-administered medications for this visit.      Allergies:   Keflex [cephalexin] and Mavik [trandolapril]   Social History:  The patient  reports that she has never smoked. She has never used smokeless tobacco.  She reports that she does not drink alcohol or use drugs.   Family History:   family history includes Arthritis in her father and sister; Cancer in her brother, brother, brother, and brother; Diabetes in her brother, brother, brother, father, and sister; Heart disease in her father; Hypertension in her father.    Review of Systems: Review of Systems  Constitutional: Positive for malaise/fatigue.  Respiratory: Positive for shortness of breath.   Cardiovascular: Positive for leg swelling.  Gastrointestinal: Negative.   Musculoskeletal: Negative.   Neurological: Negative.   Psychiatric/Behavioral: Negative.   All other systems reviewed and are negative.    PHYSICAL EXAM: VS:  BP (!) 156/76 (BP Location: Left Arm, Patient Position: Sitting, Cuff Size: Normal)   Pulse 68   Ht 5' 1.5" (1.562 m)   Wt 163 lb (73.9 kg)   BMI 30.30 kg/m  , BMI Body mass index is 30.3 kg/m. GEN: Well nourished, well developed, in no acute distress  HEENT: normal  Neck: no JVD, carotid bruits, or masses Cardiac: RRR; no murmurs, rubs, or gallops,nonpitting lower extremity edema Respiratory:  clear to auscultation bilaterally, normal work of breathing GI: soft, nontender, nondistended, + BS MS: no deformity or atrophy  Skin: warm and dry, no rash Neuro:  Strength and sensation are intact Psych: euthymic mood, full affect   Recent Labs: 08/12/2017: ALT 19; Hemoglobin 10.6; Platelets 91.0 11/08/2017: BUN 32; Creatinine, Ser 1.11; Potassium 3.8; Sodium 142; TSH 5.29    Lipid Panel Lab Results  Component Value Date   CHOL 124 08/12/2017   HDL 39.40 08/12/2017   LDLCALC 67 08/12/2017   TRIG 86.0 08/12/2017      Wt Readings from Last 3 Encounters:  01/14/18 163 lb (73.9 kg)  11/12/17 161 lb 8 oz (73.3 kg)  08/27/17 157 lb 8 oz (71.4 kg)       ASSESSMENT AND PLAN:  Controlled type 2 diabetes mellitus with diabetic autonomic neuropathy, without long-term current use of insulin ([place]) - Plan: EKG  12-Lead We have encouraged continued exercise, careful diet management in an effort to lose weight.  Acute pulmonary edema (HCC) - Plan: EKG 12-Lead Echocardiogram ordered based on chest xray No significant edema or abdominal swelling Clinical exam with Rales at the right base, unable to exclude atelectasis  Essential hypertension Blood pressure elevated on today's visit but is typically well controlled another office visits No medication changes made She'll monitor blood pressure at home  MDS (myelodysplastic syndrome) (HCC) Stable hemoglobin 10.4 Managed by primary care  SOB (shortness of breath) on exertion - Plan: ECHOCARDIOGRAM COMPLETE Echocardiogram ordered If ejection fraction normal with no elevated right heart pressures,  Shortness of breath likely secondary to profound deconditioning We did discuss ischemia workup. We'll hold off for now until echocardiogram is back  Disposition:   F/U  As needed   Total encounter time more than 60 minutes  Greater than  50% was spent in counseling and coordination of care with the patient    Orders Placed This Encounter  Procedures  . EKG 12-Lead  . ECHOCARDIOGRAM COMPLETE     Signed, Esmond Plants, M.D., Ph.D. 01/14/2018  [place], [place]

## 2018-01-14 ENCOUNTER — Encounter: Payer: Self-pay | Admitting: Cardiovascular Disease

## 2018-01-14 ENCOUNTER — Ambulatory Visit: Payer: Medicare HMO | Admitting: Cardiovascular Disease

## 2018-01-14 VITALS — BP 156/76 | HR 68 | Ht 61.5 in | Wt 163.0 lb

## 2018-01-14 DIAGNOSIS — J810 Acute pulmonary edema: Secondary | ICD-10-CM | POA: Diagnosis not present

## 2018-01-14 DIAGNOSIS — R0602 Shortness of breath: Secondary | ICD-10-CM | POA: Diagnosis not present

## 2018-01-14 DIAGNOSIS — D469 Myelodysplastic syndrome, unspecified: Secondary | ICD-10-CM | POA: Diagnosis not present

## 2018-01-14 DIAGNOSIS — E1143 Type 2 diabetes mellitus with diabetic autonomic (poly)neuropathy: Secondary | ICD-10-CM

## 2018-01-14 DIAGNOSIS — I10 Essential (primary) hypertension: Secondary | ICD-10-CM

## 2018-01-14 NOTE — Patient Instructions (Addendum)
Medication Instructions:   No medication changes made  Labwork:  No new labs needed  Testing/Procedures:  We will order an echocardiogram for shortness of breath, leg swelling    Follow-Up: It was a pleasure seeing you in the office today. Please call us if you have new issues that need to be addressed before your next appt.  [phone]  Your physician wants you to follow-up in:  As needed We will call you with the results of your   If you need a refill on your cardiac medications before your next appointment, please call your pharmacy.  For educational health videos Log in to : [email or web address] Or : [email or web address], password : [email or web address]

## 2018-01-21 DIAGNOSIS — H2512 Age-related nuclear cataract, left eye: Secondary | ICD-10-CM | POA: Diagnosis not present

## 2018-01-21 DIAGNOSIS — H25812 Combined forms of age-related cataract, left eye: Secondary | ICD-10-CM | POA: Diagnosis not present

## 2018-01-28 ENCOUNTER — Other Ambulatory Visit: Payer: Self-pay

## 2018-01-28 ENCOUNTER — Ambulatory Visit (INDEPENDENT_AMBULATORY_CARE_PROVIDER_SITE_OTHER): Payer: Medicare HMO

## 2018-01-28 DIAGNOSIS — R0602 Shortness of breath: Secondary | ICD-10-CM | POA: Diagnosis not present

## 2018-02-10 ENCOUNTER — Ambulatory Visit: Payer: Medicare HMO

## 2018-02-17 ENCOUNTER — Encounter: Payer: Medicare HMO | Admitting: Family Medicine

## 2018-02-20 DIAGNOSIS — H25011 Cortical age-related cataract, right eye: Secondary | ICD-10-CM | POA: Diagnosis not present

## 2018-02-20 DIAGNOSIS — H2511 Age-related nuclear cataract, right eye: Secondary | ICD-10-CM | POA: Diagnosis not present

## 2018-03-04 DIAGNOSIS — H25811 Combined forms of age-related cataract, right eye: Secondary | ICD-10-CM | POA: Diagnosis not present

## 2018-03-04 DIAGNOSIS — H2511 Age-related nuclear cataract, right eye: Secondary | ICD-10-CM | POA: Diagnosis not present

## 2018-03-17 ENCOUNTER — Other Ambulatory Visit: Payer: Self-pay | Admitting: *Deleted

## 2018-03-17 MED ORDER — METFORMIN HCL 1000 MG PO TABS: ORAL_TABLET | ORAL | 1 refills | Status: DC

## 2018-04-06 ENCOUNTER — Telehealth: Payer: Self-pay | Admitting: Family Medicine

## 2018-04-06 DIAGNOSIS — I10 Essential (primary) hypertension: Secondary | ICD-10-CM

## 2018-04-06 DIAGNOSIS — D696 Thrombocytopenia, unspecified: Secondary | ICD-10-CM

## 2018-04-06 DIAGNOSIS — E039 Hypothyroidism, unspecified: Secondary | ICD-10-CM

## 2018-04-06 DIAGNOSIS — E1143 Type 2 diabetes mellitus with diabetic autonomic (poly)neuropathy: Secondary | ICD-10-CM

## 2018-04-06 DIAGNOSIS — E7800 Pure hypercholesterolemia, unspecified: Secondary | ICD-10-CM

## 2018-04-06 NOTE — Telephone Encounter (Signed)
-----   Message from Eustace Pen, LPN sent at 3/97/9536  4:09 PM EDT ----- Regarding: Labs 10/1 Lab orders needed. Thank you.  Insurance:  [employer]

## 2018-04-08 ENCOUNTER — Ambulatory Visit (INDEPENDENT_AMBULATORY_CARE_PROVIDER_SITE_OTHER): Payer: Medicare HMO

## 2018-04-08 ENCOUNTER — Encounter (INDEPENDENT_AMBULATORY_CARE_PROVIDER_SITE_OTHER): Payer: Self-pay

## 2018-04-08 VITALS — BP 110/70 | HR 55 | Temp 97.8°F | Ht 62.0 in | Wt 165.5 lb

## 2018-04-08 DIAGNOSIS — Z0000 Encounter for general adult medical examination without abnormal findings: Secondary | ICD-10-CM

## 2018-04-08 DIAGNOSIS — D696 Thrombocytopenia, unspecified: Secondary | ICD-10-CM

## 2018-04-08 DIAGNOSIS — E039 Hypothyroidism, unspecified: Secondary | ICD-10-CM

## 2018-04-08 DIAGNOSIS — I10 Essential (primary) hypertension: Secondary | ICD-10-CM

## 2018-04-08 DIAGNOSIS — Z23 Encounter for immunization: Secondary | ICD-10-CM | POA: Diagnosis not present

## 2018-04-08 DIAGNOSIS — E1143 Type 2 diabetes mellitus with diabetic autonomic (poly)neuropathy: Secondary | ICD-10-CM

## 2018-04-08 DIAGNOSIS — E7800 Pure hypercholesterolemia, unspecified: Secondary | ICD-10-CM

## 2018-04-08 LAB — CBC WITH DIFFERENTIAL/PLATELET
BASOS PCT: 1.1 % (ref 0.0–3.0)
Basophils Absolute: 0.1 10*3/uL (ref 0.0–0.1)
EOS PCT: 2 % (ref 0.0–5.0)
Eosinophils Absolute: 0.1 10*3/uL (ref 0.0–0.7)
HCT: 31.4 % — ABNORMAL LOW (ref 36.0–46.0)
Hemoglobin: 10.7 g/dL — ABNORMAL LOW (ref 12.0–15.0)
LYMPHS ABS: 2.2 10*3/uL (ref 0.7–4.0)
Lymphocytes Relative: 32.5 % (ref 12.0–46.0)
MCHC: 34 g/dL (ref 30.0–36.0)
MCV: 100.8 fl — AB (ref 78.0–100.0)
MONOS PCT: 9.6 % (ref 3.0–12.0)
Monocytes Absolute: 0.7 10*3/uL (ref 0.1–1.0)
NEUTROS ABS: 3.7 10*3/uL (ref 1.4–7.7)
NEUTROS PCT: 54.8 % (ref 43.0–77.0)
Platelets: 106 10*3/uL — ABNORMAL LOW (ref 150.0–400.0)
RBC: 3.11 Mil/uL — AB (ref 3.87–5.11)
RDW: 15 % (ref 11.5–15.5)
WBC: 6.8 10*3/uL (ref 4.0–10.5)

## 2018-04-08 LAB — COMPREHENSIVE METABOLIC PANEL
ALK PHOS: 60 U/L (ref 39–117)
ALT: 21 U/L (ref 0–35)
AST: 34 U/L (ref 0–37)
Albumin: 3.4 g/dL — ABNORMAL LOW (ref 3.5–5.2)
BUN: 29 mg/dL — ABNORMAL HIGH (ref 6–23)
CHLORIDE: 108 meq/L (ref 96–112)
CO2: 25 meq/L (ref 19–32)
Calcium: 9.3 mg/dL (ref 8.4–10.5)
Creatinine, Ser: 1.3 mg/dL — ABNORMAL HIGH (ref 0.40–1.20)
GFR: 41.19 mL/min — AB (ref >60.00)
GLUCOSE: 155 mg/dL — AB (ref 70–99)
POTASSIUM: 3.7 meq/L (ref 3.5–5.1)
SODIUM: 142 meq/L (ref 135–145)
Total Bilirubin: 1.1 mg/dL (ref 0.2–1.2)
Total Protein: 6.5 g/dL (ref 6.0–8.3)

## 2018-04-08 LAB — LIPID PANEL
CHOL/HDL RATIO: 3
CHOLESTEROL: 100 mg/dL (ref 0–200)
HDL: 31.3 mg/dL — ABNORMAL LOW (ref >39.00)
LDL CALC: 51 mg/dL (ref 0–99)
NonHDL: 68.64
TRIGLYCERIDES: 88 mg/dL (ref 0.0–149.0)
VLDL: 17.6 mg/dL (ref 0.0–40.0)

## 2018-04-08 LAB — TSH: TSH: 3.11 u[IU]/mL (ref 0.35–4.50)

## 2018-04-08 LAB — HEMOGLOBIN A1C: Hgb A1c MFr Bld: 6.8 % — ABNORMAL HIGH (ref 4.6–6.5)

## 2018-04-08 NOTE — Progress Notes (Signed)
PCP notes:   Health maintenance:  Flu vaccine - administered  Abnormal screenings:   Fall risk - hx of multiple falls Fall Risk  04/08/2018 04/26/2017 02/04/2017 12/30/2015 08/06/2014  Falls in the past year? Yes Yes Yes No Yes  Comment 6 falls due to impaired balance/gait; last fall resulted in left knee injury - - - -  Number falls in past yr: 2 or more 2 or more 1 - 2 or more  Injury with Fall? Yes No Yes - -  Risk Factor Category  High Fall Risk - High Fall Risk - -  Risk for fall due to : Impaired balance/gait;Impaired mobility Other (Comment) - - -  Risk for fall due to: Comment - going up steps - - -  Follow up - - Education provided;Falls prevention discussed - -    Patient concerns:   None  Nurse concerns:  None  Next PCP appt:   04/11/18 @ 1015  I reviewed health advisor's note, was available for consultation, and agree with documentation and plan. Loura Pardon MD

## 2018-04-08 NOTE — Patient Instructions (Signed)
Ms. Mackintosh , Thank you for taking time to come for your Medicare Wellness Visit. I appreciate your ongoing commitment to your health goals. Please review the following plan we discussed and let me know if I can assist you in the future.   These are the goals we discussed: Goals    . Patient Stated     Starting 04/08/2018, I will continue to take medications as prescribed.        This is a list of the screening recommended for you and due dates:  Health Maintenance  Topic Date Due  . Mammogram  08/10/2020*  . DEXA scan (bone density measurement)  08/10/2020*  . Tetanus Vaccine  08/10/2020*  . Eye exam for diabetics  08/13/2018  . Complete foot exam   08/16/2018  . Hemoglobin A1C  10/08/2018  . Flu Shot  Completed  . Pneumonia vaccines  Completed  *Topic was postponed. The date shown is not the original due date.   Preventive Care for Adults  A healthy lifestyle and preventive care can promote health and wellness. Preventive health guidelines for adults include the following key practices.  . A routine yearly physical is a good way to check with your health care provider about your health and preventive screening. It is a chance to share any concerns and updates on your health and to receive a thorough exam.  . Visit your dentist for a routine exam and preventive care every 6 months. Brush your teeth twice a day and floss once a day. Good oral hygiene prevents tooth decay and gum disease.  . The frequency of eye exams is based on your age, health, family medical history, use  of contact lenses, and other factors. Follow your health care provider's recommendations for frequency of eye exams.  . Eat a healthy diet. Foods like vegetables, fruits, whole grains, low-fat dairy products, and lean protein foods contain the nutrients you need without too many calories. Decrease your intake of foods high in solid fats, added sugars, and salt. Eat the right amount of calories for you. Get  information about a proper diet from your health care provider, if necessary.  . Regular physical exercise is one of the most important things you can do for your health. Most adults should get at least 150 minutes of moderate-intensity exercise (any activity that increases your heart rate and causes you to sweat) each week. In addition, most adults need muscle-strengthening exercises on 2 or more days a week.  Silver Sneakers may be a benefit available to you. To determine eligibility, you may visit the website: www.silversneakers.com or contact program at 984-728-5330 Mon-Fri between 8AM-8PM.   . Maintain a healthy weight. The body mass index (BMI) is a screening tool to identify possible weight problems. It provides an estimate of body fat based on height and weight. Your health care provider can find your BMI and can help you achieve or maintain a healthy weight.   For adults 20 years and older: ? A BMI below 18.5 is considered underweight. ? A BMI of 18.5 to 24.9 is normal. ? A BMI of 25 to 29.9 is considered overweight. ? A BMI of 30 and above is considered obese.   . Maintain normal blood lipids and cholesterol levels by exercising and minimizing your intake of saturated fat. Eat a balanced diet with plenty of fruit and vegetables. Blood tests for lipids and cholesterol should begin at age 66 and be repeated every 5 years. If your lipid or cholesterol  levels are high, you are over 50, or you are at high risk for heart disease, you may need your cholesterol levels checked more frequently. Ongoing high lipid and cholesterol levels should be treated with medicines if diet and exercise are not working.  . If you smoke, find out from your health care provider how to quit. If you do not use tobacco, please do not start.  . If you choose to drink alcohol, please do not consume more than 2 drinks per day. One drink is considered to be 12 ounces (355 mL) of beer, 5 ounces (148 mL) of wine, or 1.5  ounces (44 mL) of liquor.  . If you are 62-50 years old, ask your health care provider if you should take aspirin to prevent strokes.  . Use sunscreen. Apply sunscreen liberally and repeatedly throughout the day. You should seek shade when your shadow is shorter than you. Protect yourself by wearing long sleeves, pants, a wide-brimmed hat, and sunglasses year round, whenever you are outdoors.  . Once a month, do a whole body skin exam, using a mirror to look at the skin on your back. Tell your health care provider of new moles, moles that have irregular borders, moles that are larger than a pencil eraser, or moles that have changed in shape or color.

## 2018-04-08 NOTE — Progress Notes (Signed)
Subjective:   Stacey Harris is a 84 y.o. female who presents for Medicare Annual (Subsequent) preventive examination.  Review of Systems:  N/A Cardiac Risk Factors include: advanced age (>60mn, >>68women);hypertension;diabetes mellitus;dyslipidemia     Objective:     Vitals: BP 110/70 (BP Location: Right Arm, Patient Position: Sitting, Cuff Size: Normal)   Pulse (!) 55   Temp 97.8 F (36.6 C) (Oral)   Ht 5' 2"  (1.575 m) Comment: shoes  Wt 165 lb 8 oz (75.1 kg)   SpO2 97%   BMI 30.27 kg/m   Body mass index is 30.27 kg/m.  Advanced Directives 04/08/2018 06/06/2017 06/04/2017 02/04/2017 03/05/2016 12/30/2015 10/24/2015  Does Patient Have a Medical Advance Directive? Yes Yes Yes No - Yes Yes  Type of [place]amed[place]of [place] will Living will;Healthcare Power of Attorney Living will;Healthcare Power of [place] Does patient want to make changes to medical advance directive? - - - - - No - Patient declined -  Copy of [place] Chart? No - copy requested No - copy requested - - - No - copy requested -  Would patient like information on creating a medical advance directive? - - - Yes (MAU/Ambulatory/Procedural Areas - Information given) - - -  Pre-existing out of facility DNR order (yellow form or pink MOST form) - - - - - - -    Tobacco Social History   Tobacco Use  Smoking Status Never Smoker  Smokeless Tobacco Never Used     Counseling given: No   Clinical Intake:  Pre-visit preparation completed: Yes  Pain : No/denies pain Pain Score: 0-No pain     Nutritional Status: BMI > 30  Obese Nutritional Risks: None Diabetes: Yes CBG done?: No Did pt. bring in CBG monitor from home?: No  How often do you need to have someone help you when you read instructions, pamphlets, or other written materials from your doctor or pharmacy?: 2 -  Rarely What is the last grade level you completed in school?: 12th grade + 2 yrs college  Interpreter Needed?: No  Comments: pt lives with daughter EMargaretha SheffieldInformation entered by :: Stacey Harris  Past Medical History:  Diagnosis Date  . Blood transfusion   . [place]    basal cell skin CA  . Diabetes mellitus    type II  . Hyperlipidemia   . Hypertension   . Hypothyroidism   . OA (osteoarthritis)    Past Surgical History:  Procedure Laterality Date  . ANKLE FRACTURE SURGERY  10   rt  . CARPAL TUNNEL RELEASE Right 06/06/2017   Procedure: RIGHT CARPAL TUNNEL RELEASE;  Surgeon: Stacey Harris;  [facility]  Service: Orthopedics;  Laterality: Right;  . CATARACT EXTRACTION W/ INTRAOCULAR LENS  IMPLANT, BILATERAL Bilateral 2019  . JOINT REPLACEMENT Right 2010   rt total hip 10  . LUMBAR LAMINECTOMY/DECOMPRESSION MICRODISCECTOMY  06/22/2011   Procedure: LUMBAR LAMINECTOMY/DECOMPRESSION MICRODISCECTOMY;  Surgeon: MMarybelle Harris  [facility]  Service: Orthopedics;  Laterality: N/A;  L3-4, L4-5 Decompression  . MOHS SURGERY  12/08   basal cell skin cancer lt temple  . TAH      and BSO, age 84 . TONSILLECTOMY     Family History  Problem Relation Age of Onset  . Arthritis Father        RA  . Hypertension Father   .  Heart disease Father        CAD  . Diabetes Father   . Arthritis Sister        RA  . Diabetes Brother   . Cancer Brother   . Diabetes Brother   . Cancer Brother   . Diabetes Brother   . Cancer Brother   . Diabetes Sister   . Cancer Brother    Social History   Socioeconomic History  . Marital status: Widowed    Spouse name: Not on file  . Number of children: 4  . Years of education: 74  . Highest education level: Not on file  Occupational History  . Not on file  Social Needs  . Financial resource strain: Not on file  . Food insecurity:    Worry: Not on file    Inability: Not on file  . Transportation needs:     Medical: Not on file    Non-medical: Not on file  Tobacco Use  . Smoking status: Never Smoker  . Smokeless tobacco: Never Used  Substance and Sexual Activity  . Alcohol use: No    Alcohol/week: 0.0 standard drinks  . Drug use: No  . Sexual activity: Never    Birth control/protection: Post-menopausal  Lifestyle  . Physical activity:    Days per week: Not on file    Minutes per session: Not on file  . Stress: Not on file  Relationships  . Social connections:    Talks on phone: Not on file    Gets together: Not on file    Attends religious service: Not on file    Active member of club or organization: Not on file    Attends meetings of clubs or organizations: Not on file    Relationship status: Not on file  Other Topics Concern  . Not on file  Social History Narrative   Lives with daughter   No caffeine    Outpatient Encounter Medications as of 04/08/2018  Medication Sig  . aspirin 81 MG tablet Take 81 mg by mouth daily.    . Blood Glucose Calibration (ACCU-CHEK SMARTVIEW CONTROL) LIQD   . Blood Glucose Monitoring Suppl (ACCU-CHEK AVIVA PLUS) w/Device KIT Check blood sugar once daily and as directed.Dx E11.43  . diazepam (VALIUM) 5 MG tablet Take 0.5 tablets (2.5 mg total) by mouth at bedtime as needed.  [name] glimepiride (AMARYL) 4 MG tablet TAKE 1 TABLET EVERY DAY BEFORE BREAKFAST  . glucose blood (ACCU-CHEK AVIVA PLUS) test strip Check blood sugar once daily and as directed.Dx E11.43  . hydrochlorothiazide (HYDRODIURIL) 25 MG tablet Take 1 tablet (25 mg total) by mouth every morning.  [name] levothyroxine (SYNTHROID, LEVOTHROID) 50 MCG tablet Take 1 tablet (50 mcg total) by mouth daily.  [name] losartan (COZAAR) 50 MG tablet Take 1 tablet (50 mg total) by mouth daily.  . metFORMIN (GLUCOPHAGE) 1000 MG tablet TAKE 1 TABLET BY MOUTH TWICE A DAY WITH A MEAL  . metoprolol succinate (TOPROL-XL) 100 MG 24 hr tablet Take 1 tablet (100 mg total) by mouth daily. Take with or immediately following  a meal.  . Multiple Vitamin (MULTIVITAMIN) capsule Take 1 capsule by mouth daily.  . Omega-3 Fatty Acids (FISH OIL) 1200 MG CAPS Take 1,200 mg by mouth 2 (two) times daily.    . rosuvastatin (CRESTOR) 10 MG tablet Take 1 tablet (10 mg total) by mouth daily.   No facility-administered encounter medications on file as of 04/08/2018.     Activities of Daily Living In  your present state of health, do you have any difficulty performing the following activities: 04/08/2018  Hearing? Y  Vision? N  Difficulty concentrating or making decisions? Y  Walking or climbing stairs? Y  Dressing or bathing? N  Doing errands, shopping? Y  Preparing Food and eating ? N  Using the Toilet? N  In the past six months, have you accidently leaked urine? Y  Do you have problems with loss of bowel control? N  Managing your Medications? Y  Managing your Finances? Y  Housekeeping or managing your Housekeeping? Y  Some recent data might be hidden    Patient Care Team: Tower, Wynelle [name], Harris as PCP - Lillia Pauls, Harris as Consulting Physician (Ophthalmology)    Assessment:   This is a routine wellness examination for Kaleiyah.  Hearing Screening Comments: Bilateral hearing aids Vision Screening Comments: Vision exam in 2019 with Dr. Herbert Deaner   Exercise Activities and Dietary recommendations Current Exercise Habits: The patient does not participate in regular exercise at present, Exercise limited by: None identified  Goals    . Patient Stated     Starting 04/08/2018, I will continue to take medications as prescribed.        Fall Risk Fall Risk  04/08/2018 04/26/2017 02/04/2017 12/30/2015 08/06/2014  Falls in the past year? Yes Yes Yes No Yes  Comment 6 falls due to impaired balance/gait; last fall resulted in left knee injury - - - -  Number falls in past yr: 2 or more 2 or more 1 - 2 or more  Injury with Fall? Yes No Yes - -  Risk Factor Category  High Fall Risk - High Fall Risk - -  Risk for fall due  to : Impaired balance/gait;Impaired mobility Other (Comment) - - -  Risk for fall due to: Comment - going up steps - - -  Follow up - - Education provided;Falls prevention discussed - -   Depression Screen PHQ 2/9 Scores 04/08/2018 02/04/2017 12/30/2015 08/06/2014  PHQ - 2 Score 1 1 0 0  PHQ- 9 Score 1 8 - -     Cognitive Function MMSE - Mini Mental State Exam 04/08/2018 02/04/2017 12/30/2015  Orientation to time 5 4 5   Orientation to Place 5 4 5   Registration 3 3 3   Attention/ Calculation 0 0 0  Recall 3 2 3   Language- name 2 objects 0 0 0  Language- repeat 1 1 1   Language- follow 3 step command 3 3 3   Language- read & follow direction 0 0 0  Write a sentence 0 0 0  Copy design 0 0 0  Total score 20 17 20         Immunization History  Administered Date(s) Administered  . H1N1 06/17/2008  . Influenza Split 05/25/2011, 04/28/2012  . Influenza Whole 04/16/2006, 05/14/2007  . Influenza, High Dose Seasonal PF 05/20/2015  . Influenza,inj,Quad PF,6+ Mos 05/21/2013, 04/28/2014, 04/08/2018  . Influenza-Unspecified 03/30/2016, 04/02/2017  . Pneumococcal Conjugate-13 02/24/2016  . Pneumococcal Polysaccharide-23 07/09/2002  . Td 07/09/2002  . Zoster 02/15/2010   Screening Tests Health Maintenance  Topic Date Due  . MAMMOGRAM  08/10/2020 (Originally 03/09/2006)  . DEXA SCAN  08/10/2020 (Originally 05/08/1996)  . TETANUS/TDAP  08/10/2020 (Originally 07/09/2012)  . OPHTHALMOLOGY EXAM  08/13/2018  . FOOT EXAM  08/16/2018  . HEMOGLOBIN A1C  10/08/2018  . INFLUENZA VACCINE  Completed  . PNA vac Low Risk Adult  Completed      Plan:     I have personally  reviewed, addressed, and noted the following in the patient's chart:  A. Medical and social history B. Use of alcohol, tobacco or illicit drugs  C. Current medications and supplements D. Functional ability and status E.  Nutritional status F.  Physical activity G. Advance directives H. List of other physicians I.  Hospitalizations,  surgeries, and ER visits in previous 12 months J.  [place] to include hearing, vision, cognitive, depression L. Referrals and appointments - none  In addition, I have reviewed and discussed with patient certain preventive protocols, quality metrics, and best practice recommendations. A written personalized care plan for preventive services as well as general preventive health recommendations were provided to patient.  See attached scanned questionnaire for additional information.   Signed,   Lindell Noe, MHA, BS, [facility] Coach

## 2018-04-10 LAB — HM DIABETES EYE EXAM

## 2018-04-11 ENCOUNTER — Ambulatory Visit (INDEPENDENT_AMBULATORY_CARE_PROVIDER_SITE_OTHER): Payer: Medicare HMO | Admitting: Family Medicine

## 2018-04-11 ENCOUNTER — Encounter: Payer: Self-pay | Admitting: Family Medicine

## 2018-04-11 VITALS — BP 134/66 | HR 67 | Temp 98.3°F | Ht 62.0 in | Wt 165.5 lb

## 2018-04-11 DIAGNOSIS — E039 Hypothyroidism, unspecified: Secondary | ICD-10-CM | POA: Diagnosis not present

## 2018-04-11 DIAGNOSIS — Z0000 Encounter for general adult medical examination without abnormal findings: Secondary | ICD-10-CM

## 2018-04-11 DIAGNOSIS — E1143 Type 2 diabetes mellitus with diabetic autonomic (poly)neuropathy: Secondary | ICD-10-CM | POA: Diagnosis not present

## 2018-04-11 DIAGNOSIS — R7989 Other specified abnormal findings of blood chemistry: Secondary | ICD-10-CM

## 2018-04-11 DIAGNOSIS — D696 Thrombocytopenia, unspecified: Secondary | ICD-10-CM | POA: Diagnosis not present

## 2018-04-11 DIAGNOSIS — D469 Myelodysplastic syndrome, unspecified: Secondary | ICD-10-CM | POA: Diagnosis not present

## 2018-04-11 DIAGNOSIS — R300 Dysuria: Secondary | ICD-10-CM | POA: Diagnosis not present

## 2018-04-11 DIAGNOSIS — E7800 Pure hypercholesterolemia, unspecified: Secondary | ICD-10-CM

## 2018-04-11 DIAGNOSIS — I10 Essential (primary) hypertension: Secondary | ICD-10-CM

## 2018-04-11 DIAGNOSIS — K219 Gastro-esophageal reflux disease without esophagitis: Secondary | ICD-10-CM

## 2018-04-11 DIAGNOSIS — Z9181 History of falling: Secondary | ICD-10-CM

## 2018-04-11 LAB — POC URINALSYSI DIPSTICK (AUTOMATED)
Bilirubin, UA: NEGATIVE
Blood, UA: NEGATIVE
Glucose, UA: NEGATIVE
KETONES UA: NEGATIVE
Leukocytes, UA: NEGATIVE
Nitrite, UA: NEGATIVE
PH UA: 6.5 (ref 5.0–8.0)
Protein, UA: NEGATIVE
Spec Grav, UA: 1.015 (ref 1.010–1.025)
Urobilinogen, UA: 4 E.U./dL — AB

## 2018-04-11 MED ORDER — LOSARTAN POTASSIUM 50 MG PO TABS: 50.0000 mg | ORAL_TABLET | Freq: Every day | ORAL | 3 refills | Status: DC

## 2018-04-11 MED ORDER — METFORMIN HCL 1000 MG PO TABS: ORAL_TABLET | ORAL | 3 refills | Status: DC

## 2018-04-11 MED ORDER — HYDROCHLOROTHIAZIDE 25 MG PO TABS: 25.0000 mg | ORAL_TABLET | Freq: Every morning | ORAL | 3 refills | Status: DC

## 2018-04-11 MED ORDER — ROSUVASTATIN CALCIUM 10 MG PO TABS: 10.0000 mg | ORAL_TABLET | Freq: Every day | ORAL | 3 refills | Status: DC

## 2018-04-11 MED ORDER — METOPROLOL SUCCINATE ER 100 MG PO TB24: 100.0000 mg | ORAL_TABLET | Freq: Every day | ORAL | 3 refills | Status: DC

## 2018-04-11 MED ORDER — LEVOTHYROXINE SODIUM 50 MCG PO TABS: 50.0000 ug | ORAL_TABLET | Freq: Every day | ORAL | 3 refills | Status: DC

## 2018-04-11 MED ORDER — GLIMEPIRIDE 4 MG PO TABS: ORAL_TABLET | ORAL | 3 refills | Status: DC

## 2018-04-11 NOTE — Assessment & Plan Note (Signed)
Stable cbc  Pt stays cold tolerant- ? If in part due to anemia  Hematology only recommends f/u if big change Continue to check cbc every 6 mo

## 2018-04-11 NOTE — Progress Notes (Signed)
Subjective:    Patient ID: Stacey Harris, female    DOB: 1928/10/30, 84 y.o.   MRN: [id number]  HPI  Here for health maintenance exam and to review chronic medical problems    Wt Readings from Last 3 Encounters:  04/11/18 165 lb 8 oz (75.1 kg)  04/08/18 165 lb 8 oz (75.1 kg)  01/14/18 163 lb (73.9 kg)   30.27 kg/m   Generally does not feel well  Skips some meals  Often nauseated - sometimes takes pills w/o food  She thinks that she may have acid reflux - some pain/ throat /chest area  occ takes pepcid ac and it    amw was 10/1 Had her flu shot  Noted multiple falls this year with bad balance and mobility impairment  Knee hurts after her falls   Colonoscopy nl 5/08  Declines mammograms Self breast exams-no lumps    Declines dexa -still does  Multiple falls No recent fractures   Eye exam 2/19 Then another one yesterday  Wants a urine check  She has frequency at night  occ burns to urinate  No blood in urine   Results for orders placed or performed in visit on 04/11/18  POCT Urinalysis Dipstick (Automated)  Result Value Ref Range   Color, UA Yellow    Clarity, UA Clear    Glucose, UA Negative Negative   Bilirubin, UA Negative    Ketones, UA Negative    Spec Grav, UA 1.015 1.010 - 1.025   Blood, UA Negative    pH, UA 6.5 5.0 - 8.0   Protein, UA Negative Negative   Urobilinogen, UA 4.0 (A) 0.2 or 1.0 E.U./dL   Nitrite, UA Negative    Leukocytes, UA Negative Negative  HM DIABETES EYE EXAM  Result Value Ref Range   HM Diabetic Eye Exam No Retinopathy No Retinopathy       zostavax 8/11  Hypothyroidism  Pt has no clinical changes No change in energy level/ hair or skin/ edema and no tremor Lab Results  Component Value Date   TSH 3.11 04/08/2018     bp is stable today  No cp or palpitations or headaches or edema  No side effects to medicines  BP Readings from Last 3 Encounters:  04/11/18 134/66  04/08/18 110/70  01/14/18 (!) 156/76       DM2 Lab Results  Component Value Date   HGBA1C 6.8 (H) 04/08/2018  amaryl Losartan  Metformin  Is not eating regularly  Sweets -once in a while    Cholesterol Lab Results  Component Value Date   CHOL 100 04/08/2018   CHOL 124 08/12/2017   CHOL 99 02/04/2017   Lab Results  Component Value Date   HDL 31.30 (L) 04/08/2018   HDL 39.40 08/12/2017   HDL 28.50 (L) 02/04/2017   Lab Results  Component Value Date   LDLCALC 51 04/08/2018   LDLCALC 67 08/12/2017   LDLCALC 53 02/04/2017   Lab Results  Component Value Date   TRIG 88.0 04/08/2018   TRIG 86.0 08/12/2017   TRIG 86.0 02/04/2017   Lab Results  Component Value Date   CHOLHDL 3 04/08/2018   CHOLHDL 3 08/12/2017   CHOLHDL 3 02/04/2017   Lab Results  Component Value Date   LDLDIRECT 202.7 08/17/2010   On crestor  Not as active "I can't walk" -due to pain- so HDL is lower   Hx of myelodysplastic disorder Lab Results  Component Value Date   WBC 6.8 04/08/2018  HGB 10.7 (L) 04/08/2018   HCT 31.4 (L) 04/08/2018   MCV 100.8 (H) 04/08/2018   PLT 106.0 (L) 04/08/2018   Saw hematology in the past We now monitor Q 6 mo   Lab Results  Component Value Date   CREATININE 1.30 (H) 04/08/2018   BUN 29 (H) 04/08/2018   NA 142 04/08/2018   K 3.7 04/08/2018   CL 108 04/08/2018   CO2 25 04/08/2018   Lab Results  Component Value Date   ALT 21 04/08/2018   AST 34 04/08/2018   ALKPHOS 60 04/08/2018   BILITOT 1.1 04/08/2018    Patient Active Problem List   Diagnosis Date Noted  . Elevated serum creatinine 04/11/2018  . Dysuria 11/12/2017  . SOB (shortness of breath) on exertion 11/12/2017  . History of fall 08/27/2016  . Controlled type 2 diabetes mellitus with diabetic autonomic neuropathy ([place]) 02/24/2016  . Routine general medical examination at a health care facility 02/12/2016  . MDS (myelodysplastic syndrome) ([place]) 10/24/2015  . Normocytic anemia 02/11/2015  . GERD (gastroesophageal reflux  disease) 08/06/2014  . Encounter for Medicare annual wellness exam 08/03/2013  . Thrombocytopenia ([place]) 08/03/2013  . Insomnia 01/26/2013  . Hypothyroid 09/17/2011  . Lumbar spinal stenosis 06/21/2011    Class: Present on Admission  . BACK PAIN, LUMBAR 08/23/2010  . POSTNASAL DRIP 08/23/2010  . OSTEOARTHRITIS, HIP 03/16/2009  . BASAL CELL CARCINOMA, FACE 07/01/2007  . BASAL CELL CARCINOMA, FACE 07/01/2007  . DIABETIC PERIPHERAL NEUROPATHY 09/24/2006  . Hyperlipidemia 09/24/2006  . Essential hypertension 09/24/2006  . ROSACEA 09/24/2006  . INCONTINENCE, URGE 09/24/2006  . COLONOSCOPY AND REMOVAL OF LESION, HX OF 06/09/2003   Past Medical History:  Diagnosis Date  . Blood transfusion   . Cancer ([place])    basal cell skin CA  . Diabetes mellitus    type II  . Hyperlipidemia   . Hypertension   . Hypothyroidism   . OA (osteoarthritis)    Past Surgical History:  Procedure Laterality Date  . ANKLE FRACTURE SURGERY  10   rt  . CARPAL TUNNEL RELEASE Right 06/06/2017   Procedure: RIGHT CARPAL TUNNEL RELEASE;  Surgeon: Daryll Brod, MD;  Location: [facility];  Service: Orthopedics;  Laterality: Right;  . CATARACT EXTRACTION W/ INTRAOCULAR LENS  IMPLANT, BILATERAL Bilateral 2019  . JOINT REPLACEMENT Right 2010   rt total hip 10  . LUMBAR LAMINECTOMY/DECOMPRESSION MICRODISCECTOMY  06/22/2011   Procedure: LUMBAR LAMINECTOMY/DECOMPRESSION MICRODISCECTOMY;  Surgeon: Marybelle Killings;  Location: [facility];  Service: Orthopedics;  Laterality: N/A;  L3-4, L4-5 Decompression  . MOHS SURGERY  12/08   basal cell skin cancer lt temple  . TAH      and BSO, age 36  . TONSILLECTOMY     Social History   Tobacco Use  . Smoking status: Never Smoker  . Smokeless tobacco: Never Used  Substance Use Topics  . Alcohol use: No    Alcohol/week: 0.0 standard drinks  . Drug use: No   Family History  Problem Relation Age of Onset  . Arthritis Father        RA  . Hypertension Father   .  Heart disease Father        CAD  . Diabetes Father   . Arthritis Sister        RA  . Diabetes Brother   . Cancer Brother   . Diabetes Brother   . Cancer Brother   . Diabetes Brother   . Cancer Brother   .  Diabetes Sister   . Cancer Brother    Allergies  Allergen Reactions  . Keflex [Cephalexin]     Rash    . Mavik [Trandolapril]     Cough    Current Outpatient Medications on File Prior to Visit  Medication Sig Dispense Refill  . aspirin 81 MG tablet Take 81 mg by mouth daily.      . Blood Glucose Calibration (ACCU-CHEK SMARTVIEW CONTROL) LIQD     . Blood Glucose Monitoring Suppl (ACCU-CHEK AVIVA PLUS) w/Device KIT Check blood sugar once daily and as directed.Dx E11.43 1 kit 0  . diazepam (VALIUM) 5 MG tablet Take 0.5 tablets (2.5 mg total) by mouth at bedtime as needed. 30 tablet 0  . glucose blood (ACCU-CHEK AVIVA PLUS) test strip Check blood sugar once daily and as directed.Dx E11.43 100 each 3  . Multiple Vitamin (MULTIVITAMIN) capsule Take 1 capsule by mouth daily.    . Omega-3 Fatty Acids (FISH OIL) 1200 MG CAPS Take 1,200 mg by mouth 2 (two) times daily.       No current facility-administered medications on file prior to visit.     Review of Systems  Constitutional: Positive for fatigue. Negative for activity change, appetite change, fever and unexpected weight change.  HENT: Negative for congestion, ear pain, rhinorrhea, sinus pressure and sore throat.   Eyes: Negative for pain, redness and visual disturbance.  Respiratory: Negative for cough, shortness of breath and wheezing.   Cardiovascular: Negative for chest pain and palpitations.  Gastrointestinal: Positive for nausea. Negative for abdominal pain, blood in stool, constipation, diarrhea and vomiting.  Endocrine: Negative for polydipsia and polyuria.  Genitourinary: Positive for dysuria, frequency and urgency. Negative for hematuria.       Some incontinence  Musculoskeletal: Positive for arthralgias, back pain  and gait problem. Negative for joint swelling and myalgias.  Skin: Negative for pallor and rash.  Allergic/Immunologic: Negative for environmental allergies.  Neurological: Negative for dizziness, syncope and headaches.       Poor balance with h/o falls   Hematological: Negative for adenopathy. Does not bruise/bleed easily.  Psychiatric/Behavioral: Negative for decreased concentration and dysphoric mood. The patient is nervous/anxious.        Objective:   Physical Exam  Constitutional: She is oriented to person, place, and time. She appears well-developed and well-nourished. No distress.  overwt frail appearing elderly female  HENT:  Head: Normocephalic and atraumatic.  Right Ear: External ear normal.  Left Ear: External ear normal.  Nose: Nose normal.  Mouth/Throat: Oropharynx is clear and moist.  Eyes: Pupils are equal, round, and reactive to light. Conjunctivae and EOM are normal. Right eye exhibits no discharge. Left eye exhibits no discharge. No scleral icterus.  Neck: Normal range of motion. Neck supple. No thyromegaly present.  Cardiovascular: Normal rate, regular rhythm and normal heart sounds.  Pulmonary/Chest: Effort normal and breath sounds normal. No stridor. No respiratory distress. She has no wheezes.  Abdominal: Soft. Bowel sounds are normal. She exhibits no distension and no mass. There is no tenderness.  Genitourinary:  Genitourinary Comments: Breast exam: No mass, nodules, thickening, tenderness, bulging, retraction, inflamation, nipple discharge or skin changes noted.  No axillary or clavicular LA.      Musculoskeletal: She exhibits tenderness. She exhibits no edema.  Mild kyphosis  Poor rom of LS with tenderness  Lymphadenopathy:    She has no cervical adenopathy.  Neurological: She is alert and oriented to person, place, and time. She displays normal reflexes. No cranial nerve deficit.  Coordination normal.  Gait is slow   Skin: Skin is warm and dry. No rash  noted. No erythema. No pallor.  SKs diffusely  Psychiatric: Her speech is normal and behavior is normal. Thought content normal. Her mood appears anxious. She exhibits a depressed mood.  Seems generally down/ negative- disc challenges of feeling bad  Also anxious  Helpful family present          Assessment & Plan:   Problem List Items Addressed This Visit      Cardiovascular and Mediastinum   Essential hypertension    bp in fair control at this time  BP Readings from Last 1 Encounters:  04/11/18 134/66   No changes needed Most recent labs reviewed  Disc lifstyle change with low sodium diet and exercise        Relevant Medications   metoprolol succinate (TOPROL-XL) 100 MG 24 hr tablet   rosuvastatin (CRESTOR) 10 MG tablet   losartan (COZAAR) 50 MG tablet   hydrochlorothiazide (HYDRODIURIL) 25 MG tablet   Other Relevant Orders   Comprehensive metabolic panel     Digestive   GERD (gastroesophageal reflux disease)    With nausea and occ burning  pepcid ac helps prn - enc her to use as needed Also to avoid taking all of her am meds on an empty stomach        Endocrine   Controlled type 2 diabetes mellitus with diabetic autonomic neuropathy (HCC)    Lab Results  Component Value Date   HGBA1C 6.8 (H) 04/08/2018   This is improved Reminded pt to please eat regular meals (risk of hypoglycemia on sulfonurea)  Exercise as tolerated Arb for renal protection  Nl foot exam (sensation was nl today) Disc eye care -just had eye exam        Relevant Medications   rosuvastatin (CRESTOR) 10 MG tablet   metFORMIN (GLUCOPHAGE) 1000 MG tablet   losartan (COZAAR) 50 MG tablet   glimepiride (AMARYL) 4 MG tablet   Other Relevant Orders   Hemoglobin A1c   Hypothyroid    Hypothyroidism  Pt has no clinical changes No change in energy level/ hair or skin/ edema and no tremor Lab Results  Component Value Date   TSH 3.11 04/08/2018          Relevant Medications    metoprolol succinate (TOPROL-XL) 100 MG 24 hr tablet   levothyroxine (SYNTHROID, LEVOTHROID) 50 MCG tablet   Other Relevant Orders   Lipid panel     Other   Dysuria    UA today -clear Hx of uti  Enc increase in fluid intake       Relevant Orders   POCT Urinalysis Dipstick (Automated) (Completed)   Elevated serum creatinine    Enc better fluid intake  Also UA      Relevant Orders   CBC with Differential/Platelet   History of fall    Multiple falls with poor balance and neuropathy  Also chronic back pain  Pt declines PT ref  Strongly suggested use of walker- daughter voiced understanding   L knee-sore since last fall/ will use cold compress and f/u if no imp       Hyperlipidemia    Disc goals for lipids and reasons to control them Rev last labs with pt Rev low sat fat diet in detail Continue crestor       Relevant Medications   metoprolol succinate (TOPROL-XL) 100 MG 24 hr tablet   rosuvastatin (CRESTOR) 10 MG tablet  losartan (COZAAR) 50 MG tablet   hydrochlorothiazide (HYDRODIURIL) 25 MG tablet   MDS (myelodysplastic syndrome) (HCC)    Stable cbc  Pt stays cold tolerant- ? If in part due to anemia  Hematology only recommends f/u if big change Continue to check cbc every 6 mo       Relevant Orders   CBC with Differential/Platelet   Routine general medical examination at a health care facility - Primary    Reviewed health habits including diet and exercise and skin cancer prevention Reviewed appropriate screening tests for age  Also reviewed health mt list, fam hx and immunization status , as well as social and family history   See HPI Declines mammogram  Declines dexa  Enc better fluid intake Enc inc vit D (2000 iu daily) for bone health  Disc fall prev in detail -enc use of walker  Severe chronic back pain limits self care       Thrombocytopenia (HCC)    Platelet ct 106 with no clinical changes Fairly stable

## 2018-04-11 NOTE — Assessment & Plan Note (Signed)
bp in fair control at this time  BP Readings from Last 1 Encounters:  04/11/18 134/66   No changes needed Most recent labs reviewed  Disc lifstyle change with low sodium diet and exercise

## 2018-04-11 NOTE — Assessment & Plan Note (Signed)
Multiple falls with poor balance and neuropathy  Also chronic back pain  Pt declines PT ref  Strongly suggested use of walker- daughter voiced understanding   L knee-sore since last fall/ will use cold compress and f/u if no imp

## 2018-04-11 NOTE — Assessment & Plan Note (Addendum)
UA today -clear Hx of uti  Enc increase in fluid intake

## 2018-04-11 NOTE — Patient Instructions (Addendum)
Try to take medicines with food   Try not to skip meals- your blood sugar may get too low   pepcid ac is ok daily for reflux   An inexpensive walker is a good thing to have on hand if you feel like balance is bad some days and if you are out of your regular environment  Also there is physical therapy for balance - it you want to try that in the future please let me know   Add 2000 iu of vitamin D daily (even with the multi vitamin)   Please try to drink more fluids- mostly water  Sugar free is ok  Aim for 64 oz day  This is for kidney function   Please follow up in 6 months with labs prior   Urine test today

## 2018-04-11 NOTE — Assessment & Plan Note (Signed)
Disc goals for lipids and reasons to control them Rev last labs with pt Rev low sat fat diet in detail Continue crestor

## 2018-04-11 NOTE — Assessment & Plan Note (Signed)
Hypothyroidism  Pt has no clinical changes No change in energy level/ hair or skin/ edema and no tremor Lab Results  Component Value Date   TSH 3.11 04/08/2018

## 2018-04-11 NOTE — Assessment & Plan Note (Addendum)
Lab Results  Component Value Date   HGBA1C 6.8 (H) 04/08/2018   This is improved Reminded pt to please eat regular meals (risk of hypoglycemia on sulfonurea)  Exercise as tolerated Arb for renal protection  Nl foot exam (sensation was nl today) Disc eye care -just had eye exam

## 2018-04-11 NOTE — Assessment & Plan Note (Signed)
Enc better fluid intake  Also UA

## 2018-04-11 NOTE — Assessment & Plan Note (Signed)
Reviewed health habits including diet and exercise and skin cancer prevention Reviewed appropriate screening tests for age  Also reviewed health mt list, fam hx and immunization status , as well as social and family history   See HPI Declines mammogram  Declines dexa  Enc better fluid intake Enc inc vit D (2000 iu daily) for bone health  Disc fall prev in detail -enc use of walker  Severe chronic back pain limits self care

## 2018-04-11 NOTE — Assessment & Plan Note (Signed)
With nausea and occ burning  pepcid ac helps prn - enc her to use as needed Also to avoid taking all of her am meds on an empty stomach

## 2018-04-11 NOTE — Assessment & Plan Note (Signed)
Re assuring foot exam No doubt affect balance

## 2018-04-12 NOTE — Assessment & Plan Note (Signed)
Platelet ct 106 with no clinical changes Fairly stable

## 2018-04-25 ENCOUNTER — Telehealth: Payer: Self-pay | Admitting: Family Medicine

## 2018-04-25 NOTE — Telephone Encounter (Signed)
Pt's daughter notified of Dr. Marliss Coots comments and instructions and verbalized understanding pt hasn't had any sxs of being sick that family was aware of so appt scheduled on 04/29/18 to discuss memory

## 2018-04-25 NOTE — Telephone Encounter (Signed)
Copied from [place] [id number]. Topic: Quick Communication - See Telephone Encounter >> Apr 25, 2018  8:56 AM Ahmed Prima L wrote: CRM for notification. See Telephone encounter for: 04/25/18.  Patient's daughter, [name] called and states she brought her in two weeks ago and had her checked for a UTI and it came back negative. She said that she has been confused ever since, does not know how to dress herself. She wants to know is this a progression of her age or does she need to come back in? Call back @ [phone]

## 2018-04-25 NOTE — Telephone Encounter (Signed)
Follow up next week to discuss mental status changes  We can do a question answer exam to assess for cognitive status/memory if needed Also re check urine if needed  Make sure she comes with a family member who can tell me what is going on  Make sure she is eating regularly and getting enough fluids Let us know if fever or any other symptoms - cough/urinary/etc Thanks

## 2018-04-29 ENCOUNTER — Ambulatory Visit (INDEPENDENT_AMBULATORY_CARE_PROVIDER_SITE_OTHER): Payer: Medicare HMO | Admitting: Family Medicine

## 2018-04-29 ENCOUNTER — Encounter: Payer: Self-pay | Admitting: Family Medicine

## 2018-04-29 VITALS — BP 132/70 | HR 63 | Temp 97.7°F | Ht 62.0 in | Wt 166.0 lb

## 2018-04-29 DIAGNOSIS — R350 Frequency of micturition: Secondary | ICD-10-CM | POA: Diagnosis not present

## 2018-04-29 DIAGNOSIS — R410 Disorientation, unspecified: Secondary | ICD-10-CM | POA: Diagnosis not present

## 2018-04-29 DIAGNOSIS — R300 Dysuria: Secondary | ICD-10-CM

## 2018-04-29 LAB — POC URINALSYSI DIPSTICK (AUTOMATED)
Bilirubin, UA: NEGATIVE
Glucose, UA: NEGATIVE
Ketones, UA: NEGATIVE
NITRITE UA: POSITIVE
PH UA: 6 (ref 5.0–8.0)
Protein, UA: NEGATIVE
SPEC GRAV UA: 1.02 (ref 1.010–1.025)
Urobilinogen, UA: 2 E.U./dL — AB

## 2018-04-29 NOTE — Progress Notes (Signed)
Subjective:    Patient ID: Stacey Harris, female    DOB: 09-12-1928, 84 y.o.   MRN: [id number]  HPI Here to discuss memory problems   Wt Readings from Last 3 Encounters:  04/29/18 166 lb (75.3 kg)  04/11/18 165 lb 8 oz (75.1 kg)  04/08/18 165 lb 8 oz (75.1 kg)   30.36 kg/m   Noted general malaise at last visit  Seemed depressed as well  Also bad balance and mobility impaired (declined PT ref for this)  ua was clear (occ symptomatic)  After this her daughter called -noting more confused since then (3 d after)  Having trouble dressing herself  Progressed up until 3 d ago-now back to normal  ? If age related / ua was neg at that time  No stroke symptoms at all (no neuro changes) Just a vacant look  Suspected dehydration- more fluids helped   Daughter gave her a clock draw test today and did it perfectly   This episode correlated with daughter leaving town  Came home on Friday - still bad for a week and then started getting better   There is a possibility that medicines were mis dosed (saw one pill that was doubled)   Did start using cranberry extract   UA today Results for orders placed or performed in visit on 04/29/18  POCT Urinalysis Dipstick (Automated)  Result Value Ref Range   Color, UA Yellow    Clarity, UA Cloudy    Glucose, UA Negative Negative   Bilirubin, UA Negative    Ketones, UA Negative    Spec Grav, UA 1.020 1.010 - 1.025   Blood, UA Trace    pH, UA 6.0 5.0 - 8.0   Protein, UA Negative Negative   Urobilinogen, UA 2.0 (A) 0.2 or 1.0 E.U./dL   Nitrite, UA Positive    Leukocytes, UA Moderate (2+) (A) Negative      Last labs Lab Results  Component Value Date   WBC 6.8 04/08/2018   HGB 10.7 (L) 04/08/2018   HCT 31.4 (L) 04/08/2018   MCV 100.8 (H) 04/08/2018   PLT 106.0 (L) 04/08/2018  (MDS baseline) Lab Results  Component Value Date   CREATININE 1.30 (H) 04/08/2018   BUN 29 (H) 04/08/2018   NA 142 04/08/2018   K 3.7 04/08/2018   CL  108 04/08/2018   CO2 25 04/08/2018   Lab Results  Component Value Date   TSH 3.11 04/08/2018     Today she feels fine  No fever  No signs of illness   Patient Active Problem List   Diagnosis Date Noted  . Confusion 04/29/2018  . Frequent urination 04/29/2018  . Elevated serum creatinine 04/11/2018  . Dysuria 11/12/2017  . SOB (shortness of breath) on exertion 11/12/2017  . History of fall 08/27/2016  . Controlled type 2 diabetes mellitus with diabetic autonomic neuropathy ([name]) 02/24/2016  . Routine general medical examination at a health care facility 02/12/2016  . MDS (myelodysplastic syndrome) ([place]) 10/24/2015  . Normocytic anemia 02/11/2015  . GERD (gastroesophageal reflux disease) 08/06/2014  . Encounter for Medicare annual wellness exam 08/03/2013  . Thrombocytopenia ([place]) 08/03/2013  . Insomnia 01/26/2013  . Hypothyroid 09/17/2011  . Lumbar spinal stenosis 06/21/2011    Class: Present on Admission  . BACK PAIN, LUMBAR 08/23/2010  . POSTNASAL DRIP 08/23/2010  . OSTEOARTHRITIS, HIP 03/16/2009  . BASAL CELL CARCINOMA, FACE 07/01/2007  . BASAL CELL CARCINOMA, FACE 07/01/2007  . DIABETIC PERIPHERAL NEUROPATHY 09/24/2006  . Hyperlipidemia  09/24/2006  . Essential hypertension 09/24/2006  . ROSACEA 09/24/2006  . INCONTINENCE, URGE 09/24/2006  . COLONOSCOPY AND REMOVAL OF LESION, HX OF 06/09/2003   Past Medical History:  Diagnosis Date  . Blood transfusion   . Cancer ([place])    basal cell skin CA  . Diabetes mellitus    type II  . Hyperlipidemia   . Hypertension   . Hypothyroidism   . OA (osteoarthritis)    Past Surgical History:  Procedure Laterality Date  . ANKLE FRACTURE SURGERY  10   rt  . CARPAL TUNNEL RELEASE Right 06/06/2017   Procedure: RIGHT CARPAL TUNNEL RELEASE;  Surgeon: Daryll Brod, MD;  Location: [facility];  Service: Orthopedics;  Laterality: Right;  . CATARACT EXTRACTION W/ INTRAOCULAR LENS  IMPLANT, BILATERAL Bilateral 2019  .  JOINT REPLACEMENT Right 2010   rt total hip 10  . LUMBAR LAMINECTOMY/DECOMPRESSION MICRODISCECTOMY  06/22/2011   Procedure: LUMBAR LAMINECTOMY/DECOMPRESSION MICRODISCECTOMY;  Surgeon: Marybelle Killings;  Location: [facility];  Service: Orthopedics;  Laterality: N/A;  L3-4, L4-5 Decompression  . MOHS SURGERY  12/08   basal cell skin cancer lt temple  . TAH      and BSO, age 80  . TONSILLECTOMY     Social History   Tobacco Use  . Smoking status: Never Smoker  . Smokeless tobacco: Never Used  Substance Use Topics  . Alcohol use: No    Alcohol/week: 0.0 standard drinks  . Drug use: No   Family History  Problem Relation Age of Onset  . Arthritis Father        RA  . Hypertension Father   . Heart disease Father        CAD  . Diabetes Father   . Arthritis Sister        RA  . Diabetes Brother   . Cancer Brother   . Diabetes Brother   . Cancer Brother   . Diabetes Brother   . Cancer Brother   . Diabetes Sister   . Cancer Brother    Allergies  Allergen Reactions  . Keflex [Cephalexin]     Rash    . Mavik [Trandolapril]     Cough    Current Outpatient Medications on File Prior to Visit  Medication Sig Dispense Refill  . Blood Glucose Calibration (ACCU-CHEK SMARTVIEW CONTROL) LIQD     . Blood Glucose Monitoring Suppl (ACCU-CHEK AVIVA PLUS) w/Device KIT Check blood sugar once daily and as directed.Dx E11.43 1 kit 0  . Cholecalciferol (VITAMIN D PO) Take 5,000 Units by mouth daily.    [name] CRANBERRY PO Take 1 tablet by mouth daily.    . diazepam (VALIUM) 5 MG tablet Take 0.5 tablets (2.5 mg total) by mouth at bedtime as needed. 30 tablet 0  . glimepiride (AMARYL) 4 MG tablet TAKE 1 TABLET EVERY DAY BEFORE BREAKFAST 90 tablet 3  . glucose blood (ACCU-CHEK AVIVA PLUS) test strip Check blood sugar once daily and as directed.Dx E11.43 100 each 3  . hydrochlorothiazide (HYDRODIURIL) 25 MG tablet Take 1 tablet (25 mg total) by mouth every morning. 90 tablet 3  . levothyroxine (SYNTHROID,  LEVOTHROID) 50 MCG tablet Take 1 tablet (50 mcg total) by mouth daily. 90 tablet 3  . losartan (COZAAR) 50 MG tablet Take 1 tablet (50 mg total) by mouth daily. 90 tablet 3  . metFORMIN (GLUCOPHAGE) 1000 MG tablet TAKE 1 TABLET BY MOUTH TWICE A DAY WITH A MEAL 180 tablet 3  . metoprolol succinate (TOPROL-XL)  100 MG 24 hr tablet Take 1 tablet (100 mg total) by mouth daily. Take with or immediately following a meal. 90 tablet 3  . Multiple Vitamin (MULTIVITAMIN) capsule Take 1 capsule by mouth daily.    . Omega-3 Fatty Acids (FISH OIL) 1200 MG CAPS Take 1,200 mg by mouth 2 (two) times daily.      . rosuvastatin (CRESTOR) 10 MG tablet Take 1 tablet (10 mg total) by mouth daily. 90 tablet 3   No current facility-administered medications on file prior to visit.     Review of Systems  Constitutional: Negative for activity change, appetite change, fatigue, fever and unexpected weight change.  HENT: Negative for congestion, ear pain, rhinorrhea, sinus pressure and sore throat.   Eyes: Negative for pain, redness and visual disturbance.  Respiratory: Negative for cough, shortness of breath and wheezing.   Cardiovascular: Negative for chest pain and palpitations.  Gastrointestinal: Negative for abdominal pain, blood in stool, constipation and diarrhea.  Endocrine: Negative for polydipsia and polyuria.  Genitourinary: Positive for dysuria and frequency. Negative for urgency.       Urinary urgency/frequency come and go along with dysuria   Musculoskeletal: Negative for arthralgias, back pain and myalgias.  Skin: Negative for pallor and rash.  Allergic/Immunologic: Negative for environmental allergies.  Neurological: Negative for dizziness, tremors, seizures, syncope, facial asymmetry, speech difficulty, weakness, light-headedness, numbness and headaches.       Confusion is now resolved   Hematological: Negative for adenopathy. Does not bruise/bleed easily.  Psychiatric/Behavioral: Positive for  confusion. Negative for decreased concentration and dysphoric mood. The patient is not nervous/anxious.        Objective:   Physical Exam  Constitutional: She appears well-developed and well-nourished. No distress.  Well appearing elderly female Hard of hearing   HENT:  Head: Normocephalic and atraumatic.  Mouth/Throat: Oropharynx is clear and moist.  Eyes: Pupils are equal, round, and reactive to light. Conjunctivae and EOM are normal.  Neck: Normal range of motion. Neck supple. No JVD present. Carotid bruit is not present. No thyromegaly present.  Cardiovascular: Normal rate, regular rhythm, normal heart sounds and intact distal pulses. Exam reveals no gallop.  Pulmonary/Chest: Effort normal and breath sounds normal. No stridor. No respiratory distress. She has no wheezes. She has no rales.  No crackles  Abdominal: Soft. Bowel sounds are normal. She exhibits no distension, no abdominal bruit and no mass. There is no tenderness.  Musculoskeletal: She exhibits tenderness. She exhibits no edema or deformity.  L knee is tender -s/p injury last week No bruising or swelling  Lymphadenopathy:    She has no cervical adenopathy.  Neurological: She is alert. She has normal reflexes. She displays normal reflexes. No cranial nerve deficit or sensory deficit. She exhibits normal muscle tone. Coordination normal.  No neuro deficits today  Also mentally sharp  Skin: Skin is warm and dry. No rash noted. No pallor.  Psychiatric: She has a normal mood and affect.  Mentally sharp today  Also in a better mood          Assessment & Plan:   Problem List Items Addressed This Visit      Nervous and Auditory   Confusion - Primary    Episode of confusion / poss delirium that is now resolved  Reassuring exam incl neuro exam  Mentally sharp and good mood today  Differential incl uti/ other infection/ change in medication management or routine/ dehydration  Less likely stroke as she lacked any  corresponding symptoms Rev  recent labs ua prior was neg-will re check today /suspect she may have uti  Enc good water intake       Relevant Orders   Urine Culture     Other   Dysuria    Intermittent with frequency Hard to tell when she has a uti  UA today       Relevant Orders   POCT Urinalysis Dipstick (Automated) (Completed)   Urine Culture   Frequent urination   Relevant Orders   POCT Urinalysis Dipstick (Automated) (Completed)   Urine Culture

## 2018-04-29 NOTE — Assessment & Plan Note (Signed)
Intermittent with frequency Hard to tell when she has a uti  UA today

## 2018-04-29 NOTE — Patient Instructions (Signed)
A number of things could cause temporary confusion including a uti / dehydration / medication error or change in schedule   Let's check a UA today (we will call you)   Watch for re occurrence of symptoms  Watch the pill box Keep hydrated   Exam and vitals are very re assuring today

## 2018-04-29 NOTE — Assessment & Plan Note (Signed)
Episode of confusion / poss delirium that is now resolved  Reassuring exam incl neuro exam  Mentally sharp and good mood today  Differential incl uti/ other infection/ change in medication management or routine/ dehydration  Less likely stroke as she lacked any corresponding symptoms Rev recent labs ua prior was neg-will re check today /suspect she may have uti  Enc good water intake

## 2018-04-30 ENCOUNTER — Ambulatory Visit: Payer: Medicare HMO | Admitting: Family Medicine

## 2018-05-01 ENCOUNTER — Telehealth: Payer: Self-pay | Admitting: *Deleted

## 2018-05-01 LAB — URINE CULTURE
MICRO NUMBER: 91268839
SPECIMEN QUALITY: ADEQUATE

## 2018-05-01 MED ORDER — SULFAMETHOXAZOLE-TRIMETHOPRIM 800-160 MG PO TABS: 1.0000 | ORAL_TABLET | Freq: Two times a day (BID) | ORAL | 0 refills | Status: DC

## 2018-05-01 NOTE — Telephone Encounter (Signed)
-----   Message from [name], MD sent at 05/01/2018  8:02 AM EDT ----- E coli in urine- the sensitivities are not back here but I may not be avail when they return  Please send in bactrim DS 1 po bid for 7d #14 no ref  Drink water  Will let them know when I get sensitivities to know if this is the right abx Let me know if any problems

## 2018-05-01 NOTE — Telephone Encounter (Signed)
Daughter notified Rx sent to pharmacy and advised of Dr. Tower's comments  

## 2018-05-22 ENCOUNTER — Telehealth: Payer: Self-pay | Admitting: *Deleted

## 2018-05-22 MED ORDER — LOSARTAN POTASSIUM 25 MG PO TABS: 50.0000 mg | ORAL_TABLET | Freq: Every day | ORAL | 3 refills | Status: DC

## 2018-05-22 NOTE — Telephone Encounter (Signed)
Received fax from [facility] saying that losartan 50mg  is on back order, they do have 25mg  and 100mg , so they are requesting you send in a new Rx with 25mg  (2 tabs daily) or 100mg  (1/2 tab daily), [facility] [address] please advise

## 2018-05-22 NOTE — Telephone Encounter (Signed)
done

## 2018-06-02 DIAGNOSIS — G5603 Carpal tunnel syndrome, bilateral upper limbs: Secondary | ICD-10-CM | POA: Diagnosis not present

## 2018-09-17 ENCOUNTER — Other Ambulatory Visit: Payer: Self-pay

## 2018-09-17 ENCOUNTER — Ambulatory Visit (INDEPENDENT_AMBULATORY_CARE_PROVIDER_SITE_OTHER): Payer: Medicare HMO | Admitting: Family Medicine

## 2018-09-17 ENCOUNTER — Encounter: Payer: Self-pay | Admitting: Family Medicine

## 2018-09-17 VITALS — BP 132/72 | HR 75 | Temp 97.9°F | Ht 62.0 in | Wt 166.6 lb

## 2018-09-17 DIAGNOSIS — D469 Myelodysplastic syndrome, unspecified: Secondary | ICD-10-CM

## 2018-09-17 DIAGNOSIS — Y92009 Unspecified place in unspecified non-institutional (private) residence as the place of occurrence of the external cause: Secondary | ICD-10-CM

## 2018-09-17 DIAGNOSIS — E1143 Type 2 diabetes mellitus with diabetic autonomic (poly)neuropathy: Secondary | ICD-10-CM

## 2018-09-17 DIAGNOSIS — R8290 Unspecified abnormal findings in urine: Secondary | ICD-10-CM

## 2018-09-17 DIAGNOSIS — N390 Urinary tract infection, site not specified: Secondary | ICD-10-CM | POA: Diagnosis not present

## 2018-09-17 DIAGNOSIS — W19XXXA Unspecified fall, initial encounter: Secondary | ICD-10-CM | POA: Diagnosis not present

## 2018-09-17 DIAGNOSIS — R410 Disorientation, unspecified: Secondary | ICD-10-CM

## 2018-09-17 LAB — POC URINALSYSI DIPSTICK (AUTOMATED)
Bilirubin, UA: NEGATIVE
Blood, UA: 25
Glucose, UA: NEGATIVE
Ketones, UA: NEGATIVE
Nitrite, UA: NEGATIVE
PH UA: 6.5 (ref 5.0–8.0)
Protein, UA: POSITIVE — AB
Spec Grav, UA: 1.015 (ref 1.010–1.025)
Urobilinogen, UA: 2 E.U./dL — AB

## 2018-09-17 MED ORDER — SULFAMETHOXAZOLE-TRIMETHOPRIM 800-160 MG PO TABS: 1.0000 | ORAL_TABLET | Freq: Two times a day (BID) | ORAL | 0 refills | Status: DC

## 2018-09-17 NOTE — Assessment & Plan Note (Signed)
Pending ua (could not give sample here)-suspect strongly given her confusion that she has uti  Bactrim ds px to start as soon as sample is given cx will be sent  Family to push fluids for pt and watch closely for falls ? If candidate for proph abx in the future

## 2018-09-17 NOTE — Assessment & Plan Note (Signed)
Strongly suspect uti  Family to get [place] urine sample (could not give here) and then begin bactrim ds while waiting for cx

## 2018-09-17 NOTE — Assessment & Plan Note (Signed)
Last week/ during suspected uti at daughter's house Bruise on arm-no injuries Enc family to mandate walker use when she feels poorly

## 2018-09-17 NOTE — Patient Instructions (Signed)
Aim for much more fluid - whatever you like  In a perfect world 64 oz per day   As soon as you can get a urine sample - please start the antibiotic as directed We will alert you when we get a culture back   When confusion hits - falls will increase - please prompt to use a walker  We cannot risk another fall

## 2018-09-17 NOTE — Addendum Note (Signed)
Addended by: Tammi Sou on: 09/17/2018 03:37 PM   Modules accepted: Orders

## 2018-09-17 NOTE — Assessment & Plan Note (Signed)
Lab Results  Component Value Date   HGBA1C 6.8 (H) 04/08/2018

## 2018-09-17 NOTE — Assessment & Plan Note (Signed)
Checking cbc Q 6 mo No clinical changes

## 2018-09-17 NOTE — Progress Notes (Signed)
Subjective:    Patient ID: Stacey Harris, female    DOB: Apr 02, 1929, 85 y.o.   MRN: [id number]  HPI  Here with confusion -possible uti   2 weeks ago -pt was confused / family gave her cephalexin at a lower dosage  Suspected uti but could not get her in  Took it for at least a week  Taking cranberry pills 2 daily /also inc fluids Got much better   Yesterday -confusion again/ today worse (forgot to put on some of her clothes)  Nonsense  speech  Said "I feel rotten"  Just not herself   Is tired and sleeps a lot   No stroke symptoms  Tends to drag her feet    Does not c/o of dysuria  Has baseline frequency and incontinence  Wears adult diaper (forgets to change them)  Urine smells stronger Maybe a bit of blood in urine once (hard to tell)-in her undergarment  No fever  No cough or uri symptoms         Also had a fall yesterday - at her other daughter's -different place   Wt Readings from Last 3 Encounters:  09/17/18 166 lb 9 oz (75.6 kg)  04/29/18 166 lb (75.3 kg)  04/11/18 165 lb 8 oz (75.1 kg)   30.46 kg/m    Last uti grew e coli -pan sensitive    Lab Results  Component Value Date   CREATININE 1.30 (H) 04/08/2018   BUN 29 (H) 04/08/2018   NA 142 04/08/2018   K 3.7 04/08/2018   CL 108 04/08/2018   CO2 25 04/08/2018  family is pushing her to drink fluids   Patient Active Problem List   Diagnosis Date Noted  . Recurrent UTI (urinary tract infection) 09/17/2018  . Fall at home 09/17/2018  . Confusion 04/29/2018  . Frequent urination 04/29/2018  . Elevated serum creatinine 04/11/2018  . Dysuria 11/12/2017  . SOB (shortness of breath) on exertion 11/12/2017  . History of fall 08/27/2016  . Controlled type 2 diabetes mellitus with diabetic autonomic neuropathy ([place]) 02/24/2016  . Routine general medical examination at a health care facility 02/12/2016  . MDS (myelodysplastic syndrome) ([place]) 10/24/2015  . Normocytic anemia 02/11/2015  . GERD  (gastroesophageal reflux disease) 08/06/2014  . Encounter for Medicare annual wellness exam 08/03/2013  . Thrombocytopenia ([place]) 08/03/2013  . Insomnia 01/26/2013  . Hypothyroid 09/17/2011  . Lumbar spinal stenosis 06/21/2011    Class: Present on Admission  . BACK PAIN, LUMBAR 08/23/2010  . POSTNASAL DRIP 08/23/2010  . OSTEOARTHRITIS, HIP 03/16/2009  . BASAL CELL CARCINOMA, FACE 07/01/2007  . BASAL CELL CARCINOMA, FACE 07/01/2007  . DIABETIC PERIPHERAL NEUROPATHY 09/24/2006  . Hyperlipidemia 09/24/2006  . Essential hypertension 09/24/2006  . ROSACEA 09/24/2006  . INCONTINENCE, URGE 09/24/2006  . COLONOSCOPY AND REMOVAL OF LESION, HX OF 06/09/2003   Past Medical History:  Diagnosis Date  . Blood transfusion   . Cancer ([place])    basal cell skin CA  . Diabetes mellitus    type II  . Hyperlipidemia   . Hypertension   . Hypothyroidism   . OA (osteoarthritis)    Past Surgical History:  Procedure Laterality Date  . ANKLE FRACTURE SURGERY  10   rt  . CARPAL TUNNEL RELEASE Right 06/06/2017   Procedure: RIGHT CARPAL TUNNEL RELEASE;  Surgeon: Daryll Brod, MD;  Location: [facility];  Service: Orthopedics;  Laterality: Right;  . CATARACT EXTRACTION W/ INTRAOCULAR LENS  IMPLANT, BILATERAL Bilateral 2019  .  JOINT REPLACEMENT Right 2010   rt total hip 10  . LUMBAR LAMINECTOMY/DECOMPRESSION MICRODISCECTOMY  06/22/2011   Procedure: LUMBAR LAMINECTOMY/DECOMPRESSION MICRODISCECTOMY;  Surgeon: Marybelle Killings;  Location: [facility];  Service: Orthopedics;  Laterality: N/A;  L3-4, L4-5 Decompression  . MOHS SURGERY  12/08   basal cell skin cancer lt temple  . TAH      and BSO, age 32  . TONSILLECTOMY     Social History   Tobacco Use  . Smoking status: Never Smoker  . Smokeless tobacco: Never Used  Substance Use Topics  . Alcohol use: No    Alcohol/week: 0.0 standard drinks  . Drug use: No   Family History  Problem Relation Age of Onset  . Arthritis Father        RA  .  Hypertension Father   . Heart disease Father        CAD  . Diabetes Father   . Arthritis Sister        RA  . Diabetes Brother   . Cancer Brother   . Diabetes Brother   . Cancer Brother   . Diabetes Brother   . Cancer Brother   . Diabetes Sister   . Cancer Brother    Allergies  Allergen Reactions  . Keflex [Cephalexin]     Did ok with lower dose, higher dose caused a rash    . Mavik [Trandolapril]     Cough    Current Outpatient Medications on File Prior to Visit  Medication Sig Dispense Refill  . Blood Glucose Calibration (ACCU-CHEK SMARTVIEW CONTROL) LIQD     . Blood Glucose Monitoring Suppl (ACCU-CHEK AVIVA PLUS) w/Device KIT Check blood sugar once daily and as directed.Dx E11.43 1 kit 0  . Cholecalciferol (VITAMIN D PO) Take 5,000 Units by mouth daily.    [name] CRANBERRY PO Take 1 tablet by mouth daily.    . diazepam (VALIUM) 5 MG tablet Take 0.5 tablets (2.5 mg total) by mouth at bedtime as needed. 30 tablet 0  . glimepiride (AMARYL) 4 MG tablet TAKE 1 TABLET EVERY DAY BEFORE BREAKFAST 90 tablet 3  . glucose blood (ACCU-CHEK AVIVA PLUS) test strip Check blood sugar once daily and as directed.Dx E11.43 100 each 3  . hydrochlorothiazide (HYDRODIURIL) 25 MG tablet Take 1 tablet (25 mg total) by mouth every morning. 90 tablet 3  . levothyroxine (SYNTHROID, LEVOTHROID) 50 MCG tablet Take 1 tablet (50 mcg total) by mouth daily. 90 tablet 3  . losartan (COZAAR) 25 MG tablet Take 2 tablets (50 mg total) by mouth daily. 90 tablet 3  . metFORMIN (GLUCOPHAGE) 1000 MG tablet TAKE 1 TABLET BY MOUTH TWICE A DAY WITH A MEAL 180 tablet 3  . metoprolol succinate (TOPROL-XL) 100 MG 24 hr tablet Take 1 tablet (100 mg total) by mouth daily. Take with or immediately following a meal. 90 tablet 3  . Multiple Vitamin (MULTIVITAMIN) capsule Take 1 capsule by mouth daily.    . Omega-3 Fatty Acids (FISH OIL) 1200 MG CAPS Take 1,200 mg by mouth 2 (two) times daily.      . rosuvastatin (CRESTOR) 10 MG  tablet Take 1 tablet (10 mg total) by mouth daily. 90 tablet 3   No current facility-administered medications on file prior to visit.     Review of Systems  Constitutional: Negative for activity change, appetite change, fatigue, fever and unexpected weight change.  HENT: Negative for congestion, ear pain, rhinorrhea, sinus pressure and sore throat.   Eyes: Negative for  pain, redness and visual disturbance.  Respiratory: Negative for cough, shortness of breath and wheezing.   Cardiovascular: Negative for chest pain and palpitations.  Gastrointestinal: Negative for abdominal pain, blood in stool, constipation and diarrhea.  Endocrine: Negative for polydipsia and polyuria.  Genitourinary: Negative for dysuria, frequency and urgency.  Musculoskeletal: Negative for arthralgias, back pain and myalgias.  Skin: Negative for pallor and rash.  Allergic/Immunologic: Negative for environmental allergies.  Neurological: Negative for dizziness, syncope and headaches.  Hematological: Negative for adenopathy. Does not bruise/bleed easily.  Psychiatric/Behavioral: Positive for confusion and decreased concentration. Negative for agitation and dysphoric mood. The patient is not nervous/anxious.        Objective:   Physical Exam Constitutional:      General: She is not in acute distress.    Appearance: Normal appearance. She is well-developed. She is obese. She is not ill-appearing.     Comments: More confused today  HENT:     Head: Normocephalic and atraumatic.     Mouth/Throat:     Mouth: Mucous membranes are moist.     Pharynx: Oropharynx is clear.  Eyes:     Conjunctiva/sclera: Conjunctivae normal.     Pupils: Pupils are equal, round, and reactive to light.  Neck:     Musculoskeletal: Normal range of motion and neck supple.     Thyroid: No thyromegaly.     Vascular: No carotid bruit or JVD.  Cardiovascular:     Rate and Rhythm: Normal rate and regular rhythm.     Heart sounds: Normal heart  sounds. No gallop.   Pulmonary:     Effort: Pulmonary effort is normal. No respiratory distress.     Breath sounds: Normal breath sounds. No wheezing or rales.  Abdominal:     General: Bowel sounds are normal. There is no distension or abdominal bruit.     Palpations: Abdomen is soft. There is no mass.     Tenderness: There is no abdominal tenderness.     Hernia: No hernia is present.     Comments: No suprapubic tenderness or fullness   No cva tenderness   Lymphadenopathy:     Cervical: No cervical adenopathy.  Skin:    General: Skin is warm and dry.     Findings: No rash.  Neurological:     General: No focal deficit present.     Mental Status: She is alert.     Deep Tendon Reflexes: Reflexes are normal and symmetric.  Psychiatric:        Speech: Speech is tangential.        Cognition and Memory: Cognition is impaired.     Comments: Confused today  Pleasant -not anxious             Assessment & Plan:   Problem List Items Addressed This Visit      Endocrine   Controlled type 2 diabetes mellitus with diabetic autonomic neuropathy ([place])    Lab Results  Component Value Date   HGBA1C 6.8 (H) 04/08/2018           Nervous and Auditory   Confusion    Strongly suspect uti  Family to get [place] urine sample (could not give here) and then begin bactrim ds while waiting for cx        Genitourinary   Recurrent UTI (urinary tract infection) - Primary    Pending ua (could not give sample here)-suspect strongly given her confusion that she has uti  Bactrim ds px to start as soon  as sample is given cx will be sent  Family to push fluids for pt and watch closely for falls ? If candidate for proph abx in the future       Relevant Medications   sulfamethoxazole-trimethoprim (BACTRIM DS,SEPTRA DS) 800-160 MG tablet     Other   MDS (myelodysplastic syndrome) (HCC)    Checking cbc Q 6 mo No clinical changes       Relevant Medications   sulfamethoxazole-trimethoprim  (BACTRIM DS,SEPTRA DS) 800-160 MG tablet   Fall at home    Last week/ during suspected uti at daughter's house Bruise on arm-no injuries Enc family to mandate walker use when she feels poorly

## 2018-09-19 LAB — URINE CULTURE
MICRO NUMBER:: 305916
SPECIMEN QUALITY:: ADEQUATE

## 2018-10-09 ENCOUNTER — Other Ambulatory Visit: Payer: Self-pay

## 2018-10-09 ENCOUNTER — Other Ambulatory Visit (INDEPENDENT_AMBULATORY_CARE_PROVIDER_SITE_OTHER): Payer: Medicare HMO

## 2018-10-09 DIAGNOSIS — R7989 Other specified abnormal findings of blood chemistry: Secondary | ICD-10-CM

## 2018-10-09 DIAGNOSIS — D469 Myelodysplastic syndrome, unspecified: Secondary | ICD-10-CM

## 2018-10-09 DIAGNOSIS — I10 Essential (primary) hypertension: Secondary | ICD-10-CM | POA: Diagnosis not present

## 2018-10-09 DIAGNOSIS — E039 Hypothyroidism, unspecified: Secondary | ICD-10-CM | POA: Diagnosis not present

## 2018-10-09 DIAGNOSIS — E1143 Type 2 diabetes mellitus with diabetic autonomic (poly)neuropathy: Secondary | ICD-10-CM

## 2018-10-09 LAB — COMPREHENSIVE METABOLIC PANEL
ALT: 19 U/L (ref 0–35)
AST: 32 U/L (ref 0–37)
Albumin: 3.2 g/dL — ABNORMAL LOW (ref 3.5–5.2)
Alkaline Phosphatase: 66 U/L (ref 39–117)
BUN: 28 mg/dL — ABNORMAL HIGH (ref 6–23)
CO2: 25 mEq/L (ref 19–32)
Calcium: 9.3 mg/dL (ref 8.4–10.5)
Chloride: 108 mEq/L (ref 96–112)
Creatinine, Ser: 1.42 mg/dL — ABNORMAL HIGH (ref 0.40–1.20)
GFR: 34.96 mL/min — ABNORMAL LOW (ref >60.00)
Glucose, Bld: 150 mg/dL — ABNORMAL HIGH (ref 70–99)
Potassium: 3.8 mEq/L (ref 3.5–5.1)
Sodium: 142 mEq/L (ref 135–145)
Total Bilirubin: 0.8 mg/dL (ref 0.2–1.2)
Total Protein: 6.3 g/dL (ref 6.0–8.3)

## 2018-10-09 LAB — CBC WITH DIFFERENTIAL/PLATELET
Basophils Absolute: 0.1 10*3/uL (ref 0.0–0.1)
Basophils Relative: 1.3 % (ref 0.0–3.0)
Eosinophils Absolute: 0.2 10*3/uL (ref 0.0–0.7)
Eosinophils Relative: 3.1 % (ref 0.0–5.0)
HCT: 32.5 % — ABNORMAL LOW (ref 36.0–46.0)
Hemoglobin: 11.1 g/dL — ABNORMAL LOW (ref 12.0–15.0)
Lymphocytes Relative: 39.4 % (ref 12.0–46.0)
Lymphs Abs: 2 10*3/uL (ref 0.7–4.0)
MCHC: 34.1 g/dL (ref 30.0–36.0)
MCV: 100.5 fl — ABNORMAL HIGH (ref 78.0–100.0)
Monocytes Absolute: 0.5 10*3/uL (ref 0.1–1.0)
Monocytes Relative: 9.9 % (ref 3.0–12.0)
Neutro Abs: 2.4 10*3/uL (ref 1.4–7.7)
Neutrophils Relative %: 46.3 % (ref 43.0–77.0)
Platelets: 96 10*3/uL — ABNORMAL LOW (ref 150.0–400.0)
RBC: 3.23 Mil/uL — ABNORMAL LOW (ref 3.87–5.11)
RDW: 14.7 % (ref 11.5–15.5)
WBC: 5.1 10*3/uL (ref 4.0–10.5)

## 2018-10-09 LAB — LIPID PANEL
Cholesterol: 127 mg/dL (ref 0–200)
HDL: 27.1 mg/dL — ABNORMAL LOW (ref >39.00)
LDL Cholesterol: 82 mg/dL (ref 0–99)
NonHDL: 99.49
Total CHOL/HDL Ratio: 5
Triglycerides: 89 mg/dL (ref 0.0–149.0)
VLDL: 17.8 mg/dL (ref 0.0–40.0)

## 2018-10-09 LAB — HEMOGLOBIN A1C: Hgb A1c MFr Bld: 7.9 % — ABNORMAL HIGH (ref 4.6–6.5)

## 2018-10-13 ENCOUNTER — Encounter: Payer: Self-pay | Admitting: Family Medicine

## 2018-10-13 ENCOUNTER — Other Ambulatory Visit: Payer: Self-pay

## 2018-10-13 ENCOUNTER — Ambulatory Visit (INDEPENDENT_AMBULATORY_CARE_PROVIDER_SITE_OTHER): Payer: Medicare HMO | Admitting: Family Medicine

## 2018-10-13 DIAGNOSIS — E1143 Type 2 diabetes mellitus with diabetic autonomic (poly)neuropathy: Secondary | ICD-10-CM | POA: Diagnosis not present

## 2018-10-13 DIAGNOSIS — R7989 Other specified abnormal findings of blood chemistry: Secondary | ICD-10-CM

## 2018-10-13 DIAGNOSIS — D469 Myelodysplastic syndrome, unspecified: Secondary | ICD-10-CM

## 2018-10-13 DIAGNOSIS — D696 Thrombocytopenia, unspecified: Secondary | ICD-10-CM

## 2018-10-13 DIAGNOSIS — I10 Essential (primary) hypertension: Secondary | ICD-10-CM

## 2018-10-13 DIAGNOSIS — E7800 Pure hypercholesterolemia, unspecified: Secondary | ICD-10-CM

## 2018-10-13 MED ORDER — METFORMIN HCL 1000 MG PO TABS: ORAL_TABLET | ORAL | 0 refills | Status: DC

## 2018-10-13 NOTE — Assessment & Plan Note (Signed)
Lab Results  Component Value Date   HGBA1C 7.9 (H) 10/09/2018   This is up a point - likely because pt cut her pm dose of metformin  out due to diarrhea  Will try adding back just 500 mg of metformin in the evening (if diarrhea worsens they will call and we can try the XR form) Continue amaryl  Continue arb and statin also  Disc limiting sweets Plans on more outdoor activity soon F/u 6 mo or earlier if needed

## 2018-10-13 NOTE — Assessment & Plan Note (Signed)
Cr is 1.42 No symptoms of uti Making effort for fluids DM was worse this time  On metformin- may change that if this worsens (hesitant to at this point)  Will watch  Disc fluid intake

## 2018-10-13 NOTE — Progress Notes (Signed)
Virtual Visit via Video Note  I connected with [place] Bailiff on 10/13/18 at  9:00 AM EDT by a video enabled telemedicine application and verified that I am speaking with the correct person using two identifiers.  She is at home  I am at my office    I discussed the limitations of evaluation and management by telemedicine and the availability of in person appointments. The patient expressed understanding and agreed to proceed.    History of Present Illness: Visit for follow up of chronic health problems  Feeling like her normal self   HTN BP Readings from Last 3 Encounters:  09/17/18 132/72  04/29/18 132/70  04/11/18 134/66  no high or low bp at home    Lab Results  Component Value Date   CREATININE 1.42 (H) 10/09/2018   BUN 28 (H) 10/09/2018   NA 142 10/09/2018   K 3.8 10/09/2018   CL 108 10/09/2018   CO2 25 10/09/2018  making a good effort to drink fluids Water Diet citrus green tea  No problems with uti at all    Lab Results  Component Value Date   ALT 19 10/09/2018   AST 32 10/09/2018   ALKPHOS 66 10/09/2018   BILITOT 0.8 10/09/2018    DM2 Lab Results  Component Value Date   HGBA1C 7.9 (H) 10/09/2018  this is up from 6.8  Does not check her glucose at home  No episodes of feeling shaky or low  Not eating differently  Not getting much exercise  Did set up some garden boxes to do some gardening and yard work Eats sweets about once per day -for example some jelly beans  Not a lot of carbs   She cut her metformin to 1 in am (no pm)   Eye exam 10/19  Foot exam 10/19 Takes amaryl Metformin  Cozaar crestor   Cholesterol Lab Results  Component Value Date   CHOL 127 10/09/2018   CHOL 100 04/08/2018   CHOL 124 08/12/2017   Lab Results  Component Value Date   HDL 27.10 (L) 10/09/2018   HDL 31.30 (L) 04/08/2018   HDL 39.40 08/12/2017   Lab Results  Component Value Date   LDLCALC 82 10/09/2018   LDLCALC 51 04/08/2018   LDLCALC 67 08/12/2017    Lab Results  Component Value Date   TRIG 89.0 10/09/2018   TRIG 88.0 04/08/2018   TRIG 86.0 08/12/2017   Lab Results  Component Value Date   CHOLHDL 5 10/09/2018   CHOLHDL 3 04/08/2018   CHOLHDL 3 08/12/2017   Lab Results  Component Value Date   LDLDIRECT 202.7 08/17/2010     h/o myelodysplastic syndrome  Lab Results  Component Value Date   WBC 5.1 10/09/2018   HGB 11.1 (L) 10/09/2018   HCT 32.5 (L) 10/09/2018   MCV 100.5 (H) 10/09/2018   PLT 96.0 (L) 10/09/2018   Stable platelets and improved Hb No bleeding or bruising  No fatigue or other symptoms   Patient Active Problem List   Diagnosis Date Noted  . Recurrent UTI (urinary tract infection) 09/17/2018  . Fall at home 09/17/2018  . Frequent urination 04/29/2018  . Elevated serum creatinine 04/11/2018  . Dysuria 11/12/2017  . SOB (shortness of breath) on exertion 11/12/2017  . History of fall 08/27/2016  . Controlled type 2 diabetes mellitus with diabetic autonomic neuropathy ([place]) 02/24/2016  . Routine general medical examination at a health care facility 02/12/2016  . MDS (myelodysplastic syndrome) ([place]) 10/24/2015  .  Normocytic anemia 02/11/2015  . GERD (gastroesophageal reflux disease) 08/06/2014  . Encounter for Medicare annual wellness exam 08/03/2013  . Thrombocytopenia ([place]) 08/03/2013  . Insomnia 01/26/2013  . Hypothyroid 09/17/2011  . Lumbar spinal stenosis 06/21/2011    Class: Present on Admission  . BACK PAIN, LUMBAR 08/23/2010  . POSTNASAL DRIP 08/23/2010  . OSTEOARTHRITIS, HIP 03/16/2009  . BASAL CELL CARCINOMA, FACE 07/01/2007  . BASAL CELL CARCINOMA, FACE 07/01/2007  . DIABETIC PERIPHERAL NEUROPATHY 09/24/2006  . Hyperlipidemia 09/24/2006  . Essential hypertension 09/24/2006  . ROSACEA 09/24/2006  . INCONTINENCE, URGE 09/24/2006  . COLONOSCOPY AND REMOVAL OF LESION, HX OF 06/09/2003   Past Medical History:  Diagnosis Date  . Blood transfusion   . Cancer ([place])    basal cell skin CA   . Diabetes mellitus    type II  . Hyperlipidemia   . Hypertension   . Hypothyroidism   . OA (osteoarthritis)    Past Surgical History:  Procedure Laterality Date  . ANKLE FRACTURE SURGERY  10   rt  . CARPAL TUNNEL RELEASE Right 06/06/2017   Procedure: RIGHT CARPAL TUNNEL RELEASE;  Surgeon: Daryll Brod, MD;  Location: [facility];  Service: Orthopedics;  Laterality: Right;  . CATARACT EXTRACTION W/ INTRAOCULAR LENS  IMPLANT, BILATERAL Bilateral 2019  . JOINT REPLACEMENT Right 2010   rt total hip 10  . LUMBAR LAMINECTOMY/DECOMPRESSION MICRODISCECTOMY  06/22/2011   Procedure: LUMBAR LAMINECTOMY/DECOMPRESSION MICRODISCECTOMY;  Surgeon: Marybelle Killings;  Location: [facility];  Service: Orthopedics;  Laterality: N/A;  L3-4, L4-5 Decompression  . MOHS SURGERY  12/08   basal cell skin cancer lt temple  . TAH      and BSO, age 89  . TONSILLECTOMY     Social History   Tobacco Use  . Smoking status: Never Smoker  . Smokeless tobacco: Never Used  Substance Use Topics  . Alcohol use: No    Alcohol/week: 0.0 standard drinks  . Drug use: No   Family History  Problem Relation Age of Onset  . Arthritis Father        RA  . Hypertension Father   . Heart disease Father        CAD  . Diabetes Father   . Arthritis Sister        RA  . Diabetes Brother   . Cancer Brother   . Diabetes Brother   . Cancer Brother   . Diabetes Brother   . Cancer Brother   . Diabetes Sister   . Cancer Brother    Allergies  Allergen Reactions  . Keflex [Cephalexin]     Did ok with lower dose, higher dose caused a rash    . Mavik [Trandolapril]     Cough    Current Outpatient Medications on File Prior to Visit  Medication Sig Dispense Refill  . Blood Glucose Calibration (ACCU-CHEK SMARTVIEW CONTROL) LIQD     . Blood Glucose Monitoring Suppl (ACCU-CHEK AVIVA PLUS) w/Device KIT Check blood sugar once daily and as directed.Dx E11.43 1 kit 0  . Cholecalciferol (VITAMIN D PO) Take 5,000 Units by  mouth daily.    [name] CRANBERRY PO Take 1 tablet by mouth daily.    . diazepam (VALIUM) 5 MG tablet Take 0.5 tablets (2.5 mg total) by mouth at bedtime as needed. 30 tablet 0  . glimepiride (AMARYL) 4 MG tablet TAKE 1 TABLET EVERY DAY BEFORE BREAKFAST 90 tablet 3  . glucose blood (ACCU-CHEK AVIVA PLUS) test strip Check blood sugar once  daily and as directed.Dx E11.43 100 each 3  . hydrochlorothiazide (HYDRODIURIL) 25 MG tablet Take 1 tablet (25 mg total) by mouth every morning. 90 tablet 3  . levothyroxine (SYNTHROID, LEVOTHROID) 50 MCG tablet Take 1 tablet (50 mcg total) by mouth daily. 90 tablet 3  . losartan (COZAAR) 25 MG tablet Take 2 tablets (50 mg total) by mouth daily. 90 tablet 3  . metoprolol succinate (TOPROL-XL) 100 MG 24 hr tablet Take 1 tablet (100 mg total) by mouth daily. Take with or immediately following a meal. 90 tablet 3  . Multiple Vitamin (MULTIVITAMIN) capsule Take 1 capsule by mouth daily.    . Omega-3 Fatty Acids (FISH OIL) 1200 MG CAPS Take 1,200 mg by mouth 2 (two) times daily.      . rosuvastatin (CRESTOR) 10 MG tablet Take 1 tablet (10 mg total) by mouth daily. 90 tablet 3   No current facility-administered medications on file prior to visit.      Review of Systems  Constitutional: Negative for chills, fever and malaise/fatigue.  HENT: Negative for congestion.        No rhinorrhea  Eyes: Negative for discharge.  Respiratory: Negative for cough, shortness of breath and wheezing.   Cardiovascular: Negative for chest pain and palpitations.  Gastrointestinal: Positive for diarrhea. Negative for nausea.  Genitourinary: Negative for dysuria, hematuria and urgency.  Skin: Negative for rash.  Neurological: Negative for dizziness.  Psychiatric/Behavioral:       Mood is ok       Observations/Objective: Patient appears well  Groomed/ her normal self  No cough or hoarseness  Answers questions appropriately / normal cognition  Skin tone looks normal w/o rash  No  tremor  Helpful family present    Assessment and Plan: Problem List Items Addressed This Visit      Cardiovascular and Mediastinum   Essential hypertension    No changes at home  Enc good health habits  No change in tx        Endocrine   Controlled type 2 diabetes mellitus with diabetic autonomic neuropathy ([place]) - Primary    Lab Results  Component Value Date   HGBA1C 7.9 (H) 10/09/2018   This is up a point - likely because pt cut her pm dose of metformin  out due to diarrhea  Will try adding back just 500 mg of metformin in the evening (if diarrhea worsens they will call and we can try the XR form) Continue amaryl  Continue arb and statin also  Disc limiting sweets Plans on more outdoor activity soon F/u 6 mo or earlier if needed       Relevant Medications   metFORMIN (GLUCOPHAGE) 1000 MG tablet     Other   Hyperlipidemia    HDL down and LDL up a bit- less activity with recent stay at home guidelines Disc goals for lipids and reasons to control them Rev last labs with pt Rev low sat fat diet in detail  Continues crestor  Diet is fairly good  Expects to get outdoors and do more soon       Thrombocytopenia (HCC)    Stable at 96 H/o MDS No symptoms Continue to watch Q 6 mo       MDS (myelodysplastic syndrome) (HCC)    Hb is improved at 11.1  Platelets stable 96 Continue to check very 6 mo  No symptoms       Elevated serum creatinine    Cr is 1.42 No symptoms of uti  Making effort for fluids DM was worse this time  On metformin- may change that if this worsens (hesitant to at this point)  Will watch  Disc fluid intake             Follow Up Instructions: Continue metformin 1 pill in am and decrease pm dose to 1/2 pill  If diarrhea worsens please call us -we can try the XR formula to see if that helps Drink lots of fluids for kidney health-we will watch that  Eat a low sugar/low fat diet Try and get more activity (the yard is great) Take care  and follow up in 6 months unless needed sooner    I discussed the assessment and treatment plan with the patient. The patient was provided an opportunity to ask questions and all were answered. The patient agreed with the plan and demonstrated an understanding of the instructions.   The patient was advised to call back or seek an in-person evaluation if the symptoms worsen or if the condition fails to improve as anticipated.     Loura Pardon, MD

## 2018-10-13 NOTE — Assessment & Plan Note (Signed)
No changes at home  Enc good health habits  No change in tx

## 2018-10-13 NOTE — Assessment & Plan Note (Signed)
Hb is improved at 11.1  Platelets stable 96 Continue to check very 6 mo  No symptoms

## 2018-10-13 NOTE — Assessment & Plan Note (Signed)
Stable at 50 H/o MDS No symptoms Continue to watch Q 6 mo

## 2018-10-13 NOTE — Assessment & Plan Note (Addendum)
HDL down and LDL up a bit- less activity with recent stay at home guidelines Disc goals for lipids and reasons to control them Rev last labs with pt Rev low sat fat diet in detail  Continues crestor  Diet is fairly good  Expects to get outdoors and do more soon

## 2018-11-19 ENCOUNTER — Other Ambulatory Visit: Payer: Self-pay | Admitting: Family Medicine

## 2019-01-15 ENCOUNTER — Other Ambulatory Visit: Payer: Self-pay | Admitting: *Deleted

## 2019-01-15 MED ORDER — LOSARTAN POTASSIUM 25 MG PO TABS: 50.0000 mg | ORAL_TABLET | Freq: Every day | ORAL | 1 refills | Status: DC

## 2019-01-20 DIAGNOSIS — H35013 Changes in retinal vascular appearance, bilateral: Secondary | ICD-10-CM | POA: Diagnosis not present

## 2019-01-20 DIAGNOSIS — H43813 Vitreous degeneration, bilateral: Secondary | ICD-10-CM | POA: Diagnosis not present

## 2019-01-20 DIAGNOSIS — H35363 Drusen (degenerative) of macula, bilateral: Secondary | ICD-10-CM | POA: Diagnosis not present

## 2019-01-20 DIAGNOSIS — E119 Type 2 diabetes mellitus without complications: Secondary | ICD-10-CM | POA: Diagnosis not present

## 2019-01-20 LAB — HM DIABETES EYE EXAM

## 2019-01-22 ENCOUNTER — Encounter: Payer: Self-pay | Admitting: Family Medicine

## 2019-03-30 ENCOUNTER — Inpatient Hospital Stay (HOSPITAL_COMMUNITY)
Admission: EM | Admit: 2019-03-30 | Discharge: 2019-04-02 | DRG: 065 | Disposition: A | Discharge status: Rehabilitation Facility | Payer: Medicare HMO | Attending: Internal Medicine | Admitting: Internal Medicine

## 2019-03-30 ENCOUNTER — Other Ambulatory Visit: Payer: Self-pay

## 2019-03-30 ENCOUNTER — Inpatient Hospital Stay (HOSPITAL_COMMUNITY): Payer: Medicare HMO

## 2019-03-30 ENCOUNTER — Encounter (HOSPITAL_COMMUNITY): Payer: Self-pay | Admitting: Internal Medicine

## 2019-03-30 ENCOUNTER — Emergency Department (HOSPITAL_COMMUNITY): Payer: Medicare HMO

## 2019-03-30 ENCOUNTER — Telehealth: Payer: Self-pay

## 2019-03-30 DIAGNOSIS — E1122 Type 2 diabetes mellitus with diabetic chronic kidney disease: Secondary | ICD-10-CM | POA: Diagnosis present

## 2019-03-30 DIAGNOSIS — I361 Nonrheumatic tricuspid (valve) insufficiency: Secondary | ICD-10-CM | POA: Diagnosis not present

## 2019-03-30 DIAGNOSIS — E785 Hyperlipidemia, unspecified: Secondary | ICD-10-CM | POA: Diagnosis present

## 2019-03-30 DIAGNOSIS — Z6831 Body mass index (BMI) 31.0-31.9, adult: Secondary | ICD-10-CM

## 2019-03-30 DIAGNOSIS — R29707 NIHSS score 7: Secondary | ICD-10-CM | POA: Diagnosis present

## 2019-03-30 DIAGNOSIS — I340 Nonrheumatic mitral (valve) insufficiency: Secondary | ICD-10-CM | POA: Diagnosis not present

## 2019-03-30 DIAGNOSIS — E1165 Type 2 diabetes mellitus with hyperglycemia: Secondary | ICD-10-CM

## 2019-03-30 DIAGNOSIS — E7849 Other hyperlipidemia: Secondary | ICD-10-CM | POA: Diagnosis not present

## 2019-03-30 DIAGNOSIS — D6959 Other secondary thrombocytopenia: Secondary | ICD-10-CM | POA: Diagnosis present

## 2019-03-30 DIAGNOSIS — D696 Thrombocytopenia, unspecified: Secondary | ICD-10-CM | POA: Diagnosis present

## 2019-03-30 DIAGNOSIS — I63311 Cerebral infarction due to thrombosis of right middle cerebral artery: Secondary | ICD-10-CM | POA: Diagnosis not present

## 2019-03-30 DIAGNOSIS — I639 Cerebral infarction, unspecified: Principal | ICD-10-CM | POA: Diagnosis present

## 2019-03-30 DIAGNOSIS — E038 Other specified hypothyroidism: Secondary | ICD-10-CM | POA: Diagnosis not present

## 2019-03-30 DIAGNOSIS — N179 Acute kidney failure, unspecified: Secondary | ICD-10-CM | POA: Diagnosis not present

## 2019-03-30 DIAGNOSIS — D469 Myelodysplastic syndrome, unspecified: Secondary | ICD-10-CM | POA: Diagnosis present

## 2019-03-30 DIAGNOSIS — I4891 Unspecified atrial fibrillation: Secondary | ICD-10-CM | POA: Diagnosis not present

## 2019-03-30 DIAGNOSIS — Z8261 Family history of arthritis: Secondary | ICD-10-CM

## 2019-03-30 DIAGNOSIS — Z20828 Contact with and (suspected) exposure to other viral communicable diseases: Secondary | ICD-10-CM | POA: Diagnosis not present

## 2019-03-30 DIAGNOSIS — Z66 Do not resuscitate: Secondary | ICD-10-CM | POA: Diagnosis not present

## 2019-03-30 DIAGNOSIS — D539 Nutritional anemia, unspecified: Secondary | ICD-10-CM | POA: Diagnosis present

## 2019-03-30 DIAGNOSIS — R29810 Facial weakness: Secondary | ICD-10-CM | POA: Diagnosis not present

## 2019-03-30 DIAGNOSIS — G5602 Carpal tunnel syndrome, left upper limb: Secondary | ICD-10-CM | POA: Diagnosis not present

## 2019-03-30 DIAGNOSIS — I129 Hypertensive chronic kidney disease with stage 1 through stage 4 chronic kidney disease, or unspecified chronic kidney disease: Secondary | ICD-10-CM | POA: Diagnosis present

## 2019-03-30 DIAGNOSIS — Z79890 Hormone replacement therapy (postmenopausal): Secondary | ICD-10-CM

## 2019-03-30 DIAGNOSIS — R4189 Other symptoms and signs involving cognitive functions and awareness: Secondary | ICD-10-CM | POA: Diagnosis present

## 2019-03-30 DIAGNOSIS — N183 Chronic kidney disease, stage 3 unspecified: Secondary | ICD-10-CM | POA: Diagnosis not present

## 2019-03-30 DIAGNOSIS — R1310 Dysphagia, unspecified: Secondary | ICD-10-CM | POA: Diagnosis present

## 2019-03-30 DIAGNOSIS — Z96641 Presence of right artificial hip joint: Secondary | ICD-10-CM | POA: Diagnosis present

## 2019-03-30 DIAGNOSIS — E039 Hypothyroidism, unspecified: Secondary | ICD-10-CM | POA: Diagnosis present

## 2019-03-30 DIAGNOSIS — Y92009 Unspecified place in unspecified non-institutional (private) residence as the place of occurrence of the external cause: Secondary | ICD-10-CM | POA: Diagnosis not present

## 2019-03-30 DIAGNOSIS — I69354 Hemiplegia and hemiparesis following cerebral infarction affecting left non-dominant side: Secondary | ICD-10-CM | POA: Diagnosis not present

## 2019-03-30 DIAGNOSIS — Z833 Family history of diabetes mellitus: Secondary | ICD-10-CM

## 2019-03-30 DIAGNOSIS — I6381 Other cerebral infarction due to occlusion or stenosis of small artery: Secondary | ICD-10-CM

## 2019-03-30 DIAGNOSIS — Z7984 Long term (current) use of oral hypoglycemic drugs: Secondary | ICD-10-CM

## 2019-03-30 DIAGNOSIS — I4510 Unspecified right bundle-branch block: Secondary | ICD-10-CM | POA: Diagnosis present

## 2019-03-30 DIAGNOSIS — G8194 Hemiplegia, unspecified affecting left nondominant side: Secondary | ICD-10-CM | POA: Diagnosis present

## 2019-03-30 DIAGNOSIS — H3530 Unspecified macular degeneration: Secondary | ICD-10-CM | POA: Diagnosis present

## 2019-03-30 DIAGNOSIS — Z8249 Family history of ischemic heart disease and other diseases of the circulatory system: Secondary | ICD-10-CM

## 2019-03-30 DIAGNOSIS — Z79899 Other long term (current) drug therapy: Secondary | ICD-10-CM

## 2019-03-30 DIAGNOSIS — R7989 Other specified abnormal findings of blood chemistry: Secondary | ICD-10-CM | POA: Diagnosis not present

## 2019-03-30 DIAGNOSIS — I69398 Other sequelae of cerebral infarction: Secondary | ICD-10-CM | POA: Diagnosis not present

## 2019-03-30 DIAGNOSIS — R0989 Other specified symptoms and signs involving the circulatory and respiratory systems: Secondary | ICD-10-CM | POA: Diagnosis not present

## 2019-03-30 DIAGNOSIS — W19XXXA Unspecified fall, initial encounter: Secondary | ICD-10-CM

## 2019-03-30 DIAGNOSIS — R531 Weakness: Secondary | ICD-10-CM | POA: Diagnosis not present

## 2019-03-30 DIAGNOSIS — N189 Chronic kidney disease, unspecified: Secondary | ICD-10-CM | POA: Diagnosis present

## 2019-03-30 DIAGNOSIS — I69318 Other symptoms and signs involving cognitive functions following cerebral infarction: Secondary | ICD-10-CM | POA: Diagnosis not present

## 2019-03-30 DIAGNOSIS — N390 Urinary tract infection, site not specified: Secondary | ICD-10-CM | POA: Diagnosis not present

## 2019-03-30 DIAGNOSIS — R7309 Other abnormal glucose: Secondary | ICD-10-CM | POA: Diagnosis not present

## 2019-03-30 DIAGNOSIS — Z881 Allergy status to other antibiotic agents status: Secondary | ICD-10-CM

## 2019-03-30 DIAGNOSIS — Z85828 Personal history of other malignant neoplasm of skin: Secondary | ICD-10-CM

## 2019-03-30 DIAGNOSIS — Z8673 Personal history of transient ischemic attack (TIA), and cerebral infarction without residual deficits: Secondary | ICD-10-CM

## 2019-03-30 DIAGNOSIS — E669 Obesity, unspecified: Secondary | ICD-10-CM | POA: Diagnosis present

## 2019-03-30 DIAGNOSIS — E1143 Type 2 diabetes mellitus with diabetic autonomic (poly)neuropathy: Secondary | ICD-10-CM | POA: Diagnosis present

## 2019-03-30 DIAGNOSIS — R471 Dysarthria and anarthria: Secondary | ICD-10-CM | POA: Diagnosis present

## 2019-03-30 DIAGNOSIS — R001 Bradycardia, unspecified: Secondary | ICD-10-CM | POA: Diagnosis not present

## 2019-03-30 DIAGNOSIS — B9620 Unspecified Escherichia coli [E. coli] as the cause of diseases classified elsewhere: Secondary | ICD-10-CM | POA: Diagnosis not present

## 2019-03-30 DIAGNOSIS — E119 Type 2 diabetes mellitus without complications: Secondary | ICD-10-CM | POA: Diagnosis not present

## 2019-03-30 DIAGNOSIS — I10 Essential (primary) hypertension: Secondary | ICD-10-CM | POA: Diagnosis present

## 2019-03-30 DIAGNOSIS — M1990 Unspecified osteoarthritis, unspecified site: Secondary | ICD-10-CM | POA: Diagnosis present

## 2019-03-30 DIAGNOSIS — D509 Iron deficiency anemia, unspecified: Secondary | ICD-10-CM | POA: Diagnosis present

## 2019-03-30 DIAGNOSIS — Z888 Allergy status to other drugs, medicaments and biological substances status: Secondary | ICD-10-CM

## 2019-03-30 DIAGNOSIS — Z9181 History of falling: Secondary | ICD-10-CM

## 2019-03-30 DIAGNOSIS — E1142 Type 2 diabetes mellitus with diabetic polyneuropathy: Secondary | ICD-10-CM | POA: Diagnosis not present

## 2019-03-30 DIAGNOSIS — E1169 Type 2 diabetes mellitus with other specified complication: Secondary | ICD-10-CM | POA: Diagnosis present

## 2019-03-30 DIAGNOSIS — E46 Unspecified protein-calorie malnutrition: Secondary | ICD-10-CM | POA: Diagnosis present

## 2019-03-30 HISTORY — DX: Cerebral infarction, unspecified: I63.9

## 2019-03-30 HISTORY — DX: Unspecified macular degeneration: H35.30

## 2019-03-30 HISTORY — DX: Urinary tract infection, site not specified: N39.0

## 2019-03-30 LAB — COMPREHENSIVE METABOLIC PANEL
ALT: 23 U/L (ref 0–44)
AST: 46 U/L — ABNORMAL HIGH (ref 15–41)
Albumin: 2.6 g/dL — ABNORMAL LOW (ref 3.5–5.0)
Alkaline Phosphatase: 54 U/L (ref 38–126)
Anion gap: 9 (ref 5–15)
BUN: 19 mg/dL (ref 8–23)
CO2: 24 mmol/L (ref 22–32)
Calcium: 9.3 mg/dL (ref 8.9–10.3)
Chloride: 106 mmol/L (ref 98–111)
Creatinine, Ser: 1.16 mg/dL — ABNORMAL HIGH (ref 0.44–1.00)
GFR calc Af Amer: 49 mL/min — ABNORMAL LOW (ref >60)
GFR calc non Af Amer: 42 mL/min — ABNORMAL LOW (ref >60)
Glucose, Bld: 256 mg/dL — ABNORMAL HIGH (ref 70–99)
Potassium: 4.2 mmol/L (ref 3.5–5.1)
Sodium: 139 mmol/L (ref 135–145)
Total Bilirubin: 1.4 mg/dL — ABNORMAL HIGH (ref 0.3–1.2)
Total Protein: 6 g/dL — ABNORMAL LOW (ref 6.5–8.1)

## 2019-03-30 LAB — I-STAT CHEM 8, ED
BUN: 26 mg/dL — ABNORMAL HIGH (ref 8–23)
Calcium, Ion: 1.26 mmol/L (ref 1.15–1.40)
Chloride: 104 mmol/L (ref 98–111)
Creatinine, Ser: 1 mg/dL (ref 0.44–1.00)
Glucose, Bld: 243 mg/dL — ABNORMAL HIGH (ref 70–99)
HCT: 30 % — ABNORMAL LOW (ref 36.0–46.0)
Hemoglobin: 10.2 g/dL — ABNORMAL LOW (ref 12.0–15.0)
Potassium: 4.6 mmol/L (ref 3.5–5.1)
Sodium: 139 mmol/L (ref 135–145)
TCO2: 25 mmol/L (ref 22–32)

## 2019-03-30 LAB — PROTIME-INR
INR: 1.3 — ABNORMAL HIGH (ref 0.8–1.2)
Prothrombin Time: 15.7 seconds — ABNORMAL HIGH (ref 11.4–15.2)

## 2019-03-30 LAB — CBC
HCT: 32.8 % — ABNORMAL LOW (ref 36.0–46.0)
Hemoglobin: 10.4 g/dL — ABNORMAL LOW (ref 12.0–15.0)
MCH: 33.3 pg (ref 26.0–34.0)
MCHC: 31.7 g/dL (ref 30.0–36.0)
MCV: 105.1 fL — ABNORMAL HIGH (ref 80.0–100.0)
Platelets: 91 10*3/uL — ABNORMAL LOW (ref 150–400)
RBC: 3.12 MIL/uL — ABNORMAL LOW (ref 3.87–5.11)
RDW: 14.8 % (ref 11.5–15.5)
WBC: 6.6 10*3/uL (ref 4.0–10.5)
nRBC: 0 % (ref 0.0–0.2)

## 2019-03-30 LAB — VITAMIN B12: Vitamin B-12: 880 pg/mL (ref 180–914)

## 2019-03-30 LAB — DIFFERENTIAL
Abs Immature Granulocytes: 0.02 10*3/uL (ref 0.00–0.07)
Basophils Absolute: 0.1 10*3/uL (ref 0.0–0.1)
Basophils Relative: 1 %
Eosinophils Absolute: 0.2 10*3/uL (ref 0.0–0.5)
Eosinophils Relative: 3 %
Immature Granulocytes: 0 %
Lymphocytes Relative: 37 %
Lymphs Abs: 2.4 10*3/uL (ref 0.7–4.0)
Monocytes Absolute: 0.5 10*3/uL (ref 0.1–1.0)
Monocytes Relative: 8 %
Neutro Abs: 3.3 10*3/uL (ref 1.7–7.7)
Neutrophils Relative %: 51 %

## 2019-03-30 LAB — CBG MONITORING, ED
Glucose-Capillary: 234 mg/dL — ABNORMAL HIGH (ref 70–99)
Glucose-Capillary: 96 mg/dL (ref 70–99)
Glucose-Capillary: 147 mg/dL — ABNORMAL HIGH (ref 70–99)

## 2019-03-30 LAB — APTT: aPTT: 29 seconds (ref 24–36)

## 2019-03-30 LAB — ECHOCARDIOGRAM COMPLETE
Height: 62 in
Weight: 2737.23 oz

## 2019-03-30 LAB — TSH: TSH: 1.543 u[IU]/mL (ref 0.350–4.500)

## 2019-03-30 LAB — SARS CORONAVIRUS 2 (TAT 6-24 HRS): SARS Coronavirus 2: NEGATIVE

## 2019-03-30 MED ORDER — ATORVASTATIN CALCIUM 80 MG PO TABS
80.0000 mg | ORAL_TABLET | Freq: Every day | ORAL | Status: DC
  Filled 2019-03-30: qty 1

## 2019-03-30 MED ORDER — SENNOSIDES-DOCUSATE SODIUM 8.6-50 MG PO TABS: 1.0000 | ORAL_TABLET | Freq: Every evening | ORAL | Status: DC | PRN

## 2019-03-30 MED ORDER — SODIUM CHLORIDE 0.9% FLUSH
3.0000 mL | Freq: Once | INTRAVENOUS | Status: DC
Start: 2019-03-30 — End: 2019-04-02

## 2019-03-30 MED ORDER — INSULIN ASPART 100 UNIT/ML ~~LOC~~ SOLN
0.0000 [IU] | Freq: Three times a day (TID) | SUBCUTANEOUS | Status: DC
  Administered 2019-03-31: 17:00:00 5 [IU] via SUBCUTANEOUS
  Administered 2019-03-31: 3 [IU] via SUBCUTANEOUS
  Administered 2019-04-01 (×2): 5 [IU] via SUBCUTANEOUS
  Administered 2019-04-02: 3 [IU] via SUBCUTANEOUS
  Administered 2019-04-02: 17:00:00 8 [IU] via SUBCUTANEOUS
  Administered 2019-04-02: 2 [IU] via SUBCUTANEOUS

## 2019-03-30 MED ORDER — INSULIN ASPART 100 UNIT/ML ~~LOC~~ SOLN: 0.0000 [IU] | Freq: Every day | SUBCUTANEOUS | Status: DC

## 2019-03-30 MED ORDER — LORAZEPAM 2 MG/ML IJ SOLN
INTRAMUSCULAR | Status: AC
  Administered 2019-03-30: 21:00:00 1 mg
  Filled 2019-03-30: qty 1

## 2019-03-30 MED ORDER — STROKE: EARLY STAGES OF RECOVERY BOOK: Freq: Once | Status: DC

## 2019-03-30 MED ORDER — ACETAMINOPHEN 650 MG RE SUPP: 650.0000 mg | RECTAL | Status: DC | PRN

## 2019-03-30 MED ORDER — LEVOTHYROXINE SODIUM 50 MCG PO TABS
50.0000 ug | ORAL_TABLET | Freq: Every day | ORAL | Status: DC
  Administered 2019-03-31 – 2019-04-02 (×3): 50 ug via ORAL
  Filled 2019-03-30 (×3): qty 1

## 2019-03-30 MED ORDER — ENOXAPARIN SODIUM 40 MG/0.4ML ~~LOC~~ SOLN: 40.0000 mg | SUBCUTANEOUS | Status: DC

## 2019-03-30 MED ORDER — ACETAMINOPHEN 160 MG/5ML PO SOLN: 650.0000 mg | ORAL | Status: DC | PRN

## 2019-03-30 MED ORDER — ACETAMINOPHEN 325 MG PO TABS: 650.0000 mg | ORAL_TABLET | ORAL | Status: DC | PRN

## 2019-03-30 MED ORDER — ASPIRIN EC 81 MG PO TBEC
81.0000 mg | DELAYED_RELEASE_TABLET | Freq: Every day | ORAL | Status: DC
  Administered 2019-03-30 – 2019-04-02 (×4): 81 mg via ORAL
  Filled 2019-03-30 (×4): qty 1

## 2019-03-30 NOTE — ED Notes (Signed)
Pt placed on portable tele-box per charge. Nurse notified pt rhythm will not be on monitor due to room location and should be listen out for any alarms coming from room.

## 2019-03-30 NOTE — ED Notes (Signed)
Dinner Tray Ordered @ 1745.

## 2019-03-30 NOTE — Telephone Encounter (Signed)
[name] (DPR signed) said that pt had fallen x 3 this weekend; on 03/27/19 pt fell before going to bed and lay on floor all night; on 03/28/19 pts family got pt up; on 03/29/19 pt slid out of her chair on the floor;got pt up again; [name] said she fell one other time this weekend but [name] could not remember the circumstances. Over weekend; pt could not walk well and was urinating a lot.on 03/29/19 pt was not using lt hand very much; pt was lethargic last wk but drank pedialyte and acted more normal. [name] said pt is not using legs,lt arm and speech is slightly slurred. Dewayne Hatch to call 911 and take pt to ED for evaluation. [name] voiced understanding and would talk with siblings about which ED to take pt to . FYI to Dr Glori Bickers.

## 2019-03-30 NOTE — ED Provider Notes (Signed)
[facility] EMERGENCY DEPARTMENT Provider Note   CSN: [id number] Arrival date & time: 03/30/19  1136  An emergency department physician performed an initial assessment on this suspected stroke patient at 1147.   LEVEL 5 CAVEAT - DYSARTHRIA  History   Chief Complaint Chief Complaint  Patient presents with  . Code Stroke    HPI Stacey Harris is a 85 y.o. female.     HPI  85 year old female presents as a code stroke.  History is limited from the patient and mostly from EMS and later the daughter.  Patient fell a couple days ago and had to be helped up.  [place] again yesterday out of a chair.  Patient has chronic left leg weakness and uses a walker.  Daughter saw her this morning at 98 and then patient went back to bed.  At 9 or 930 went back to see patient and she was having trouble speaking and left-sided weakness, leg worse than typical and left arm which is new.  Patient seems to be having trouble speaking, sometimes slurring words and sometimes saying incorrect things.  Past Medical History:  Diagnosis Date  . Blood transfusion   . Cancer ([place])    basal cell skin CA  . Diabetes mellitus    type II  . Hyperlipidemia   . Hypertension   . Hypothyroidism   . OA (osteoarthritis)     Patient Active Problem List   Diagnosis Date Noted  . Recurrent UTI (urinary tract infection) 09/17/2018  . Fall at home 09/17/2018  . Frequent urination 04/29/2018  . Elevated serum creatinine 04/11/2018  . Dysuria 11/12/2017  . SOB (shortness of breath) on exertion 11/12/2017  . History of fall 08/27/2016  . Controlled type 2 diabetes mellitus with diabetic autonomic neuropathy ([place]) 02/24/2016  . Routine general medical examination at a health care facility 02/12/2016  . MDS (myelodysplastic syndrome) ([place]) 10/24/2015  . Normocytic anemia 02/11/2015  . GERD (gastroesophageal reflux disease) 08/06/2014  . Encounter for Medicare annual wellness exam 08/03/2013  .  Thrombocytopenia ([place]) 08/03/2013  . Insomnia 01/26/2013  . Hypothyroid 09/17/2011  . Lumbar spinal stenosis 06/21/2011    Class: Present on Admission  . BACK PAIN, LUMBAR 08/23/2010  . POSTNASAL DRIP 08/23/2010  . OSTEOARTHRITIS, HIP 03/16/2009  . BASAL CELL CARCINOMA, FACE 07/01/2007  . BASAL CELL CARCINOMA, FACE 07/01/2007  . DIABETIC PERIPHERAL NEUROPATHY 09/24/2006  . Hyperlipidemia 09/24/2006  . Essential hypertension 09/24/2006  . ROSACEA 09/24/2006  . INCONTINENCE, URGE 09/24/2006  . COLONOSCOPY AND REMOVAL OF LESION, HX OF 06/09/2003    Past Surgical History:  Procedure Laterality Date  . ANKLE FRACTURE SURGERY  10   rt  . CARPAL TUNNEL RELEASE Right 06/06/2017   Procedure: RIGHT CARPAL TUNNEL RELEASE;  Surgeon: Daryll Brod, MD;  Location: [facility];  Service: Orthopedics;  Laterality: Right;  . CATARACT EXTRACTION W/ INTRAOCULAR LENS  IMPLANT, BILATERAL Bilateral 2019  . JOINT REPLACEMENT Right 2010   rt total hip 10  . LUMBAR LAMINECTOMY/DECOMPRESSION MICRODISCECTOMY  06/22/2011   Procedure: LUMBAR LAMINECTOMY/DECOMPRESSION MICRODISCECTOMY;  Surgeon: Marybelle Killings;  Location: [facility];  Service: Orthopedics;  Laterality: N/A;  L3-4, L4-5 Decompression  . MOHS SURGERY  12/08   basal cell skin cancer lt temple  . TAH      and BSO, age 49  . TONSILLECTOMY       OB History   No obstetric history on file.      Home Medications  Prior to Admission medications   Medication Sig Start Date End Date Taking? Authorizing Provider  Blood Glucose Calibration (ACCU-CHEK SMARTVIEW CONTROL) LIQD  11/23/14   [provider]  Blood Glucose Monitoring Suppl (ACCU-CHEK AVIVA PLUS) w/Device KIT Check blood sugar once daily and as directed.Dx E11.43 01/07/17   Tower, Wynelle [name], MD  Cholecalciferol (VITAMIN D PO) Take 5,000 Units by mouth daily.    [provider]  CRANBERRY PO Take 1 tablet by mouth daily.    [provider]  diazepam (VALIUM)  5 MG tablet Take 0.5 tablets (2.5 mg total) by mouth at bedtime as needed. 01/10/17   Tower, Wynelle [name], MD  glimepiride (AMARYL) 4 MG tablet TAKE 1 TABLET EVERY DAY BEFORE BREAKFAST 04/11/18   Tower, [place] A, MD  glucose blood (ACCU-CHEK AVIVA PLUS) test strip Check blood sugar once daily and as directed.Dx E11.43 01/07/17   Tower, Wynelle [name], MD  hydrochlorothiazide (HYDRODIURIL) 25 MG tablet Take 1 tablet (25 mg total) by mouth every morning. 04/11/18   Tower, Wynelle [name], MD  levothyroxine (SYNTHROID, LEVOTHROID) 50 MCG tablet Take 1 tablet (50 mcg total) by mouth daily. 04/11/18   Tower, Wynelle [name], MD  losartan (COZAAR) 25 MG tablet Take 2 tablets (50 mg total) by mouth daily. 01/15/19   Tower, Wynelle [name], MD  metFORMIN (GLUCOPHAGE) 1000 MG tablet Take 1 tablet by mouth in the am and 1/2 tablet in the pm 10/13/18   Tower, Roque Lias A, MD  metoprolol succinate (TOPROL-XL) 100 MG 24 hr tablet Take 1 tablet (100 mg total) by mouth daily. Take with or immediately following a meal. 04/11/18   Tower, Wynelle [name], MD  Multiple Vitamin (MULTIVITAMIN) capsule Take 1 capsule by mouth daily.    [provider]  Omega-3 Fatty Acids (FISH OIL) 1200 MG CAPS Take 1,200 mg by mouth 2 (two) times daily.      [provider]  rosuvastatin (CRESTOR) 10 MG tablet Take 1 tablet (10 mg total) by mouth daily. 04/11/18   Tower, Wynelle [name], MD    Family History Family History  Problem Relation Age of Onset  . Arthritis Father        RA  . Hypertension Father   . Heart disease Father        CAD  . Diabetes Father   . Arthritis Sister        RA  . Diabetes Brother   . Cancer Brother   . Diabetes Brother   . Cancer Brother   . Diabetes Brother   . Cancer Brother   . Diabetes Sister   . Cancer Brother     Social History Social History   Tobacco Use  . Smoking status: Never Smoker  . Smokeless tobacco: Never Used  Substance Use Topics  . Alcohol use: No    Alcohol/week: 0.0 standard drinks  . Drug use: No      Allergies   Keflex [cephalexin] and Mavik [trandolapril]   Review of Systems Review of Systems  Unable to perform ROS: Other     Physical Exam Updated Vital Signs BP 120/90   Pulse (!) 46   Temp 98 F (36.7 C) (Oral)   Resp 18   Ht _0  (1.575 m)   Wt 77.6 kg   SpO2 98%   BMI 31.29 kg/m   Physical Exam Vitals signs and nursing note reviewed.  Constitutional:      General: She is not in acute distress.    Appearance: She is  well-developed. She is not ill-appearing or diaphoretic.  HENT:     Head: Normocephalic and atraumatic.     Right Ear: External ear normal.     Left Ear: External ear normal.     Nose: Nose normal.  Eyes:     General:        Right eye: No discharge.        Left eye: No discharge.  Cardiovascular:     Rate and Rhythm: Bradycardia present. Rhythm irregular.     Heart sounds: Normal heart sounds.  Pulmonary:     Effort: Pulmonary effort is normal.     Breath sounds: Normal breath sounds.  Abdominal:     Palpations: Abdomen is soft.     Tenderness: There is no abdominal tenderness.  Skin:    General: Skin is warm and dry.  Neurological:     Mental Status: She is alert.     Comments: CN 3-12 grossly intact. 5/5 strength in all 4 extremities but seems a little weaker on the left upper and lower extremities. Grossly normal sensation.   Psychiatric:        Mood and Affect: Mood is not anxious.      ED Treatments / Results  Labs (all labs ordered are listed, but only abnormal results are displayed) Labs Reviewed  PROTIME-INR - Abnormal; Notable for the following components:      Result Value   Prothrombin Time 15.7 (*)    INR 1.3 (*)    All other components within normal limits  CBC - Abnormal; Notable for the following components:   RBC 3.12 (*)    Hemoglobin 10.4 (*)    HCT 32.8 (*)    MCV 105.1 (*)    Platelets 91 (*)    All other components within normal limits  COMPREHENSIVE METABOLIC PANEL - Abnormal; Notable for the following  components:   Glucose, Bld 256 (*)    Creatinine, Ser 1.16 (*)    Total Protein 6.0 (*)    Albumin 2.6 (*)    AST 46 (*)    Total Bilirubin 1.4 (*)    GFR calc non Af Amer 42 (*)    GFR calc Af Amer 49 (*)    All other components within normal limits  I-STAT CHEM 8, ED - Abnormal; Notable for the following components:   BUN 26 (*)    Glucose, Bld 243 (*)    Hemoglobin 10.2 (*)    HCT 30.0 (*)    All other components within normal limits  CBG MONITORING, ED - Abnormal; Notable for the following components:   Glucose-Capillary 234 (*)    All other components within normal limits  SARS CORONAVIRUS 2 (TAT 6-24 HRS)  APTT  DIFFERENTIAL    EKG EKG Interpretation  Date/Time:  Monday March 30 2019 12:38:40 EDT Ventricular Rate:  76 PR Interval:    QRS Duration: 169 QT Interval:  434 QTC Calculation: 430 R Axis:   -164 Text Interpretation:  Sinus rhythm Multiple premature complexes, vent & supraven Right bundle branch block Confirmed by Sherwood Gambler [phone]) on 03/30/2019 12:45:11 PM   Radiology Ct Head Code Stroke Wo Contrast  Result Date: 03/30/2019 CLINICAL DATA:  Code stroke.  Acute onset left-sided weakness. EXAM: CT HEAD WITHOUT CONTRAST TECHNIQUE: Contiguous axial images were obtained from the base of the skull through the vertex without intravenous contrast. COMPARISON:  None. FINDINGS: Brain: An ill-defined area of hypoattenuation is present within the right lentiform nucleus. This extends superiorly to  involve the internal capsule and corona radiata. No other acute cortical right-sided infarcts are present. Moderate generalized atrophy and diffuse white matter disease is present bilaterally. Remote lacunar infarcts are present in the thalami bilaterally. White matter changes extend into the brainstem. The cerebellum is unremarkable. The ventricles are proportionate to the degree of atrophy. No significant extraaxial fluid collection is present. Vascular: Atherosclerotic  calcifications are present within the cavernous internal carotid arteries bilaterally. There is no asymmetric hyperdense vessel. Skull: Calvarium is intact. No focal lytic or blastic lesions are present. Sinuses/Orbits: The paranasal sinuses and mastoid air cells are clear. Bilateral lens replacements are noted. Globes and orbits are otherwise unremarkable. ASPECTS [facility] Stroke Program Early CT Score) - Ganglionic level infarction (caudate, lentiform nuclei, internal capsule, insula, M1-M3 cortex): 5/7 - Supraganglionic infarction (M4-M6 cortex): 3/3 Total score (0-10 with 10 being normal): 8/10 IMPRESSION: 1. Age indeterminate infarct involving the right basal ganglia, internal capsule, and corona radiata. This may be acute. 2. Moderate advanced generalized atrophy and white matter disease likely reflects the sequela of chronic microvascular ischemia. 3. Atherosclerosis 4. ASPECTS is 8/10 The above was relayed via text pager to Dr. Cheral Marker on 03/30/2019 at 11:58 AM. Electronically Signed   By: San Morelle M.D.   On: 03/30/2019 12:06    Procedures Procedures (including critical care time)  Medications Ordered in ED Medications  sodium chloride flush (NS) 0.9 % injection 3 mL (has no administration in time range)     Initial Impression / Assessment and Plan / ED Course  I have reviewed the triage vital signs and the nursing notes.  Pertinent labs & imaging results that were available during my care of the patient were reviewed by me and considered in my medical decision making (see chart for details).        Initially patient was brought in as a code stroke but it appears that she is actually out of the window based on last known normal of 730.  Overall is protecting airway and blood pressure is pretty normal.  Questionable A. fib on the monitor but it appears to be sinus with some arrhythmia on ECG.  Either way she will need admission for stroke work-up and treatment.  Hospitalist to  admit.  Final Clinical Impressions(s) / ED Diagnoses   Final diagnoses:  Basal ganglia stroke [facility])    ED Discharge Orders    None       Sherwood Gambler, MD 03/30/19 1304

## 2019-03-30 NOTE — ED Notes (Signed)
ED TO INPATIENT HANDOFF REPORT  ED Nurse Name and Phone #:   S Name/Age/Gender Stacey Harris 85 y.o. female Room/Bed: 019C/019C  Code Status   Code Status: DNR  Home/SNF/Other Home Patient oriented to: self and place Is this baseline? Yes   Triage Complete: Triage complete  Chief Complaint code stroke  Triage Note Pt arrives [facility] with stroke symptoms since last night @ 0800. Pt family stated that pt was lkw @ 1930 last night. This morning the pt was slurring her speech and has left arm and leg weakness. Pt has been decreasing her activity since Friday. Pt unable to ambulate w/ increase in falls. Denies blood thinners or hx of stroke or afib.    Allergies Allergies  Allergen Reactions  . Keflex [Cephalexin]     Did ok with lower dose, higher dose caused a rash    . Mavik [Trandolapril]     Cough     Level of Care/Admitting Diagnosis ED Disposition    ED Disposition Condition Comment   Admit  Hospital Area: [facility] [[id number]]  Level of Care: Telemetry Medical [104]  Covid Evaluation: Asymptomatic Screening Protocol (No Symptoms)  Diagnosis: Stroke [facility]) [id number]  Admitting Physician: Mckinley Jewel [phone]  Attending Physician: Mckinley Jewel [phone]  Estimated length of stay: 3 - 4 days  Certification:: I certify this patient will need inpatient services for at least 2 midnights  PT Class (Do Not Modify): Inpatient [101]  PT Acc Code (Do Not Modify): Private [1]       B Medical/Surgery History Past Medical History:  Diagnosis Date  . Blood transfusion   . Cancer ([place])    basal cell skin CA  . Diabetes mellitus    type II  . Hyperlipidemia   . Hypertension   . Hypothyroidism   . OA (osteoarthritis)    Past Surgical History:  Procedure Laterality Date  . ANKLE FRACTURE SURGERY  10   rt  . CARPAL TUNNEL RELEASE Right 06/06/2017   Procedure: RIGHT CARPAL TUNNEL RELEASE;  Surgeon: Daryll Brod, MD;  Location: [facility];  Service: Orthopedics;  Laterality: Right;  . CATARACT EXTRACTION W/ INTRAOCULAR LENS  IMPLANT, BILATERAL Bilateral 2019  . JOINT REPLACEMENT Right 2010   rt total hip 10  . LUMBAR LAMINECTOMY/DECOMPRESSION MICRODISCECTOMY  06/22/2011   Procedure: LUMBAR LAMINECTOMY/DECOMPRESSION MICRODISCECTOMY;  Surgeon: Marybelle Killings;  Location: [facility];  Service: Orthopedics;  Laterality: N/A;  L3-4, L4-5 Decompression  . MOHS SURGERY  12/08   basal cell skin cancer lt temple  . TAH      and BSO, age 48  . TONSILLECTOMY       A IV Location/Drains/Wounds Patient Lines/Drains/Airways Status   Active Line/Drains/Airways    Name:   Placement date:   Placement time:   Site:   Days:   Peripheral IV 03/30/19 Left Antecubital   03/30/19    1147    Antecubital   less than 1   Peripheral IV 03/30/19 Right Antecubital   03/30/19    1203    Antecubital   less than 1   Incision 06/22/11 Back Bilateral   06/22/11    0841     2838   Incision (Closed) 06/06/17 Wrist Right   06/06/17    1418     662          Intake/Output Last 24 hours No intake or output data in the 24 hours ending 03/30/19 1408  Labs/Imaging Results for  orders placed or performed during the hospital encounter of 03/30/19 (from the past 48 hour(s))  Protime-INR     Status: Abnormal   Collection Time: 03/30/19 11:38 AM  Result Value Ref Range   Prothrombin Time 15.7 (H) 11.4 - 15.2 seconds   INR 1.3 (H) 0.8 - 1.2    Comment: (NOTE) INR goal varies based on device and disease states. Performed at [facility], [place] [address] [place], [place] [address]   APTT     Status: None   Collection Time: 03/30/19 11:38 AM  Result Value Ref Range   aPTT 29 24 - 36 seconds    Comment: Performed at [facility] [address] [place], [place] [address]  CBC     Status: Abnormal   Collection Time: 03/30/19 11:38 AM  Result Value Ref Range   WBC 6.6 4.0 - 10.5 K/uL   RBC 3.12 (L) 3.87 - 5.11 MIL/uL   Hemoglobin 10.4  (L) 12.0 - 15.0 g/dL   HCT 32.8 (L) 36.0 - 46.0 %   MCV 105.1 (H) 80.0 - 100.0 fL   MCH 33.3 26.0 - 34.0 pg   MCHC 31.7 30.0 - 36.0 g/dL   RDW 14.8 11.5 - 15.5 %   Platelets 91 (L) 150 - 400 K/uL    Comment: REPEATED TO VERIFY PLATELET COUNT CONFIRMED BY SMEAR SPECIMEN CHECKED FOR CLOTS Immature Platelet Fraction may be clinically indicated, consider ordering this additional test [id number]    nRBC 0.0 0.0 - 0.2 %    Comment: Performed at [facility], [place] [address] [place], [place] [address]  Differential     Status: None   Collection Time: 03/30/19 11:38 AM  Result Value Ref Range   Neutrophils Relative % 51 %   Neutro Abs 3.3 1.7 - 7.7 K/uL   Lymphocytes Relative 37 %   Lymphs Abs 2.4 0.7 - 4.0 K/uL   Monocytes Relative 8 %   Monocytes Absolute 0.5 0.1 - 1.0 K/uL   Eosinophils Relative 3 %   Eosinophils Absolute 0.2 0.0 - 0.5 K/uL   Basophils Relative 1 %   Basophils Absolute 0.1 0.0 - 0.1 K/uL   Immature Granulocytes 0 %   Abs Immature Granulocytes 0.02 0.00 - 0.07 K/uL    Comment: Performed at [facility] [address] [place], [place] [address]  Comprehensive metabolic panel     Status: Abnormal   Collection Time: 03/30/19 11:38 AM  Result Value Ref Range   Sodium 139 135 - 145 mmol/L   Potassium 4.2 3.5 - 5.1 mmol/L   Chloride 106 98 - 111 mmol/L   CO2 24 22 - 32 mmol/L   Glucose, Bld 256 (H) 70 - 99 mg/dL   BUN 19 8 - 23 mg/dL   Creatinine, Ser 1.16 (H) 0.44 - 1.00 mg/dL   Calcium 9.3 8.9 - 10.3 mg/dL   Total Protein 6.0 (L) 6.5 - 8.1 g/dL   Albumin 2.6 (L) 3.5 - 5.0 g/dL   AST 46 (H) 15 - 41 U/L   ALT 23 0 - 44 U/L   Alkaline Phosphatase 54 38 - 126 U/L   Total Bilirubin 1.4 (H) 0.3 - 1.2 mg/dL   GFR calc non Af Amer 42 (L) >60 mL/min   GFR calc Af Amer 49 (L) >60 mL/min   Anion gap 9 5 - 15    Comment: Performed at [facility], [place] [address], [place],  [address]  CBG monitoring, ED     Status: Abnormal   Collection Time:  03/30/19 11:38 AM  Result Value Ref Range   Glucose-Capillary 234 (H) 70 - 99 mg/dL   Comment 1 Notify RN    Comment 2 Document in Chart   I-stat chem 8, ED     Status: Abnormal   Collection Time: 03/30/19 11:49 AM  Result Value Ref Range   Sodium 139 135 - 145 mmol/L   Potassium 4.6 3.5 - 5.1 mmol/L   Chloride 104 98 - 111 mmol/L   BUN 26 (H) 8 - 23 mg/dL   Creatinine, Ser 1.00 0.44 - 1.00 mg/dL   Glucose, Bld 243 (H) 70 - 99 mg/dL   Calcium, Ion 1.26 1.15 - 1.40 mmol/L   TCO2 25 22 - 32 mmol/L   Hemoglobin 10.2 (L) 12.0 - 15.0 g/dL   HCT 30.0 (L) 36.0 - 46.0 %   Ct Head Code Stroke Wo Contrast  Result Date: 03/30/2019 CLINICAL DATA:  Code stroke.  Acute onset left-sided weakness. EXAM: CT HEAD WITHOUT CONTRAST TECHNIQUE: Contiguous axial images were obtained from the base of the skull through the vertex without intravenous contrast. COMPARISON:  None. FINDINGS: Brain: An ill-defined area of hypoattenuation is present within the right lentiform nucleus. This extends superiorly to involve the internal capsule and corona radiata. No other acute cortical right-sided infarcts are present. Moderate generalized atrophy and diffuse white matter disease is present bilaterally. Remote lacunar infarcts are present in the thalami bilaterally. White matter changes extend into the brainstem. The cerebellum is unremarkable. The ventricles are proportionate to the degree of atrophy. No significant extraaxial fluid collection is present. Vascular: Atherosclerotic calcifications are present within the cavernous internal carotid arteries bilaterally. There is no asymmetric hyperdense vessel. Skull: Calvarium is intact. No focal lytic or blastic lesions are present. Sinuses/Orbits: The paranasal sinuses and mastoid air cells are clear. Bilateral lens replacements are noted. Globes and orbits are otherwise unremarkable. ASPECTS [facility] Stroke Program Early CT Score) - Ganglionic level infarction (caudate,  lentiform nuclei, internal capsule, insula, M1-M3 cortex): 5/7 - Supraganglionic infarction (M4-M6 cortex): 3/3 Total score (0-10 with 10 being normal): 8/10 IMPRESSION: 1. Age indeterminate infarct involving the right basal ganglia, internal capsule, and corona radiata. This may be acute. 2. Moderate advanced generalized atrophy and white matter disease likely reflects the sequela of chronic microvascular ischemia. 3. Atherosclerosis 4. ASPECTS is 8/10 The above was relayed via text pager to Dr. Cheral Marker on 03/30/2019 at 11:58 AM. Electronically Signed   By: San Morelle M.D.   On: 03/30/2019 12:06    Pending Labs Unresulted Labs (From admission, onward)    Start     Ordered   03/31/19 0500  Hemoglobin A1c  Tomorrow morning,   R     03/30/19 1338   03/31/19 0500  Lipid panel  Tomorrow morning,   R    Comments: Fasting    03/30/19 1338   03/30/19 1406  Vitamin B12  Once,   STAT     03/30/19 1405   03/30/19 1405  Folate RBC  Once,   STAT     03/30/19 1405   03/30/19 1341  TSH  Add-on,   AD     03/30/19 1340   03/30/19 1335  Urinalysis, Complete w Microscopic  Once,   STAT     03/30/19 1338   03/30/19 1254  SARS CORONAVIRUS 2 (TAT 6-24 HRS) Nasopharyngeal Nasopharyngeal Swab  (Asymptomatic/Tier 2 Patients Labs)  Once,   STAT  Question Answer Comment  Is this test for diagnosis or screening Screening   Symptomatic for COVID-19 as defined by CDC No   Hospitalized for COVID-19 No   Admitted to ICU for COVID-19 No   Previously tested for COVID-19 No   Resident in a congregate (group) care setting No   Employed in healthcare setting No   Pregnant No      03/30/19 1253          Vitals/Pain Today's Vitals   03/30/19 1315 03/30/19 1330 03/30/19 1345 03/30/19 1400  BP: (!) 176/128 (!) 179/128 (!) 160/83 (!) 160/71  Pulse:  69 93   Resp: 16 15 16 16   Temp:      TempSrc:      SpO2: 91% 98% 98% 99%  Weight:      Height:      PainSc:        Isolation Precautions No active  isolations  Medications Medications  sodium chloride flush (NS) 0.9 % injection 3 mL (has no administration in time range)   stroke: mapping our early stages of recovery book (has no administration in time range)  acetaminophen (TYLENOL) tablet 650 mg (has no administration in time range)    Or  acetaminophen (TYLENOL) solution 650 mg (has no administration in time range)    Or  acetaminophen (TYLENOL) suppository 650 mg (has no administration in time range)  senna-docusate (Senokot-S) tablet 1 tablet (has no administration in time range)  aspirin EC tablet 81 mg (has no administration in time range)  atorvastatin (LIPITOR) tablet 80 mg (has no administration in time range)  insulin aspart (novoLOG) injection 0-15 Units (has no administration in time range)  insulin aspart (novoLOG) injection 0-5 Units (has no administration in time range)  levothyroxine (SYNTHROID) tablet 50 mcg (has no administration in time range)    Mobility non-ambulatory High fall risk   Focused Assessments Cardiac Assessment Handoff:  Cardiac Rhythm: Atrial fibrillation No results found for: CKTOTAL, CKMB, CKMBINDEX, TROPONINI No results found for: DDIMER Does the Patient currently have chest pain? Yes   , Neuro Assessment Handoff:  Swallow screen pass? Yes  Cardiac Rhythm: Atrial fibrillation NIH Stroke Scale ( + Modified Stroke Scale Criteria)  Interval: Initial Level of Consciousness (1a.)   : Alert, keenly responsive LOC Questions (1b. )   +: Answers neither question correctly LOC Commands (1c. )   + : Performs both tasks correctly Best Gaze (2. )  +: Normal Visual (3. )  +: No visual loss Facial Palsy (4. )    : Normal symmetrical movements Motor Arm, Left (5a. )   +: Drift Motor Arm, Right (5b. )   +: No drift Motor Leg, Left (6a. )   +: Drift Motor Leg, Right (6b. )   +: No drift Limb Ataxia (7. ): Present in one limb Sensory (8. )   +: Normal, no sensory loss Best Language (9. )   +: No  aphasia Dysarthria (10. ): Mild-to-moderate dysarthria, patient slurs at least some words and, at worst, can be understood with some difficulty Extinction/Inattention (11.)   +: No Abnormality Modified SS Total  +: 4 Complete NIHSS TOTAL: 7 Last date known well: 03/29/19 Last time known well: 1930 Neuro Assessment: Exceptions to WDL Neuro Checks:   Initial (03/30/19 1137)  Last Documented NIHSS Modified Score: 4 (03/30/19 1337) Has TPA been given? No If patient is a Neuro Trauma and patient is going to OR before floor call report to 4N Charge nurse:  [phone] or [phone]     R Recommendations: See Admitting Provider Note  Report given to:   Additional Notes: .

## 2019-03-30 NOTE — Telephone Encounter (Signed)
I agree with that advisement-will watch for ED notes

## 2019-03-30 NOTE — H&P (Signed)
History and Physical    Stacey Harris:[id number] DOB: 1928/09/10 DOA: 03/30/2019  PCP: [name], MD  Patient coming from: Home I have personally briefly reviewed patient's old medical records in [name]  Chief Complaint: Left-sided weakness and slurred speech started this morning.  HPI: Stacey Harris is a 85 y.o. female with medical history significant of hypertension, hyperlipidemia, diabetes mellitus, chronic kidney disease stage III, myelodysplastic syndrome, chronic thrombocytopenia, macrocytic anemia, hypothyroidism, arthritis brought by EMS to emergency department for the concern of a stroke.  As per daughter who is the historian-reports that patient fell on Friday-which patient did not mentioned to her daughter, has chronic left lower extremity weakness, uses walker for ambulation.  This morning when daughter went to check on her she was having trouble speaking and left-sided weakness than normal.  She called 911 and brought patient to the emergency department for further evaluation and management.  Daughter is unsure about the loss of consciousness, head trauma on the day of fall.  She denies headache, blurry vision, lightheadedness, dizziness, chest pain, shortness of breath, palpitation, nausea, vomiting, fever, chills, cough, congestion, appetite or bowel changes.  Daughter reports frequent urinary incontinence since couple of days,  Patient lives with her daughter at home.  Denies alcohol, smoking, illicit drug use.  She is compliant with her home medication.  ED Course: CT brain without contrast was obtained which came back positive for complete right thalamic infarction.  Patient was outside the window for TPA.  Neurology evaluated the patient.  Recommended MRI/MRA and echo.  Review of Systems: As per HPI otherwise negative.    Past Medical History:  Diagnosis Date  . Blood transfusion   . Cancer ([place])    basal cell skin CA  . Diabetes mellitus    type  II  . Hyperlipidemia   . Hypertension   . Hypothyroidism   . OA (osteoarthritis)     Past Surgical History:  Procedure Laterality Date  . ANKLE FRACTURE SURGERY  10   rt  . CARPAL TUNNEL RELEASE Right 06/06/2017   Procedure: RIGHT CARPAL TUNNEL RELEASE;  Surgeon: Daryll Brod, MD;  Location: [facility];  Service: Orthopedics;  Laterality: Right;  . CATARACT EXTRACTION W/ INTRAOCULAR LENS  IMPLANT, BILATERAL Bilateral 2019  . JOINT REPLACEMENT Right 2010   rt total hip 10  . LUMBAR LAMINECTOMY/DECOMPRESSION MICRODISCECTOMY  06/22/2011   Procedure: LUMBAR LAMINECTOMY/DECOMPRESSION MICRODISCECTOMY;  Surgeon: Marybelle Killings;  Location: [facility];  Service: Orthopedics;  Laterality: N/A;  L3-4, L4-5 Decompression  . MOHS SURGERY  12/08   basal cell skin cancer lt temple  . TAH      and BSO, age 74  . TONSILLECTOMY       reports that she has never smoked. She has never used smokeless tobacco. She reports that she does not drink alcohol or use drugs.  Allergies  Allergen Reactions  . Keflex [Cephalexin]     Did ok with lower dose, higher dose caused a rash    . Mavik [Trandolapril]     Cough     Family History  Problem Relation Age of Onset  . Arthritis Father        RA  . Hypertension Father   . Heart disease Father        CAD  . Diabetes Father   . Arthritis Sister        RA  . Diabetes Brother   . Cancer Brother   . Diabetes  Brother   . Cancer Brother   . Diabetes Brother   . Cancer Brother   . Diabetes Sister   . Cancer Brother     Prior to Admission medications   Medication Sig Start Date End Date Taking? Authorizing Provider  Blood Glucose Calibration (ACCU-CHEK SMARTVIEW CONTROL) LIQD  11/23/14   [provider]  Blood Glucose Monitoring Suppl (ACCU-CHEK AVIVA PLUS) w/Device KIT Check blood sugar once daily and as directed.Dx E11.43 01/07/17   Tower, Wynelle [name], MD  Cholecalciferol (VITAMIN D PO) Take 5,000 Units by mouth daily.    [provider]  CRANBERRY PO Take 1 tablet by mouth daily.    [provider]  diazepam (VALIUM) 5 MG tablet Take 0.5 tablets (2.5 mg total) by mouth at bedtime as needed. 01/10/17   Tower, Wynelle [name], MD  glimepiride (AMARYL) 4 MG tablet TAKE 1 TABLET EVERY DAY BEFORE BREAKFAST 04/11/18   Tower, [place] A, MD  glucose blood (ACCU-CHEK AVIVA PLUS) test strip Check blood sugar once daily and as directed.Dx E11.43 01/07/17   Tower, Wynelle [name], MD  hydrochlorothiazide (HYDRODIURIL) 25 MG tablet Take 1 tablet (25 mg total) by mouth every morning. 04/11/18   Tower, Wynelle [name], MD  levothyroxine (SYNTHROID, LEVOTHROID) 50 MCG tablet Take 1 tablet (50 mcg total) by mouth daily. 04/11/18   Tower, Wynelle [name], MD  losartan (COZAAR) 25 MG tablet Take 2 tablets (50 mg total) by mouth daily. 01/15/19   Tower, Wynelle [name], MD  metFORMIN (GLUCOPHAGE) 1000 MG tablet Take 1 tablet by mouth in the am and 1/2 tablet in the pm 10/13/18   Tower, Roque Lias A, MD  metoprolol succinate (TOPROL-XL) 100 MG 24 hr tablet Take 1 tablet (100 mg total) by mouth daily. Take with or immediately following a meal. 04/11/18   Tower, Wynelle [name], MD  Multiple Vitamin (MULTIVITAMIN) capsule Take 1 capsule by mouth daily.    [provider]  Omega-3 Fatty Acids (FISH OIL) 1200 MG CAPS Take 1,200 mg by mouth 2 (two) times daily.      [provider]  rosuvastatin (CRESTOR) 10 MG tablet Take 1 tablet (10 mg total) by mouth daily. 04/11/18   [name], MD    Physical Exam: Vitals:   03/30/19 1230 03/30/19 1239 03/30/19 1239 03/30/19 1300  BP: 120/90   (!) 179/65  Pulse: (!) 45 (!) 46  (!) 38  Resp: _0 Temp:   98 F (36.7 C)   TempSrc:   Oral   SpO2: 98% 98%  100%  Weight:      Height:        Constitutional: NAD, calm, comfortable Vitals:   03/30/19 1230 03/30/19 1239 03/30/19 1239 03/30/19 1300  BP: 120/90   (!) 179/65  Pulse: (!) 45 (!) 46  (!) 38  Resp: _1 Temp:   98 F (36.7 C)   TempSrc:   Oral   SpO2: 98%  98%  100%  Weight:      Height:       Constitutional: Alert and oriented x3, has slurred speech, not in acute distress.   Eyes: PERRL, lids and conjunctivae normal ENMT: Mucous membranes are moist. Posterior pharynx clear of any exudate or lesions.Normal dentition.  Neck: normal, supple, no masses, no thyromegaly Respiratory: clear to auscultation bilaterally, no wheezing, no crackles. Normal respiratory effort. No accessory muscle use.  Cardiovascular: Regular rate and rhythm, no murmurs / rubs / gallops. No extremity edema.  2+ pedal pulses. No carotid bruits.  Abdomen: no tenderness, no masses palpated. No hepatosplenomegaly. Bowel sounds positive.  Musculoskeletal: no clubbing / cyanosis. No joint deformity upper and lower extremities. Good ROM, no contractures. Normal muscle tone.  Skin: no rashes, lesions, ulcers. No induration Neurologic: Alert and oriented x3.  Power 5 out of 5 in right upper and lower extremity.  4 out of 5 in left upper extremity and 3 out of 5 in left lower extremity.  Has slurred speech.  No facial droop noted. Psychiatric: Normal judgment and insight. Alert and oriented x 3. Normal mood.    Labs on Admission: I have personally reviewed following labs and imaging studies  CBC: Recent Labs  Lab 03/30/19 1138 03/30/19 1149  WBC 6.6  --   NEUTROABS 3.3  --   HGB 10.4* 10.2*  HCT 32.8* 30.0*  MCV 105.1*  --   PLT 91*  --    Basic Metabolic Panel: Recent Labs  Lab 03/30/19 1138 03/30/19 1149  NA 139 139  K 4.2 4.6  CL 106 104  CO2 24  --   GLUCOSE 256* 243*  BUN 19 26*  CREATININE 1.16* 1.00  CALCIUM 9.3  --    GFR: Estimated Creatinine Clearance: 38.2 mL/min (by C-G formula based on SCr of 1 mg/dL). Liver Function Tests: Recent Labs  Lab 03/30/19 1138  AST 46*  ALT 23  ALKPHOS 54  BILITOT 1.4*  PROT 6.0*  ALBUMIN 2.6*   No results for input(s): LIPASE, AMYLASE in the last 168 hours. No results for input(s): AMMONIA in the last 168  hours. Coagulation Profile: Recent Labs  Lab 03/30/19 1138  INR 1.3*   Cardiac Enzymes: No results for input(s): CKTOTAL, CKMB, CKMBINDEX, TROPONINI in the last 168 hours. BNP (last 3 results) No results for input(s): PROBNP in the last 8760 hours. HbA1C: No results for input(s): HGBA1C in the last 72 hours. CBG: Recent Labs  Lab 03/30/19 1138  GLUCAP 234*   Lipid Profile: No results for input(s): CHOL, HDL, LDLCALC, TRIG, CHOLHDL, LDLDIRECT in the last 72 hours. Thyroid Function Tests: No results for input(s): TSH, T4TOTAL, FREET4, T3FREE, THYROIDAB in the last 72 hours. Anemia Panel: No results for input(s): VITAMINB12, FOLATE, FERRITIN, TIBC, IRON, RETICCTPCT in the last 72 hours. Urine analysis:    Component Value Date/Time   COLORURINE YELLOW 06/19/2011 1328   APPEARANCEUR HAZY (A) 06/19/2011 1328   LABSPEC 1.019 06/19/2011 1328   PHURINE 6.0 06/19/2011 1328   GLUCOSEU NEGATIVE 06/19/2011 1328   HGBUR TRACE (A) 06/19/2011 1328   HGBUR trace-intact 04/29/2009 1152   BILIRUBINUR Negative 09/17/2018 1536   KETONESUR NEGATIVE 06/19/2011 1328   PROTEINUR Positive (A) 09/17/2018 1536   PROTEINUR NEGATIVE 06/19/2011 1328   UROBILINOGEN 2.0 (A) 09/17/2018 1536   UROBILINOGEN 1.0 06/19/2011 1328   NITRITE Negative 09/17/2018 1536   NITRITE NEGATIVE 06/19/2011 1328   LEUKOCYTESUR Moderate (2+) (A) 09/17/2018 1536    Radiological Exams on Admission: Ct Head Code Stroke Wo Contrast  Result Date: 03/30/2019 CLINICAL DATA:  Code stroke.  Acute onset left-sided weakness. EXAM: CT HEAD WITHOUT CONTRAST TECHNIQUE: Contiguous axial images were obtained from the base of the skull through the vertex without intravenous contrast. COMPARISON:  None. FINDINGS: Brain: An ill-defined area of hypoattenuation is present within the right lentiform nucleus. This extends superiorly to involve the internal capsule and corona radiata. No other acute cortical right-sided infarcts are present.  Moderate generalized atrophy and diffuse white matter disease is present bilaterally.  Remote lacunar infarcts are present in the thalami bilaterally. White matter changes extend into the brainstem. The cerebellum is unremarkable. The ventricles are proportionate to the degree of atrophy. No significant extraaxial fluid collection is present. Vascular: Atherosclerotic calcifications are present within the cavernous internal carotid arteries bilaterally. There is no asymmetric hyperdense vessel. Skull: Calvarium is intact. No focal lytic or blastic lesions are present. Sinuses/Orbits: The paranasal sinuses and mastoid air cells are clear. Bilateral lens replacements are noted. Globes and orbits are otherwise unremarkable. ASPECTS [facility] Stroke Program Early CT Score) - Ganglionic level infarction (caudate, lentiform nuclei, internal capsule, insula, M1-M3 cortex): 5/7 - Supraganglionic infarction (M4-M6 cortex): 3/3 Total score (0-10 with 10 being normal): 8/10 IMPRESSION: 1. Age indeterminate infarct involving the right basal ganglia, internal capsule, and corona radiata. This may be acute. 2. Moderate advanced generalized atrophy and white matter disease likely reflects the sequela of chronic microvascular ischemia. 3. Atherosclerosis 4. ASPECTS is 8/10 The above was relayed via text pager to Dr. Cheral Marker on 03/30/2019 at 11:58 AM. Electronically Signed   By: San Morelle M.D.   On: 03/30/2019 12:06    EKG: Sinus rhythm, right bundle branch block and multiple premature complexes.  Assessment/Plan Principal Problem:   Stroke [facility]) Active Problems:   Hyperlipidemia   Essential hypertension   Hypothyroid   Thrombocytopenia (HCC)   MDS (myelodysplastic syndrome) (HCC)   Controlled type 2 diabetes mellitus with diabetic autonomic neuropathy ([place])   Fall at home   CKD (chronic kidney disease)    Acute ischemic stroke:  -Patient presented with slurred speech and left-sided weakness.  Reviewed CT  brain without contrast which showed age indeterminate infarct involving the right basal ganglia, internal capsule, coronary radiator.  This may be acute.  Moderate advanced denies atrophy and white matter disease reflex sequelae of chronic microvascular ischemia. -admit forTelemetry monitoring -Stroke protocol -Allow for permissive hypertension for the first 24-48h - only treat PRN if SBP >184 mmHg or diastolic blood pressure >037. Blood pressures can be gradually normalized to SBP<140 upon discharge. -MRI brain without contrast and MRA head and neck -Maintain Euthermia. -ASA and atorvastatin started.-Echocardiogram to- rule out PFO -Lipid Panel, TSH and A1C -Frequent neuro checks -Consult Neurology-appreciate help -PT/OT eval, Speech consult  Type 2 diabetes mellitus: Check A1c -Hold p.o. home medicines.  Started on sliding scale insulin and monitor blood sugar closely.  Hypothyroidism: Check TSH -Continue Synthyroid.  Hypertension: Blood pressure is currently stable -We will hold BP home medicines to allow permissive hypertension. -Monitor blood pressure closely.  Chronic thrombocytopenia: -Likely secondary to underlying MDS.  No signs of bleeding -Monitor.  Chronic kidney disease stage IIIb: Stable -Avoid nephrotoxic medications.  Monitor kidney function.  Hyperlipidemia: Check lipid panel -Continue statin  Macrocytic anemia: -Could be secondary to underlying hypothyroidism -We will check folate and B12 level.  DVT prophylaxis: TED/SCD-no Lovenox due to thrombocytopenia. Code Status: DNR Family Communication: Daughter present at bedside.  Plan of care discussed with patient in length and he verbalized understanding and agreed with it. Disposition Plan: TBD Consults called: Neurology Dr. Tamala Julian  admission status: Inpatient  Mckinley Jewel MD [facility] Pager [phone]  If 7PM-7AM, please contact night-coverage [email or web address] Password [facility]  03/30/2019,  1:59 PM

## 2019-03-30 NOTE — Progress Notes (Signed)
  Echocardiogram 2D Echocardiogram has been performed.  Stacey Harris 03/30/2019, 3:24 PM

## 2019-03-30 NOTE — Consult Note (Addendum)
Neurology Consultation  Reason for Consult: Code Stroke  Referring Physician: Dr. Regenia Skeeter  History is obtained from: Daughter who lives with patient  HPI: AMEAH CHANDA is an 85 y.o. female with history of osteoarthritis, hypothyroidism, hypertension, hyperlipidemia, diabetes, basal cell cancer.  Patient lives with her daughter.  She went to bed at 7:30 last night.  Patient awoke at 8:00 in the morning and daughter tried to get her out of the bed.  Unfortunately patient was very weak in her legs and was unable to get up so she went back to sleep.  At approximately 9 in the morning patient was able to get up and they noticed that her left arm seemed weaker than normal.  At that point they called EMS.  Patient has a modified Rankin score of 4 - she needs assistance to walk, cannot bathe herself, cannot dress herself, cannot cook, basically needs assistance for all ADLs but is not bedridden  ED course CT of head  Chart review - she has seen Dr. Leta Baptist as an outpatient for bilateral hand numbness  LKW: 730 PM 03/29/2019 tpa given?: no, wake up Premorbid modified Rankin scale (mRS): 4 NIHSS: 4   ROS:  Unable to obtain due to confusion and extreme hard of hearing  Past Medical History:  Diagnosis Date  . Blood transfusion   . Cancer ([place])    basal cell skin CA  . Diabetes mellitus    type II  . Hyperlipidemia   . Hypertension   . Hypothyroidism   . OA (osteoarthritis)    Family History  Problem Relation Age of Onset  . Arthritis Father        RA  . Hypertension Father   . Heart disease Father        CAD  . Diabetes Father   . Arthritis Sister        RA  . Diabetes Brother   . Cancer Brother   . Diabetes Brother   . Cancer Brother   . Diabetes Brother   . Cancer Brother   . Diabetes Sister   . Cancer Brother      Social History:   reports that she has never smoked. She has never used smokeless tobacco. She reports that she does not drink alcohol or use  drugs.  Medications  Current Facility-Administered Medications:  .  sodium chloride flush (NS) 0.9 % injection 3 mL, 3 mL, Intravenous, Once, Sherwood Gambler, MD  Current Outpatient Medications:  .  Blood Glucose Calibration (ACCU-CHEK SMARTVIEW CONTROL) LIQD, , Disp: , Rfl:  .  Blood Glucose Monitoring Suppl (ACCU-CHEK AVIVA PLUS) w/Device KIT, Check blood sugar once daily and as directed.Dx E11.43, Disp: 1 kit, Rfl: 0 .  Cholecalciferol (VITAMIN D PO), Take 5,000 Units by mouth daily., Disp: , Rfl:  .  CRANBERRY PO, Take 1 tablet by mouth daily., Disp: , Rfl:  .  diazepam (VALIUM) 5 MG tablet, Take 0.5 tablets (2.5 mg total) by mouth at bedtime as needed., Disp: 30 tablet, Rfl: 0 .  glimepiride (AMARYL) 4 MG tablet, TAKE 1 TABLET EVERY DAY BEFORE BREAKFAST, Disp: 90 tablet, Rfl: 3 .  glucose blood (ACCU-CHEK AVIVA PLUS) test strip, Check blood sugar once daily and as directed.Dx E11.43, Disp: 100 each, Rfl: 3 .  hydrochlorothiazide (HYDRODIURIL) 25 MG tablet, Take 1 tablet (25 mg total) by mouth every morning., Disp: 90 tablet, Rfl: 3 .  levothyroxine (SYNTHROID, LEVOTHROID) 50 MCG tablet, Take 1 tablet (50 mcg total) by mouth daily., Disp:  90 tablet, Rfl: 3 .  losartan (COZAAR) 25 MG tablet, Take 2 tablets (50 mg total) by mouth daily., Disp: 180 tablet, Rfl: 1 .  metFORMIN (GLUCOPHAGE) 1000 MG tablet, Take 1 tablet by mouth in the am and 1/2 tablet in the pm, Disp: 1 tablet, Rfl: 0 .  metoprolol succinate (TOPROL-XL) 100 MG 24 hr tablet, Take 1 tablet (100 mg total) by mouth daily. Take with or immediately following a meal., Disp: 90 tablet, Rfl: 3 .  Multiple Vitamin (MULTIVITAMIN) capsule, Take 1 capsule by mouth daily., Disp: , Rfl:  .  Omega-3 Fatty Acids (FISH OIL) 1200 MG CAPS, Take 1,200 mg by mouth 2 (two) times daily.  , Disp: , Rfl:  .  rosuvastatin (CRESTOR) 10 MG tablet, Take 1 tablet (10 mg total) by mouth daily., Disp: 90 tablet, Rfl: 3   Exam: Current vital signs: Wt  77.6 kg   BMI 31.29 kg/m  Vital signs in last 24 hours: Weight:  [77.6 kg] 77.6 kg (09/21 1100)  Physical Exam  Constitutional: Appears well-developed and well-nourished.  Psych: Affect appropriate to situation Eyes: No scleral injection HENT: No OP obstrucion Head: Normocephalic.  Cardiovascular: Normal rate and regular rhythm.  Respiratory: Effort normal, non-labored breathing GI: Soft.  No distension. There is no tenderness.  Skin: WDI  Neuro: Mental Status: Awake and alert. Unable to state her age. Follows simple motor commands and answer simple questions. No dysarthria noted. No neglect.  Cranial Nerves: II: Blinks to threat bilaterally. PERRL.  III,IV, VI: EOMI without forced gaze deviation or nystagmus. Pupils equal, round and reactive to light V: Reacts to tactile stimulation bilaterally  VII: Facial movement is symmetric.  VIII: HOH X: Hypophonic speech XI: Head is midline XII: tongue is midline   Motor: RUE 5/5 LUE 4/5 RLE 5/5 LLE 3-4/5 Sensory: Reacts to tactile stimuli x 4 Deep Tendon Reflexes: Normoactive without asymmetry Plantars: Toes are downgoing bilaterally.  Cerebellar: No ataxia with FNF bilaterally  Gait: Unable to assess    Labs I have reviewed labs in epic and the results pertinent to this consultation are:   CBC    Component Value Date/Time   WBC 5.1 10/09/2018 0903   RBC 3.23 (L) 10/09/2018 0903   HGB 11.1 (L) 10/09/2018 0903   HGB 10.8 (L) 09/03/2016 0902   HCT 32.5 (L) 10/09/2018 0903   HCT 32.7 (L) 09/03/2016 0902   PLT 96.0 (L) 10/09/2018 0903   PLT 93 (L) 09/03/2016 0902   MCV 100.5 (H) 10/09/2018 0903   MCV 97.9 09/03/2016 0902   MCH 32.3 09/03/2016 0902   MCH 31.8 06/19/2011 1326   MCHC 34.1 10/09/2018 0903   RDW 14.7 10/09/2018 0903   RDW 15.3 (H) 09/03/2016 0902   LYMPHSABS 2.0 10/09/2018 0903   LYMPHSABS 1.9 09/03/2016 0902   MONOABS 0.5 10/09/2018 0903   MONOABS 0.5 09/03/2016 0902   EOSABS 0.2 10/09/2018 0903    EOSABS 0.2 09/03/2016 0902   BASOSABS 0.1 10/09/2018 0903   BASOSABS 0.0 09/03/2016 0902    CMP     Component Value Date/Time   NA 142 10/09/2018 0903   NA 143 09/03/2016 0902   K 3.8 10/09/2018 0903   K 4.4 09/03/2016 0902   CL 108 10/09/2018 0903   CO2 25 10/09/2018 0903   CO2 23 09/03/2016 0902   GLUCOSE 150 (H) 10/09/2018 0903   GLUCOSE 109 09/03/2016 0902   BUN 28 (H) 10/09/2018 0903   BUN 28.8 (H) 09/03/2016 0902   CREATININE  1.42 (H) 10/09/2018 0903   CREATININE 1.1 09/03/2016 0902   CALCIUM 9.3 10/09/2018 0903   CALCIUM 10.0 09/03/2016 0902   PROT 6.3 10/09/2018 0903   PROT 6.9 09/03/2016 0902   ALBUMIN 3.2 (L) 10/09/2018 0903   ALBUMIN 3.3 (L) 09/03/2016 0902   AST 32 10/09/2018 0903   AST 37 (H) 09/03/2016 0902   ALT 19 10/09/2018 0903   ALT 23 09/03/2016 0902   ALKPHOS 66 10/09/2018 0903   ALKPHOS 79 09/03/2016 0902   BILITOT 0.8 10/09/2018 0903   BILITOT 0.75 09/03/2016 0902   GFRNONAA 39 (L) 06/05/2017 0912   GFRAA 46 (L) 06/05/2017 0912    Lipid Panel     Component Value Date/Time   CHOL 127 10/09/2018 0903   TRIG 89.0 10/09/2018 0903   HDL 27.10 (L) 10/09/2018 0903   CHOLHDL 5 10/09/2018 0903   VLDL 17.8 10/09/2018 0903   LDLCALC 82 10/09/2018 0903   LDLDIRECT 202.7 08/17/2010 0936     Imaging I have reviewed the images obtained:  CT-scan of the brain- completed right thalamic infarct  MRI examination of the brain-pending  Etta Quill PA-C [facility] [phone] 03/30/2019, 11:51 AM     Assessment: This is an 85 year old female with a modified Rankin scale of 4 who presents with acute onset of left-sided weakness. The patient was last seen normal at 7:30 PM on 03/29/2019. She awoke with the left-sided weakness.   1. Outside the window for tPA and also modified Rankin scale too elevated for intervention.   2. CT shows a completed right thalamic infarct.  Recommend # MRI of the brain without contrast # MRA Head  #  Carotid ultrasound  # Transthoracic Echo  # Continue aspirin # Continue rosuvastatin  # BP goal: Modified permissive HTN protocol until 7:30 PM tonight, then goal should be normotensive with gradual reduction of SBP by 15% per day.  #  HBAIC and Lipid profile # Telemetry monitoring # Frequent neuro checks # NPO until passes stroke swallow screen # please page stroke NP  Or  PA  Or MD from 8am -4 pm  as this patient from this time will be  followed by the stroke.   You can look them up on [email or web address]  Password [email or web address]   I have seen and examined the patient. I have formulated the assessment and plan. 85 year old female presenting with subacute right thalamic stroke. Exam reveals left sided weakness. Stroke evaluation and management as above.  Electronically signed: Dr. Kerney Elbe

## 2019-03-30 NOTE — ED Triage Notes (Signed)
Pt arrives [facility] with stroke symptoms since last night @ 0800. Pt family stated that pt was lkw @ 1930 last night. This morning the pt was slurring her speech and has left arm and leg weakness. Pt has been decreasing her activity since Friday. Pt unable to ambulate w/ increase in falls. Denies blood thinners or hx of stroke or afib.

## 2019-03-31 ENCOUNTER — Encounter (HOSPITAL_COMMUNITY): Payer: Medicare HMO

## 2019-03-31 ENCOUNTER — Encounter (HOSPITAL_COMMUNITY): Payer: Self-pay

## 2019-03-31 LAB — FOLATE RBC
Folate, Hemolysate: >620 ng/mL
Folate, RBC: >2160 ng/mL (ref >498)
Hematocrit: 28.7 % — ABNORMAL LOW (ref 34.0–46.6)

## 2019-03-31 LAB — URINALYSIS, COMPLETE (UACMP) WITH MICROSCOPIC
Bilirubin Urine: NEGATIVE
Glucose, UA: NEGATIVE mg/dL
Hgb urine dipstick: NEGATIVE
Ketones, ur: NEGATIVE mg/dL
Leukocytes,Ua: NEGATIVE
Nitrite: NEGATIVE
Protein, ur: NEGATIVE mg/dL
Specific Gravity, Urine: 1.011 (ref 1.005–1.030)
pH: 7 (ref 5.0–8.0)

## 2019-03-31 LAB — HEMOGLOBIN A1C
Hgb A1c MFr Bld: 8.5 % — ABNORMAL HIGH (ref 4.8–5.6)
Mean Plasma Glucose: 197.25 mg/dL

## 2019-03-31 LAB — LIPID PANEL
Cholesterol: 92 mg/dL (ref 0–200)
HDL: 20 mg/dL — ABNORMAL LOW (ref >40)
LDL Cholesterol: 55 mg/dL (ref 0–99)
Total CHOL/HDL Ratio: 4.6 RATIO
Triglycerides: 84 mg/dL (ref <150)
VLDL: 17 mg/dL (ref 0–40)

## 2019-03-31 LAB — GLUCOSE, CAPILLARY
Glucose-Capillary: 106 mg/dL — ABNORMAL HIGH (ref 70–99)
Glucose-Capillary: 118 mg/dL — ABNORMAL HIGH (ref 70–99)
Glucose-Capillary: 188 mg/dL — ABNORMAL HIGH (ref 70–99)
Glucose-Capillary: 215 mg/dL — ABNORMAL HIGH (ref 70–99)
Glucose-Capillary: 150 mg/dL — ABNORMAL HIGH (ref 70–99)

## 2019-03-31 MED ORDER — ROSUVASTATIN CALCIUM 5 MG PO TABS
10.0000 mg | ORAL_TABLET | Freq: Every day | ORAL | Status: DC
  Administered 2019-03-31 – 2019-04-02 (×3): 10 mg via ORAL
  Filled 2019-03-31 (×3): qty 2

## 2019-03-31 MED ORDER — GLIMEPIRIDE 4 MG PO TABS
4.0000 mg | ORAL_TABLET | Freq: Every day | ORAL | Status: DC
  Administered 2019-04-01 – 2019-04-02 (×2): 4 mg via ORAL
  Filled 2019-03-31 (×2): qty 1

## 2019-03-31 MED ORDER — CLOPIDOGREL BISULFATE 75 MG PO TABS: 75.0000 mg | ORAL_TABLET | Freq: Every day | ORAL | Status: DC

## 2019-03-31 NOTE — Progress Notes (Signed)
  Speech Language Pathology Treatment: Cognitive-Linquistic(Dysarthria)  Patient Details Name: Stacey Harris MRN: [id number] DOB: September 04, 1928 Today's Date: 03/31/2019 Time: [id number] SLP Time Calculation (min) (ACUTE ONLY): 16 min  Assessment / Plan / Recommendation Clinical Impression  Pt was seen for dysarthria treatment and was cooperative during the session. She was educated regarding the nature of dysarthria, and compensatory strategies to improve speech intelligibility. Pt verbalized understanding regarding all areas of education. However, she exhibited difficulty recalling the strategies and reinforcement was needed throughout the session. She used compensatory strategies at the word level with 60% accuracy increasing to 100% accuracy with mod cues and intermittent models for overarticulation. At the phrase level she demonstrated 100% accuracy when mod cues and models were provided for overarticulation and vocal intensity. SLP will continue to follow pt.     HPI HPI: Pt is an 85 y.o. female with medical history significant of hypertension, hyperlipidemia, diabetes mellitus, chronic kidney disease stage III, myelodysplastic syndrome, chronic thrombocytopenia, macrocytic anemia, hypothyroidism, arthritis brought by EMS to emergency department secondary to left-sided weakness and slurred speech. MRI of the brain revealed 2.8 cm acute to early subacute ischemic infarct involving the right basal ganglia.       SLP Plan  Continue with current plan of care  Patient needs continued Speech Lanaguage Pathology Services    Recommendations                   Follow up Recommendations: Inpatient Rehab SLP Visit Diagnosis: Dysarthria and anarthria (R47.1) Plan: Continue with current plan of care       Shonda Mandarino I. Hardin Negus, [employer], [place] Office number [phone] Pager [place] 03/31/2019, 11:40 AM

## 2019-03-31 NOTE — Evaluation (Signed)
Clinical/Bedside Swallow Evaluation Patient Details  Name: Stacey Harris MRN: [id number] Date of Birth: 11/12/1928  Today's Date: 03/31/2019 Time: SLP Start Time (ACUTE ONLY): 1110 SLP Stop Time (ACUTE ONLY): 1120 SLP Time Calculation (min) (ACUTE ONLY): 10 min  Past Medical History:  Past Medical History:  Diagnosis Date  . Blood transfusion   . Cancer ([place])    basal cell skin CA  . Diabetes mellitus    type II  . Hyperlipidemia   . Hypertension   . Hypothyroidism   . OA (osteoarthritis)    Past Surgical History:  Past Surgical History:  Procedure Laterality Date  . ANKLE FRACTURE SURGERY  10   rt  . CARPAL TUNNEL RELEASE Right 06/06/2017   Procedure: RIGHT CARPAL TUNNEL RELEASE;  Surgeon: Daryll Brod, MD;  Location: [facility];  Service: Orthopedics;  Laterality: Right;  . CATARACT EXTRACTION W/ INTRAOCULAR LENS  IMPLANT, BILATERAL Bilateral 2019  . JOINT REPLACEMENT Right 2010   rt total hip 10  . LUMBAR LAMINECTOMY/DECOMPRESSION MICRODISCECTOMY  06/22/2011   Procedure: LUMBAR LAMINECTOMY/DECOMPRESSION MICRODISCECTOMY;  Surgeon: Marybelle Killings;  Location: [facility];  Service: Orthopedics;  Laterality: N/A;  L3-4, L4-5 Decompression  . MOHS SURGERY  12/08   basal cell skin cancer lt temple  . TAH      and BSO, age 35  . TONSILLECTOMY     HPI:  Pt is an 85 y.o. female with medical history significant of hypertension, hyperlipidemia, diabetes mellitus, chronic kidney disease stage III, myelodysplastic syndrome, chronic thrombocytopenia, macrocytic anemia, hypothyroidism, arthritis brought by EMS to emergency department secondary to left-sided weakness and slurred speech. MRI of the brain revealed 2.8 cm acute to early subacute ischemic infarct involving the right basal ganglia.    Assessment / Plan / Recommendation Clinical Impression  Pt presents with functional swallow as assessed clinically.  Pt tolerated all consistencies trialed with no clinical s/s of  aspiration and exhibited adequate oral clearance of solids.  RN and pt report choking episode this morning with eggs.  Pt's son present for evaluation and noted that pt has been having difficulty swallowing meats at baseline.  Discussed diet preferences with pt and family.    Recommend mechanical soft diet with thin liquids.  ST to follow for diet tolerance and/or determine need for instrumental, given location of infarct.  SLP Visit Diagnosis: Dysphagia, oropharyngeal phase (R13.12)    Aspiration Risk  Mild aspiration risk    Diet Recommendation Dysphagia 3 (Mech soft);Thin liquid   Liquid Administration via: Cup;Straw Medication Administration: (As tolerated;no specific precautions) Supervision: Staff to assist with self feeding Compensations: Slow rate;Small sips/bites Postural Changes: Seated upright at 90 degrees    Other  Recommendations Oral Care Recommendations: Oral care BID   Follow up Recommendations Inpatient Rehab      Frequency and Duration min 2x/week  2 weeks       Prognosis Prognosis for Safe Diet Advancement: Fair Barriers to Reach Goals: (prior level of function)      Swallow Study   General HPI: Pt is an 85 y.o. female with medical history significant of hypertension, hyperlipidemia, diabetes mellitus, chronic kidney disease stage III, myelodysplastic syndrome, chronic thrombocytopenia, macrocytic anemia, hypothyroidism, arthritis brought by EMS to emergency department secondary to left-sided weakness and slurred speech. MRI of the brain revealed 2.8 cm acute to early subacute ischemic infarct involving the right basal ganglia.  Type of Study: Bedside Swallow Evaluation Diet Prior to this Study: Regular Respiratory Status: Room air Behavior/Cognition: Cooperative;Alert;Pleasant mood  Oral Cavity Assessment: Within Functional Limits Oral Care Completed by SLP: No Oral Cavity - Dentition: Dentures, top;Missing dentition(Lower partial is broken) Vision:  Functional for self-feeding Self-Feeding Abilities: Needs assist Patient Positioning: Upright in chair Baseline Vocal Quality: Normal Volitional Cough: (Fair)    Oral/Motor/Sensory Function Overall Oral Motor/Sensory Function: Mild impairment Facial ROM: Within Functional Limits Facial Symmetry: Abnormal symmetry left Lingual ROM: Within Functional Limits Lingual Symmetry: Within Functional Limits Lingual Strength: Reduced Velum: Within Functional Limits   Ice Chips Ice chips: Not tested   Thin Liquid Thin Liquid: Within functional limits Presentation: Cup;Straw    Nectar Thick Nectar Thick Liquid: Not tested   Honey Thick Honey Thick Liquid: Not tested   Puree Puree: Within functional limits Presentation: Spoon   Solid     Solid: Within functional limits Presentation: [place], [place], [place] Office: [phone] 03/31/2019,11:43 AM

## 2019-03-31 NOTE — Progress Notes (Signed)
Inpatient Rehab Admissions:  Inpatient Rehab Consult received.  I met with patient and her daughter, [name], at the bedside for rehabilitation assessment and to discuss goals and expectations of an inpatient rehab admission.  They are hopeful for CIR admission later this week pending insurance authorization and bed availability.  I will follow.    Signed: Shann Medal, PT, DPT Admissions Coordinator [phone] 03/31/19  4:40 PM

## 2019-03-31 NOTE — Progress Notes (Signed)
PROGRESS NOTE    Stacey Harris  [id number] DOB: 1929-06-03 DOA: 03/30/2019 PCP: [name], MD    Brief Narrative:  85 year old female with history of hypertension, hyperlipidemia, type 2 diabetes on oral hypoglycemics, CKD stage III, myelodysplastic syndrome and chronic thrombocytopenia, macrocytic anemia, hypothyroidism, arthritis who was brought to the emergency department for concern of stroke.  She lives with her daughter at home, the patient reportedly fell at home but she did not tell her daughter.  Next day, daughter noticed that she was having trouble speaking and her left side was weak so she called EMS and was patient was brought to the ER. In the emergency room, she had left hemiparesis, dysarthria, CT brain with right thalamic a stroke.  Patient was outside [facility] window.  Admitted for acute stroke treatment.   Assessment & Plan:   Principal Problem:   Stroke [facility]) Active Problems:   Hyperlipidemia   Essential hypertension   Hypothyroid   Thrombocytopenia (HCC)   MDS (myelodysplastic syndrome) (HCC)   Controlled type 2 diabetes mellitus with diabetic autonomic neuropathy ([place])   Fall at home   CKD (chronic kidney disease)  Acute ischemic stroke: Clinical findings, dysarthria, left hemiparesis CT head findings, right basal ganglia, internal capsule and corona radiata acute stroke. MRI of the brain, subacute ischemic infarct right basal ganglia.  No hemorrhage. MRA of the head and neck: No large vessel occlusion.  No high-grade stenosis. 2D echocardiogram, ejection fraction 60 to 65%.  No source of embolism. Antiplatelet therapy, non-at home.  Patient gets bruising easily so she stopped taking aspirin.  Recommended aspirin 81 mg daily because of thrombocytopenia. LDL 55.  Already at goal.  Continued statin from home. Hemoglobin A1c, 8.5.  On oral hypoglycemics.  Will work on better control. Neurology, PT OT. Referred to acute inpatient rehab.  Hypertension:  Fairly stable.  Permissive hypertension today.  Uncontrolled type 2 diabetes: A1c 8.5.  On Amaryl and metformin at home.  Resume Amaryl.  Keep on sliding scale insulin.  Today blood sugars are fairly stable.  Will continue to work on increasing doses of oral hypoglycemics to achieve optimum control . Myelodysplastic syndrome and chronic thrombocytopenia: Fairly stable.  Acute kidney injury on chronic kidney disease stage III: Improved and normalized.   DVT prophylaxis: SCDs Code Status: DNR Family Communication: Patient's daughter updated on the phone Disposition Plan: Acute inpatient rehab   Consultants:   Neurology  Procedures:   None  Antimicrobials:   None   Subjective: Patient seen and examined.  Still has some dysarthria.  Nursing reported that she had episode of difficulty swallowing and near choking while eating breakfast.  Left side remains weak. Telemetry shows sinus rhythm.  Objective: Vitals:   03/30/19 2249 03/31/19 0209 03/31/19 0400 03/31/19 1002  BP: 130/80 (!) 121/55 (!) 152/53 112/71  Pulse: 60 64 (!) 50 (!) 56  Resp: 16 18 16    Temp:  97.9 F (36.6 C) 97.9 F (36.6 C) 97.9 F (36.6 C)  TempSrc:  Oral Axillary Oral  SpO2: 98% 98% 99% 98%  Weight:      Height:       No intake or output data in the 24 hours ending 03/31/19 1341 Filed Weights   03/30/19 1100 03/30/19 1205  Weight: 77.6 kg 77.6 kg    Examination:  General exam: Appears calm and comfortable, on room air. Respiratory system: Clear to auscultation. Respiratory effort normal.  No added sounds. Cardiovascular system: S1 & S2 heard, RRR. No JVD, murmurs,  rubs, gallops or clicks. No pedal edema. Gastrointestinal system: Abdomen is nondistended, soft and nontender. No organomegaly or masses felt. Normal bowel sounds heard. Central nervous system: Alert and oriented. No focal neurological deficits. Extremities: Mild dysarthria. Right upper and lower extremity normal power.   Generalized weakness present. Left upper extremity 4/5, left lower extremity 3/5. Skin: No rashes, lesions or ulcers Psychiatry: Judgement and insight appear normal. Mood & affect anxious.    Data Reviewed: I have personally reviewed following labs and imaging studies  CBC: Recent Labs  Lab 03/30/19 1138 03/30/19 1149  WBC 6.6  --   NEUTROABS 3.3  --   HGB 10.4* 10.2*  HCT 32.8* 30.0*  MCV 105.1*  --   PLT 91*  --    Basic Metabolic Panel: Recent Labs  Lab 03/30/19 1138 03/30/19 1149  NA 139 139  K 4.2 4.6  CL 106 104  CO2 24  --   GLUCOSE 256* 243*  BUN 19 26*  CREATININE 1.16* 1.00  CALCIUM 9.3  --    GFR: Estimated Creatinine Clearance: 38.2 mL/min (by C-G formula based on SCr of 1 mg/dL). Liver Function Tests: Recent Labs  Lab 03/30/19 1138  AST 46*  ALT 23  ALKPHOS 54  BILITOT 1.4*  PROT 6.0*  ALBUMIN 2.6*   No results for input(s): LIPASE, AMYLASE in the last 168 hours. No results for input(s): AMMONIA in the last 168 hours. Coagulation Profile: Recent Labs  Lab 03/30/19 1138  INR 1.3*   Cardiac Enzymes: No results for input(s): CKTOTAL, CKMB, CKMBINDEX, TROPONINI in the last 168 hours. BNP (last 3 results) No results for input(s): PROBNP in the last 8760 hours. HbA1C: Recent Labs    03/31/19 0638  HGBA1C 8.5*   CBG: Recent Labs  Lab 03/30/19 1817 03/30/19 2158 03/31/19 0642 03/31/19 0756 03/31/19 1213  GLUCAP 96 147* 106* 118* 188*   Lipid Profile: Recent Labs    03/31/19 0638  CHOL 92  HDL 20*  LDLCALC 55  TRIG 84  CHOLHDL 4.6   Thyroid Function Tests: Recent Labs    03/30/19 1628  TSH 1.543   Anemia Panel: Recent Labs    03/30/19 1628  VITAMINB12 880   Sepsis Labs: No results for input(s): PROCALCITON, LATICACIDVEN in the last 168 hours.  Recent Results (from the past 240 hour(s))  SARS CORONAVIRUS 2 (TAT 6-24 HRS) Nasopharyngeal Nasopharyngeal Swab     Status: None   Collection Time: 03/30/19  1:30 PM    Specimen: Nasopharyngeal Swab  Result Value Ref Range Status   SARS Coronavirus 2 NEGATIVE NEGATIVE Final    Comment: (NOTE) SARS-CoV-2 target nucleic acids are NOT DETECTED. The SARS-CoV-2 RNA is generally detectable in upper and lower respiratory specimens during the acute phase of infection. Negative results do not preclude SARS-CoV-2 infection, do not rule out co-infections with other pathogens, and should not be used as the sole basis for treatment or other patient management decisions. Negative results must be combined with clinical observations, patient history, and epidemiological information. The expected result is Negative. Fact Sheet for Patients: SugarRoll.be Fact Sheet for Healthcare Providers: https://www.woods-mathews.com/ This test is not yet approved or cleared by the [place] FDA and  has been authorized for detection and/or diagnosis of SARS-CoV-2 by FDA under an Emergency Use Authorization (EUA). This EUA will remain  in effect (meaning this test can be used) for the duration of the COVID-19 declaration under Section 56 4(b)(1) of the Act, 21 U.S.C. section 360bbb-3(b)(1), unless the authorization  is terminated or revoked sooner. Performed at [facility], [place] [address] [place], [place] [address]          Radiology Studies: Mr Angio Head Wo Contrast  Result Date: 03/30/2019 CLINICAL DATA:  Follow-up examination for acute stroke. EXAM: MRI HEAD WITHOUT CONTRAST MRA HEAD WITHOUT CONTRAST MRA NECK WITHOUT CONTRAST TECHNIQUE: Multiplanar, multiecho pulse sequences of the brain and surrounding structures were obtained without intravenous contrast. Angiographic images of the Circle of Willis were obtained using MRA technique without intravenous contrast. Angiographic images of the neck were obtained using MRA technique without intravenous contrast. Carotid stenosis measurements (when applicable) are obtained  utilizing NASCET criteria, using the distal internal carotid diameter as the denominator. COMPARISON:  Prior CT from earlier the same day. FINDINGS: MRI HEAD FINDINGS Brain: Examination moderately degraded by motion artifact as well as the patient's inability to tolerate the full length of the exam. Age-related cerebral atrophy with mild chronic small vessel ischemic disease. Approximate 2.8 cm focus of restricted diffusion seen extending from the right lentiform nucleus into the right internal capsule and right caudate, consistent with acute to early subacute ischemic infarct (series 7, image 50). This corresponds with hyperdensity seen on prior CT. No associated hemorrhage or mass effect. No other evidence for acute or subacute ischemia. Gray-white matter differentiation otherwise maintained. No encephalomalacia to suggest chronic cortical infarction. No foci of susceptibility artifact to suggest acute or chronic intracranial hemorrhage. No mass lesion, midline shift or mass effect. No hydrocephalus. No extra-axial fluid collection. Vascular: Major intracranial vascular flow voids are grossly maintained at the skull base. Skull and upper cervical spine: Grossly unremarkable on this limited exam. Sinuses/Orbits: Patient status post bilateral ocular lens replacement. Paranasal sinuses are largely clear. Trace left mastoid effusion, of doubtful significance. Other: None. MRA HEAD FINDINGS ANTERIOR CIRCULATION: Examination severely degraded by motion artifact. Distal cervical segments of the internal carotid arteries are grossly patent bilaterally. Petrous, cervical, and cavernous ICAs patent to the termini without obvious high-grade stenosis, although evaluation fairly limited. Partially visualized A1 segments grossly patent. Anterior communicating artery not well assessed due to motion. Visualized anterior cerebral arteries patent to their distal aspects without obvious stenosis. M1 segments patent bilaterally  without obvious stenosis. Evaluation of the MCA bifurcations limited by motion. Visualized distal MCA branches grossly perfused and symmetric, although poorly assessed on this limited exam. POSTERIOR CIRCULATION: Partially visualized distal V4 segments grossly patent and symmetric bilaterally. Posterior inferior cerebral arteries not seen. Visualized basilar patent to its distal aspect without obvious stenosis. Superior cerebral arteries grossly patent at their origins. Both of the PCAs appear to be primarily supplied via the basilar. PCAs grossly patent to their distal P2/P3 segments, not well assessed distally due to motion. No obvious intracranial aneurysm. MRA NECK FINDINGS Examination severely degraded by motion artifact and lack of IV contrast. Partially visualized common carotid arteries grossly patent and symmetric bilaterally. Carotid bifurcations poorly assessed and not well seen due to extensive motion artifact. Partially visualized internal carotid arteries patent with antegrade flow. No appreciable stenosis or vascular occlusion. Origin of the vertebral arteries not assessed on this exam. Partially visualized vertebral arteries grossly patent with antegrade flow within the neck. No obvious stenosis or vascular occlusion. IMPRESSION: MRI HEAD IMPRESSION: 1. Technically limited exam due to extensive motion artifact and patient's inability to tolerate the full length of the study. 2. Approximate 2.8 cm acute to early subacute ischemic infarct involving the right basal ganglia as above. No associated hemorrhage or mass effect. 3.  Underlying age-related cerebral atrophy with mild chronic small vessel ischemic disease. MRA HEAD IMPRESSION: 1. Severely limited exam due to extensive motion artifact. 2. Gross patency of the major arterial intracranial vasculature. No obvious vascular occlusion or focal high-grade stenosis. MRA NECK IMPRESSION: 1. Severely limited exam due to extensive motion artifact. 2. Gross  patency of the major arterial vasculature within the neck. No vascular occlusion or obvious flow-limiting stenosis. Electronically Signed   By: Jeannine Boga M.D.   On: 03/30/2019 21:32   Mr Angio Neck Wo Contrast  Result Date: 03/30/2019 CLINICAL DATA:  Follow-up examination for acute stroke. EXAM: MRI HEAD WITHOUT CONTRAST MRA HEAD WITHOUT CONTRAST MRA NECK WITHOUT CONTRAST TECHNIQUE: Multiplanar, multiecho pulse sequences of the brain and surrounding structures were obtained without intravenous contrast. Angiographic images of the Circle of Willis were obtained using MRA technique without intravenous contrast. Angiographic images of the neck were obtained using MRA technique without intravenous contrast. Carotid stenosis measurements (when applicable) are obtained utilizing NASCET criteria, using the distal internal carotid diameter as the denominator. COMPARISON:  Prior CT from earlier the same day. FINDINGS: MRI HEAD FINDINGS Brain: Examination moderately degraded by motion artifact as well as the patient's inability to tolerate the full length of the exam. Age-related cerebral atrophy with mild chronic small vessel ischemic disease. Approximate 2.8 cm focus of restricted diffusion seen extending from the right lentiform nucleus into the right internal capsule and right caudate, consistent with acute to early subacute ischemic infarct (series 7, image 50). This corresponds with hyperdensity seen on prior CT. No associated hemorrhage or mass effect. No other evidence for acute or subacute ischemia. Gray-white matter differentiation otherwise maintained. No encephalomalacia to suggest chronic cortical infarction. No foci of susceptibility artifact to suggest acute or chronic intracranial hemorrhage. No mass lesion, midline shift or mass effect. No hydrocephalus. No extra-axial fluid collection. Vascular: Major intracranial vascular flow voids are grossly maintained at the skull base. Skull and upper  cervical spine: Grossly unremarkable on this limited exam. Sinuses/Orbits: Patient status post bilateral ocular lens replacement. Paranasal sinuses are largely clear. Trace left mastoid effusion, of doubtful significance. Other: None. MRA HEAD FINDINGS ANTERIOR CIRCULATION: Examination severely degraded by motion artifact. Distal cervical segments of the internal carotid arteries are grossly patent bilaterally. Petrous, cervical, and cavernous ICAs patent to the termini without obvious high-grade stenosis, although evaluation fairly limited. Partially visualized A1 segments grossly patent. Anterior communicating artery not well assessed due to motion. Visualized anterior cerebral arteries patent to their distal aspects without obvious stenosis. M1 segments patent bilaterally without obvious stenosis. Evaluation of the MCA bifurcations limited by motion. Visualized distal MCA branches grossly perfused and symmetric, although poorly assessed on this limited exam. POSTERIOR CIRCULATION: Partially visualized distal V4 segments grossly patent and symmetric bilaterally. Posterior inferior cerebral arteries not seen. Visualized basilar patent to its distal aspect without obvious stenosis. Superior cerebral arteries grossly patent at their origins. Both of the PCAs appear to be primarily supplied via the basilar. PCAs grossly patent to their distal P2/P3 segments, not well assessed distally due to motion. No obvious intracranial aneurysm. MRA NECK FINDINGS Examination severely degraded by motion artifact and lack of IV contrast. Partially visualized common carotid arteries grossly patent and symmetric bilaterally. Carotid bifurcations poorly assessed and not well seen due to extensive motion artifact. Partially visualized internal carotid arteries patent with antegrade flow. No appreciable stenosis or vascular occlusion. Origin of the vertebral arteries not assessed on this exam. Partially visualized vertebral arteries  grossly patent with antegrade  flow within the neck. No obvious stenosis or vascular occlusion. IMPRESSION: MRI HEAD IMPRESSION: 1. Technically limited exam due to extensive motion artifact and patient's inability to tolerate the full length of the study. 2. Approximate 2.8 cm acute to early subacute ischemic infarct involving the right basal ganglia as above. No associated hemorrhage or mass effect. 3. Underlying age-related cerebral atrophy with mild chronic small vessel ischemic disease. MRA HEAD IMPRESSION: 1. Severely limited exam due to extensive motion artifact. 2. Gross patency of the major arterial intracranial vasculature. No obvious vascular occlusion or focal high-grade stenosis. MRA NECK IMPRESSION: 1. Severely limited exam due to extensive motion artifact. 2. Gross patency of the major arterial vasculature within the neck. No vascular occlusion or obvious flow-limiting stenosis. Electronically Signed   By: Jeannine Boga M.D.   On: 03/30/2019 21:32   Mr Brain Wo Contrast  Result Date: 03/30/2019 CLINICAL DATA:  Follow-up examination for acute stroke. EXAM: MRI HEAD WITHOUT CONTRAST MRA HEAD WITHOUT CONTRAST MRA NECK WITHOUT CONTRAST TECHNIQUE: Multiplanar, multiecho pulse sequences of the brain and surrounding structures were obtained without intravenous contrast. Angiographic images of the Circle of Willis were obtained using MRA technique without intravenous contrast. Angiographic images of the neck were obtained using MRA technique without intravenous contrast. Carotid stenosis measurements (when applicable) are obtained utilizing NASCET criteria, using the distal internal carotid diameter as the denominator. COMPARISON:  Prior CT from earlier the same day. FINDINGS: MRI HEAD FINDINGS Brain: Examination moderately degraded by motion artifact as well as the patient's inability to tolerate the full length of the exam. Age-related cerebral atrophy with mild chronic small vessel ischemic  disease. Approximate 2.8 cm focus of restricted diffusion seen extending from the right lentiform nucleus into the right internal capsule and right caudate, consistent with acute to early subacute ischemic infarct (series 7, image 50). This corresponds with hyperdensity seen on prior CT. No associated hemorrhage or mass effect. No other evidence for acute or subacute ischemia. Gray-white matter differentiation otherwise maintained. No encephalomalacia to suggest chronic cortical infarction. No foci of susceptibility artifact to suggest acute or chronic intracranial hemorrhage. No mass lesion, midline shift or mass effect. No hydrocephalus. No extra-axial fluid collection. Vascular: Major intracranial vascular flow voids are grossly maintained at the skull base. Skull and upper cervical spine: Grossly unremarkable on this limited exam. Sinuses/Orbits: Patient status post bilateral ocular lens replacement. Paranasal sinuses are largely clear. Trace left mastoid effusion, of doubtful significance. Other: None. MRA HEAD FINDINGS ANTERIOR CIRCULATION: Examination severely degraded by motion artifact. Distal cervical segments of the internal carotid arteries are grossly patent bilaterally. Petrous, cervical, and cavernous ICAs patent to the termini without obvious high-grade stenosis, although evaluation fairly limited. Partially visualized A1 segments grossly patent. Anterior communicating artery not well assessed due to motion. Visualized anterior cerebral arteries patent to their distal aspects without obvious stenosis. M1 segments patent bilaterally without obvious stenosis. Evaluation of the MCA bifurcations limited by motion. Visualized distal MCA branches grossly perfused and symmetric, although poorly assessed on this limited exam. POSTERIOR CIRCULATION: Partially visualized distal V4 segments grossly patent and symmetric bilaterally. Posterior inferior cerebral arteries not seen. Visualized basilar patent to its  distal aspect without obvious stenosis. Superior cerebral arteries grossly patent at their origins. Both of the PCAs appear to be primarily supplied via the basilar. PCAs grossly patent to their distal P2/P3 segments, not well assessed distally due to motion. No obvious intracranial aneurysm. MRA NECK FINDINGS Examination severely degraded by motion artifact and lack of IV contrast.  Partially visualized common carotid arteries grossly patent and symmetric bilaterally. Carotid bifurcations poorly assessed and not well seen due to extensive motion artifact. Partially visualized internal carotid arteries patent with antegrade flow. No appreciable stenosis or vascular occlusion. Origin of the vertebral arteries not assessed on this exam. Partially visualized vertebral arteries grossly patent with antegrade flow within the neck. No obvious stenosis or vascular occlusion. IMPRESSION: MRI HEAD IMPRESSION: 1. Technically limited exam due to extensive motion artifact and patient's inability to tolerate the full length of the study. 2. Approximate 2.8 cm acute to early subacute ischemic infarct involving the right basal ganglia as above. No associated hemorrhage or mass effect. 3. Underlying age-related cerebral atrophy with mild chronic small vessel ischemic disease. MRA HEAD IMPRESSION: 1. Severely limited exam due to extensive motion artifact. 2. Gross patency of the major arterial intracranial vasculature. No obvious vascular occlusion or focal high-grade stenosis. MRA NECK IMPRESSION: 1. Severely limited exam due to extensive motion artifact. 2. Gross patency of the major arterial vasculature within the neck. No vascular occlusion or obvious flow-limiting stenosis. Electronically Signed   By: Jeannine Boga M.D.   On: 03/30/2019 21:32   Ct Head Code Stroke Wo Contrast  Result Date: 03/30/2019 CLINICAL DATA:  Code stroke.  Acute onset left-sided weakness. EXAM: CT HEAD WITHOUT CONTRAST TECHNIQUE: Contiguous  axial images were obtained from the base of the skull through the vertex without intravenous contrast. COMPARISON:  None. FINDINGS: Brain: An ill-defined area of hypoattenuation is present within the right lentiform nucleus. This extends superiorly to involve the internal capsule and corona radiata. No other acute cortical right-sided infarcts are present. Moderate generalized atrophy and diffuse white matter disease is present bilaterally. Remote lacunar infarcts are present in the thalami bilaterally. White matter changes extend into the brainstem. The cerebellum is unremarkable. The ventricles are proportionate to the degree of atrophy. No significant extraaxial fluid collection is present. Vascular: Atherosclerotic calcifications are present within the cavernous internal carotid arteries bilaterally. There is no asymmetric hyperdense vessel. Skull: Calvarium is intact. No focal lytic or blastic lesions are present. Sinuses/Orbits: The paranasal sinuses and mastoid air cells are clear. Bilateral lens replacements are noted. Globes and orbits are otherwise unremarkable. ASPECTS [facility] Stroke Program Early CT Score) - Ganglionic level infarction (caudate, lentiform nuclei, internal capsule, insula, M1-M3 cortex): 5/7 - Supraganglionic infarction (M4-M6 cortex): 3/3 Total score (0-10 with 10 being normal): 8/10 IMPRESSION: 1. Age indeterminate infarct involving the right basal ganglia, internal capsule, and corona radiata. This may be acute. 2. Moderate advanced generalized atrophy and white matter disease likely reflects the sequela of chronic microvascular ischemia. 3. Atherosclerosis 4. ASPECTS is 8/10 The above was relayed via text pager to Dr. Cheral Marker on 03/30/2019 at 11:58 AM. Electronically Signed   By: San Morelle M.D.   On: 03/30/2019 12:06        Scheduled Meds:   stroke: mapping our early stages of recovery book   Does not apply Once   aspirin EC  81 mg Oral Daily   atorvastatin  80  mg Oral q1800   [START ON 04/01/2019] glimepiride  4 mg Oral Q breakfast   insulin aspart  0-15 Units Subcutaneous TID WC   insulin aspart  0-5 Units Subcutaneous QHS   levothyroxine  50 mcg Oral Daily   sodium chloride flush  3 mL Intravenous Once   Continuous Infusions:   LOS: 1 day    Time spent: 35 minutes    Barb Merino, MD [facility] Pager  [phone]  If 7PM-7AM, please contact night-coverage [email or web address] Password [facility] 03/31/2019, 1:41 PM

## 2019-03-31 NOTE — Evaluation (Signed)
Speech Language Pathology Evaluation Patient Details Name: Stacey Harris MRN: [id number] DOB: 1928/09/16 Today's Date: 03/31/2019 Time: [id number] SLP Time Calculation (min) (ACUTE ONLY): 20 min  Problem List:  Patient Active Problem List   Diagnosis Date Noted  . Stroke ([place]) 03/30/2019  . CKD (chronic kidney disease) 03/30/2019  . Recurrent UTI (urinary tract infection) 09/17/2018  . Fall at home 09/17/2018  . Frequent urination 04/29/2018  . Elevated serum creatinine 04/11/2018  . Dysuria 11/12/2017  . SOB (shortness of breath) on exertion 11/12/2017  . History of fall 08/27/2016  . Controlled type 2 diabetes mellitus with diabetic autonomic neuropathy ([place]) 02/24/2016  . Routine general medical examination at a health care facility 02/12/2016  . MDS (myelodysplastic syndrome) ([place]) 10/24/2015  . Normocytic anemia 02/11/2015  . GERD (gastroesophageal reflux disease) 08/06/2014  . Encounter for Medicare annual wellness exam 08/03/2013  . Thrombocytopenia ([place]) 08/03/2013  . Insomnia 01/26/2013  . Hypothyroid 09/17/2011  . Lumbar spinal stenosis 06/21/2011    Class: Present on Admission  . BACK PAIN, LUMBAR 08/23/2010  . POSTNASAL DRIP 08/23/2010  . OSTEOARTHRITIS, HIP 03/16/2009  . BASAL CELL CARCINOMA, FACE 07/01/2007  . BASAL CELL CARCINOMA, FACE 07/01/2007  . DIABETIC PERIPHERAL NEUROPATHY 09/24/2006  . Hyperlipidemia 09/24/2006  . Essential hypertension 09/24/2006  . ROSACEA 09/24/2006  . INCONTINENCE, URGE 09/24/2006  . COLONOSCOPY AND REMOVAL OF LESION, HX OF 06/09/2003   Past Medical History:  Past Medical History:  Diagnosis Date  . Blood transfusion   . Cancer ([place])    basal cell skin CA  . Diabetes mellitus    type II  . Hyperlipidemia   . Hypertension   . Hypothyroidism   . OA (osteoarthritis)    Past Surgical History:  Past Surgical History:  Procedure Laterality Date  . ANKLE FRACTURE SURGERY  10   rt  . CARPAL TUNNEL RELEASE Right 06/06/2017    Procedure: RIGHT CARPAL TUNNEL RELEASE;  Surgeon: Daryll Brod, MD;  Location: [facility];  Service: Orthopedics;  Laterality: Right;  . CATARACT EXTRACTION W/ INTRAOCULAR LENS  IMPLANT, BILATERAL Bilateral 2019  . JOINT REPLACEMENT Right 2010   rt total hip 10  . LUMBAR LAMINECTOMY/DECOMPRESSION MICRODISCECTOMY  06/22/2011   Procedure: LUMBAR LAMINECTOMY/DECOMPRESSION MICRODISCECTOMY;  Surgeon: Marybelle Killings;  Location: [facility];  Service: Orthopedics;  Laterality: N/A;  L3-4, L4-5 Decompression  . MOHS SURGERY  12/08   basal cell skin cancer lt temple  . TAH      and BSO, age 68  . TONSILLECTOMY     HPI:  Pt is an 85 y.o. female with medical history significant of hypertension, hyperlipidemia, diabetes mellitus, chronic kidney disease stage III, myelodysplastic syndrome, chronic thrombocytopenia, macrocytic anemia, hypothyroidism, arthritis brought by EMS to emergency department secondary to left-sided weakness and slurred speech. MRI of the brain revealed 2.8 cm acute to early subacute ischemic infarct involving the right basal ganglia.    Assessment / Plan / Recommendation Clinical Impression  Pt reported that she was living with her daughter prior to admission. She denied any baseline deficits in speech, language or cognition. With the pt's permission, pt's daughter, [name], was contacted via phone. Pt's daughter reported that the pt is typically not left alone for more than two hours. She stated that "some days are better than others" but that there was a notable decline in cognition for some time. She indicated that the pt has baseline difficulty with word retrieval, memory, telling the time, and problem solving. Per the daughter,  she suspects that the pt would not know what to do in an emergency situation.   Today's evaluation revealed cognitive-linguistic deficits related to memory, reasoning, attention, and problem solving which negatively impacted her accuracy with auditory  comprehension tasks. However, her language skills were within functional limits. She exhibited moderate dysarthria characterized by reduced articulatory precision and a reduced vocal intensity which together reduced speech intelligibility. Skilled SLP services are clinically indicated at this time to improve dysarthria but her cognitive-linguistic and language skills appear to be at baseline. Pt, nursing, and her daughter were educated regarding results and recommendations. All parties verbalized understanding as well as agreement with plan of care.     SLP Assessment  SLP Recommendation/Assessment: Patient needs continued Speech Lanaguage Pathology Services SLP Visit Diagnosis: Dysarthria and anarthria (R47.1)    Follow Up Recommendations  Inpatient Rehab    Frequency and Duration min 2x/week  2 weeks      SLP Evaluation Cognition  Overall Cognitive Status: History of cognitive impairments - at baseline Arousal/Alertness: Awake/alert Orientation Level: Oriented to person;Oriented to place;Oriented to situation;Disoriented to time(Oriented to month; Date: 20th; year: 1992, day: Monday) Attention: Focused;Sustained Focused Attention: Impaired Focused Attention Impairment: Verbal complex Sustained Attention: Impaired Sustained Attention Impairment: Verbal complex Memory: Impaired Memory Impairment: Retrieval deficit;Decreased recall of new information;Storage deficit(Immediate: 2/3; delayed: 0/3) Awareness: Impaired Awareness Impairment: Intellectual impairment Problem Solving: Impaired Problem Solving Impairment: Verbal complex       Comprehension  Auditory Comprehension Overall Auditory Comprehension: Appears within functional limits for tasks assessed Yes/No Questions: Within Functional Limits Commands: Impaired Two Step Basic Commands: (3/4) Multistep Basic Commands: (0/3) Conversation: Simple Interfering Components: Attention;Working memory;Processing speed;Hearing Visual  Recognition/Discrimination Discrimination: Within Function Limits Reading Comprehension Reading Status: Within funtional limits    Expression Expression Primary Mode of Expression: Verbal Verbal Expression Overall Verbal Expression: Appears within functional limits for tasks assessed Initiation: No impairment Automatic Speech: Day of week;Month of year(Days: 7/7; months: 7/12) Level of Generative/Spontaneous Verbalization: Sentence Repetition: No impairment(4/4) Naming: No impairment Pragmatics: No impairment Interfering Components: Attention;Speech intelligibility   Oral / Motor  Oral Motor/Sensory Function Overall Oral Motor/Sensory Function: Mild impairment Motor Speech Overall Motor Speech: Impaired Respiration: Within functional limits Phonation: Low vocal intensity Resonance: Within functional limits Articulation: Impaired Level of Impairment: Sentence Intelligibility: Intelligibility reduced Word: 50-74% accurate Phrase: 50-74% accurate Sentence: 25-49% accurate Conversation: 25-49% accurate Motor Planning: Witnin functional limits Motor Speech Errors: Aware;Consistent   Steaven Wholey I. Hardin Negus, [place], [place] Office number [phone] Pager [place] 03/31/2019, 11:34 AM

## 2019-03-31 NOTE — Progress Notes (Signed)
Occupational Therapy Note  OT eval completed, full note to follow.  Recommend CIR.    Lucille Passy, OTR/L Runner, broadcasting/film/video [phone] Office [phone]

## 2019-03-31 NOTE — Plan of Care (Signed)
Patient arrived on unit oriented to self but speaking  more clearly. Left side weak  Problem: Education: Goal: Knowledge of General Education information will improve Description: Including pain rating scale, medication(s)/side effects and non-pharmacologic comfort measures Outcome: Progressing   Problem: Health Behavior/Discharge Planning: Goal: Ability to manage health-related needs will improve Outcome: Progressing   Problem: Clinical Measurements: Goal: Ability to maintain clinical measurements within normal limits will improve Outcome: Progressing Goal: Will remain free from infection Outcome: Progressing Goal: Diagnostic test results will improve Outcome: Progressing Goal: Respiratory complications will improve Outcome: Progressing Goal: Cardiovascular complication will be avoided Outcome: Progressing   Problem: Activity: Goal: Risk for activity intolerance will decrease Outcome: Progressing   Problem: Nutrition: Goal: Adequate nutrition will be maintained Outcome: Progressing   Problem: Coping: Goal: Level of anxiety will decrease Outcome: Progressing   Problem: Elimination: Goal: Will not experience complications related to bowel motility Outcome: Progressing Goal: Will not experience complications related to urinary retention Outcome: Progressing   Problem: Pain Managment: Goal: General experience of comfort will improve Outcome: Progressing   Problem: Safety: Goal: Ability to remain free from injury will improve Outcome: Progressing   Problem: Skin Integrity: Goal: Risk for impaired skin integrity will decrease Outcome: Progressing   Problem: Education: Goal: Knowledge of disease or condition will improve Outcome: Progressing Goal: Knowledge of secondary prevention will improve Outcome: Progressing Goal: Knowledge of patient specific risk factors addressed and post discharge goals established will improve Outcome: Progressing   Problem:  Coping: Goal: Will verbalize positive feelings about self Outcome: Progressing Goal: Will identify appropriate support needs Outcome: Progressing   Problem: Health Behavior/Discharge Planning: Goal: Ability to manage health-related needs will improve Outcome: Progressing   Problem: Self-Care: Goal: Ability to participate in self-care as condition permits will improve Outcome: Progressing Goal: Verbalization of feelings and concerns over difficulty with self-care will improve Outcome: Progressing Goal: Ability to communicate needs accurately will improve Outcome: Progressing   Problem: Nutrition: Goal: Risk of aspiration will decrease Outcome: Progressing Goal: Dietary intake will improve Outcome: Progressing   Problem: Ischemic Stroke/TIA Tissue Perfusion: Goal: Complications of ischemic stroke/TIA will be minimized Outcome: Progressing
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
## 2019-03-31 NOTE — Progress Notes (Signed)
STROKE TEAM PROGRESS NOTE   INTERVAL HISTORY Patient daughter at bedside.  Patient sitting in chair, lethargic but orientated to place and people.  Still has mild left-sided weakness.  MRI showed right BG/CR infarct.  Likely small vessel disease due to uncontrolled diabetes.  PT/OT recommend CIR.  Vitals:   03/30/19 2249 03/31/19 0209 03/31/19 0400 03/31/19 1002  BP: 130/80 (!) 121/55 (!) 152/53 112/71  Pulse: 60 64 (!) 50 (!) 56  Resp: 16 18 16    Temp:  97.9 F (36.6 C) 97.9 F (36.6 C) 97.9 F (36.6 C)  TempSrc:  Oral Axillary Oral  SpO2: 98% 98% 99% 98%  Weight:      Height:        CBC:  Recent Labs  Lab 03/30/19 1138 03/30/19 1149  WBC 6.6  --   NEUTROABS 3.3  --   HGB 10.4* 10.2*  HCT 32.8* 30.0*  MCV 105.1*  --   PLT 91*  --     Basic Metabolic Panel:  Recent Labs  Lab 03/30/19 1138 03/30/19 1149  NA 139 139  K 4.2 4.6  CL 106 104  CO2 24  --   GLUCOSE 256* 243*  BUN 19 26*  CREATININE 1.16* 1.00  CALCIUM 9.3  --    Lipid Panel:     Component Value Date/Time   CHOL 92 03/31/2019 0638   TRIG 84 03/31/2019 0638   HDL 20 (L) 03/31/2019 0638   CHOLHDL 4.6 03/31/2019 0638   VLDL 17 03/31/2019 0638   LDLCALC 55 03/31/2019 0638   HgbA1c:  Lab Results  Component Value Date   HGBA1C 8.5 (H) 03/31/2019   Urine Drug Screen: No results found for: LABOPIA, COCAINSCRNUR, LABBENZ, AMPHETMU, THCU, LABBARB  Alcohol Level No results found for: [facility]  IMAGING Mr Angio Head Wo Contrast  Result Date: 03/30/2019 CLINICAL DATA:  Follow-up examination for acute stroke. EXAM: MRI HEAD WITHOUT CONTRAST MRA HEAD WITHOUT CONTRAST MRA NECK WITHOUT CONTRAST TECHNIQUE: Multiplanar, multiecho pulse sequences of the brain and surrounding structures were obtained without intravenous contrast. Angiographic images of the Circle of Willis were obtained using MRA technique without intravenous contrast. Angiographic images of the neck were obtained using MRA technique without  intravenous contrast. Carotid stenosis measurements (when applicable) are obtained utilizing NASCET criteria, using the distal internal carotid diameter as the denominator. COMPARISON:  Prior CT from earlier the same day. FINDINGS: MRI HEAD FINDINGS Brain: Examination moderately degraded by motion artifact as well as the patient's inability to tolerate the full length of the exam. Age-related cerebral atrophy with mild chronic small vessel ischemic disease. Approximate 2.8 cm focus of restricted diffusion seen extending from the right lentiform nucleus into the right internal capsule and right caudate, consistent with acute to early subacute ischemic infarct (series 7, image 50). This corresponds with hyperdensity seen on prior CT. No associated hemorrhage or mass effect. No other evidence for acute or subacute ischemia. Gray-white matter differentiation otherwise maintained. No encephalomalacia to suggest chronic cortical infarction. No foci of susceptibility artifact to suggest acute or chronic intracranial hemorrhage. No mass lesion, midline shift or mass effect. No hydrocephalus. No extra-axial fluid collection. Vascular: Major intracranial vascular flow voids are grossly maintained at the skull base. Skull and upper cervical spine: Grossly unremarkable on this limited exam. Sinuses/Orbits: Patient status post bilateral ocular lens replacement. Paranasal sinuses are largely clear. Trace left mastoid effusion, of doubtful significance. Other: None. MRA HEAD FINDINGS ANTERIOR CIRCULATION: Examination severely degraded by motion artifact. Distal cervical segments of  the internal carotid arteries are grossly patent bilaterally. Petrous, cervical, and cavernous ICAs patent to the termini without obvious high-grade stenosis, although evaluation fairly limited. Partially visualized A1 segments grossly patent. Anterior communicating artery not well assessed due to motion. Visualized anterior cerebral arteries patent to  their distal aspects without obvious stenosis. M1 segments patent bilaterally without obvious stenosis. Evaluation of the MCA bifurcations limited by motion. Visualized distal MCA branches grossly perfused and symmetric, although poorly assessed on this limited exam. POSTERIOR CIRCULATION: Partially visualized distal V4 segments grossly patent and symmetric bilaterally. Posterior inferior cerebral arteries not seen. Visualized basilar patent to its distal aspect without obvious stenosis. Superior cerebral arteries grossly patent at their origins. Both of the PCAs appear to be primarily supplied via the basilar. PCAs grossly patent to their distal P2/P3 segments, not well assessed distally due to motion. No obvious intracranial aneurysm. MRA NECK FINDINGS Examination severely degraded by motion artifact and lack of IV contrast. Partially visualized common carotid arteries grossly patent and symmetric bilaterally. Carotid bifurcations poorly assessed and not well seen due to extensive motion artifact. Partially visualized internal carotid arteries patent with antegrade flow. No appreciable stenosis or vascular occlusion. Origin of the vertebral arteries not assessed on this exam. Partially visualized vertebral arteries grossly patent with antegrade flow within the neck. No obvious stenosis or vascular occlusion. IMPRESSION: MRI HEAD IMPRESSION: 1. Technically limited exam due to extensive motion artifact and patient's inability to tolerate the full length of the study. 2. Approximate 2.8 cm acute to early subacute ischemic infarct involving the right basal ganglia as above. No associated hemorrhage or mass effect. 3. Underlying age-related cerebral atrophy with mild chronic small vessel ischemic disease. MRA HEAD IMPRESSION: 1. Severely limited exam due to extensive motion artifact. 2. Gross patency of the major arterial intracranial vasculature. No obvious vascular occlusion or focal high-grade stenosis. MRA NECK  IMPRESSION: 1. Severely limited exam due to extensive motion artifact. 2. Gross patency of the major arterial vasculature within the neck. No vascular occlusion or obvious flow-limiting stenosis. Electronically Signed   By: Jeannine Boga M.D.   On: 03/30/2019 21:32   Mr Angio Neck Wo Contrast  Result Date: 03/30/2019 CLINICAL DATA:  Follow-up examination for acute stroke. EXAM: MRI HEAD WITHOUT CONTRAST MRA HEAD WITHOUT CONTRAST MRA NECK WITHOUT CONTRAST TECHNIQUE: Multiplanar, multiecho pulse sequences of the brain and surrounding structures were obtained without intravenous contrast. Angiographic images of the Circle of Willis were obtained using MRA technique without intravenous contrast. Angiographic images of the neck were obtained using MRA technique without intravenous contrast. Carotid stenosis measurements (when applicable) are obtained utilizing NASCET criteria, using the distal internal carotid diameter as the denominator. COMPARISON:  Prior CT from earlier the same day. FINDINGS: MRI HEAD FINDINGS Brain: Examination moderately degraded by motion artifact as well as the patient's inability to tolerate the full length of the exam. Age-related cerebral atrophy with mild chronic small vessel ischemic disease. Approximate 2.8 cm focus of restricted diffusion seen extending from the right lentiform nucleus into the right internal capsule and right caudate, consistent with acute to early subacute ischemic infarct (series 7, image 50). This corresponds with hyperdensity seen on prior CT. No associated hemorrhage or mass effect. No other evidence for acute or subacute ischemia. Gray-white matter differentiation otherwise maintained. No encephalomalacia to suggest chronic cortical infarction. No foci of susceptibility artifact to suggest acute or chronic intracranial hemorrhage. No mass lesion, midline shift or mass effect. No hydrocephalus. No extra-axial fluid collection. Vascular: Major intracranial  vascular flow voids are grossly maintained at the skull base. Skull and upper cervical spine: Grossly unremarkable on this limited exam. Sinuses/Orbits: Patient status post bilateral ocular lens replacement. Paranasal sinuses are largely clear. Trace left mastoid effusion, of doubtful significance. Other: None. MRA HEAD FINDINGS ANTERIOR CIRCULATION: Examination severely degraded by motion artifact. Distal cervical segments of the internal carotid arteries are grossly patent bilaterally. Petrous, cervical, and cavernous ICAs patent to the termini without obvious high-grade stenosis, although evaluation fairly limited. Partially visualized A1 segments grossly patent. Anterior communicating artery not well assessed due to motion. Visualized anterior cerebral arteries patent to their distal aspects without obvious stenosis. M1 segments patent bilaterally without obvious stenosis. Evaluation of the MCA bifurcations limited by motion. Visualized distal MCA branches grossly perfused and symmetric, although poorly assessed on this limited exam. POSTERIOR CIRCULATION: Partially visualized distal V4 segments grossly patent and symmetric bilaterally. Posterior inferior cerebral arteries not seen. Visualized basilar patent to its distal aspect without obvious stenosis. Superior cerebral arteries grossly patent at their origins. Both of the PCAs appear to be primarily supplied via the basilar. PCAs grossly patent to their distal P2/P3 segments, not well assessed distally due to motion. No obvious intracranial aneurysm. MRA NECK FINDINGS Examination severely degraded by motion artifact and lack of IV contrast. Partially visualized common carotid arteries grossly patent and symmetric bilaterally. Carotid bifurcations poorly assessed and not well seen due to extensive motion artifact. Partially visualized internal carotid arteries patent with antegrade flow. No appreciable stenosis or vascular occlusion. Origin of the vertebral  arteries not assessed on this exam. Partially visualized vertebral arteries grossly patent with antegrade flow within the neck. No obvious stenosis or vascular occlusion. IMPRESSION: MRI HEAD IMPRESSION: 1. Technically limited exam due to extensive motion artifact and patient's inability to tolerate the full length of the study. 2. Approximate 2.8 cm acute to early subacute ischemic infarct involving the right basal ganglia as above. No associated hemorrhage or mass effect. 3. Underlying age-related cerebral atrophy with mild chronic small vessel ischemic disease. MRA HEAD IMPRESSION: 1. Severely limited exam due to extensive motion artifact. 2. Gross patency of the major arterial intracranial vasculature. No obvious vascular occlusion or focal high-grade stenosis. MRA NECK IMPRESSION: 1. Severely limited exam due to extensive motion artifact. 2. Gross patency of the major arterial vasculature within the neck. No vascular occlusion or obvious flow-limiting stenosis. Electronically Signed   By: Jeannine Boga M.D.   On: 03/30/2019 21:32   Mr Brain Wo Contrast  Result Date: 03/30/2019 CLINICAL DATA:  Follow-up examination for acute stroke. EXAM: MRI HEAD WITHOUT CONTRAST MRA HEAD WITHOUT CONTRAST MRA NECK WITHOUT CONTRAST TECHNIQUE: Multiplanar, multiecho pulse sequences of the brain and surrounding structures were obtained without intravenous contrast. Angiographic images of the Circle of Willis were obtained using MRA technique without intravenous contrast. Angiographic images of the neck were obtained using MRA technique without intravenous contrast. Carotid stenosis measurements (when applicable) are obtained utilizing NASCET criteria, using the distal internal carotid diameter as the denominator. COMPARISON:  Prior CT from earlier the same day. FINDINGS: MRI HEAD FINDINGS Brain: Examination moderately degraded by motion artifact as well as the patient's inability to tolerate the full length of the exam.  Age-related cerebral atrophy with mild chronic small vessel ischemic disease. Approximate 2.8 cm focus of restricted diffusion seen extending from the right lentiform nucleus into the right internal capsule and right caudate, consistent with acute to early subacute ischemic infarct (series 7, image 50). This corresponds with hyperdensity seen on prior  CT. No associated hemorrhage or mass effect. No other evidence for acute or subacute ischemia. Gray-white matter differentiation otherwise maintained. No encephalomalacia to suggest chronic cortical infarction. No foci of susceptibility artifact to suggest acute or chronic intracranial hemorrhage. No mass lesion, midline shift or mass effect. No hydrocephalus. No extra-axial fluid collection. Vascular: Major intracranial vascular flow voids are grossly maintained at the skull base. Skull and upper cervical spine: Grossly unremarkable on this limited exam. Sinuses/Orbits: Patient status post bilateral ocular lens replacement. Paranasal sinuses are largely clear. Trace left mastoid effusion, of doubtful significance. Other: None. MRA HEAD FINDINGS ANTERIOR CIRCULATION: Examination severely degraded by motion artifact. Distal cervical segments of the internal carotid arteries are grossly patent bilaterally. Petrous, cervical, and cavernous ICAs patent to the termini without obvious high-grade stenosis, although evaluation fairly limited. Partially visualized A1 segments grossly patent. Anterior communicating artery not well assessed due to motion. Visualized anterior cerebral arteries patent to their distal aspects without obvious stenosis. M1 segments patent bilaterally without obvious stenosis. Evaluation of the MCA bifurcations limited by motion. Visualized distal MCA branches grossly perfused and symmetric, although poorly assessed on this limited exam. POSTERIOR CIRCULATION: Partially visualized distal V4 segments grossly patent and symmetric bilaterally. Posterior  inferior cerebral arteries not seen. Visualized basilar patent to its distal aspect without obvious stenosis. Superior cerebral arteries grossly patent at their origins. Both of the PCAs appear to be primarily supplied via the basilar. PCAs grossly patent to their distal P2/P3 segments, not well assessed distally due to motion. No obvious intracranial aneurysm. MRA NECK FINDINGS Examination severely degraded by motion artifact and lack of IV contrast. Partially visualized common carotid arteries grossly patent and symmetric bilaterally. Carotid bifurcations poorly assessed and not well seen due to extensive motion artifact. Partially visualized internal carotid arteries patent with antegrade flow. No appreciable stenosis or vascular occlusion. Origin of the vertebral arteries not assessed on this exam. Partially visualized vertebral arteries grossly patent with antegrade flow within the neck. No obvious stenosis or vascular occlusion. IMPRESSION: MRI HEAD IMPRESSION: 1. Technically limited exam due to extensive motion artifact and patient's inability to tolerate the full length of the study. 2. Approximate 2.8 cm acute to early subacute ischemic infarct involving the right basal ganglia as above. No associated hemorrhage or mass effect. 3. Underlying age-related cerebral atrophy with mild chronic small vessel ischemic disease. MRA HEAD IMPRESSION: 1. Severely limited exam due to extensive motion artifact. 2. Gross patency of the major arterial intracranial vasculature. No obvious vascular occlusion or focal high-grade stenosis. MRA NECK IMPRESSION: 1. Severely limited exam due to extensive motion artifact. 2. Gross patency of the major arterial vasculature within the neck. No vascular occlusion or obvious flow-limiting stenosis. Electronically Signed   By: Jeannine Boga M.D.   On: 03/30/2019 21:32   Ct Head Code Stroke Wo Contrast  Result Date: 03/30/2019 CLINICAL DATA:  Code stroke.  Acute onset  left-sided weakness. EXAM: CT HEAD WITHOUT CONTRAST TECHNIQUE: Contiguous axial images were obtained from the base of the skull through the vertex without intravenous contrast. COMPARISON:  None. FINDINGS: Brain: An ill-defined area of hypoattenuation is present within the right lentiform nucleus. This extends superiorly to involve the internal capsule and corona radiata. No other acute cortical right-sided infarcts are present. Moderate generalized atrophy and diffuse white matter disease is present bilaterally. Remote lacunar infarcts are present in the thalami bilaterally. White matter changes extend into the brainstem. The cerebellum is unremarkable. The ventricles are proportionate to the degree of atrophy. No significant  extraaxial fluid collection is present. Vascular: Atherosclerotic calcifications are present within the cavernous internal carotid arteries bilaterally. There is no asymmetric hyperdense vessel. Skull: Calvarium is intact. No focal lytic or blastic lesions are present. Sinuses/Orbits: The paranasal sinuses and mastoid air cells are clear. Bilateral lens replacements are noted. Globes and orbits are otherwise unremarkable. ASPECTS [facility] Stroke Program Early CT Score) - Ganglionic level infarction (caudate, lentiform nuclei, internal capsule, insula, M1-M3 cortex): 5/7 - Supraganglionic infarction (M4-M6 cortex): 3/3 Total score (0-10 with 10 being normal): 8/10 IMPRESSION: 1. Age indeterminate infarct involving the right basal ganglia, internal capsule, and corona radiata. This may be acute. 2. Moderate advanced generalized atrophy and white matter disease likely reflects the sequela of chronic microvascular ischemia. 3. Atherosclerosis 4. ASPECTS is 8/10 The above was relayed via text pager to Dr. Cheral Marker on 03/30/2019 at 11:58 AM. Electronically Signed   By: San Morelle M.D.   On: 03/30/2019 12:06    PHYSICAL EXAM  Temp:  [97.9 F (36.6 C)] 97.9 F (36.6 C) (09/22  1002) Pulse Rate:  [50-64] 56 (09/22 1002) Resp:  [12-19] 16 (09/22 0400) BP: (112-168)/(53-105) 112/71 (09/22 1002) SpO2:  [98 %-99 %] 98 % (09/22 1002)  General - Well nourished, well developed, lethargic.  Ophthalmologic - fundi not visualized due to noncooperation.  Cardiovascular - Regular rhythm and rate.  Mental Status -  Level of arousal and orientation to self, place and person were intact, but not orientated to her age and time. Language including expression, naming, repetition, comprehension was assessed and found intact. However, moderate to severe dysarthria  Cranial Nerves II - XII - II - Visual field intact OU. III, IV, VI - Extraocular movements intact. V - Facial sensation intact bilaterally. VII - left facial droop. VIII - Hearing & vestibular intact bilaterally. X - Palate elevates symmetrically. XI - Chin turning & shoulder shrug intact bilaterally. XII - Tongue protrusion intact.  Motor Strength - The patient's strength was normal in right upper and lower extremities, however, left upper extremity 4/5 with pronator drift and right lower extremity 4/5 proximal and 4+/5 distally.  Bulk was normal and fasciculations were absent.   Motor Tone - Muscle tone was assessed at the neck and appendages and was normal.  Reflexes - The patient's reflexes were symmetrical in all extremities and she had no pathological reflexes.  Sensory - Light touch, temperature/pinprick were assessed and were symmetrical.    Coordination - The patient had normal movements in the hands with no ataxia or dysmetria.  Tremor was absent.  Gait and Station - deferred.   ASSESSMENT/PLAN Ms. JAKEIRA MCMURREY is a 85 y.o. female with history of osteoarthritis, hypothyroidism, hypertension, hyperlipidemia, diabetes, basal cell cancer presenting with left-sided weakness.    Stroke:  R BG/CR infarct secondary to small vessel disease source given risk factors  Code Stroke CT head age  indeterminate R BG, IC and CR infarct.   Small vessel disease. Atrophy.  Atherosclerosis.  ASPECTS 8    MRI acute to early subacute R BG infarct.  Small vessel disease.  Atrophy.  MRA head gross patency  MRA neck gross patency  2D Echo EF 60 to 65%.  LA mildly dilated.  LDL 55  HgbA1c 8.5  SCDs for VTE prophylaxis  No antithrombotic prior to admission, now on aspirin 81 mg daily.  Continue aspirin 81 given thrombocytopenia.  If platelet lower than 50, aspirin 81 can be discontinued.   Therapy recommendations: CIR  Disposition: Pending  (Lives with  daughter PTA, Baseline mRS 4.)  Hypertension  Stable . Permissive hypertension (OK if < 220/120) but gradually normalize in 3-5 days . Long-term BP goal normotensive  Hyperlipidemia  Home meds: Crestor 10 and fish oil  Now on Crestor 10  LDL 55, goal < 70  continue home statin regimen at discharge  Diabetes type II Uncontrolled  HgbA1c 8.5, goal < 7.0  CBGs  SSI  Uncontrolled  Close PCP follow-up for better DM control  Thrombocytopenia  Chronic  Platelet 91, continue monitoring  Continue aspirin 81  If platelet less than 50, aspirin 81 can be discontinued.  Other Stroke Risk Factors  Advanced age  Obesity, Body mass index is 31.29 kg/m., recommend weight loss, diet and exercise as appropriate   Other Active Problems  Hypothyroidism  Chronic kidney disease stage IIIb, creatinine 1.16-1.00  Macrocytic anemia  Following with Dr. Leta Baptist at [facility] for numbness  Hospital day # 1  Neurology will sign off. Please call with questions. Pt will follow up with Dr. Corwin Levins at [facility] in about 4 weeks. Thanks for the consult.  Rosalin Hawking, MD PhD Stroke Neurology 03/31/2019 3:48 PM  To contact Stroke Continuity provider, please refer to [email or web address] After hours, contact General Neurology

## 2019-03-31 NOTE — Progress Notes (Signed)
RN was feeding patient and pt got chocked on the eggs, RN cut up food into small bites and gave fluids with the meals. Pt was able to clear her mouth with coughing,  RN informed MD, speech to see patient.

## 2019-04-01 LAB — GLUCOSE, CAPILLARY
Glucose-Capillary: 119 mg/dL — ABNORMAL HIGH (ref 70–99)
Glucose-Capillary: 250 mg/dL — ABNORMAL HIGH (ref 70–99)
Glucose-Capillary: 224 mg/dL — ABNORMAL HIGH (ref 70–99)
Glucose-Capillary: 123 mg/dL — ABNORMAL HIGH (ref 70–99)
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
## 2019-04-01 NOTE — Progress Notes (Signed)
  Speech Language Pathology Treatment: Dysphagia  Patient Details Name: Stacey Harris MRN: [id number] DOB: 28-Nov-1928 Today's Date: 04/01/2019 Time: 1350-1402 SLP Time Calculation (min) (ACUTE ONLY): 12 min  Assessment / Plan / Recommendation Clinical Impression  Pt was encountered lethargic in bed with daughter present at bedside. RN and pt's daughter reported that pt's lethargy was secondary to poor sleep last night.   Pt was observed with trials of Dysphagia 3 (mech soft) solids and thin liquid on her lunch meal tray.  Pt initially required total assistance for po intake; however, she transitioned to feeding herself given set up and verbal encouragement from her daughter.  She exhibited mildly prolonged mastication of soft solids, but no clinical s/sx of aspiration were observed with any trials and daughter reported improved po tolerance.  Pt and daughter were educated regarding aspiration precautions including taking small bites/sips, sitting upright, etc.  Pt would benefit from reinforcement.  Pt was additionally provided with a handout detailing information about dysarthria and speech intelligibility strategies.  Pt was too lethargic to participate in dysarthria treatment on this date; however, her speech was >75% intelligible to an unfamiliar listener.  Recommend continuation of Dysphagia 3 (mech soft) solids and thin liquid with set up and supervision to implement aspiration precautions.    HPI HPI: Pt is an 85 y.o. female with medical history significant of hypertension, hyperlipidemia, diabetes mellitus, chronic kidney disease stage III, myelodysplastic syndrome, chronic thrombocytopenia, macrocytic anemia, hypothyroidism, arthritis brought by EMS to emergency department secondary to left-sided weakness and slurred speech. MRI of the brain revealed 2.8 cm acute to early subacute ischemic infarct involving the right basal ganglia.       SLP Plan  Continue with current plan of care        Recommendations  Diet recommendations: Dysphagia 3 (mechanical soft);Thin liquid Liquids provided via: Cup;Straw Medication Administration: Whole meds with puree Supervision: Full supervision/cueing for compensatory strategies Compensations: Minimize environmental distractions;Slow rate;Small sips/bites Postural Changes and/or Swallow Maneuvers: Seated upright 90 degrees                Oral Care Recommendations: Oral care BID Follow up Recommendations: Inpatient Rehab SLP Visit Diagnosis: Dysphagia, unspecified (R13.10) Plan: Continue with current plan of care       Bretta Bang, M.S., [place] Office: [phone]               [place] 04/01/2019, 2:07 PM
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
## 2019-04-01 NOTE — Progress Notes (Signed)
Report called to 3W, spoke to [employer]  Informed pt and pt family of plan of care  Pt has all belongings and transported via bed

## 2019-04-01 NOTE — Progress Notes (Signed)
Physical Therapy Treatment Patient Details Name: Stacey Harris MRN: [id number] DOB: 09-Oct-1928 Today's Date: 04/01/2019    History of Present Illness Stacey Harris is a 85 y.o. female with medical history significant of hypertension, hyperlipidemia, diabetes mellitus, chronic kidney disease stage III, myelodysplastic syndrome, chronic thrombocytopenia, macrocytic anemia, hypothyroidism, arthritis brought by EMS to emergency department with slurred speech and L sided weakness; MRI reveals acute to early subacute ischemic infarct Rt Basal ganglia     PT Comments    Continuing work on functional mobility and activity tolerance;  Able to initiate gait training today, requiring +2 assist for initial walking; Manual facilitation for weight shift into stance (for both R and L LEs), and physical assist to advance LLE for stepping; Very receptive to cues for technique;   Daughter, [name], present, and we discussed interacting with [name] from the L side;   Continue to recommend comprehensive inpatient rehab (CIR) for post-acute therapy needs.   Follow Up Recommendations  CIR     Equipment Recommendations  3in1 (PT)    Recommendations for Other Services       Precautions / Restrictions Precautions Precautions: Fall    Mobility  Bed Mobility Overal bed mobility: Needs Assistance Bed Mobility: Supine to Sit     Supine to sit: Mod assist     General bed mobility comments: Step-by-step cues for technique; Good initiation of pushing self up from R semi-sidelying to sit with use of bedrail; also noted good organization of weight and trunk to initiate reciprocal scoot (inefficient and taxing scooting)  Transfers Overall transfer level: Needs assistance Equipment used: Rolling walker (2 wheeled) Transfers: Sit to/from [employer] Sit to Stand: Mod assist;+2 safety/equipment Stand pivot transfers: Mod assist       General transfer comment: Light mod power up  and steady assist to stand; Heavy moderate assist for balance and support with trying to take pivotal steps to the recliner  Ambulation/Gait Ambulation/Gait assistance: Mod assist;+2 physical assistance;+2 safety/equipment Gait Distance (Feet): 5 Feet Assistive device: Rolling walker (2 wheeled) Gait Pattern/deviations: Decreased step length - left     General Gait Details: Manual facilitation of weight shift into L stance to allow for R foot advancement; assist t advance LLE to step   Stairs             Wheelchair Mobility    Modified Rankin (Stroke Patients Only) Modified Rankin (Stroke Patients Only) Pre-Morbid Rankin Score: No significant disability Modified Rankin: Severe disability     Balance Overall balance assessment: Needs assistance Sitting-balance support: Feet supported;Bilateral upper extremity supported Sitting balance-Leahy Scale: Fair       Standing balance-Leahy Scale: Poor                              Cognition Arousal/Alertness: Awake/alert Behavior During Therapy: WFL for tasks assessed/performed Overall Cognitive Status: Impaired/Different from baseline Area of Impairment: Attention;Problem solving;Following commands                   Current Attention Level: (L inattention)   Following Commands: Follows one step commands with increased time     Problem Solving: Slow processing;Decreased initiation;Difficulty sequencing;Requires verbal cues;Requires tactile cues        Exercises      General Comments        Pertinent Vitals/Pain Pain Assessment: Faces Faces Pain Scale: No hurt    Home Living  Prior Function            PT Goals (current goals can now be found in the care plan section) Acute Rehab PT Goals Patient Stated Goal: to go home PT Goal Formulation: With patient Time For Goal Achievement: 04/14/19 Potential to Achieve Goals: Good Progress towards PT goals:  Progressing toward goals    Frequency    Min 4X/week      PT Plan Current plan remains appropriate    Co-evaluation              AM-PAC PT "6 Clicks" Mobility   Outcome Measure  Help needed turning from your back to your side while in a flat bed without using bedrails?: A Little Help needed moving from lying on your back to sitting on the side of a flat bed without using bedrails?: A Lot Help needed moving to and from a bed to a chair (including a wheelchair)?: A Lot Help needed standing up from a chair using your arms (e.g., wheelchair or bedside chair)?: A Lot Help needed to walk in hospital room?: A Lot Help needed climbing 3-5 steps with a railing? : Total 6 Click Score: 12    End of Session Equipment Utilized During Treatment: Gait belt Activity Tolerance: Patient tolerated treatment well Patient left: in chair;with call bell/phone within reach;with chair alarm set Nurse Communication: Mobility status PT Visit Diagnosis: Hemiplegia and hemiparesis;Other symptoms and signs involving the nervous system (R29.898) Hemiplegia - Right/Left: Left Hemiplegia - dominant/non-dominant: Non-dominant Hemiplegia - caused by: Cerebral infarction     Time: [id number] PT Time Calculation (min) (ACUTE ONLY): 25 min  Charges:  $Gait Training: 8-22 mins $Therapeutic Activity: 8-22 mins                     Roney Marion, PT  Acute Rehabilitation Services Pager [phone] Office [place] 04/01/2019, 5:07 PM

## 2019-04-01 NOTE — Progress Notes (Signed)
Patient ID: Stacey Harris, female   DOB: [birth year], 85 y.o.   MRN: [id number]  PROGRESS NOTE    Stacey Harris  [id number] DOB: [birth year] DOA: 03/30/2019 PCP: [name], MD   Brief Narrative:  85 year old female with history of hypertension, hyperlipidemia, type 2 diabetes mellitus on oral hypoglycemics, CKD stage III, myelodysplastic syndrome, chronic thrombocytopenia, microcytic anemia, hypothyroidism, arthritis presented on 03/30/2019 with concern for stroke as patient had trouble speaking with left-sided weakness.  CT of the brain showed right thalamic stroke; patient was outside TPA window.  Neurology was consulted.  Assessment & Plan:   Acute ischemic stroke presenting with dysarthria with left hemiparesis -CT head showed right basal ganglia, internal capsule and corona radiata acute stroke -MRI of the brain showed subacute ischemic infarct of the right basal ganglia.  No hemorrhage -MRA of the head and neck showed no large vessel occlusion and no high-grade stenosis -2D echo showed EF of 60 to 65%.  No source of embolism was identified -LDL 55.  Continue statin -Continue low-dose aspirin 81 mg daily because of thrombocytopenia and easy bruising -Hemoglobin A1c 8.5. -Neurology has signed off and recommend outpatient follow-up with neurology -PT/OT recommend CIR placement.  CIR consulted.  Hypertension  -Monitor blood pressure.  Allow permissive hypertension  Diabetes mellitus type 2 uncontrolled with hyperglycemia -A1c 8.5.  On Amaryl and metformin at home.  Continue glimepiride.  Continue CBGs with SSI.  Myelodysplastic syndrome and chronic thrombocytopenia -Stable.  Outpatient follow-up  Acute kidney injury on chronic kidney disease stage III -Resolved  DVT prophylaxis: SCDs Code Status: DNR  family Communication: None at bedside Disposition Plan: CIR once bed is available  Consultants: Neurology  Procedures:  Echo IMPRESSIONS    1. Left ventricular  ejection fraction, by visual estimation, is 60 to 65%. The left ventricle has normal function. Normal left ventricular size. There is no left ventricular hypertrophy.  2. Global right ventricle has normal systolic function.The right ventricular size is normal. No increase in right ventricular wall thickness.  3. Left atrial size was mildly dilated.  4. Right atrial size was normal.  5. Moderate mitral annular calcification.  6. The mitral valve is normal in structure. Mild to moderate mitral valve regurgitation. No evidence of mitral stenosis.  7. The tricuspid valve is normal in structure. Tricuspid valve regurgitation is mild.  8. The aortic valve is normal in structure. Aortic valve regurgitation is trivial by color flow Doppler. Structurally normal aortic valve, with no evidence of sclerosis or stenosis.  9. The pulmonic valve was normal in structure. Pulmonic valve regurgitation is trivial by color flow Doppler. 10. Normal pulmonary artery systolic pressure. 11. The inferior vena cava is normal in size with greater than 50% respiratory variability, suggesting right atrial pressure of 3 mmHg. 12. No embolic source identified.  Antimicrobials:  None  Subjective: Patient seen and examined at bedside.  She is sleepy, wakes up slightly, is drowsy, hardly answers any questions.  No overnight fever or vomiting reported.  Objective: Vitals:   03/31/19 2053 04/01/19 0016 04/01/19 0331 04/01/19 1244  BP: 134/70 134/67 (!) 137/52 (!) 129/59  Pulse: (!) 52 65 65 67  Resp:  15  18  Temp: 98.6 F (37 C) 98.1 F (36.7 C) 98.2 F (36.8 C) 98.8 F (37.1 C)  TempSrc: Oral Oral Oral Oral  SpO2: 99% 96% 98% 96%  Weight:      Height:        Intake/Output Summary (Last 24 hours) at 04/01/2019  [place] filed at 04/01/2019 0333 Gross per 24 hour  Intake 180 ml  Output 700 ml  Net -520 ml   Filed Weights   03/30/19 1100 03/30/19 1205  Weight: 77.6 kg 77.6 kg     Examination:  General exam: Appears calm and comfortable.  Elderly female, sleepy, wakes up very slightly, hardly answers any questions. Respiratory system: Bilateral decreased breath sounds at bases Cardiovascular system: S1 & S2 heard, Rate controlled Gastrointestinal system: Abdomen is nondistended, soft and nontender. Normal bowel sounds heard. Extremities: No cyanosis, clubbing, edema    Data Reviewed: I have personally reviewed following labs and imaging studies  CBC: Recent Labs  Lab 03/30/19 1138 03/30/19 1149 03/30/19 1628  WBC 6.6  --   --   NEUTROABS 3.3  --   --   HGB 10.4* 10.2*  --   HCT 32.8* 30.0* 28.7*  MCV 105.1*  --   --   PLT 91*  --   --    Basic Metabolic Panel: Recent Labs  Lab 03/30/19 1138 03/30/19 1149  NA 139 139  K 4.2 4.6  CL 106 104  CO2 24  --   GLUCOSE 256* 243*  BUN 19 26*  CREATININE 1.16* 1.00  CALCIUM 9.3  --    GFR: Estimated Creatinine Clearance: 38.2 mL/min (by C-G formula based on SCr of 1 mg/dL). Liver Function Tests: Recent Labs  Lab 03/30/19 1138  AST 46*  ALT 23  ALKPHOS 54  BILITOT 1.4*  PROT 6.0*  ALBUMIN 2.6*   No results for input(s): LIPASE, AMYLASE in the last 168 hours. No results for input(s): AMMONIA in the last 168 hours. Coagulation Profile: Recent Labs  Lab 03/30/19 1138  INR 1.3*   Cardiac Enzymes: No results for input(s): CKTOTAL, CKMB, CKMBINDEX, TROPONINI in the last 168 hours. BNP (last 3 results) No results for input(s): PROBNP in the last 8760 hours. HbA1C: Recent Labs    03/31/19 0638  HGBA1C 8.5*   CBG: Recent Labs  Lab 03/31/19 1213 03/31/19 1700 03/31/19 2053 04/01/19 0642 04/01/19 1128  GLUCAP 188* 215* 150* 119* 250*   Lipid Profile: Recent Labs    03/31/19 0638  CHOL 92  HDL 20*  LDLCALC 55  TRIG 84  CHOLHDL 4.6   Thyroid Function Tests: Recent Labs    03/30/19 1628  TSH 1.543   Anemia Panel: Recent Labs    03/30/19 1628  VITAMINB12 880    Sepsis Labs: No results for input(s): PROCALCITON, LATICACIDVEN in the last 168 hours.  Recent Results (from the past 240 hour(s))  SARS CORONAVIRUS 2 (TAT 6-24 HRS) Nasopharyngeal Nasopharyngeal Swab     Status: None   Collection Time: 03/30/19  1:30 PM   Specimen: Nasopharyngeal Swab  Result Value Ref Range Status   SARS Coronavirus 2 NEGATIVE NEGATIVE Final    Comment: (NOTE) SARS-CoV-2 target nucleic acids are NOT DETECTED. The SARS-CoV-2 RNA is generally detectable in upper and lower respiratory specimens during the acute phase of infection. Negative results do not preclude SARS-CoV-2 infection, do not rule out co-infections with other pathogens, and should not be used as the sole basis for treatment or other patient management decisions. Negative results must be combined with clinical observations, patient history, and epidemiological information. The expected result is Negative. Fact Sheet for Patients: SugarRoll.be Fact Sheet for Healthcare Providers: https://www.woods-mathews.com/ This test is not yet approved or cleared by the [place] FDA and  has been authorized for detection and/or diagnosis of SARS-CoV-2 by  FDA under an Emergency Use Authorization (EUA). This EUA will remain  in effect (meaning this test can be used) for the duration of the COVID-19 declaration under Section 56 4(b)(1) of the Act, 21 U.S.C. section 360bbb-3(b)(1), unless the authorization is terminated or revoked sooner. Performed at [facility], [place] [address], [place], [place] [address]          Radiology Studies: Mr Angio Head Wo Contrast  Result Date: 03/30/2019 CLINICAL DATA:  Follow-up examination for acute stroke. EXAM: MRI HEAD WITHOUT CONTRAST MRA HEAD WITHOUT CONTRAST MRA NECK WITHOUT CONTRAST TECHNIQUE: Multiplanar, multiecho pulse sequences of the brain and surrounding structures were obtained without intravenous contrast.  Angiographic images of the Circle of Willis were obtained using MRA technique without intravenous contrast. Angiographic images of the neck were obtained using MRA technique without intravenous contrast. Carotid stenosis measurements (when applicable) are obtained utilizing NASCET criteria, using the distal internal carotid diameter as the denominator. COMPARISON:  Prior CT from earlier the same day. FINDINGS: MRI HEAD FINDINGS Brain: Examination moderately degraded by motion artifact as well as the patient's inability to tolerate the full length of the exam. Age-related cerebral atrophy with mild chronic small vessel ischemic disease. Approximate 2.8 cm focus of restricted diffusion seen extending from the right lentiform nucleus into the right internal capsule and right caudate, consistent with acute to early subacute ischemic infarct (series 7, image 50). This corresponds with hyperdensity seen on prior CT. No associated hemorrhage or mass effect. No other evidence for acute or subacute ischemia. Gray-white matter differentiation otherwise maintained. No encephalomalacia to suggest chronic cortical infarction. No foci of susceptibility artifact to suggest acute or chronic intracranial hemorrhage. No mass lesion, midline shift or mass effect. No hydrocephalus. No extra-axial fluid collection. Vascular: Major intracranial vascular flow voids are grossly maintained at the skull base. Skull and upper cervical spine: Grossly unremarkable on this limited exam. Sinuses/Orbits: Patient status post bilateral ocular lens replacement. Paranasal sinuses are largely clear. Trace left mastoid effusion, of doubtful significance. Other: None. MRA HEAD FINDINGS ANTERIOR CIRCULATION: Examination severely degraded by motion artifact. Distal cervical segments of the internal carotid arteries are grossly patent bilaterally. Petrous, cervical, and cavernous ICAs patent to the termini without obvious high-grade stenosis, although  evaluation fairly limited. Partially visualized A1 segments grossly patent. Anterior communicating artery not well assessed due to motion. Visualized anterior cerebral arteries patent to their distal aspects without obvious stenosis. M1 segments patent bilaterally without obvious stenosis. Evaluation of the MCA bifurcations limited by motion. Visualized distal MCA branches grossly perfused and symmetric, although poorly assessed on this limited exam. POSTERIOR CIRCULATION: Partially visualized distal V4 segments grossly patent and symmetric bilaterally. Posterior inferior cerebral arteries not seen. Visualized basilar patent to its distal aspect without obvious stenosis. Superior cerebral arteries grossly patent at their origins. Both of the PCAs appear to be primarily supplied via the basilar. PCAs grossly patent to their distal P2/P3 segments, not well assessed distally due to motion. No obvious intracranial aneurysm. MRA NECK FINDINGS Examination severely degraded by motion artifact and lack of IV contrast. Partially visualized common carotid arteries grossly patent and symmetric bilaterally. Carotid bifurcations poorly assessed and not well seen due to extensive motion artifact. Partially visualized internal carotid arteries patent with antegrade flow. No appreciable stenosis or vascular occlusion. Origin of the vertebral arteries not assessed on this exam. Partially visualized vertebral arteries grossly patent with antegrade flow within the neck. No obvious stenosis or vascular occlusion. IMPRESSION: MRI HEAD IMPRESSION: 1. Technically limited  exam due to extensive motion artifact and patient's inability to tolerate the full length of the study. 2. Approximate 2.8 cm acute to early subacute ischemic infarct involving the right basal ganglia as above. No associated hemorrhage or mass effect. 3. Underlying age-related cerebral atrophy with mild chronic small vessel ischemic disease. MRA HEAD IMPRESSION: 1.  Severely limited exam due to extensive motion artifact. 2. Gross patency of the major arterial intracranial vasculature. No obvious vascular occlusion or focal high-grade stenosis. MRA NECK IMPRESSION: 1. Severely limited exam due to extensive motion artifact. 2. Gross patency of the major arterial vasculature within the neck. No vascular occlusion or obvious flow-limiting stenosis. Electronically Signed   By: Jeannine Boga M.D.   On: 03/30/2019 21:32   Mr Angio Neck Wo Contrast  Result Date: 03/30/2019 CLINICAL DATA:  Follow-up examination for acute stroke. EXAM: MRI HEAD WITHOUT CONTRAST MRA HEAD WITHOUT CONTRAST MRA NECK WITHOUT CONTRAST TECHNIQUE: Multiplanar, multiecho pulse sequences of the brain and surrounding structures were obtained without intravenous contrast. Angiographic images of the Circle of Willis were obtained using MRA technique without intravenous contrast. Angiographic images of the neck were obtained using MRA technique without intravenous contrast. Carotid stenosis measurements (when applicable) are obtained utilizing NASCET criteria, using the distal internal carotid diameter as the denominator. COMPARISON:  Prior CT from earlier the same day. FINDINGS: MRI HEAD FINDINGS Brain: Examination moderately degraded by motion artifact as well as the patient's inability to tolerate the full length of the exam. Age-related cerebral atrophy with mild chronic small vessel ischemic disease. Approximate 2.8 cm focus of restricted diffusion seen extending from the right lentiform nucleus into the right internal capsule and right caudate, consistent with acute to early subacute ischemic infarct (series 7, image 50). This corresponds with hyperdensity seen on prior CT. No associated hemorrhage or mass effect. No other evidence for acute or subacute ischemia. Gray-white matter differentiation otherwise maintained. No encephalomalacia to suggest chronic cortical infarction. No foci of  susceptibility artifact to suggest acute or chronic intracranial hemorrhage. No mass lesion, midline shift or mass effect. No hydrocephalus. No extra-axial fluid collection. Vascular: Major intracranial vascular flow voids are grossly maintained at the skull base. Skull and upper cervical spine: Grossly unremarkable on this limited exam. Sinuses/Orbits: Patient status post bilateral ocular lens replacement. Paranasal sinuses are largely clear. Trace left mastoid effusion, of doubtful significance. Other: None. MRA HEAD FINDINGS ANTERIOR CIRCULATION: Examination severely degraded by motion artifact. Distal cervical segments of the internal carotid arteries are grossly patent bilaterally. Petrous, cervical, and cavernous ICAs patent to the termini without obvious high-grade stenosis, although evaluation fairly limited. Partially visualized A1 segments grossly patent. Anterior communicating artery not well assessed due to motion. Visualized anterior cerebral arteries patent to their distal aspects without obvious stenosis. M1 segments patent bilaterally without obvious stenosis. Evaluation of the MCA bifurcations limited by motion. Visualized distal MCA branches grossly perfused and symmetric, although poorly assessed on this limited exam. POSTERIOR CIRCULATION: Partially visualized distal V4 segments grossly patent and symmetric bilaterally. Posterior inferior cerebral arteries not seen. Visualized basilar patent to its distal aspect without obvious stenosis. Superior cerebral arteries grossly patent at their origins. Both of the PCAs appear to be primarily supplied via the basilar. PCAs grossly patent to their distal P2/P3 segments, not well assessed distally due to motion. No obvious intracranial aneurysm. MRA NECK FINDINGS Examination severely degraded by motion artifact and lack of IV contrast. Partially visualized common carotid arteries grossly patent and symmetric bilaterally. Carotid bifurcations poorly  assessed and  not well seen due to extensive motion artifact. Partially visualized internal carotid arteries patent with antegrade flow. No appreciable stenosis or vascular occlusion. Origin of the vertebral arteries not assessed on this exam. Partially visualized vertebral arteries grossly patent with antegrade flow within the neck. No obvious stenosis or vascular occlusion. IMPRESSION: MRI HEAD IMPRESSION: 1. Technically limited exam due to extensive motion artifact and patient's inability to tolerate the full length of the study. 2. Approximate 2.8 cm acute to early subacute ischemic infarct involving the right basal ganglia as above. No associated hemorrhage or mass effect. 3. Underlying age-related cerebral atrophy with mild chronic small vessel ischemic disease. MRA HEAD IMPRESSION: 1. Severely limited exam due to extensive motion artifact. 2. Gross patency of the major arterial intracranial vasculature. No obvious vascular occlusion or focal high-grade stenosis. MRA NECK IMPRESSION: 1. Severely limited exam due to extensive motion artifact. 2. Gross patency of the major arterial vasculature within the neck. No vascular occlusion or obvious flow-limiting stenosis. Electronically Signed   By: Jeannine Boga M.D.   On: 03/30/2019 21:32   Mr Brain Wo Contrast  Result Date: 03/30/2019 CLINICAL DATA:  Follow-up examination for acute stroke. EXAM: MRI HEAD WITHOUT CONTRAST MRA HEAD WITHOUT CONTRAST MRA NECK WITHOUT CONTRAST TECHNIQUE: Multiplanar, multiecho pulse sequences of the brain and surrounding structures were obtained without intravenous contrast. Angiographic images of the Circle of Willis were obtained using MRA technique without intravenous contrast. Angiographic images of the neck were obtained using MRA technique without intravenous contrast. Carotid stenosis measurements (when applicable) are obtained utilizing NASCET criteria, using the distal internal carotid diameter as the denominator.  COMPARISON:  Prior CT from earlier the same day. FINDINGS: MRI HEAD FINDINGS Brain: Examination moderately degraded by motion artifact as well as the patient's inability to tolerate the full length of the exam. Age-related cerebral atrophy with mild chronic small vessel ischemic disease. Approximate 2.8 cm focus of restricted diffusion seen extending from the right lentiform nucleus into the right internal capsule and right caudate, consistent with acute to early subacute ischemic infarct (series 7, image 50). This corresponds with hyperdensity seen on prior CT. No associated hemorrhage or mass effect. No other evidence for acute or subacute ischemia. Gray-white matter differentiation otherwise maintained. No encephalomalacia to suggest chronic cortical infarction. No foci of susceptibility artifact to suggest acute or chronic intracranial hemorrhage. No mass lesion, midline shift or mass effect. No hydrocephalus. No extra-axial fluid collection. Vascular: Major intracranial vascular flow voids are grossly maintained at the skull base. Skull and upper cervical spine: Grossly unremarkable on this limited exam. Sinuses/Orbits: Patient status post bilateral ocular lens replacement. Paranasal sinuses are largely clear. Trace left mastoid effusion, of doubtful significance. Other: None. MRA HEAD FINDINGS ANTERIOR CIRCULATION: Examination severely degraded by motion artifact. Distal cervical segments of the internal carotid arteries are grossly patent bilaterally. Petrous, cervical, and cavernous ICAs patent to the termini without obvious high-grade stenosis, although evaluation fairly limited. Partially visualized A1 segments grossly patent. Anterior communicating artery not well assessed due to motion. Visualized anterior cerebral arteries patent to their distal aspects without obvious stenosis. M1 segments patent bilaterally without obvious stenosis. Evaluation of the MCA bifurcations limited by motion. Visualized  distal MCA branches grossly perfused and symmetric, although poorly assessed on this limited exam. POSTERIOR CIRCULATION: Partially visualized distal V4 segments grossly patent and symmetric bilaterally. Posterior inferior cerebral arteries not seen. Visualized basilar patent to its distal aspect without obvious stenosis. Superior cerebral arteries grossly patent at their origins. Both of the PCAs  appear to be primarily supplied via the basilar. PCAs grossly patent to their distal P2/P3 segments, not well assessed distally due to motion. No obvious intracranial aneurysm. MRA NECK FINDINGS Examination severely degraded by motion artifact and lack of IV contrast. Partially visualized common carotid arteries grossly patent and symmetric bilaterally. Carotid bifurcations poorly assessed and not well seen due to extensive motion artifact. Partially visualized internal carotid arteries patent with antegrade flow. No appreciable stenosis or vascular occlusion. Origin of the vertebral arteries not assessed on this exam. Partially visualized vertebral arteries grossly patent with antegrade flow within the neck. No obvious stenosis or vascular occlusion. IMPRESSION: MRI HEAD IMPRESSION: 1. Technically limited exam due to extensive motion artifact and patient's inability to tolerate the full length of the study. 2. Approximate 2.8 cm acute to early subacute ischemic infarct involving the right basal ganglia as above. No associated hemorrhage or mass effect. 3. Underlying age-related cerebral atrophy with mild chronic small vessel ischemic disease. MRA HEAD IMPRESSION: 1. Severely limited exam due to extensive motion artifact. 2. Gross patency of the major arterial intracranial vasculature. No obvious vascular occlusion or focal high-grade stenosis. MRA NECK IMPRESSION: 1. Severely limited exam due to extensive motion artifact. 2. Gross patency of the major arterial vasculature within the neck. No vascular occlusion or obvious  flow-limiting stenosis. Electronically Signed   By: Jeannine Boga M.D.   On: 03/30/2019 21:32        Scheduled Meds:   stroke: mapping our early stages of recovery book   Does not apply Once   aspirin EC  81 mg Oral Daily   glimepiride  4 mg Oral Q breakfast   insulin aspart  0-15 Units Subcutaneous TID WC   insulin aspart  0-5 Units Subcutaneous QHS   levothyroxine  50 mcg Oral Daily   rosuvastatin  10 mg Oral Daily   sodium chloride flush  3 mL Intravenous Once   Continuous Infusions:   LOS: 2 days        Aline August, MD [facility] 04/01/2019, 1:03 PM

## 2019-04-01 NOTE — Progress Notes (Signed)
Received pt. from ED, report received from [facility]. Pt. Oriented to unit and room. Belongings sent home with family. Patient is oriented to self and has no pain. Bed alarm on, call bell in reach, bed in lowest position. Contrina Oron[place] A Judge Duque

## 2019-04-02 ENCOUNTER — Inpatient Hospital Stay (HOSPITAL_COMMUNITY)
Admission: RE | Admit: 2019-04-02 | Discharge: 2019-04-18 | DRG: 057 | Disposition: A | Discharge status: Home Health | Payer: Medicare HMO | Source: Intra-hospital | Attending: Physical Medicine & Rehabilitation | Admitting: Physical Medicine & Rehabilitation

## 2019-04-02 ENCOUNTER — Encounter (HOSPITAL_COMMUNITY): Payer: Self-pay | Admitting: Physical Medicine and Rehabilitation

## 2019-04-02 DIAGNOSIS — E119 Type 2 diabetes mellitus without complications: Secondary | ICD-10-CM | POA: Diagnosis not present

## 2019-04-02 DIAGNOSIS — E039 Hypothyroidism, unspecified: Secondary | ICD-10-CM | POA: Diagnosis present

## 2019-04-02 DIAGNOSIS — I69354 Hemiplegia and hemiparesis following cerebral infarction affecting left non-dominant side: Principal | ICD-10-CM

## 2019-04-02 DIAGNOSIS — R2689 Other abnormalities of gait and mobility: Secondary | ICD-10-CM | POA: Diagnosis present

## 2019-04-02 DIAGNOSIS — K5900 Constipation, unspecified: Secondary | ICD-10-CM | POA: Diagnosis present

## 2019-04-02 DIAGNOSIS — I10 Essential (primary) hypertension: Secondary | ICD-10-CM

## 2019-04-02 DIAGNOSIS — I69392 Facial weakness following cerebral infarction: Secondary | ICD-10-CM | POA: Diagnosis not present

## 2019-04-02 DIAGNOSIS — E1122 Type 2 diabetes mellitus with diabetic chronic kidney disease: Secondary | ICD-10-CM | POA: Diagnosis present

## 2019-04-02 DIAGNOSIS — I129 Hypertensive chronic kidney disease with stage 1 through stage 4 chronic kidney disease, or unspecified chronic kidney disease: Secondary | ICD-10-CM | POA: Diagnosis present

## 2019-04-02 DIAGNOSIS — E1165 Type 2 diabetes mellitus with hyperglycemia: Secondary | ICD-10-CM

## 2019-04-02 DIAGNOSIS — N183 Chronic kidney disease, stage 3 unspecified: Secondary | ICD-10-CM | POA: Diagnosis not present

## 2019-04-02 DIAGNOSIS — B9620 Unspecified Escherichia coli [E. coli] as the cause of diseases classified elsewhere: Secondary | ICD-10-CM

## 2019-04-02 DIAGNOSIS — Z833 Family history of diabetes mellitus: Secondary | ICD-10-CM

## 2019-04-02 DIAGNOSIS — Z8673 Personal history of transient ischemic attack (TIA), and cerebral infarction without residual deficits: Secondary | ICD-10-CM

## 2019-04-02 DIAGNOSIS — E785 Hyperlipidemia, unspecified: Secondary | ICD-10-CM | POA: Diagnosis present

## 2019-04-02 DIAGNOSIS — I69318 Other symptoms and signs involving cognitive functions following cerebral infarction: Secondary | ICD-10-CM | POA: Diagnosis not present

## 2019-04-02 DIAGNOSIS — I69322 Dysarthria following cerebral infarction: Secondary | ICD-10-CM | POA: Diagnosis not present

## 2019-04-02 DIAGNOSIS — D469 Myelodysplastic syndrome, unspecified: Secondary | ICD-10-CM | POA: Diagnosis not present

## 2019-04-02 DIAGNOSIS — N189 Chronic kidney disease, unspecified: Secondary | ICD-10-CM | POA: Diagnosis present

## 2019-04-02 DIAGNOSIS — D696 Thrombocytopenia, unspecified: Secondary | ICD-10-CM | POA: Diagnosis not present

## 2019-04-02 DIAGNOSIS — I69328 Other speech and language deficits following cerebral infarction: Secondary | ICD-10-CM

## 2019-04-02 DIAGNOSIS — R0989 Other specified symptoms and signs involving the circulatory and respiratory systems: Secondary | ICD-10-CM | POA: Diagnosis present

## 2019-04-02 DIAGNOSIS — I63311 Cerebral infarction due to thrombosis of right middle cerebral artery: Secondary | ICD-10-CM

## 2019-04-02 DIAGNOSIS — N390 Urinary tract infection, site not specified: Secondary | ICD-10-CM | POA: Diagnosis not present

## 2019-04-02 DIAGNOSIS — E1142 Type 2 diabetes mellitus with diabetic polyneuropathy: Secondary | ICD-10-CM | POA: Diagnosis present

## 2019-04-02 DIAGNOSIS — I69398 Other sequelae of cerebral infarction: Secondary | ICD-10-CM | POA: Diagnosis present

## 2019-04-02 DIAGNOSIS — Z8249 Family history of ischemic heart disease and other diseases of the circulatory system: Secondary | ICD-10-CM

## 2019-04-02 DIAGNOSIS — I639 Cerebral infarction, unspecified: Secondary | ICD-10-CM | POA: Diagnosis present

## 2019-04-02 DIAGNOSIS — R7309 Other abnormal glucose: Secondary | ICD-10-CM

## 2019-04-02 DIAGNOSIS — G5602 Carpal tunnel syndrome, left upper limb: Secondary | ICD-10-CM

## 2019-04-02 DIAGNOSIS — N179 Acute kidney failure, unspecified: Secondary | ICD-10-CM

## 2019-04-02 DIAGNOSIS — H3530 Unspecified macular degeneration: Secondary | ICD-10-CM | POA: Diagnosis present

## 2019-04-02 DIAGNOSIS — R7989 Other specified abnormal findings of blood chemistry: Secondary | ICD-10-CM | POA: Diagnosis not present

## 2019-04-02 DIAGNOSIS — Z85828 Personal history of other malignant neoplasm of skin: Secondary | ICD-10-CM

## 2019-04-02 DIAGNOSIS — I6381 Other cerebral infarction due to occlusion or stenosis of small artery: Secondary | ICD-10-CM | POA: Diagnosis present

## 2019-04-02 DIAGNOSIS — Z96641 Presence of right artificial hip joint: Secondary | ICD-10-CM | POA: Diagnosis present

## 2019-04-02 DIAGNOSIS — E7849 Other hyperlipidemia: Secondary | ICD-10-CM

## 2019-04-02 DIAGNOSIS — Z8261 Family history of arthritis: Secondary | ICD-10-CM

## 2019-04-02 LAB — CBC WITH DIFFERENTIAL/PLATELET
Abs Immature Granulocytes: 0.02 10*3/uL (ref 0.00–0.07)
Basophils Absolute: 0 10*3/uL (ref 0.0–0.1)
Basophils Relative: 1 %
Eosinophils Absolute: 0.1 10*3/uL (ref 0.0–0.5)
Eosinophils Relative: 2 %
HCT: 30.2 % — ABNORMAL LOW (ref 36.0–46.0)
Hemoglobin: 10.3 g/dL — ABNORMAL LOW (ref 12.0–15.0)
Immature Granulocytes: 0 %
Lymphocytes Relative: 27 %
Lymphs Abs: 1.7 10*3/uL (ref 0.7–4.0)
MCH: 34.6 pg — ABNORMAL HIGH (ref 26.0–34.0)
MCHC: 34.1 g/dL (ref 30.0–36.0)
MCV: 101.3 fL — ABNORMAL HIGH (ref 80.0–100.0)
Monocytes Absolute: 0.6 10*3/uL (ref 0.1–1.0)
Monocytes Relative: 10 %
Neutro Abs: 3.8 10*3/uL (ref 1.7–7.7)
Neutrophils Relative %: 60 %
Platelets: 71 10*3/uL — ABNORMAL LOW (ref 150–400)
RBC: 2.98 MIL/uL — ABNORMAL LOW (ref 3.87–5.11)
RDW: 14.7 % (ref 11.5–15.5)
WBC: 6.4 10*3/uL (ref 4.0–10.5)
nRBC: 0 % (ref 0.0–0.2)

## 2019-04-02 LAB — BASIC METABOLIC PANEL
Anion gap: 10 (ref 5–15)
BUN: 30 mg/dL — ABNORMAL HIGH (ref 8–23)
CO2: 22 mmol/L (ref 22–32)
Calcium: 9 mg/dL (ref 8.9–10.3)
Chloride: 110 mmol/L (ref 98–111)
Creatinine, Ser: 1.45 mg/dL — ABNORMAL HIGH (ref 0.44–1.00)
GFR calc Af Amer: 37 mL/min — ABNORMAL LOW (ref >60)
GFR calc non Af Amer: 32 mL/min — ABNORMAL LOW (ref >60)
Glucose, Bld: 146 mg/dL — ABNORMAL HIGH (ref 70–99)
Potassium: 4.1 mmol/L (ref 3.5–5.1)
Sodium: 142 mmol/L (ref 135–145)

## 2019-04-02 LAB — GLUCOSE, CAPILLARY
Glucose-Capillary: 142 mg/dL — ABNORMAL HIGH (ref 70–99)
Glucose-Capillary: 149 mg/dL — ABNORMAL HIGH (ref 70–99)
Glucose-Capillary: 152 mg/dL — ABNORMAL HIGH (ref 70–99)
Glucose-Capillary: 153 mg/dL — ABNORMAL HIGH (ref 70–99)
Glucose-Capillary: 186 mg/dL — ABNORMAL HIGH (ref 70–99)
Glucose-Capillary: 258 mg/dL — ABNORMAL HIGH (ref 70–99)

## 2019-04-02 LAB — MAGNESIUM: Magnesium: 1.4 mg/dL — ABNORMAL LOW (ref 1.7–2.4)

## 2019-04-02 MED ORDER — ADULT MULTIVITAMIN W/MINERALS CH
1.0000 | ORAL_TABLET | Freq: Every day | ORAL | Status: DC
  Administered 2019-04-03 – 2019-04-18 (×16): 1 via ORAL
  Filled 2019-04-02 (×17): qty 1

## 2019-04-02 MED ORDER — ACETAMINOPHEN 325 MG PO TABS
325.0000 mg | ORAL_TABLET | ORAL | Status: DC | PRN
  Administered 2019-04-03 – 2019-04-14 (×2): 650 mg via ORAL
  Filled 2019-04-02 (×3): qty 2

## 2019-04-02 MED ORDER — FLEET ENEMA 7-19 GM/118ML RE ENEM: 1.0000 | ENEMA | Freq: Once | RECTAL | Status: DC | PRN

## 2019-04-02 MED ORDER — ASPIRIN 81 MG PO TBEC: 81.0000 mg | DELAYED_RELEASE_TABLET | Freq: Every day | ORAL | Status: DC

## 2019-04-02 MED ORDER — GUAIFENESIN-DM 100-10 MG/5ML PO SYRP: 5.0000 mL | ORAL_SOLUTION | Freq: Four times a day (QID) | ORAL | Status: DC | PRN

## 2019-04-02 MED ORDER — ALUM & MAG HYDROXIDE-SIMETH 200-200-20 MG/5ML PO SUSP: 30.0000 mL | ORAL | Status: DC | PRN

## 2019-04-02 MED ORDER — SENNOSIDES-DOCUSATE SODIUM 8.6-50 MG PO TABS: 1.0000 | ORAL_TABLET | Freq: Every evening | ORAL | Status: DC | PRN

## 2019-04-02 MED ORDER — ASPIRIN EC 81 MG PO TBEC
81.0000 mg | DELAYED_RELEASE_TABLET | Freq: Every day | ORAL | Status: DC
  Administered 2019-04-03 – 2019-04-18 (×16): 81 mg via ORAL
  Filled 2019-04-02 (×16): qty 1

## 2019-04-02 MED ORDER — TRAZODONE HCL 50 MG PO TABS
25.0000 mg | ORAL_TABLET | Freq: Every evening | ORAL | Status: DC | PRN
  Administered 2019-04-03 – 2019-04-06 (×3): 50 mg via ORAL
  Filled 2019-04-02 (×3): qty 1

## 2019-04-02 MED ORDER — PROCHLORPERAZINE 25 MG RE SUPP: 12.5000 mg | Freq: Four times a day (QID) | RECTAL | Status: DC | PRN

## 2019-04-02 MED ORDER — DIPHENHYDRAMINE HCL 12.5 MG/5ML PO ELIX: 12.5000 mg | ORAL_SOLUTION | Freq: Four times a day (QID) | ORAL | Status: DC | PRN

## 2019-04-02 MED ORDER — ROSUVASTATIN CALCIUM 5 MG PO TABS
10.0000 mg | ORAL_TABLET | Freq: Every day | ORAL | Status: DC
  Administered 2019-04-03 – 2019-04-18 (×16): 10 mg via ORAL
  Filled 2019-04-02 (×20): qty 2

## 2019-04-02 MED ORDER — PROCHLORPERAZINE EDISYLATE 10 MG/2ML IJ SOLN: 5.0000 mg | Freq: Four times a day (QID) | INTRAMUSCULAR | Status: DC | PRN

## 2019-04-02 MED ORDER — GLIMEPIRIDE 2 MG PO TABS
4.0000 mg | ORAL_TABLET | Freq: Every day | ORAL | Status: DC
  Administered 2019-04-03 – 2019-04-05 (×3): 4 mg via ORAL
  Filled 2019-04-02 (×3): qty 2

## 2019-04-02 MED ORDER — POLYETHYLENE GLYCOL 3350 17 G PO PACK: 17.0000 g | PACK | Freq: Every day | ORAL | Status: DC | PRN

## 2019-04-02 MED ORDER — INSULIN ASPART 100 UNIT/ML ~~LOC~~ SOLN
0.0000 [IU] | Freq: Three times a day (TID) | SUBCUTANEOUS | Status: DC
  Administered 2019-04-03: 07:00:00 1 [IU] via SUBCUTANEOUS
  Administered 2019-04-03 (×2): 2 [IU] via SUBCUTANEOUS
  Administered 2019-04-04: 18:00:00 3 [IU] via SUBCUTANEOUS
  Administered 2019-04-04: 13:00:00 2 [IU] via SUBCUTANEOUS
  Administered 2019-04-05: 06:00:00 1 [IU] via SUBCUTANEOUS
  Administered 2019-04-05 – 2019-04-06 (×3): 2 [IU] via SUBCUTANEOUS
  Administered 2019-04-06 (×2): 1 [IU] via SUBCUTANEOUS
  Administered 2019-04-07: 13:00:00 3 [IU] via SUBCUTANEOUS
  Administered 2019-04-07 (×2): 1 [IU] via SUBCUTANEOUS
  Administered 2019-04-08: 2 [IU] via SUBCUTANEOUS
  Administered 2019-04-08: 18:00:00 1 [IU] via SUBCUTANEOUS
  Administered 2019-04-09 (×2): 3 [IU] via SUBCUTANEOUS
  Administered 2019-04-10 (×2): 1 [IU] via SUBCUTANEOUS
  Administered 2019-04-11: 2 [IU] via SUBCUTANEOUS
  Administered 2019-04-11: 1 [IU] via SUBCUTANEOUS
  Administered 2019-04-12: 3 [IU] via SUBCUTANEOUS
  Administered 2019-04-12: 2 [IU] via SUBCUTANEOUS
  Administered 2019-04-13: 3 [IU] via SUBCUTANEOUS
  Administered 2019-04-13 – 2019-04-16 (×6): 2 [IU] via SUBCUTANEOUS
  Administered 2019-04-16 – 2019-04-17 (×3): 1 [IU] via SUBCUTANEOUS

## 2019-04-02 MED ORDER — PROCHLORPERAZINE MALEATE 5 MG PO TABS: 5.0000 mg | ORAL_TABLET | Freq: Four times a day (QID) | ORAL | Status: DC | PRN

## 2019-04-02 MED ORDER — MULTIVITAMINS PO CAPS: 1.0000 | ORAL_CAPSULE | Freq: Every day | ORAL | Status: DC

## 2019-04-02 MED ORDER — BISACODYL 10 MG RE SUPP: 10.0000 mg | Freq: Every day | RECTAL | Status: DC | PRN

## 2019-04-02 MED ORDER — LEVOTHYROXINE SODIUM 50 MCG PO TABS
50.0000 ug | ORAL_TABLET | Freq: Every day | ORAL | Status: DC
  Administered 2019-04-03 – 2019-04-18 (×15): 50 ug via ORAL
  Filled 2019-04-02 (×15): qty 1

## 2019-04-02 MED ORDER — INSULIN ASPART 100 UNIT/ML ~~LOC~~ SOLN
0.0000 [IU] | Freq: Every day | SUBCUTANEOUS | Status: DC
  Administered 2019-04-06: 22:00:00 5 [IU] via SUBCUTANEOUS
  Administered 2019-04-11 – 2019-04-13 (×3): 2 [IU] via SUBCUTANEOUS

## 2019-04-02 MED ORDER — METFORMIN HCL 500 MG PO TABS
250.0000 mg | ORAL_TABLET | Freq: Two times a day (BID) | ORAL | Status: DC
  Administered 2019-04-03 – 2019-04-08 (×11): 250 mg via ORAL
  Filled 2019-04-02 (×12): qty 1

## 2019-04-02 NOTE — Evaluation (Signed)
Occupational Therapy Assessment and Plan  Patient Details  Name: Stacey Harris MRN: [id number] Date of Birth: 1929-06-25  OT Diagnosis: hemiplegia affecting non-dominant side and muscle weakness (generalized) Rehab Potential: Rehab Potential (ACUTE ONLY): Good ELOS: 14/18 days   Today's Date: 04/03/2019 OT Individual Time: 1300-1415 OT Individual Time Calculation (min): 75 min     Problem List:  Patient Active Problem List   Diagnosis Date Noted  . Right thalamic stroke ([place]) 04/02/2019  . Stroke of right basal ganglia ([place]) 04/02/2019  . Basal ganglia stroke ([place])   . AKI (acute kidney injury) ([place])   . Hypomagnesemia   . Uncontrolled type 2 diabetes mellitus with hyperglycemia ([place])   . Stroke ([place]) 03/30/2019  . CKD (chronic kidney disease) 03/30/2019  . Recurrent UTI (urinary tract infection) 09/17/2018  . Fall at home 09/17/2018  . Frequent urination 04/29/2018  . Elevated serum creatinine 04/11/2018  . Dysuria 11/12/2017  . SOB (shortness of breath) on exertion 11/12/2017  . History of fall 08/27/2016  . Controlled type 2 diabetes mellitus with diabetic autonomic neuropathy ([place]) 02/24/2016  . Routine general medical examination at a health care facility 02/12/2016  . MDS (myelodysplastic syndrome) ([place]) 10/24/2015  . Normocytic anemia 02/11/2015  . GERD (gastroesophageal reflux disease) 08/06/2014  . Encounter for Medicare annual wellness exam 08/03/2013  . Thrombocytopenia ([place]) 08/03/2013  . Insomnia 01/26/2013  . Hypothyroid 09/17/2011  . Lumbar spinal stenosis 06/21/2011    Class: Present on Admission  . BACK PAIN, LUMBAR 08/23/2010  . POSTNASAL DRIP 08/23/2010  . OSTEOARTHRITIS, HIP 03/16/2009  . BASAL CELL CARCINOMA, FACE 07/01/2007  . BASAL CELL CARCINOMA, FACE 07/01/2007  . DIABETIC PERIPHERAL NEUROPATHY 09/24/2006  . Hyperlipidemia 09/24/2006  . Essential hypertension 09/24/2006  . ROSACEA 09/24/2006  . INCONTINENCE, URGE 09/24/2006  . COLONOSCOPY  AND REMOVAL OF LESION, HX OF 06/09/2003    Past Medical History:  Past Medical History:  Diagnosis Date  . Blood transfusion   . Cancer ([place])    basal cell skin CA  . Diabetes mellitus    type II  . Frequent UTI   . Hyperlipidemia   . Hypertension   . Hypothyroidism   . Macular degeneration of both eyes   . OA (osteoarthritis)    Past Surgical History:  Past Surgical History:  Procedure Laterality Date  . ANKLE FRACTURE SURGERY  10   rt  . CARPAL TUNNEL RELEASE Right 06/06/2017   Procedure: RIGHT CARPAL TUNNEL RELEASE;  Surgeon: Daryll Brod, MD;  Location: [place] [facility];  Service: Orthopedics;  Laterality: Right;  . CATARACT EXTRACTION W/ INTRAOCULAR LENS  IMPLANT, BILATERAL Bilateral 2019  . JOINT REPLACEMENT Right 2010   rt total hip 10  . LUMBAR LAMINECTOMY/DECOMPRESSION MICRODISCECTOMY  06/22/2011   Procedure: LUMBAR LAMINECTOMY/DECOMPRESSION MICRODISCECTOMY;  Surgeon: Marybelle Killings;  Location: [facility];  Service: Orthopedics;  Laterality: N/A;  L3-4, L4-5 Decompression  . MOHS SURGERY  12/08   basal cell skin cancer lt temple  . TAH      and BSO, age 22  . TONSILLECTOMY      Assessment & Plan Clinical Impression: Patient is a 85 y.o. year old female with recent admission to the hospital on 03/30/2019 with slurred speech and left-sided weakness. History taken from chart review due to cognition. At baseline patient needed some assistance with walking and was unable to complete ALDs without assistance. Last seen normal night before. CT head done revealing age indeterminate right BG, internal capsule and corona radiata infarct  with moderate advanced generalized atrophy. MRI brain and MRA brain/neck limited, but showed early subacute right basal ganglia infarct with occlusion/high-grade stenosis. Echocardiogram showed ejection fraction of 60-65% with mildly calcified mitral valve. Patient transferred to CIR on 04/02/2019 .    Patient currently requires Mod/ max with  basic self-care skills secondary to muscle weakness, decreased initiation, decreased problem solving, decreased safety awareness, decreased memory and delayed processing and decreased sitting balance, decreased standing balance, decreased postural control, hemiplegia and decreased balance strategies.  Prior to hospitalization, patient could complete BADL with supervision.  Patient will benefit from skilled intervention to increase independence with basic self-care skills prior to discharge home with care partner.  Anticipate patient will require 24 hour supervision and follow up home health.  OT - End of Session Endurance Deficit: Yes Endurance Deficit Description: Rest breaks within BADL tasks OT Assessment Rehab Potential (ACUTE ONLY): Good OT Patient demonstrates impairments in the following area(s): Balance;Cognition;Endurance;Motor;Perception;Safety OT Basic ADL's Functional Problem(s): Eating;Grooming;Bathing;Dressing;Toileting OT Transfers Functional Problem(s): Toilet;Tub/Shower OT Additional Impairment(s): Fuctional Use of Upper Extremity OT Plan OT Intensity: Minimum of 1-2 x/day, 45 to 90 minutes OT Frequency: 5 out of 7 days OT Duration/Estimated Length of Stay: 14/18 days OT Treatment/Interventions: Balance/vestibular training;Cognitive remediation/compensation;Community reintegration;Discharge planning;Disease mangement/prevention;DME/adaptive equipment instruction;Functional mobility training;Neuromuscular re-education;Patient/family education;Pain management;Self Care/advanced ADL retraining;Splinting/orthotics;Therapeutic Activities;Therapeutic Exercise;UE/LE Strength taining/ROM;UE/LE Coordination activities;Wheelchair propulsion/positioning;Visual/perceptual remediation/compensation OT Self Feeding Anticipated Outcome(s): Supervision OT Basic Self-Care Anticipated Outcome(s): Supervision OT Toileting Anticipated Outcome(s): Supervision OT Bathroom Transfers Anticipated  Outcome(s): Supervision OT Recommendation Patient destination: Home Follow Up Recommendations: Home health OT Equipment Recommended: To be determined   Skilled Therapeutic Intervention Pt greeted sitting in wc eating lunch, handoff to OT from Nurse Tech. Pt needed min verbal cues throughout meal to slow down prior to taking next bite.  Pt brought to the sink in wc for bathing/dressing tasks. Pt needed min A for UB bathing and mod A for UB dressing 2/2 L hemiplegia. Pt needed multiple trials to achieve sit<>stand at the sink, but eventually able to power up with mod/max A. OT provided pt with was cloth and she was able to wash peri-area and buttocks with OT providing mod A for dynamic balance. Max A for LB dressing today and similar assistance for sit<>stand to pull pants up. Discussed OT purpose, POC, estimated LOS, and OT goals with pt. OT issued wc cushion for improved comfort in sitting. Pt's daughter entered the room and further discussed pt's PLOF of mostly supervision for BADL tasks. Pt's daughter reports some baseline memory issues. Pt agreeable to stay up in wc and left with chair alarm on and call bell in reach.   OT Evaluation Precautions/Restrictions  Precautions Precautions: Fall Restrictions Weight Bearing Restrictions: No Pain  denies pain Home Living/Prior Functioning Home Living Family/patient expects to be discharged to:: Private residence Living Arrangements: Children Available Help at Discharge: Available 24 hours/day, Family Type of Home: House Home Access: Level entry Entrance Stairs-Rails: (per chart review, no rails, pt unable to state if that is the case or not) Home Layout: Able to live on main level with bedroom/bathroom, Two level Bathroom Shower/Tub: Chiropodist: Standard Additional Comments: Per pt, has a bench in the shower  Lives With: Daughter IADL History Homemaking Responsibilities: No Leisure and Hobbies: Used to enjoy making  pillows with her church to send to hospital patients Prior Function Level of Independence: Needs assistance with homemaking, Requires assistive device for independence, Independent with basic ADLs Driving: No Vocation: Retired(used to teach school) Comments:  Daughter did cooking and cleaning, pt reports she did bathing and dressing herself.  ADL ADL Eating: Minimal assistance Grooming: Minimal assistance Upper Body Bathing: Minimal assistance Lower Body Bathing: Moderate assistance Upper Body Dressing: Minimal assistance Lower Body Dressing: Maximal assistance Toileting: Moderate assistance Toilet Transfer: Maximal assistance Tub/Shower Transfer: Maximal assistance Vision Patient Visual Report: No change from baseline Cognition Overall Cognitive Status: Impaired/Different from baseline Arousal/Alertness: Awake/alert Orientation Level: Person;Place;Situation Person: Oriented Place: Disoriented Situation: Oriented Year: Other (Comment)(1920) Month: September Day of Week: Incorrect Memory: Impaired Memory Impairment: Retrieval deficit;Decreased recall of new information;Storage deficit Immediate Memory Recall: Sock;Blue;Bed Memory Recall Sock: Without Cue Memory Recall Blue: Without Cue Memory Recall Bed: Without Cue Focused Attention: Appears intact Sustained Attention: Appears intact Awareness: Impaired Awareness Impairment: Intellectual impairment Problem Solving: Impaired Problem Solving Impairment: Functional basic Safety/Judgment: Impaired Sensation Sensation Light Touch: Appears Intact Coordination Gross Motor Movements are Fluid and Coordinated: No Fine Motor Movements are Fluid and Coordinated: No Coordination and Movement Description: decreased smoothness and accuracy  Motor  Motor Motor: Hemiplegia Motor - Skilled Clinical Observations: L hemi Mobility  Transfers Sit to Stand: Maximal Assistance - Patient 25-49% Stand to Sit: Maximal Assistance -  Patient 25-49%  Balance Balance Balance Assessed: Yes Static Sitting Balance Static Sitting - Level of Assistance: 5: Stand by assistance Dynamic Sitting Balance Dynamic Sitting - Level of Assistance: 4: Min assist Static Standing Balance Static Standing - Level of Assistance: 4: Min assist Dynamic Standing Balance Dynamic Standing - Level of Assistance: 2: Max assist Extremity/Trunk Assessment RUE Assessment RUE Assessment: Within Functional Limits General Strength Comments: generalized weakness, but WFL LUE Assessment LUE Assessment: Exceptions to [facility] LUE Body System: Neuro Brunstrum levels for arm and hand: Arm;Hand Brunstrum level for arm: Stage V Relative Independence from Synergy Brunstrum level for hand: Stage VI Isolated joint movements LUE Strength LUE Overall Strength: Deficits LUE Overall Strength Comments: 3-5 overall- proximal shoulder weakness Left Shoulder Flexion: 3-/5     Refer to Care Plan for Long Term Goals  Recommendations for other services: None    Discharge Criteria: Patient will be discharged from OT if patient refuses treatment 3 consecutive times without medical reason, if treatment goals not met, if there is a change in medical status, if patient makes no progress towards goals or if patient is discharged from hospital.  The above assessment, treatment plan, treatment alternatives and goals were discussed and mutually agreed upon: by patient  Valma Cava 04/03/2019, 3:48 PM
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
## 2019-04-02 NOTE — Progress Notes (Signed)
Pt admitted to unit at 1705 with daughter at bedside. RN educated pt and daughter on rehab process, safety plan, and anticipation of IP rehab. Bed alarm in place, SR up and call bell within reach.

## 2019-04-02 NOTE — Progress Notes (Signed)
Physical Therapy Treatment Patient Details Name: Stacey Harris MRN: [id number] DOB: [birth year] Today's Date: 04/02/2019    History of Present Illness Stacey Harris is a 85 y.o. female with medical history significant of hypertension, hyperlipidemia, diabetes mellitus, chronic kidney disease stage III, myelodysplastic syndrome, chronic thrombocytopenia, macrocytic anemia, hypothyroidism, arthritis brought by EMS to emergency department with slurred speech and L sided weakness; MRI reveals acute to early subacute ischemic infarct Rt Basal ganglia     PT Comments    Patient seen for mobility progression. Pt is oriented to self and able to state she is in the hospital. Pt requires +2 assist for functional transfer training. Current plan remains appropriate.    Follow Up Recommendations  CIR     Equipment Recommendations  3in1 (PT)    Recommendations for Other Services       Precautions / Restrictions Precautions Precautions: Fall Restrictions Weight Bearing Restrictions: No    Mobility  Bed Mobility Overal bed mobility: Needs Assistance Bed Mobility: Supine to Sit     Supine to sit: Min assist     General bed mobility comments: cues for sequencing; use of rail; assist to scoot L hip to EOB and to steady trunk while coming to EOB  Transfers Overall transfer level: Needs assistance Equipment used: Rolling walker (2 wheeled) Transfers: Sit to/from [facility] Sit to Stand: Mod assist;+2 safety/equipment Stand pivot transfers: Mod assist;+2 physical assistance       General transfer comment: assist to power up into standing and for balance, weight shifting, and guiding RW while stepping to recliner   Ambulation/Gait                 Stairs             Wheelchair Mobility    Modified Rankin (Stroke Patients Only) Modified Rankin (Stroke Patients Only) Pre-Morbid Rankin Score: No significant disability Modified Rankin: Severe  disability     Balance Overall balance assessment: Needs assistance Sitting-balance support: Feet supported;Bilateral upper extremity supported Sitting balance-Leahy Scale: Fair       Standing balance-Leahy Scale: Poor                              Cognition Arousal/Alertness: Awake/alert Behavior During Therapy: WFL for tasks assessed/performed Overall Cognitive Status: Impaired/Different from baseline Area of Impairment: Attention;Problem solving;Following commands;Orientation                 Orientation Level: Disoriented to;Place;Time;Situation Current Attention Level: Sustained   Following Commands: Follows one step commands with increased time     Problem Solving: Slow processing;Decreased initiation;Difficulty sequencing;Requires verbal cues;Requires tactile cues        Exercises      General Comments        Pertinent Vitals/Pain Pain Assessment: No/denies pain    Home Living                      Prior Function            PT Goals (current goals can now be found in the care plan section) Acute Rehab PT Goals Patient Stated Goal: to go home Progress towards PT goals: Progressing toward goals    Frequency    Min 4X/week      PT Plan Current plan remains appropriate    Co-evaluation              AM-PAC PT "6 Clicks" Mobility  Outcome Measure  Help needed turning from your back to your side while in a flat bed without using bedrails?: A Little Help needed moving from lying on your back to sitting on the side of a flat bed without using bedrails?: A Lot Help needed moving to and from a bed to a chair (including a wheelchair)?: A Lot Help needed standing up from a chair using your arms (e.g., wheelchair or bedside chair)?: A Lot Help needed to walk in hospital room?: A Lot Help needed climbing 3-5 steps with a railing? : Total 6 Click Score: 12    End of Session Equipment Utilized During Treatment: Gait  belt Activity Tolerance: Patient tolerated treatment well Patient left: in chair;with call bell/phone within reach;with chair alarm set Nurse Communication: Mobility status PT Visit Diagnosis: Hemiplegia and hemiparesis;Other symptoms and signs involving the nervous system (R29.898) Hemiplegia - Right/Left: Left Hemiplegia - dominant/non-dominant: Non-dominant Hemiplegia - caused by: Cerebral infarction     Time: [id number] PT Time Calculation (min) (ACUTE ONLY): 22 min  Charges:  $Gait Training: 8-22 mins                     Earney Navy, PTA Acute Rehabilitation Services Pager: [phone] Office: [phone]     Darliss Cheney 04/02/2019, 2:13 PM

## 2019-04-02 NOTE — Progress Notes (Signed)
Inpatient Rehab Admissions Coordinator:   I have insurance authorization and approval from Dr. Starla Link for pt to admit to CIR today.  I will let pt/family, RN, and CM know.   Shann Medal, PT, DPT Admissions Coordinator [phone] 04/02/19  10:30 AM

## 2019-04-02 NOTE — Progress Notes (Signed)
Pt transfer to CIR, no new complain or concerns, pt is on room air and stable. transfer report given to [place].
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
## 2019-04-02 NOTE — Discharge Summary (Signed)
Physician Discharge Summary  Stacey Harris TAV:[id number] DOB: Dec 21, 1928 DOA: 03/30/2019  PCP: [name], MD  Admit date: 03/30/2019 Discharge date: 04/02/2019  Admitted From: Home Disposition: CIR  Recommendations for Outpatient Follow-up:  1. Follow up with CIR provider on discharge 2. Outpatient follow-up with neurology 3. Follow up in ED if symptoms worsen or new appear   Home Health: No Equipment/Devices: None  Discharge Condition: Guarded CODE STATUS: DNR Diet recommendation: Heart healthy/carb modified/as per SLP recommendations  Brief/Interim Summary: 85 year old female with history of hypertension, hyperlipidemia, type 2 diabetes mellitus on oral hypoglycemics, CKD stage III, myelodysplastic syndrome, chronic thrombocytopenia, microcytic anemia, hypothyroidism, arthritis presented on 03/30/2019 with concern for stroke as patient had trouble speaking with left-sided weakness.  CT of the brain showed right thalamic stroke; patient was outside TPA window.  Neurology was consulted.  PT recommended CIR placement.  She will be discharged to CIR once bed is available.  Discharge Diagnoses:   Acute ischemic stroke presenting with dysarthria with left hemiparesis -CT head showed right basal ganglia, internal capsule and corona radiata acute stroke -MRI of the brain showed subacute ischemic infarct of the right basal ganglia.  No hemorrhage -MRA of the head and neck showed no large vessel occlusion and no high-grade stenosis -2D echo showed EF of 60 to 65%.  No source of embolism was identified -LDL 55.  Continue statin -Continue low-dose aspirin 81 mg daily because of thrombocytopenia and easy bruising -Hemoglobin A1c 8.5. -Neurology has signed off and recommend outpatient follow-up with neurology -PT/OT recommend CIR placement.  She will be discharged to CIR once bed is available.   Hypertension  -Monitor blood pressure.  Allow permissive hypertension.  Antihypertensives  can be resumed in CIR.  Diabetes mellitus type 2 uncontrolled with hyperglycemia -A1c 8.5.  On Amaryl and metformin at home.  Continue glimepiride.  Metformin on hold for now.  Myelodysplastic syndrome and chronic thrombocytopenia -Stable.  Outpatient follow-up  Acute kidney injury on chronic kidney disease stage III -Monitor renal function intermittently.  Discharge Instructions  Discharge Instructions    Ambulatory referral to Neurology   Complete by: As directed    An appointment is requested in approximately: 4 weeks   Diet - low sodium heart healthy   Complete by: As directed    Diet Carb Modified   Complete by: As directed    Increase activity slowly   Complete by: As directed      Allergies as of 04/02/2019      Reactions   Keflex [cephalexin]    Did ok with lower dose, higher dose caused a rash   Mavik [trandolapril]    Cough       Medication List    STOP taking these medications   diazepam 5 MG tablet Commonly known as: VALIUM   hydrochlorothiazide 25 MG tablet Commonly known as: HYDRODIURIL   losartan 25 MG tablet Commonly known as: COZAAR   metFORMIN 1000 MG tablet Commonly known as: GLUCOPHAGE   metoprolol succinate 100 MG 24 hr tablet Commonly known as: TOPROL-XL     TAKE these medications   Accu-Chek Aviva Plus w/Device Kit Check blood sugar once daily and as directed.Dx E11.43   Accu-Chek SmartView Control Liqd   aspirin 81 MG EC tablet Take 1 tablet (81 mg total) by mouth daily. Start taking on: April 03, 2019   CRANBERRY PO Take 2 tablets by mouth daily.   Fish Oil 1200 MG Caps Take 1,200 mg by mouth daily.   glimepiride  4 MG tablet Commonly known as: AMARYL TAKE 1 TABLET EVERY DAY BEFORE BREAKFAST What changed:   how much to take  how to take this  when to take this  additional instructions   glucose blood test strip Commonly known as: Accu-Chek Aviva Plus Check blood sugar once daily and as directed.Dx E11.43    levothyroxine 50 MCG tablet Commonly known as: SYNTHROID Take 1 tablet (50 mcg total) by mouth daily.   multivitamin capsule Take 1 capsule by mouth daily.   rosuvastatin 10 MG tablet Commonly known as: Crestor Take 1 tablet (10 mg total) by mouth daily.   senna-docusate 8.6-50 MG tablet Commonly known as: Senokot-S Take 1 tablet by mouth at bedtime as needed for moderate constipation.   VITAMIN D PO Take 5,000 Units by mouth daily.      Follow-up Information    Penumalli, Earlean Polka, MD. Schedule an appointment as soon as possible for a visit in 4 week(s).   Specialties: Neurology, Radiology Contact information: [address] [place] [address] [phone]        [name], MD. Schedule an appointment as soon as possible for a visit in 1 week(s).   Specialties: Family Medicine, Radiology Contact information: [place] [address] [phone]          Allergies  Allergen Reactions  . Keflex [Cephalexin]     Did ok with lower dose, higher dose caused a rash    . Mavik [Trandolapril]     Cough     Consultations:  Neurology   Procedures/Studies: Mr Angio Head Wo Contrast  Result Date: 03/30/2019 CLINICAL DATA:  Follow-up examination for acute stroke. EXAM: MRI HEAD WITHOUT CONTRAST MRA HEAD WITHOUT CONTRAST MRA NECK WITHOUT CONTRAST TECHNIQUE: Multiplanar, multiecho pulse sequences of the brain and surrounding structures were obtained without intravenous contrast. Angiographic images of the Circle of Willis were obtained using MRA technique without intravenous contrast. Angiographic images of the neck were obtained using MRA technique without intravenous contrast. Carotid stenosis measurements (when applicable) are obtained utilizing NASCET criteria, using the distal internal carotid diameter as the denominator. COMPARISON:  Prior CT from earlier the same day. FINDINGS: MRI HEAD FINDINGS Brain: Examination  moderately degraded by motion artifact as well as the patient's inability to tolerate the full length of the exam. Age-related cerebral atrophy with mild chronic small vessel ischemic disease. Approximate 2.8 cm focus of restricted diffusion seen extending from the right lentiform nucleus into the right internal capsule and right caudate, consistent with acute to early subacute ischemic infarct (series 7, image 50). This corresponds with hyperdensity seen on prior CT. No associated hemorrhage or mass effect. No other evidence for acute or subacute ischemia. Gray-white matter differentiation otherwise maintained. No encephalomalacia to suggest chronic cortical infarction. No foci of susceptibility artifact to suggest acute or chronic intracranial hemorrhage. No mass lesion, midline shift or mass effect. No hydrocephalus. No extra-axial fluid collection. Vascular: Major intracranial vascular flow voids are grossly maintained at the skull base. Skull and upper cervical spine: Grossly unremarkable on this limited exam. Sinuses/Orbits: Patient status post bilateral ocular lens replacement. Paranasal sinuses are largely clear. Trace left mastoid effusion, of doubtful significance. Other: None. MRA HEAD FINDINGS ANTERIOR CIRCULATION: Examination severely degraded by motion artifact. Distal cervical segments of the internal carotid arteries are grossly patent bilaterally. Petrous, cervical, and cavernous ICAs patent to the termini without obvious high-grade stenosis, although evaluation fairly limited. Partially visualized A1 segments grossly patent. Anterior communicating  artery not well assessed due to motion. Visualized anterior cerebral arteries patent to their distal aspects without obvious stenosis. M1 segments patent bilaterally without obvious stenosis. Evaluation of the MCA bifurcations limited by motion. Visualized distal MCA branches grossly perfused and symmetric, although poorly assessed on this limited exam.  POSTERIOR CIRCULATION: Partially visualized distal V4 segments grossly patent and symmetric bilaterally. Posterior inferior cerebral arteries not seen. Visualized basilar patent to its distal aspect without obvious stenosis. Superior cerebral arteries grossly patent at their origins. Both of the PCAs appear to be primarily supplied via the basilar. PCAs grossly patent to their distal P2/P3 segments, not well assessed distally due to motion. No obvious intracranial aneurysm. MRA NECK FINDINGS Examination severely degraded by motion artifact and lack of IV contrast. Partially visualized common carotid arteries grossly patent and symmetric bilaterally. Carotid bifurcations poorly assessed and not well seen due to extensive motion artifact. Partially visualized internal carotid arteries patent with antegrade flow. No appreciable stenosis or vascular occlusion. Origin of the vertebral arteries not assessed on this exam. Partially visualized vertebral arteries grossly patent with antegrade flow within the neck. No obvious stenosis or vascular occlusion. IMPRESSION: MRI HEAD IMPRESSION: 1. Technically limited exam due to extensive motion artifact and patient's inability to tolerate the full length of the study. 2. Approximate 2.8 cm acute to early subacute ischemic infarct involving the right basal ganglia as above. No associated hemorrhage or mass effect. 3. Underlying age-related cerebral atrophy with mild chronic small vessel ischemic disease. MRA HEAD IMPRESSION: 1. Severely limited exam due to extensive motion artifact. 2. Gross patency of the major arterial intracranial vasculature. No obvious vascular occlusion or focal high-grade stenosis. MRA NECK IMPRESSION: 1. Severely limited exam due to extensive motion artifact. 2. Gross patency of the major arterial vasculature within the neck. No vascular occlusion or obvious flow-limiting stenosis. Electronically Signed   By: Jeannine Boga M.D.   On: 03/30/2019  21:32   Mr Angio Neck Wo Contrast  Result Date: 03/30/2019 CLINICAL DATA:  Follow-up examination for acute stroke. EXAM: MRI HEAD WITHOUT CONTRAST MRA HEAD WITHOUT CONTRAST MRA NECK WITHOUT CONTRAST TECHNIQUE: Multiplanar, multiecho pulse sequences of the brain and surrounding structures were obtained without intravenous contrast. Angiographic images of the Circle of Willis were obtained using MRA technique without intravenous contrast. Angiographic images of the neck were obtained using MRA technique without intravenous contrast. Carotid stenosis measurements (when applicable) are obtained utilizing NASCET criteria, using the distal internal carotid diameter as the denominator. COMPARISON:  Prior CT from earlier the same day. FINDINGS: MRI HEAD FINDINGS Brain: Examination moderately degraded by motion artifact as well as the patient's inability to tolerate the full length of the exam. Age-related cerebral atrophy with mild chronic small vessel ischemic disease. Approximate 2.8 cm focus of restricted diffusion seen extending from the right lentiform nucleus into the right internal capsule and right caudate, consistent with acute to early subacute ischemic infarct (series 7, image 50). This corresponds with hyperdensity seen on prior CT. No associated hemorrhage or mass effect. No other evidence for acute or subacute ischemia. Gray-white matter differentiation otherwise maintained. No encephalomalacia to suggest chronic cortical infarction. No foci of susceptibility artifact to suggest acute or chronic intracranial hemorrhage. No mass lesion, midline shift or mass effect. No hydrocephalus. No extra-axial fluid collection. Vascular: Major intracranial vascular flow voids are grossly maintained at the skull base. Skull and upper cervical spine: Grossly unremarkable on this limited exam. Sinuses/Orbits: Patient status post bilateral ocular lens replacement. Paranasal sinuses are largely clear.  Trace left mastoid  effusion, of doubtful significance. Other: None. MRA HEAD FINDINGS ANTERIOR CIRCULATION: Examination severely degraded by motion artifact. Distal cervical segments of the internal carotid arteries are grossly patent bilaterally. Petrous, cervical, and cavernous ICAs patent to the termini without obvious high-grade stenosis, although evaluation fairly limited. Partially visualized A1 segments grossly patent. Anterior communicating artery not well assessed due to motion. Visualized anterior cerebral arteries patent to their distal aspects without obvious stenosis. M1 segments patent bilaterally without obvious stenosis. Evaluation of the MCA bifurcations limited by motion. Visualized distal MCA branches grossly perfused and symmetric, although poorly assessed on this limited exam. POSTERIOR CIRCULATION: Partially visualized distal V4 segments grossly patent and symmetric bilaterally. Posterior inferior cerebral arteries not seen. Visualized basilar patent to its distal aspect without obvious stenosis. Superior cerebral arteries grossly patent at their origins. Both of the PCAs appear to be primarily supplied via the basilar. PCAs grossly patent to their distal P2/P3 segments, not well assessed distally due to motion. No obvious intracranial aneurysm. MRA NECK FINDINGS Examination severely degraded by motion artifact and lack of IV contrast. Partially visualized common carotid arteries grossly patent and symmetric bilaterally. Carotid bifurcations poorly assessed and not well seen due to extensive motion artifact. Partially visualized internal carotid arteries patent with antegrade flow. No appreciable stenosis or vascular occlusion. Origin of the vertebral arteries not assessed on this exam. Partially visualized vertebral arteries grossly patent with antegrade flow within the neck. No obvious stenosis or vascular occlusion. IMPRESSION: MRI HEAD IMPRESSION: 1. Technically limited exam due to extensive motion artifact  and patient's inability to tolerate the full length of the study. 2. Approximate 2.8 cm acute to early subacute ischemic infarct involving the right basal ganglia as above. No associated hemorrhage or mass effect. 3. Underlying age-related cerebral atrophy with mild chronic small vessel ischemic disease. MRA HEAD IMPRESSION: 1. Severely limited exam due to extensive motion artifact. 2. Gross patency of the major arterial intracranial vasculature. No obvious vascular occlusion or focal high-grade stenosis. MRA NECK IMPRESSION: 1. Severely limited exam due to extensive motion artifact. 2. Gross patency of the major arterial vasculature within the neck. No vascular occlusion or obvious flow-limiting stenosis. Electronically Signed   By: Jeannine Boga M.D.   On: 03/30/2019 21:32   Mr Brain Wo Contrast  Result Date: 03/30/2019 CLINICAL DATA:  Follow-up examination for acute stroke. EXAM: MRI HEAD WITHOUT CONTRAST MRA HEAD WITHOUT CONTRAST MRA NECK WITHOUT CONTRAST TECHNIQUE: Multiplanar, multiecho pulse sequences of the brain and surrounding structures were obtained without intravenous contrast. Angiographic images of the Circle of Willis were obtained using MRA technique without intravenous contrast. Angiographic images of the neck were obtained using MRA technique without intravenous contrast. Carotid stenosis measurements (when applicable) are obtained utilizing NASCET criteria, using the distal internal carotid diameter as the denominator. COMPARISON:  Prior CT from earlier the same day. FINDINGS: MRI HEAD FINDINGS Brain: Examination moderately degraded by motion artifact as well as the patient's inability to tolerate the full length of the exam. Age-related cerebral atrophy with mild chronic small vessel ischemic disease. Approximate 2.8 cm focus of restricted diffusion seen extending from the right lentiform nucleus into the right internal capsule and right caudate, consistent with acute to early  subacute ischemic infarct (series 7, image 50). This corresponds with hyperdensity seen on prior CT. No associated hemorrhage or mass effect. No other evidence for acute or subacute ischemia. Gray-white matter differentiation otherwise maintained. No encephalomalacia to suggest chronic cortical infarction. No foci of susceptibility artifact to  suggest acute or chronic intracranial hemorrhage. No mass lesion, midline shift or mass effect. No hydrocephalus. No extra-axial fluid collection. Vascular: Major intracranial vascular flow voids are grossly maintained at the skull base. Skull and upper cervical spine: Grossly unremarkable on this limited exam. Sinuses/Orbits: Patient status post bilateral ocular lens replacement. Paranasal sinuses are largely clear. Trace left mastoid effusion, of doubtful significance. Other: None. MRA HEAD FINDINGS ANTERIOR CIRCULATION: Examination severely degraded by motion artifact. Distal cervical segments of the internal carotid arteries are grossly patent bilaterally. Petrous, cervical, and cavernous ICAs patent to the termini without obvious high-grade stenosis, although evaluation fairly limited. Partially visualized A1 segments grossly patent. Anterior communicating artery not well assessed due to motion. Visualized anterior cerebral arteries patent to their distal aspects without obvious stenosis. M1 segments patent bilaterally without obvious stenosis. Evaluation of the MCA bifurcations limited by motion. Visualized distal MCA branches grossly perfused and symmetric, although poorly assessed on this limited exam. POSTERIOR CIRCULATION: Partially visualized distal V4 segments grossly patent and symmetric bilaterally. Posterior inferior cerebral arteries not seen. Visualized basilar patent to its distal aspect without obvious stenosis. Superior cerebral arteries grossly patent at their origins. Both of the PCAs appear to be primarily supplied via the basilar. PCAs grossly patent to  their distal P2/P3 segments, not well assessed distally due to motion. No obvious intracranial aneurysm. MRA NECK FINDINGS Examination severely degraded by motion artifact and lack of IV contrast. Partially visualized common carotid arteries grossly patent and symmetric bilaterally. Carotid bifurcations poorly assessed and not well seen due to extensive motion artifact. Partially visualized internal carotid arteries patent with antegrade flow. No appreciable stenosis or vascular occlusion. Origin of the vertebral arteries not assessed on this exam. Partially visualized vertebral arteries grossly patent with antegrade flow within the neck. No obvious stenosis or vascular occlusion. IMPRESSION: MRI HEAD IMPRESSION: 1. Technically limited exam due to extensive motion artifact and patient's inability to tolerate the full length of the study. 2. Approximate 2.8 cm acute to early subacute ischemic infarct involving the right basal ganglia as above. No associated hemorrhage or mass effect. 3. Underlying age-related cerebral atrophy with mild chronic small vessel ischemic disease. MRA HEAD IMPRESSION: 1. Severely limited exam due to extensive motion artifact. 2. Gross patency of the major arterial intracranial vasculature. No obvious vascular occlusion or focal high-grade stenosis. MRA NECK IMPRESSION: 1. Severely limited exam due to extensive motion artifact. 2. Gross patency of the major arterial vasculature within the neck. No vascular occlusion or obvious flow-limiting stenosis. Electronically Signed   By: Jeannine Boga M.D.   On: 03/30/2019 21:32   Ct Head Code Stroke Wo Contrast  Result Date: 03/30/2019 CLINICAL DATA:  Code stroke.  Acute onset left-sided weakness. EXAM: CT HEAD WITHOUT CONTRAST TECHNIQUE: Contiguous axial images were obtained from the base of the skull through the vertex without intravenous contrast. COMPARISON:  None. FINDINGS: Brain: An ill-defined area of hypoattenuation is present  within the right lentiform nucleus. This extends superiorly to involve the internal capsule and corona radiata. No other acute cortical right-sided infarcts are present. Moderate generalized atrophy and diffuse white matter disease is present bilaterally. Remote lacunar infarcts are present in the thalami bilaterally. White matter changes extend into the brainstem. The cerebellum is unremarkable. The ventricles are proportionate to the degree of atrophy. No significant extraaxial fluid collection is present. Vascular: Atherosclerotic calcifications are present within the cavernous internal carotid arteries bilaterally. There is no asymmetric hyperdense vessel. Skull: Calvarium is intact. No focal lytic or blastic lesions  are present. Sinuses/Orbits: The paranasal sinuses and mastoid air cells are clear. Bilateral lens replacements are noted. Globes and orbits are otherwise unremarkable. ASPECTS [facility] Stroke Program Early CT Score) - Ganglionic level infarction (caudate, lentiform nuclei, internal capsule, insula, M1-M3 cortex): 5/7 - Supraganglionic infarction (M4-M6 cortex): 3/3 Total score (0-10 with 10 being normal): 8/10 IMPRESSION: 1. Age indeterminate infarct involving the right basal ganglia, internal capsule, and corona radiata. This may be acute. 2. Moderate advanced generalized atrophy and white matter disease likely reflects the sequela of chronic microvascular ischemia. 3. Atherosclerosis 4. ASPECTS is 8/10 The above was relayed via text pager to Dr. Cheral Marker on 03/30/2019 at 11:58 AM. Electronically Signed   By: San Morelle M.D.   On: 03/30/2019 12:06     Echo IMPRESSIONS   1. Left ventricular ejection fraction, by visual estimation, is 60 to 65%. The left ventricle has normal function. Normal left ventricular size. There is no left ventricular hypertrophy. 2. Global right ventricle has normal systolic function.The right ventricular size is normal. No increase in right ventricular  wall thickness. 3. Left atrial size was mildly dilated. 4. Right atrial size was normal. 5. Moderate mitral annular calcification. 6. The mitral valve is normal in structure. Mild to moderate mitral valve regurgitation. No evidence of mitral stenosis. 7. The tricuspid valve is normal in structure. Tricuspid valve regurgitation is mild. 8. The aortic valve is normal in structure. Aortic valve regurgitation is trivial by color flow Doppler. Structurally normal aortic valve, with no evidence of sclerosis or stenosis. 9. The pulmonic valve was normal in structure. Pulmonic valve regurgitation is trivial by color flow Doppler. 10. Normal pulmonary artery systolic pressure. 11. The inferior vena cava is normal in size with greater than 50% respiratory variability, suggesting right atrial pressure of 3 mmHg. 12. No embolic source identified.  Subjective: Patient seen and examined at bedside.  She is sleepy, wakes up slightly more today, but is still confused to time.  No overnight fever or vomiting reported.  Discharge Exam: Vitals:   04/02/19 0022 04/02/19 0447  BP: (!) 159/81 (!) 141/64  Pulse: (!) 53 81  Resp: 18 17  Temp: 98.7 F (37.1 C) 98.4 F (36.9 C)  SpO2: 98% 97%    General: Pt is alert, awake, not in acute distress.  Elderly female lying in bed, wakes up slightly more but confused to time. Cardiovascular: rate controlled, S1/S2 + Respiratory: bilateral decreased breath sounds at bases Abdominal: Soft, NT, ND, bowel sounds + Extremities: no edema, no cyanosis    The results of significant diagnostics from this hospitalization (including imaging, microbiology, ancillary and laboratory) are listed below for reference.     Microbiology: Recent Results (from the past 240 hour(s))  SARS CORONAVIRUS 2 (TAT 6-24 HRS) Nasopharyngeal Nasopharyngeal Swab     Status: None   Collection Time: 03/30/19  1:30 PM   Specimen: Nasopharyngeal Swab  Result Value Ref Range Status    SARS Coronavirus 2 NEGATIVE NEGATIVE Final    Comment: (NOTE) SARS-CoV-2 target nucleic acids are NOT DETECTED. The SARS-CoV-2 RNA is generally detectable in upper and lower respiratory specimens during the acute phase of infection. Negative results do not preclude SARS-CoV-2 infection, do not rule out co-infections with other pathogens, and should not be used as the sole basis for treatment or other patient management decisions. Negative results must be combined with clinical observations, patient history, and epidemiological information. The expected result is Negative. Fact Sheet for Patients: SugarRoll[employer]be Fact Sheet for Healthcare Providers: https://www.woods-mathews.com/  This test is not yet approved or cleared by the [place] and  has been authorized for detection and/or diagnosis of SARS-CoV-2 by FDA under an Emergency Use Authorization (EUA). This EUA will remain  in effect (meaning this test can be used) for the duration of the COVID-19 declaration under Section 56 4(b)(1) of the Act, 21 U.S.C. section 360bbb-3(b)(1), unless the authorization is terminated or revoked sooner. Performed at [facility], [place] [address] [place], [place] [address]      Labs: BNP (last 3 results) No results for input(s): BNP in the last 8760 hours. Basic Metabolic Panel: Recent Labs  Lab 03/30/19 1138 03/30/19 1149 04/02/19 0353  NA 139 139 142  K 4.2 4.6 4.1  CL 106 104 110  CO2 24  --  22  GLUCOSE 256* 243* 146*  BUN 19 26* 30*  CREATININE 1.16* 1.00 1.45*  CALCIUM 9.3  --  9.0  MG  --   --  1.4*   Liver Function Tests: Recent Labs  Lab 03/30/19 1138  AST 46*  ALT 23  ALKPHOS 54  BILITOT 1.4*  PROT 6.0*  ALBUMIN 2.6*   No results for input(s): LIPASE, AMYLASE in the last 168 hours. No results for input(s): AMMONIA in the last 168 hours. CBC: Recent Labs  Lab 03/30/19 1138 03/30/19 1149 03/30/19 1628  04/02/19 0353  WBC 6.6  --   --  6.4  NEUTROABS 3.3  --   --  3.8  HGB 10.4* 10.2*  --  10.3*  HCT 32.8* 30.0* 28.7* 30.2*  MCV 105.1*  --   --  101.3*  PLT 91*  --   --  71*   Cardiac Enzymes: No results for input(s): CKTOTAL, CKMB, CKMBINDEX, TROPONINI in the last 168 hours. BNP: Invalid input(s): POCBNP CBG: Recent Labs  Lab 04/01/19 1742 04/01/19 2133 04/02/19 0026 04/02/19 0448 04/02/19 0635  GLUCAP 224* 123* 149* 152* 153*   D-Dimer No results for input(s): DDIMER in the last 72 hours. Hgb A1c Recent Labs    03/31/19 0638  HGBA1C 8.5*   Lipid Profile Recent Labs    03/31/19 0638  CHOL 92  HDL 20*  LDLCALC 55  TRIG 84  CHOLHDL 4.6   Thyroid function studies Recent Labs    03/30/19 1628  TSH 1.543   Anemia work up Recent Labs    03/30/19 1628  VITAMINB12 880   Urinalysis    Component Value Date/Time   COLORURINE YELLOW 03/31/2019 0200   APPEARANCEUR HAZY (A) 03/31/2019 0200   LABSPEC 1.011 03/31/2019 0200   PHURINE 7.0 03/31/2019 0200   GLUCOSEU NEGATIVE 03/31/2019 0200   HGBUR NEGATIVE 03/31/2019 0200   HGBUR trace-intact 04/29/2009 1152   BILIRUBINUR NEGATIVE 03/31/2019 0200   BILIRUBINUR Negative 09/17/2018 1536   KETONESUR NEGATIVE 03/31/2019 0200   PROTEINUR NEGATIVE 03/31/2019 0200   UROBILINOGEN 2.0 (A) 09/17/2018 1536   UROBILINOGEN 1.0 06/19/2011 1328   NITRITE NEGATIVE 03/31/2019 0200   LEUKOCYTESUR NEGATIVE 03/31/2019 0200   Sepsis Labs Invalid input(s): PROCALCITONIN,  WBC,  LACTICIDVEN Microbiology Recent Results (from the past 240 hour(s))  SARS CORONAVIRUS 2 (TAT 6-24 HRS) Nasopharyngeal Nasopharyngeal Swab     Status: None   Collection Time: 03/30/19  1:30 PM   Specimen: Nasopharyngeal Swab  Result Value Ref Range Status   SARS Coronavirus 2 NEGATIVE NEGATIVE Final    Comment: (NOTE) SARS-CoV-2 target nucleic acids are NOT DETECTED. The SARS-CoV-2 RNA is generally detectable in upper and lower respiratory  specimens during the acute phase of infection. Negative results do not preclude SARS-CoV-2 infection, do not rule out co-infections with other pathogens, and should not be used as the sole basis for treatment or other patient management decisions. Negative results must be combined with clinical observations, patient history, and epidemiological information. The expected result is Negative. Fact Sheet for Patients: SugarRoll.be Fact Sheet for Healthcare Providers: https://www.woods-mathews.com/ This test is not yet approved or cleared by the [place] FDA and  has been authorized for detection and/or diagnosis of SARS-CoV-2 by FDA under an Emergency Use Authorization (EUA). This EUA will remain  in effect (meaning this test can be used) for the duration of the COVID-19 declaration under Section 56 4(b)(1) of the Act, 21 U.S.C. section 360bbb-3(b)(1), unless the authorization is terminated or revoked sooner. Performed at [facility], [place] [address], [place], [place] [address]      Time coordinating discharge: 35 minutes  SIGNED:   Aline August, MD  [facility] 04/02/2019, 10:27 AM
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
## 2019-04-02 NOTE — Progress Notes (Signed)
Inpatient Rehabilitation  Patient information reviewed and entered into eRehab system by Nahara Dona M. Terease Marcotte, M.A., CCC/SLP, PPS Coordinator.  Information including medical coding, functional ability and quality indicators will be reviewed and updated through discharge.    

## 2019-04-02 NOTE — Evaluation (Signed)
Physical Therapy Assessment and Plan  Patient Details  Name: Stacey Harris MRN: [id number] Date of Birth: 1928-10-22  PT Diagnosis: Abnormality of gait, Coordination disorder, Difficulty walking, Hemiplegia non-dominant and Muscle weakness Rehab Potential: Good ELOS: 14-18 days   Today's Date: 04/03/2019 PT Individual Time: 1100-1155 PT Individual Time Calculation (min): 55 min    Problem List:  Patient Active Problem List   Diagnosis Date Noted  . Right thalamic stroke ([place]) 04/02/2019  . Stroke of right basal ganglia ([place]) 04/02/2019  . Basal ganglia stroke ([place])   . AKI (acute kidney injury) ([place])   . Hypomagnesemia   . Uncontrolled type 2 diabetes mellitus with hyperglycemia ([place])   . Stroke ([place]) 03/30/2019  . CKD (chronic kidney disease) 03/30/2019  . Recurrent UTI (urinary tract infection) 09/17/2018  . Fall at home 09/17/2018  . Frequent urination 04/29/2018  . Elevated serum creatinine 04/11/2018  . Dysuria 11/12/2017  . SOB (shortness of breath) on exertion 11/12/2017  . History of fall 08/27/2016  . Controlled type 2 diabetes mellitus with diabetic autonomic neuropathy ([place]) 02/24/2016  . Routine general medical examination at a health care facility 02/12/2016  . MDS (myelodysplastic syndrome) ([place]) 10/24/2015  . Normocytic anemia 02/11/2015  . GERD (gastroesophageal reflux disease) 08/06/2014  . Encounter for Medicare annual wellness exam 08/03/2013  . Thrombocytopenia ([place]) 08/03/2013  . Insomnia 01/26/2013  . Hypothyroid 09/17/2011  . Lumbar spinal stenosis 06/21/2011    Class: Present on Admission  . BACK PAIN, LUMBAR 08/23/2010  . POSTNASAL DRIP 08/23/2010  . OSTEOARTHRITIS, HIP 03/16/2009  . BASAL CELL CARCINOMA, FACE 07/01/2007  . BASAL CELL CARCINOMA, FACE 07/01/2007  . DIABETIC PERIPHERAL NEUROPATHY 09/24/2006  . Hyperlipidemia 09/24/2006  . Essential hypertension 09/24/2006  . ROSACEA 09/24/2006  . INCONTINENCE, URGE 09/24/2006  . COLONOSCOPY  AND REMOVAL OF LESION, HX OF 06/09/2003    Past Medical History:  Past Medical History:  Diagnosis Date  . Blood transfusion   . Cancer ([place])    basal cell skin CA  . Diabetes mellitus    type II  . Frequent UTI   . Hyperlipidemia   . Hypertension   . Hypothyroidism   . Macular degeneration of both eyes   . OA (osteoarthritis)    Past Surgical History:  Past Surgical History:  Procedure Laterality Date  . ANKLE FRACTURE SURGERY  10   rt  . CARPAL TUNNEL RELEASE Right 06/06/2017   Procedure: RIGHT CARPAL TUNNEL RELEASE;  Surgeon: Daryll Brod, MD;  Location: [facility];  Service: Orthopedics;  Laterality: Right;  . CATARACT EXTRACTION W/ INTRAOCULAR LENS  IMPLANT, BILATERAL Bilateral 2019  . JOINT REPLACEMENT Right 2010   rt total hip 10  . LUMBAR LAMINECTOMY/DECOMPRESSION MICRODISCECTOMY  06/22/2011   Procedure: LUMBAR LAMINECTOMY/DECOMPRESSION MICRODISCECTOMY;  Surgeon: Marybelle Killings;  Location: [facility];  Service: Orthopedics;  Laterality: N/A;  L3-4, L4-5 Decompression  . MOHS SURGERY  12/08   basal cell skin cancer lt temple  . TAH      and BSO, age 68  . TONSILLECTOMY      Assessment & Plan Clinical Impression: Patient is a 85 y/o female admitted on 03/30/19 for sudden onset of L sided weakness and slurred speech. PMH significant for HTN, DM, CKD III, myelodysplastic syndrome, chronic thrombocytopenia, and macrocytic anemia. Per pt's daughter, pt had unreported fall on Friday prior to admission, unsure whether pt had LOC or head trauma. CT showed R thalamic infarct and small vessel disease, pt outside window for  tPA. MRI showed acute to early subacute R BG infarct and small vessel disease along with atrophy. MRA head/neck normal. Echo normal. HgbA1C elevated at 8.5. Neuro recommended asp 81 daily and control of hyperlipidemia. Hospital course BP control and diabetes management. Therapy evaluations were completed and pt was recommended for CIR due to decline  in functional status in the setting of acute CVA. Patient transferred to CIR on 04/02/2019 .   Patient currently requires max with mobility secondary to muscle weakness, decreased cardiorespiratoy endurance, unbalanced muscle activation, decreased coordination and decreased motor planning and decreased sitting balance, decreased standing balance, decreased postural control, hemiplegia and decreased balance strategies.  Prior to hospitalization, patient was modified independent  with mobility and lived with Daughter(lives on daughter's farm) in a House home.  Home access is 2 steps, per chart reviewStairs to enter.  Patient will benefit from skilled PT intervention to maximize safe functional mobility, minimize fall risk and decrease caregiver burden for planned discharge home with 24 hour assist.  Anticipate patient will benefit from follow up [facility] at discharge.  PT - End of Session Activity Tolerance: Tolerates < 10 min activity, no significant change in vital signs Endurance Deficit: Yes Endurance Deficit Description: Rest breaks within BADL tasks PT Assessment Rehab Potential (ACUTE/IP ONLY): Good PT Barriers to Discharge: Medical stability PT Patient demonstrates impairments in the following area(s): Balance;Behavior;Motor;Endurance;Safety PT Transfers Functional Problem(s): Bed Mobility;Bed to Chair;Car;Furniture;Floor PT Locomotion Functional Problem(s): Ambulation;Wheelchair Mobility;Stairs PT Plan PT Intensity: Minimum of 1-2 x/day ,45 to 90 minutes PT Frequency: 5 out of 7 days PT Duration Estimated Length of Stay: 14-18 days PT Treatment/Interventions: Ambulation/gait training;Cognitive remediation/compensation;Discharge planning;DME/adaptive equipment instruction;Functional mobility training;Pain management;Psychosocial support;Splinting/orthotics;Therapeutic Activities;UE/LE Strength taining/ROM;Visual/perceptual remediation/compensation;Wheelchair propulsion/positioning;UE/LE  Coordination activities;Therapeutic Exercise;Stair training;Skin care/wound management;Neuromuscular re-education;Patient/family education;Functional electrical stimulation;Disease management/prevention;Community reintegration;Balance/vestibular training PT Transfers Anticipated Outcome(s): CGA PT Locomotion Anticipated Outcome(s): min assist short distance gait PT Recommendation Follow Up Recommendations: Home health PT Patient destination: Home Equipment Recommended: To be determined Equipment Details: has SPC and rollator  Skilled Therapeutic Intervention  Pt in supine and agreeable to therapy, no c/o pain. Bed mobility w/ max assist and max assist stand pivot to [facility]. Pt needed multiple sit<>stands w/ mod-max assist from [facility] in order for therapist to remove brief. Pt incontinent of void and voided more in BSC. Total assist for pericare and brief management, stand pivot to w/c w/ max assist. Total assist w/c transport to/from therapy gym. Performed gait as detailed below, needed mod-max assist for upright balance and weight shifting w/ RW. Verbal and tactile cues to maintain full upright and keep BLEs extended. W/c follow for safety. W/c mobility w/ BLEs and max-total assist x25' w/ increased time. Kinetron 5 min @ level 90 cm/sec w/ BLEs to work on LE strengthening and global endurance. Min tactile and visual cues for technique.   Instructed pt in results of PT evaluation as detailed below, PT POC, rehab potential, rehab goals, and discharge recommendations. Called and discussed this w/ pt's daughter as well, [name]. Additionally discussed CIR's policies regarding fall safety and use of chair alarm and/or quick release belt. Pt verbalized understanding and in agreement. Ended session in w/c, all needs in reach.  PT Evaluation Precautions/Restrictions Precautions Precautions: Fall Restrictions Weight Bearing Restrictions: No Home Living/Prior Functioning Home Living Available Help at  Discharge: Available 24 hours/day;Family Type of Home: House Home Access: Level entry Entrance Stairs-Rails: (per chart review, no rails, pt unable to state if that is the case or not) Home Layout: Able to live  on main level with bedroom/bathroom;Two level  Lives With: Daughter(lives on daughter's farm) Prior Function Level of Independence: Needs assistance with homemaking;Requires assistive device for independence;Independent with basic ADLs Driving: No Vocation: Retired Comments: Daughter did cooking and cleaning, pt reports she did bathing and dressing herself. Primarily uses SPC, per chart review. Vision/Perception  Vision - Assessment Eye Alignment: Within Functional Limits Ocular Range of Motion: Within Functional Limits Alignment/Gaze Preference: Within Defined Limits Tracking/Visual Pursuits: Decreased smoothness of eye movement to LEFT superior field;Decreased smoothness of eye movement to LEFT inferior field;Requires cues, head turns, or add eye shifts to track  Cognition   Sensation Sensation Light Touch: Appears Intact Coordination Gross Motor Movements are Fluid and Coordinated: No Heel Shin Test: Decreased speed bilaterally, L impaired Motor  Motor Motor: Hemiplegia Motor - Skilled Clinical Observations: L hemi  Mobility Bed Mobility Bed Mobility: Rolling Right;Rolling Left;Supine to Sit;Sit to Supine Rolling Right: Moderate Assistance - Patient 50-74% Rolling Left: Moderate Assistance - Patient 50-74% Supine to Sit: Maximal Assistance - Patient - Patient 25-49% Sit to Supine: Maximal Assistance - Patient 25-49% Transfers Transfers: Sit to Stand;Stand Pivot Transfers;Stand to Sit Sit to Stand: Maximal Assistance - Patient 25-49% Stand to Sit: Maximal Assistance - Patient 25-49% Stand Pivot Transfers: Maximal Assistance - Patient 25 - 49% Stand Pivot Transfer Details: Verbal cues for safe use of DME/AE;Manual facilitation for weight shifting;Tactile cues for  initiation;Tactile cues for posture;Manual facilitation for placement Transfer (Assistive device): None Locomotion  Gait Ambulation: Yes Gait Assistance: Maximal Assistance - Patient 25-49% Gait Distance (Feet): 5 Feet Assistive device: Rolling walker Gait Assistance Details: Manual facilitation for placement;Verbal cues for safe use of DME/AE;Manual facilitation for weight shifting;Verbal cues for precautions/safety;Visual cues/gestures for sequencing;Tactile cues for initiation;Tactile cues for posture Gait Gait: Yes Gait Pattern: Impaired Gait Pattern: Step-to pattern;Shuffle;Poor foot clearance - right;Poor foot clearance - left;Trunk flexed;Left flexed knee in stance;Right flexed knee in stance Gait velocity: decreased Stairs / Additional Locomotion Stairs: No Wheelchair Mobility Wheelchair Mobility: Yes Wheelchair Assistance: Total Assistance - Patient <25% Wheelchair Propulsion: Both upper extremities Wheelchair Parts Management: Needs assistance Distance: 70'  Trunk/Postural Assessment  Cervical Assessment Cervical Assessment: Exceptions to WFL(forward head) Thoracic Assessment Thoracic Assessment: Within Functional Limits Lumbar Assessment Lumbar Assessment: Exceptions to WFL(posterior pelvic tilt) Postural Control Postural Control: Deficits on evaluation(posterior lean bias in stance)  Balance Balance Balance Assessed: Yes Static Sitting Balance Static Sitting - Level of Assistance: 5: Stand by assistance Dynamic Sitting Balance Dynamic Sitting - Level of Assistance: 4: Min assist Static Standing Balance Static Standing - Level of Assistance: 4: Min assist Dynamic Standing Balance Dynamic Standing - Level of Assistance: 2: Max assist Extremity Assessment  RLE Assessment RLE Assessment: Exceptions to [facility] Passive Range of Motion (PROM) Comments: WFL General Strength Comments: Globally 3+ to 4/5 LLE Assessment LLE Assessment: Exceptions to [name] Passive Range of  Motion (PROM) Comments: WFL General Strength Comments: Globally 2+ to 3+/5    Refer to Care Plan for Long Term Goals  Recommendations for other services: None   Discharge Criteria: Patient will be discharged from PT if patient refuses treatment 3 consecutive times without medical reason, if treatment goals not met, if there is a change in medical status, if patient makes no progress towards goals or if patient is discharged from hospital.  The above assessment, treatment plan, treatment alternatives and goals were discussed and mutually agreed upon: by patient and by family  Winson Eichorn Clent Demark 04/03/2019, 12:43 PM

## 2019-04-02 NOTE — Care Management Important Message (Signed)
Important Message  Patient Details  Name: Stacey Harris MRN: [id number] Date of Birth: July 25, 1928   Medicare Important Message Given:  Yes     [name] 04/02/2019, 3:39 PM

## 2019-04-02 NOTE — TOC Transition Note (Signed)
Transition of Care [facility]) - CM/SW Discharge Note   Patient Details  Name: TALORE CLINE MRN: [id number] Date of Birth: 1929-01-24  Transition of Care [facility]) CM/SW Contact:  Pollie Friar, RN Phone Number: 04/02/2019, 10:55 AM   Clinical Narrative:    Patient is discharging to CIR today. CM signing off.    Final next level of care: IP Rehab Facility Barriers to Discharge: No Barriers Identified   Patient Goals and CMS Choice        Discharge Placement                       Discharge Plan and Services                                     Social Determinants of Health (SDOH) Interventions     Readmission Risk Interventions No flowsheet data found.

## 2019-04-03 ENCOUNTER — Inpatient Hospital Stay (HOSPITAL_COMMUNITY): Payer: Medicare HMO | Admitting: Physical Therapy

## 2019-04-03 ENCOUNTER — Inpatient Hospital Stay (HOSPITAL_COMMUNITY): Payer: Medicare HMO | Admitting: Occupational Therapy

## 2019-04-03 ENCOUNTER — Inpatient Hospital Stay (HOSPITAL_COMMUNITY): Payer: Medicare HMO | Admitting: Speech Pathology

## 2019-04-03 DIAGNOSIS — D696 Thrombocytopenia, unspecified: Secondary | ICD-10-CM

## 2019-04-03 DIAGNOSIS — I639 Cerebral infarction, unspecified: Secondary | ICD-10-CM

## 2019-04-03 DIAGNOSIS — R7989 Other specified abnormal findings of blood chemistry: Secondary | ICD-10-CM

## 2019-04-03 DIAGNOSIS — E119 Type 2 diabetes mellitus without complications: Secondary | ICD-10-CM

## 2019-04-03 DIAGNOSIS — I10 Essential (primary) hypertension: Secondary | ICD-10-CM

## 2019-04-03 LAB — COMPREHENSIVE METABOLIC PANEL
ALT: 27 U/L (ref 0–44)
AST: 46 U/L — ABNORMAL HIGH (ref 15–41)
Albumin: 2.3 g/dL — ABNORMAL LOW (ref 3.5–5.0)
Alkaline Phosphatase: 56 U/L (ref 38–126)
Anion gap: 8 (ref 5–15)
BUN: 27 mg/dL — ABNORMAL HIGH (ref 8–23)
CO2: 24 mmol/L (ref 22–32)
Calcium: 8.8 mg/dL — ABNORMAL LOW (ref 8.9–10.3)
Chloride: 109 mmol/L (ref 98–111)
Creatinine, Ser: 1.24 mg/dL — ABNORMAL HIGH (ref 0.44–1.00)
GFR calc Af Amer: 45 mL/min — ABNORMAL LOW (ref >60)
GFR calc non Af Amer: 39 mL/min — ABNORMAL LOW (ref >60)
Glucose, Bld: 136 mg/dL — ABNORMAL HIGH (ref 70–99)
Potassium: 3.9 mmol/L (ref 3.5–5.1)
Sodium: 141 mmol/L (ref 135–145)
Total Bilirubin: 1.1 mg/dL (ref 0.3–1.2)
Total Protein: 5.7 g/dL — ABNORMAL LOW (ref 6.5–8.1)

## 2019-04-03 LAB — GLUCOSE, CAPILLARY
Glucose-Capillary: 144 mg/dL — ABNORMAL HIGH (ref 70–99)
Glucose-Capillary: 167 mg/dL — ABNORMAL HIGH (ref 70–99)
Glucose-Capillary: 196 mg/dL — ABNORMAL HIGH (ref 70–99)
Glucose-Capillary: 140 mg/dL — ABNORMAL HIGH (ref 70–99)

## 2019-04-03 LAB — CBC WITH DIFFERENTIAL/PLATELET
Abs Immature Granulocytes: 0.01 10*3/uL (ref 0.00–0.07)
Basophils Absolute: 0 10*3/uL (ref 0.0–0.1)
Basophils Relative: 0 %
Eosinophils Absolute: 0.2 10*3/uL (ref 0.0–0.5)
Eosinophils Relative: 3 %
HCT: 30.2 % — ABNORMAL LOW (ref 36.0–46.0)
Hemoglobin: 10.2 g/dL — ABNORMAL LOW (ref 12.0–15.0)
Immature Granulocytes: 0 %
Lymphocytes Relative: 30 %
Lymphs Abs: 1.6 10*3/uL (ref 0.7–4.0)
MCH: 34.3 pg — ABNORMAL HIGH (ref 26.0–34.0)
MCHC: 33.8 g/dL (ref 30.0–36.0)
MCV: 101.7 fL — ABNORMAL HIGH (ref 80.0–100.0)
Monocytes Absolute: 0.5 10*3/uL (ref 0.1–1.0)
Monocytes Relative: 10 %
Neutro Abs: 3 10*3/uL (ref 1.7–7.7)
Neutrophils Relative %: 57 %
Platelets: 67 10*3/uL — ABNORMAL LOW (ref 150–400)
RBC: 2.97 MIL/uL — ABNORMAL LOW (ref 3.87–5.11)
RDW: 14.9 % (ref 11.5–15.5)
WBC: 5.2 10*3/uL (ref 4.0–10.5)
nRBC: 0 % (ref 0.0–0.2)

## 2019-04-03 MED ORDER — POLYETHYLENE GLYCOL 3350 17 G PO PACK
17.0000 g | PACK | Freq: Every day | ORAL | Status: DC
  Administered 2019-04-03 – 2019-04-07 (×5): 17 g via ORAL
  Filled 2019-04-03 (×5): qty 1

## 2019-04-03 MED ORDER — SENNOSIDES-DOCUSATE SODIUM 8.6-50 MG PO TABS
2.0000 | ORAL_TABLET | Freq: Every day | ORAL | Status: DC
  Administered 2019-04-03 – 2019-04-07 (×4): 2 via ORAL
  Filled 2019-04-03 (×5): qty 2

## 2019-04-03 NOTE — Progress Notes (Signed)
[place] PHYSICAL MEDICINE & REHABILITATION PROGRESS NOTE   Subjective/Complaints: Reports no problems overnight. Denies pain. Things are "slow"  ROS: Limited due to cognitive/behavioral    Objective:   No results found. Recent Labs    04/02/19 0353 04/03/19 0556  WBC 6.4 5.2  HGB 10.3* 10.2*  HCT 30.2* 30.2*  PLT 71* 67*   Recent Labs    04/02/19 0353 04/03/19 0556  NA 142 141  K 4.1 3.9  CL 110 109  CO2 22 24  GLUCOSE 146* 136*  BUN 30* 27*  CREATININE 1.45* 1.24*  CALCIUM 9.0 8.8*    Intake/Output Summary (Last 24 hours) at 04/03/2019 1059 Last data filed at 04/03/2019 0800 Gross per 24 hour  Intake 120 ml  Output -  Net 120 ml     Physical Exam: Vital Signs Blood pressure 138/77, pulse (!) 57, temperature 98.4 F (36.9 C), temperature source Oral, resp. rate 18, SpO2 99 %.  Constitutional: No distress . Vital signs reviewed. HEENT: EOMI, oral membranes moist Neck: supple Cardiovascular: RRR without murmur. No JVD    Respiratory: CTA Bilaterally without wheezes or rales. Normal effort    GI: BS +, non-tender, non-distended .  Musculoskeletal:     Comments: No edema or tenderness in extremities  Neurological:  Alert and oriented x3 with increased time and cues. Delayed processing. Left facial weakness. No tongue deviation She was able to follow simple one step motor commands.  Motor: Right upper extremity: 5/5 proximal distal Left upper extremity: 2+ to 3-/5 proximal distal Right lower extremity: 4+/5 proximal distal Left lower extremity:3/5 proximal to distal Dysarthria Mild left inattention. Mild left leg sensory deficit LT Skin: Skin is warm and dry. She is not diaphoretic.  Psychiatric: very flat  Assessment/Plan: 1. Functional deficits secondary to right thalamic infarct which require 3+ hours per day of interdisciplinary therapy in a comprehensive inpatient rehab setting.  Physiatrist is providing close team supervision and 24 hour  management of active medical problems listed below.  Physiatrist and rehab team continue to assess barriers to discharge/monitor patient progress toward functional and medical goals  Care Tool:  Bathing              Bathing assist       Upper Body Dressing/Undressing Upper body dressing   What is the patient wearing?: Hospital gown only    Upper body assist Assist Level: Moderate Assistance - Patient 50 - 74%    Lower Body Dressing/Undressing Lower body dressing      What is the patient wearing?: Hospital gown only, Incontinence brief     Lower body assist Assist for lower body dressing: Moderate Assistance - Patient 50 - 74%     Toileting Toileting    Toileting assist Assist for toileting: Maximal Assistance - Patient 25 - 49%     Transfers Chair/bed transfer  Transfers assist     Chair/bed transfer assist level: Maximal Assistance - Patient 25 - 49%     Locomotion Ambulation   Ambulation assist              Walk 10 feet activity   Assist           Walk 50 feet activity   Assist           Walk 150 feet activity   Assist           Walk 10 feet on uneven surface  activity   Assist  Wheelchair     Assist               Wheelchair 50 feet with 2 turns activity    Assist            Wheelchair 150 feet activity     Assist          Blood pressure 138/77, pulse (!) 57, temperature 98.4 F (36.9 C), temperature source Oral, resp. rate 18, SpO2 99 %.  Medical Problem List and Plan: 1.  Functional deficits  secondary to Right thalamic stroke             -Patient is beginning CIR therapies today including PT, OT, and SLP  2.  Antithrombotics: -DVT/anticoagulation:  Mechanical:  Antiembolism stockings, knee (TED hose) Bilateral lower extremities             -antiplatelet therapy: Low dose ASA, will DC if platelets trend less than 50. 3. Pain Management: Tylenol prn  4. Mood: LCSW  to follow for evaluation and support.              -antipsychotic agents: N/a 5. Neuropsych: This patient is not fully capable of making decisions on her own behalf. 6. Skin/Wound Care: Routine pressure relief measures.  7. Fluids/Electrolytes/Nutrition: encourage PO  I personally reviewed the patient's labs today.    -BUN/Cr improved  -protein supp for low albumin 8. HTN: Monitor BP tid and avoid hypoperfusion. Continue to hold Cozaar and HCTZ.              monitor 9. T2DM with hyperglycemia: Hgb A1C- 8.5. Blood sugars still labile---will monitor BS ac/hs. Was on Amaryl and metformin PTA--  -continue Amaryl.   -Discontinued metformin due to CKD.   -titrate amaryl pending trends. 10. Myelodysplasia with acute on chronic thrombocytopenia: Platelets on downward trend 91-->71--> 67 today  -serial CBC's  -no clinical signs of bleeding  -hgb stable at 10.2  11. Acute on chronic renal failure: BUN on rise--encourage fluid intake. Monitor with serial checks.              as above bun/cr better today  -recheck monday. 12. Hypomagnesemia: 1.4 yesterday               Mag level pending 13. Dyslipidemia: On Crestor.    LOS: 1 days A FACE TO [place] 04/03/2019, 10:59 AM

## 2019-04-03 NOTE — Care Management Note (Signed)
Inpatient Rehabilitation Center Individual Statement of Services  Patient Name:  Stacey Harris  Date:  04/03/2019  Welcome to the [facility].  Our goal is to provide you with an individualized program based on your diagnosis and situation, designed to meet your specific needs.  With this comprehensive rehabilitation program, you will be expected to participate in at least 3 hours of rehabilitation therapies Monday-Friday, with modified therapy programming on the weekends.  Your rehabilitation program will include the following services:  Physical Therapy (PT), Occupational Therapy (OT), Speech Therapy (ST), 24 hour per day rehabilitation nursing, Case Management (Social Worker), Rehabilitation Medicine, Nutrition Services and Pharmacy Services  Weekly team conferences will be held on Tuesday to discuss your progress.  Your Social Worker will talk with you frequently to get your input and to update you on team discussions.  Team conferences with you and your family in attendance may also be held.  Expected length of stay: 14-18 days  Overall anticipated outcome: supervision-min assist level  Depending on your progress and recovery, your program may change. Your Social Worker will coordinate services and will keep you informed of any changes. Your Social Worker's name and contact numbers are listed  below.  The following services may also be recommended but are not provided by the [facility]:    [place] will be made to provide these services after discharge if needed.  Arrangements include referral to agencies that provide these services.  Your insurance has been verified to be:  [employer] Your primary doctor is:  [facility]  Pertinent information will be shared with your doctor and your insurance company.  Social Worker:  Lennart Pall, [place] or ([phone]   Information discussed with and copy given to patient by: Elease Hashimoto, 04/03/2019, 1:55 PM

## 2019-04-03 NOTE — Progress Notes (Signed)
Social Work Assessment and Plan   Patient Details  Name: Stacey Harris MRN: [id number] Date of Birth: 10-16-1928  Today's Date: 04/03/2019  Problem List:  Patient Active Problem List   Diagnosis Date Noted  . Right thalamic stroke ([place]) 04/02/2019  . Stroke of right basal ganglia ([place]) 04/02/2019  . Basal ganglia stroke ([place])   . AKI (acute kidney injury) ([place])   . Hypomagnesemia   . Uncontrolled type 2 diabetes mellitus with hyperglycemia ([place])   . Stroke ([place]) 03/30/2019  . CKD (chronic kidney disease) 03/30/2019  . Recurrent UTI (urinary tract infection) 09/17/2018  . Fall at home 09/17/2018  . Frequent urination 04/29/2018  . Elevated serum creatinine 04/11/2018  . Dysuria 11/12/2017  . SOB (shortness of breath) on exertion 11/12/2017  . History of fall 08/27/2016  . Controlled type 2 diabetes mellitus with diabetic autonomic neuropathy ([place]) 02/24/2016  . Routine general medical examination at a health care facility 02/12/2016  . MDS (myelodysplastic syndrome) ([place]) 10/24/2015  . Normocytic anemia 02/11/2015  . GERD (gastroesophageal reflux disease) 08/06/2014  . Encounter for Medicare annual wellness exam 08/03/2013  . Thrombocytopenia ([place]) 08/03/2013  . Insomnia 01/26/2013  . Hypothyroid 09/17/2011  . Lumbar spinal stenosis 06/21/2011    Class: Present on Admission  . BACK PAIN, LUMBAR 08/23/2010  . POSTNASAL DRIP 08/23/2010  . OSTEOARTHRITIS, HIP 03/16/2009  . BASAL CELL CARCINOMA, FACE 07/01/2007  . BASAL CELL CARCINOMA, FACE 07/01/2007  . DIABETIC PERIPHERAL NEUROPATHY 09/24/2006  . Hyperlipidemia 09/24/2006  . Essential hypertension 09/24/2006  . ROSACEA 09/24/2006  . INCONTINENCE, URGE 09/24/2006  . COLONOSCOPY AND REMOVAL OF LESION, HX OF 06/09/2003   Past Medical History:  Past Medical History:  Diagnosis Date  . Blood transfusion   . Cancer ([place])    basal cell skin CA  . Diabetes mellitus    type II  . Frequent UTI   . Hyperlipidemia   .  Hypertension   . Hypothyroidism   . Macular degeneration of both eyes   . OA (osteoarthritis)    Past Surgical History:  Past Surgical History:  Procedure Laterality Date  . ANKLE FRACTURE SURGERY  10   rt  . CARPAL TUNNEL RELEASE Right 06/06/2017   Procedure: RIGHT CARPAL TUNNEL RELEASE;  Surgeon: Daryll Brod, MD;  Location: [facility];  Service: Orthopedics;  Laterality: Right;  . CATARACT EXTRACTION W/ INTRAOCULAR LENS  IMPLANT, BILATERAL Bilateral 2019  . JOINT REPLACEMENT Right 2010   rt total hip 10  . LUMBAR LAMINECTOMY/DECOMPRESSION MICRODISCECTOMY  06/22/2011   Procedure: LUMBAR LAMINECTOMY/DECOMPRESSION MICRODISCECTOMY;  Surgeon: Marybelle Killings;  Location: [facility];  Service: Orthopedics;  Laterality: N/A;  L3-4, L4-5 Decompression  . MOHS SURGERY  12/08   basal cell skin cancer lt temple  . TAH      and BSO, age 82  . TONSILLECTOMY     Social History:  reports that she has never smoked. She has never used smokeless tobacco. She reports that she does not drink alcohol or use drugs.  Family / Support Systems Marital Status: Widow/Widower Patient Roles: Parent Children: Elinor Parkinson [id number] Other Supports: Karlyne Greenspan  [id number] Anticipated Caregiver: [name] Ability/Limitations of Caregiver: Wokrs on the farm and would leave her for 1-2 hours at a time. Aware may need 24 hr care now Caregiver Availability: 24/7 Family Dynamics: Close knit family pt has four children who are involved and supportive. She feels she has good supports and her family will make sure she has  what she needs at discharge. Daughter has been assisting prior to admission  Social History Preferred language: English Religion: Christian Cultural Background: No issues Education: HS Read: Yes Write: Yes Employment Status: Retired Public relations account executive Issues: No issues Guardian/Conservator: None-according to MD pt is not capable of making her own  decisions while here. WIll look toward her daughter to make any decisions while here   Abuse/Neglect Abuse/Neglect Assessment Can Be Completed: Yes Physical Abuse: Denies Verbal Abuse: Denies Sexual Abuse: Denies Exploitation of patient/patient's resources: Denies Self-Neglect: Denies  Emotional Status Pt's affect, behavior and adjustment status: Pt is motivated to do well and feels everything is slow and she's not moving well. She wants to get back to moving on her own her daughter helped with ADL's prior to admission. Pt doesn't want to burden her daughter she has work to do. Recent Psychosocial Issues: other health issues Psychiatric History: no history deferred depression screen due to coping appropriately  and able to verbalize her concerns and issues. She is slow to respond but does if you give her time to answer. Substance Abuse History: No issues  Patient / Family Perceptions, Expectations & Goals Pt/Family understanding of illness & functional limitations: Pt and daughter can explain her stroke and deficits. Both have spoken with the MD and feel they have a good understanding of her plan going forward. Daughter appreciated PT-Amy calling her today and updating her. Premorbid pt/family roles/activities: Mom, grandmother, retiree, church member, etc Anticipated changes in roles/activities/participation: resume Pt/family expectations/goals: Pt states: " I hope to do for myself like I did before."  Daughter states: " I will help her like I was doing before, I know she will need some help."  [employer]: None Premorbid Home Care/DME Agencies: Other (Comment)(cane) Transportation available at discharge: Daughter  Discharge Planning Living Arrangements: Children Support Systems: Children, Water engineer, Social worker community Type of Residence: Private residence Insurance Resources: Multimedia programmer (specify)([employer]) Museum/gallery curator Resources:  Fish farm manager, Family Support Financial Screen Referred: No Living Expenses: Lives with family Money Management: Family Does the patient have any problems obtaining your medications?: No Home Management: Daughter Patient/Family Preliminary Plans: Return home with daughter who was assisting her prior to admission at least with ADL's, pt was ambulating with cane mod/i level. Daughter is aware pt will need 24/7 upon discharge from rehab according to PT and expressed to daughter.  Clinical Impression Pleasant patient who needs extra time to answer questions. Her daughter has been assisting with her ADL's prior to admission. She is aware pt may require 24 hr care upon discharge from rehab. Work on discharge needs. Aware Lorre Nick will return Monday and continue to follow.  Elease Hashimoto 04/03/2019, 2:22 PM

## 2019-04-03 NOTE — IPOC Note (Signed)
Overall Plan of Care [facility]) Patient Details Name: Stacey Harris MRN: [id number] DOB: 1929/01/06  Admitting Diagnosis: Right thalamic stroke [facility])  Hospital Problems: Principal Problem:   Right thalamic stroke [facility]) Active Problems:   Stroke of right basal ganglia ([place])     Functional Problem List: Nursing Bladder, Bowel, Edema, Endurance, Medication Management, Nutrition, Safety, Skin Integrity  PT Balance, Behavior, Motor, Endurance, Safety  OT Balance, Cognition, Endurance, Motor, Perception, Safety  SLP Cognition, Nutrition, Linguistic  TR         Basic ADL's: OT Eating, Grooming, Bathing, Dressing, Toileting     Advanced  ADL's: OT       Transfers: PT Bed Mobility, Bed to Chair, Car, Furniture, Floor  OT Toilet, Metallurgist: PT Ambulation, Emergency planning/management officer, Stairs     Additional Impairments: OT Fuctional Use of Upper Extremity  SLP Swallowing, Communication, Social Cognition expression Memory, Problem Solving  TR      Anticipated Outcomes Item Anticipated Outcome  Self Feeding Supervision  Swallowing  Mod I   Basic self-care  Supervision  Toileting  Supervision   Bathroom Transfers Supervision  Bowel/Bladder  mod assist  Transfers  CGA  Locomotion  min assist short distance gait  Communication  Mod I  Cognition  Supervision  Pain  2 or less out of 10  Safety/Judgment  mod assist   Therapy Plan: PT Intensity: Minimum of 1-2 x/day ,45 to 90 minutes PT Frequency: 5 out of 7 days PT Duration Estimated Length of Stay: 14-18 days OT Intensity: Minimum of 1-2 x/day, 45 to 90 minutes OT Frequency: 5 out of 7 days OT Duration/Estimated Length of Stay: 14/18 days SLP Intensity: Minumum of 1-2 x/day, 30 to 90 minutes SLP Frequency: 3 to 5 out of 7 days SLP Duration/Estimated Length of Stay: 2-2.5 weeks   Due to the current state of emergency, patients may not be receiving their 3-hours of Medicare-mandated therapy.   Team  Interventions: Nursing Interventions Patient/Family Education, Bladder Management, Bowel Management, Disease Management/Prevention, Cognitive Remediation/Compensation, Dysphagia/Aspiration Precaution Training, Medication Management, Skin Care/Wound Management  PT interventions Ambulation/gait training, Cognitive remediation/compensation, Discharge planning, DME/adaptive equipment instruction, Functional mobility training, Pain management, Psychosocial support, Splinting/orthotics, Therapeutic Activities, UE/LE Strength taining/ROM, Visual/perceptual remediation/compensation, Wheelchair propulsion/positioning, UE/LE Coordination activities, Therapeutic Exercise, Stair training, Skin care/wound management, Neuromuscular re-education, Patient/family education, Functional electrical stimulation, Disease management/prevention, Academic librarian, Training and development officer  OT Interventions Training and development officer, Cognitive remediation/compensation, Academic librarian, Discharge planning, Disease mangement/prevention, Engineer, drilling, Functional mobility training, Neuromuscular re-education, Patient/family education, Pain management, Self Care/advanced ADL retraining, Splinting/orthotics, Therapeutic Activities, Therapeutic Exercise, UE/LE Strength taining/ROM, UE/LE Coordination activities, Wheelchair propulsion/positioning, Visual/perceptual remediation/compensation  SLP Interventions Cognitive remediation/compensation, Internal/external aids, Speech/Language facilitation, Dysphagia/aspiration precaution training, English as a second language teacher, Environmental controls, Therapeutic Activities, Functional tasks, Patient/family education  TR Interventions    SW/CM Interventions Discharge Planning, Psychosocial Support, Patient/Family Education   Barriers to Discharge MD  Medical stability  Nursing Incontinence    PT Medical stability    OT      SLP      SW       Team Discharge  Planning: Destination: PT-Home ,OT- Home , SLP-Home Projected Follow-up: PT-Home health PT, OT-  Home health OT, SLP-24 hour supervision/assistance Projected Equipment Needs: PT-To be determined, OT- To be determined, SLP-None recommended by SLP Equipment Details: PT-has SPC and rollator, OT-  Patient/family involved in discharge planning: PT- Patient, Family member/caregiver,  OT-Patient, SLP-Patient  MD ELOS: 14-18 days Medical Rehab Prognosis:  Excellent Assessment: The patient  has been admitted for CIR therapies with the diagnosis of right thalamic infarct. The team will be addressing functional mobility, strength, stamina, balance, safety, adaptive techniques and equipment, self-care, bowel and bladder mgt, patient and caregiver education, NMR, communication, cognition, community reentry. Goals have been set at supervision with self-care, CGA/min assist with mobility and supervision with cognition.   Due to the current state of emergency, patients may not be receiving their 3 hours per day of Medicare-mandated therapy.    Meredith Staggers, MD, FAAPMR      See Team Conference Notes for weekly updates to the plan of care

## 2019-04-03 NOTE — Evaluation (Signed)
Speech Language Pathology Assessment and Plan  Patient Details  Name: Stacey Harris MRN: [id number] Date of Birth: 11-12-1928  SLP Diagnosis: Dysarthria;Cognitive Impairments;Dysphagia  Rehab Potential: Good ELOS: 2-2.5 weeks    Today's Date: 04/03/2019 SLP Individual Time: 0725-0825 SLP Individual Time Calculation (min): 60 min   Problem List:  Patient Active Problem List   Diagnosis Date Noted  . Right thalamic stroke ([place]) 04/02/2019  . Stroke of right basal ganglia ([place]) 04/02/2019  . Basal ganglia stroke ([place])   . AKI (acute kidney injury) ([place])   . Hypomagnesemia   . Uncontrolled type 2 diabetes mellitus with hyperglycemia ([place])   . Stroke ([place]) 03/30/2019  . CKD (chronic kidney disease) 03/30/2019  . Recurrent UTI (urinary tract infection) 09/17/2018  . Fall at home 09/17/2018  . Frequent urination 04/29/2018  . Elevated serum creatinine 04/11/2018  . Dysuria 11/12/2017  . SOB (shortness of breath) on exertion 11/12/2017  . History of fall 08/27/2016  . Controlled type 2 diabetes mellitus with diabetic autonomic neuropathy ([place]) 02/24/2016  . Routine general medical examination at a health care facility 02/12/2016  . MDS (myelodysplastic syndrome) ([place]) 10/24/2015  . Normocytic anemia 02/11/2015  . GERD (gastroesophageal reflux disease) 08/06/2014  . Encounter for Medicare annual wellness exam 08/03/2013  . Thrombocytopenia ([place]) 08/03/2013  . Insomnia 01/26/2013  . Hypothyroid 09/17/2011  . Lumbar spinal stenosis 06/21/2011    Class: Present on Admission  . BACK PAIN, LUMBAR 08/23/2010  . POSTNASAL DRIP 08/23/2010  . OSTEOARTHRITIS, HIP 03/16/2009  . BASAL CELL CARCINOMA, FACE 07/01/2007  . BASAL CELL CARCINOMA, FACE 07/01/2007  . DIABETIC PERIPHERAL NEUROPATHY 09/24/2006  . Hyperlipidemia 09/24/2006  . Essential hypertension 09/24/2006  . ROSACEA 09/24/2006  . INCONTINENCE, URGE 09/24/2006  . COLONOSCOPY AND REMOVAL OF LESION, HX OF 06/09/2003   Past  Medical History:  Past Medical History:  Diagnosis Date  . Blood transfusion   . Cancer ([place])    basal cell skin CA  . Diabetes mellitus    type II  . Frequent UTI   . Hyperlipidemia   . Hypertension   . Hypothyroidism   . Macular degeneration of both eyes   . OA (osteoarthritis)    Past Surgical History:  Past Surgical History:  Procedure Laterality Date  . ANKLE FRACTURE SURGERY  10   rt  . CARPAL TUNNEL RELEASE Right 06/06/2017   Procedure: RIGHT CARPAL TUNNEL RELEASE;  Surgeon: Daryll Brod, MD;  Location: [place];  Service: Orthopedics;  Laterality: Right;  . CATARACT EXTRACTION W/ INTRAOCULAR LENS  IMPLANT, BILATERAL Bilateral 2019  . JOINT REPLACEMENT Right 2010   rt total hip 10  . LUMBAR LAMINECTOMY/DECOMPRESSION MICRODISCECTOMY  06/22/2011   Procedure: LUMBAR LAMINECTOMY/DECOMPRESSION MICRODISCECTOMY;  Surgeon: Marybelle Killings;  Location: [place];  Service: Orthopedics;  Laterality: N/A;  L3-4, L4-5 Decompression  . MOHS SURGERY  12/08   basal cell skin cancer lt temple  . TAH      and BSO, age 9  . TONSILLECTOMY      Assessment / Plan / Recommendation Clinical Impression Patient is an 85 year old RH female with history of T2DM with peripheral neuropathy, HTN, CKD, macular degeneration, myelodysplasia who was admitted on 03/30/2019 with slurred speech and left-sided weakness.  History taken from chart review due to cognition.  At baseline patient needed some assistance with walking and was unable to complete ADLs without assistance.   CT head done revealing age indeterminate right BG, internal capsule and corona radiata infarct  with moderate advanced generalized atrophy.   MRI brain and MRA brain/neck limited, but showed early subacute right basal ganglia infarct with occlusion/high-grade stenosis. Echocardiogram showed ejection fraction of 60-65% with mildly calcified mitral valve.  Dr. Erlinda Hong felt that stroke was due to small vessel disease and low dose ASA  added for stroke prevention. Therapy evalautions completed and patient limited by resultant left sided weakness with left inattention and cognitive deficits. CIR recommended due to functional decline and patient admitted 04/02/19.  Patient has a history of baseline cognitive deficits but demonstrates impairments in the areas of basic problem solving and recall of daily information which impacts her safety with functional and familiar tasks. Patient also demonstrates mild oral-motor weakness resulting in decreased speech intelligibility and prolonged mastication of solid textures. No overt s/s of aspiration were noted with thin liquids via straw. Recommend patient continue her current diet as this was her baseline diet at home, however, intervention is needed to maximize utilization of compensatory strategies in order to reduce impulsivity and maximize overall safety. Patient would benefit from skilled SLP intervention to maximize her cognitive and swallowing function as well as her speech intelligibility prior to discharge.    Skilled Therapeutic Interventions          Administered a cognitive-linguistic evaluation and BSE, please see above for details.   SLP Assessment  Patient will need skilled Speech Lanaguage Pathology Services during CIR admission    Recommendations  SLP Diet Recommendations: Dysphagia 3 (Mech soft);Thin Liquid Administration via: Straw;Cup Medication Administration: Whole meds with puree Supervision: Full supervision/cueing for compensatory strategies Compensations: Minimize environmental distractions;Slow rate;Small sips/bites Postural Changes and/or Swallow Maneuvers: Seated upright 90 degrees Oral Care Recommendations: Oral care BID Recommendations for Other Services: Neuropsych consult Patient destination: Home Follow up Recommendations: 24 hour supervision/assistance Equipment Recommended: None recommended by SLP    SLP Frequency 3 to 5 out of 7 days   SLP  Duration  SLP Intensity  SLP Treatment/Interventions 2-2.5 weeks  Minumum of 1-2 x/day, 30 to 90 minutes  Cognitive remediation/compensation;Internal/external aids;Speech/Language facilitation;Dysphagia/aspiration precaution training;Cueing hierarchy;Environmental controls;Therapeutic Activities;Functional tasks;Patient/family education    Pain No/Denies Pain   Prior Functioning Type of Home: House  Lives With: Daughter Available Help at Discharge: Available 24 hours/day;Family Vocation: Retired(used to teach school)  Short Term Goals: Week 1: SLP Short Term Goal 1 (Week 1): Patient will consume current diet with minimal overt s/s of aspiration with supervision level verbal cues for use of swallowing compensatory strategies. SLP Short Term Goal 2 (Week 1): Patient will utilize external aids for recall of functional information with Mod A verbal and visual cues. SLP Short Term Goal 3 (Week 1): Patient will demonstrate basic problem solving for functional and familair tasks with Min A verbal cues.  Refer to Care Plan for Long Term Goals  Recommendations for other services: Neuropsych  Discharge Criteria: Patient will be discharged from SLP if patient refuses treatment 3 consecutive times without medical reason, if treatment goals not met, if there is a change in medical status, if patient makes no progress towards goals or if patient is discharged from hospital.  The above assessment, treatment plan, treatment alternatives and goals were discussed and mutually agreed upon: by patient  Nielle Duford 04/03/2019, 3:40 PM

## 2019-04-04 ENCOUNTER — Inpatient Hospital Stay (HOSPITAL_COMMUNITY): Payer: Medicare HMO

## 2019-04-04 ENCOUNTER — Inpatient Hospital Stay (HOSPITAL_COMMUNITY): Payer: Medicare HMO | Admitting: Occupational Therapy

## 2019-04-04 ENCOUNTER — Inpatient Hospital Stay (HOSPITAL_COMMUNITY): Payer: Medicare HMO | Admitting: Speech Pathology

## 2019-04-04 LAB — GLUCOSE, CAPILLARY
Glucose-Capillary: 117 mg/dL — ABNORMAL HIGH (ref 70–99)
Glucose-Capillary: 179 mg/dL — ABNORMAL HIGH (ref 70–99)
Glucose-Capillary: 260 mg/dL — ABNORMAL HIGH (ref 70–99)
Glucose-Capillary: 187 mg/dL — ABNORMAL HIGH (ref 70–99)

## 2019-04-04 MED ORDER — MAGNESIUM GLUCONATE 500 MG PO TABS
500.0000 mg | ORAL_TABLET | Freq: Every day | ORAL | Status: DC
  Administered 2019-04-04 – 2019-04-18 (×15): 500 mg via ORAL
  Filled 2019-04-04 (×16): qty 1

## 2019-04-04 NOTE — Progress Notes (Signed)
Occupational Therapy Session Note  Patient Details  Name: Stacey Harris MRN: [id number] Date of Birth: 05-21-1929  Today's Date: 04/04/2019 OT Individual Time: 1400-1552 OT Individual Time Calculation (min): 112 min    Short Term Goals: Week 1:  OT Short Term Goal 1 (Week 1): Pt will complete toilet transfer with consistent mod A OT Short Term Goal 2 (Week 1): Pt will tolerate standing at the sink for 5 minutes in preparation for BADL task OT Short Term Goal 3 (Week 1): Pt will recall hemi dressing techniques with min questioning cues.  Skilled Therapeutic Interventions/Progress Updates: patient participated in therapy and since dtr was present and involved, she participated in much family educatoin as follows:  dtr asked for reminder/re education on patient's OT goals.   This clinician shared and explained. Mother completed UE Self Range of motion exercises and crossing midline exercises during this intial time.  Recliner chair to w/c stand pivot transfer at waslker= Mod A and extra time  Oral care seated iat sink= extra time for patient to process steps and to initially incorporate her left hand into bilateral  Integration.  Dtr was educated on the importance of allowing he mother time to process and problem the task as both she and her mother voiced mother's intentions to become as safe and independent as possible for self care.  W/c to 3:1 over toilet tranfer= Min A   (dtr and this clinician discussed patient scrub pants soaking wet and a very wet spot on patient cushion.  Cushioned wiped and allowed to dry with sanitizing wipe.   Patient asked to have a few minutes to void and attempt BM.   Dtr was educated on proper cueing for coming to front of w/c and her mother to push up from chair/surface  Toileting= extra time for brief change and  pericleansing as patient periarea and back of legs wet from very wet brief.    Patient completed sit to stand x6 for thorough cleansing and donning  of new brief and dry scrubs   Sit  To stand at toilet for cleansing and pulling up and cleane brief several times= Min A  Don brief set up like panties over feet and don pants at toilet= moderate assistance as patient with diffulty reaching feet and requiring assist to maintain leg over knee method  Toilet to w/c transfer=Min A and extra time as patient c/o fatigue and completed stand 4 step transfer with measured careful steps... daughter able to cue mother on proper, safe technique to step back toward chair til back of legs touched chair and to reach back for w/c arm rests.  W/c to bed transfer= max A...Marland KitchenMarland Kitchenpatient required assist to don both legs onto bed  Bed mobility=moderate assistance  Patient and daughter educated on various techniques for bilateral hands sensation, coordination and strength work as various media (stress ball, digging small team items buried in small yellow sponge, stereognosis, and others) as dtr explained mom states both hand have decreased sensation the intermittently occur  Patient left in very supportive care of dtr with call bell and phone within reach.  Continue Patient plan of care     Therapy Documentation Precautions:  Precautions Precautions: Fall Restrictions Weight Bearing Restrictions: No  Pain:denied    Therapy/Group: Individual Therapy  Alfredia Ferguson [facility] 04/04/2019, 6:02 PM

## 2019-04-04 NOTE — Progress Notes (Signed)
[place] PHYSICAL MEDICINE & REHABILITATION PROGRESS NOTE   Subjective/Complaints: No new issues today  ROS: Limited due to cognitive/behavioral   Objective:   No results found. Recent Labs    04/02/19 0353 04/03/19 0556  WBC 6.4 5.2  HGB 10.3* 10.2*  HCT 30.2* 30.2*  PLT 71* 67*   Recent Labs    04/02/19 0353 04/03/19 0556  NA 142 141  K 4.1 3.9  CL 110 109  CO2 22 24  GLUCOSE 146* 136*  BUN 30* 27*  CREATININE 1.45* 1.24*  CALCIUM 9.0 8.8*    Intake/Output Summary (Last 24 hours) at 04/04/2019 1032 Last data filed at 04/04/2019 0900 Gross per 24 hour  Intake 440 ml  Output -  Net 440 ml     Physical Exam: Vital Signs Blood pressure 136/65, pulse 60, temperature 98.8 F (37.1 C), temperature source Oral, resp. rate 17, SpO2 96 %.  Constitutional: No distress . Vital signs reviewed. HEENT: EOMI, oral membranes moist Neck: supple Cardiovascular: RRR without murmur. No JVD    Respiratory: CTA Bilaterally without wheezes or rales. Normal effort    GI: BS +, non-tender, non-distended  Musculoskeletal:     Comments: No edema or tenderness in extremities  Neurological:  Slow processing Left facial weakness. No tongue deviation She was able to follow simple one step motor commands.  Motor: Right upper extremity: 5/5 proximal distal Left upper extremity: 2+ to 3-/5 proximal distal Right lower extremity: 4+/5 proximal distal Left lower extremity:3/5 proximal to distal Dysarthric Mild left inattention. Mild left leg sensory deficit LT Skin: Skin is warm and dry. She is not diaphoretic.  Psychiatric: flat but cooperative  Assessment/Plan: 1. Functional deficits secondary to right thalamic infarct which require 3+ hours per day of interdisciplinary therapy in a comprehensive inpatient rehab setting.  Physiatrist is providing close team supervision and 24 hour management of active medical problems listed below.  Physiatrist and rehab team continue to  assess barriers to discharge/monitor patient progress toward functional and medical goals  Care Tool:  Bathing    Body parts bathed by patient: Right arm, Left arm, Chest, Abdomen, Front perineal area, Right upper leg, Left upper leg, Face   Body parts bathed by helper: Buttocks, Right lower leg, Left lower leg     Bathing assist Assist Level: Moderate Assistance - Patient 50 - 74%     Upper Body Dressing/Undressing Upper body dressing   What is the patient wearing?: Pull over shirt    Upper body assist Assist Level: Moderate Assistance - Patient 50 - 74%    Lower Body Dressing/Undressing Lower body dressing      What is the patient wearing?: Pants     Lower body assist Assist for lower body dressing: Total Assistance - Patient < 25%     Toileting Toileting    Toileting assist Assist for toileting: Total Assistance - Patient < 25%     Transfers Chair/bed transfer  Transfers assist     Chair/bed transfer assist level: Maximal Assistance - Patient 25 - 49%     Locomotion Ambulation   Ambulation assist   Ambulation activity did not occur: Safety/medical concerns          Walk 10 feet activity   Assist  Walk 10 feet activity did not occur: Safety/medical concerns        Walk 50 feet activity   Assist Walk 50 feet with 2 turns activity did not occur: Safety/medical concerns  Walk 150 feet activity   Assist Walk 150 feet activity did not occur: Safety/medical concerns         Walk 10 feet on uneven surface  activity   Assist Walk 10 feet on uneven surfaces activity did not occur: Safety/medical concerns         Wheelchair     Assist Will patient use wheelchair at discharge?: Yes Type of Wheelchair: Manual    Wheelchair assist level: Total Assistance - Patient < 25% Max wheelchair distance: 77'    Wheelchair 50 feet with 2 turns activity    Assist    Wheelchair 50 feet with 2 turns activity did not  occur: Safety/medical concerns       Wheelchair 150 feet activity     Assist  Wheelchair 150 feet activity did not occur: Safety/medical concerns       Blood pressure 136/65, pulse 60, temperature 98.8 F (37.1 C), temperature source Oral, resp. rate 17, SpO2 96 %.  Medical Problem List and Plan: 1.  Functional deficits  secondary to Right thalamic stroke             --Continue CIR therapies including PT, OT, and SLP   2.  Antithrombotics: -DVT/anticoagulation:  Mechanical:  Antiembolism stockings, knee (TED hose) Bilateral lower extremities             -antiplatelet therapy: Low dose ASA, will DC if platelets trend less than 50. 3. Pain Management: Tylenol prn  4. Mood: LCSW to follow for evaluation and support.              -antipsychotic agents: N/a 5. Neuropsych: This patient is not fully capable of making decisions on her own behalf. 6. Skin/Wound Care: Routine pressure relief measures.  7. Fluids/Electrolytes/Nutrition: encourage PO   .    -BUN/Cr improved--recheck monday  -protein supp for low albumin 8. HTN: Monitor BP tid and avoid hypoperfusion. Continue to hold Cozaar and HCTZ.              monitor 9. T2DM with hyperglycemia: Hgb A1C- 8.5. Blood sugars still labile---will monitor BS ac/hs. Was on Amaryl and metformin PTA--  -continue Amaryl.   -Discontinued metformin due to CKD.   -titrate amaryl as needed.   -fair control at present 10. Myelodysplasia with acute on chronic thrombocytopenia: Platelets on downward trend 91-->71--> 67 today  -serial CBC's  -no clinical signs of bleeding  -hgb stable at 10.2  11. Acute on chronic renal failure: BUN on rise--encourage fluid intake. Monitor with serial checks.              as above bun/cr better today  -recheck monday. 12. Hypomagnesemia: 1.4 yesterday               Mag level not ordered  -supplement  -check monday 13. Dyslipidemia: On Crestor.    LOS: 2 days A FACE TO FACE EVALUATION WAS  PERFORMED  Meredith Staggers 04/04/2019, 10:32 AM

## 2019-04-04 NOTE — Progress Notes (Signed)
Physical Therapy Session Note  Patient Details  Name: Stacey Harris MRN: [id number] Date of Birth: December 29, 1928  Today's Date: 04/04/2019 PT Individual Time:  -      Short Term Goals: Week 1:  PT Short Term Goal 1 (Week 1): Pt will ambulate 10' w/ mod assist w/ LRAD PT Short Term Goal 2 (Week 1): Pt will self-propel w/c 50' w/ mod assist PT Short Term Goal 3 (Week 1): Pt will perform bed<>chair transfer w/ mod assist PT Short Term Goal 4 (Week 1): Pt will maintain dynamic standing balance w/ mod assist  Skilled Therapeutic Interventions/Progress Updates:     Patient in bathroom on [facility] with NT upon PT arrival. Patient alert and agreeable to PT session. Patient denied pain throughout session, except a minor headache. Patient refused pain medicine, RN made aware. PT provided rest breaks and distraction for pain interventions throughout session. Vitals in sitting: BP 133/107 HR 56 SPO2 99%, readjusted cuff and took vitals again due to elevated diastolic BP. Vitals: BP 125/114 HR 81 (manual HR 70 bpm with irregular rhythm). Transported patient to [place], Therapist, sports, made aware of vitals and she took manual BP 106/70. Patient denied any symptoms. Continued with therapy monitoring patient closely.   Therapeutic Activity: Transfers: Patient performed sit to/from stand x3 with mod-min A using a RW and a toilet transfer x1. Provided verbal cues for hand placement on the RW, reaching back to sit for safety/controlled descent, and leaning forward to stand. Required total A for peri-care and LB dressing, provided clean brief and paper scrub pants.  Gait Training:  Patient ambulated 22 feet using RW with min-mod A with manual facilitation for R weight shift and L LE advancement from the hip. Ambulated with L circumducting gait with decreased step length on the L, retraction of the pelvis on the L, decreased weight shift to the R, and 1-2 steps to advance L LE with poor foot clearance. Provided verbal cues for R  weight shift, L LE advancement, and sequencing.  Wheelchair Mobility:  Patient propelled wheelchair 10 feet with min-mod A with B UEs, limited use of L UE and limited distance due to fatigue. Provided verbal cues for propulsion technique and use of L UE.   Therapeutic Exercise: Patient performed the following exercises with verbal and tactile cues for proper technique. -Seated marching 2x10 -standing marching 2x10 -B LAQ 2x10  Patient in recliner at end of session with breaks locked, seat belt alarm set, and all needs within reach. Patient remained asymptomatic and HR WFL with activity for remainder of session.   Therapy Documentation Precautions:  Precautions Precautions: Fall Restrictions Weight Bearing Restrictions: No    Therapy/Group: Individual Therapy  Terryn Redner L Torrion Witter PT, DPT  04/04/2019, 7:38 AM

## 2019-04-04 NOTE — Progress Notes (Signed)
Speech Language Pathology Daily Session Note  Patient Details  Name: AZORIA ABTS MRN: [id number] Date of Birth: 1928/11/04  Today's Date: 04/04/2019 SLP Individual Time: 1100-1150 SLP Individual Time Calculation (min): 50 min and Today's Date: 04/04/2019 SLP Missed Time: 10 Minutes Missed Time Reason: Patient fatigue  Short Term Goals: Week 1: SLP Short Term Goal 1 (Week 1): Patient will consume current diet with minimal overt s/s of aspiration with supervision level verbal cues for use of swallowing compensatory strategies. SLP Short Term Goal 2 (Week 1): Patient will utilize external aids for recall of functional information with Mod A verbal and visual cues. SLP Short Term Goal 3 (Week 1): Patient will demonstrate basic problem solving for functional and familair tasks with Min A verbal cues.  Skilled Therapeutic Interventions: Skilled treatment session focused on cognitive goals. Upon arrival, patient appeared lethargic and reported fatigue due to not sleeping well last night. SLP facilitated session by providing external aids to maximize recall of place and date in which she was able to utilize with overall Min A verbal cues and extra time. SLP also facilitated session by providing Max A verbal and visual cues with extra time for patient to utilize her schedule efficiently in order to anticipate upcoming sessions and recall previous therapy sessions. Patient falling asleep towards end of session, therefore, session ended 10 minutes early due to fatigue. Patient left semi-reclined in the recliner with alarm on and all needs within reach. Continue with current plan of care.      Pain Pain Assessment Pain Scale: 0-10 Pain Score: 0-No pain  Therapy/Group: Individual Therapy  Oanh Devivo 04/04/2019, 10:52 AM

## 2019-04-05 ENCOUNTER — Inpatient Hospital Stay (HOSPITAL_COMMUNITY): Payer: Medicare HMO | Admitting: Speech Pathology

## 2019-04-05 LAB — GLUCOSE, CAPILLARY
Glucose-Capillary: 147 mg/dL — ABNORMAL HIGH (ref 70–99)
Glucose-Capillary: 153 mg/dL — ABNORMAL HIGH (ref 70–99)
Glucose-Capillary: 151 mg/dL — ABNORMAL HIGH (ref 70–99)
Glucose-Capillary: 175 mg/dL — ABNORMAL HIGH (ref 70–99)

## 2019-04-05 MED ORDER — GLIMEPIRIDE 2 MG PO TABS
6.0000 mg | ORAL_TABLET | Freq: Every day | ORAL | Status: DC
  Administered 2019-04-06 – 2019-04-08 (×3): 6 mg via ORAL
  Filled 2019-04-05 (×4): qty 3

## 2019-04-05 NOTE — Progress Notes (Signed)
[place] PHYSICAL MEDICINE & REHABILITATION PROGRESS NOTE   Subjective/Complaints: Up in bed. Finished breakfast just before I came in  ROS: Limited due to cognitive/behavioral    Objective:   No results found. Recent Labs    04/03/19 0556  WBC 5.2  HGB 10.2*  HCT 30.2*  PLT 67*   Recent Labs    04/03/19 0556  NA 141  K 3.9  CL 109  CO2 24  GLUCOSE 136*  BUN 27*  CREATININE 1.24*  CALCIUM 8.8*    Intake/Output Summary (Last 24 hours) at 04/05/2019 0851 Last data filed at 04/05/2019 0730 Gross per 24 hour  Intake 819 ml  Output -  Net 819 ml     Physical Exam: Vital Signs Blood pressure 120/60, pulse 88, temperature 97.7 F (36.5 C), resp. rate 18, SpO2 97 %.  Constitutional: No distress . Vital signs reviewed. HEENT: EOMI, oral membranes moist Neck: supple Cardiovascular: RRR without murmur. No JVD    Respiratory: CTA Bilaterally without wheezes or rales. Normal effort    GI: BS +, non-tender, non-distended  Musculoskeletal:     Comments: No edema or tenderness in extremities  Neurological:  Slow processing. Limited insight Left facial weakness. No tongue deviation She was able to follow simple one step motor commands.  Motor: Right upper extremity: 5/5 proximal distal Left upper extremity: 2+ to 3-/5 proximal distal Right lower extremity: 4+/5 proximal distal Left lower extremity:3/5 proximal to distal Dysarthric Mild left inattention. Mild left leg sensory deficit LT Skin: Skin is warm and dry. She is not diaphoretic.  Psychiatric: flat  Assessment/Plan: 1. Functional deficits secondary to right thalamic infarct which require 3+ hours per day of interdisciplinary therapy in a comprehensive inpatient rehab setting.  Physiatrist is providing close team supervision and 24 hour management of active medical problems listed below.  Physiatrist and rehab team continue to assess barriers to discharge/monitor patient progress toward functional and  medical goals  Care Tool:  Bathing    Body parts bathed by patient: Right arm, Left arm, Chest, Abdomen, Front perineal area, Right upper leg, Left upper leg, Face   Body parts bathed by helper: Buttocks, Right lower leg, Left lower leg     Bathing assist Assist Level: Moderate Assistance - Patient 50 - 74%     Upper Body Dressing/Undressing Upper body dressing   What is the patient wearing?: Pull over shirt    Upper body assist Assist Level: Moderate Assistance - Patient 50 - 74%    Lower Body Dressing/Undressing Lower body dressing      What is the patient wearing?: Pants     Lower body assist Assist for lower body dressing: Total Assistance - Patient < 25%     Toileting Toileting    Toileting assist Assist for toileting: Moderate Assistance - Patient 50 - 74%     Transfers Chair/bed transfer  Transfers assist     Chair/bed transfer assist level: Maximal Assistance - Patient 25 - 49%     Locomotion Ambulation   Ambulation assist   Ambulation activity did not occur: Safety/medical concerns          Walk 10 feet activity   Assist  Walk 10 feet activity did not occur: Safety/medical concerns        Walk 50 feet activity   Assist Walk 50 feet with 2 turns activity did not occur: Safety/medical concerns         Walk 150 feet activity   Assist Walk 150 feet activity  did not occur: Safety/medical concerns         Walk 10 feet on uneven surface  activity   Assist Walk 10 feet on uneven surfaces activity did not occur: Safety/medical concerns         Wheelchair     Assist Will patient use wheelchair at discharge?: Yes Type of Wheelchair: Manual    Wheelchair assist level: Total Assistance - Patient < 25% Max wheelchair distance: 31'    Wheelchair 50 feet with 2 turns activity    Assist    Wheelchair 50 feet with 2 turns activity did not occur: Safety/medical concerns       Wheelchair 150 feet activity      Assist  Wheelchair 150 feet activity did not occur: Safety/medical concerns       Blood pressure 120/60, pulse 88, temperature 97.7 F (36.5 C), resp. rate 18, SpO2 97 %.  Medical Problem List and Plan: 1.  Functional deficits  secondary to Right thalamic stroke             --Continue CIR therapies including PT, OT, and SLP   2.  Antithrombotics: -DVT/anticoagulation:  Mechanical:  Antiembolism stockings, knee (TED hose) Bilateral lower extremities             -antiplatelet therapy: Low dose ASA, will DC if platelets trend less than 50. 3. Pain Management: Tylenol prn  4. Mood: LCSW to follow for evaluation and support.              -antipsychotic agents: N/a 5. Neuropsych: This patient is not fully capable of making decisions on her own behalf. 6. Skin/Wound Care: Routine pressure relief measures.  7. Fluids/Electrolytes/Nutrition: encourage PO   .    -BUN/Cr improved--recheck monday  -protein supp for low albumin 8. HTN: Monitor BP tid and avoid hypoperfusion. Continue to hold Cozaar and HCTZ.              monitor 9. T2DM with hyperglycemia: Hgb A1C- 8.5. Blood sugars still labile---will monitor BS ac/hs. Was on Amaryl and metformin PTA--  -continue Amaryl.   -Discontinued metformin due to CKD.   -borderline control 9/27  -titrate amaryl to 6mg      10. Myelodysplasia with acute on chronic thrombocytopenia: Platelets on downward trend 91-->71--> 67 today  -serial CBC's  -no clinical signs of bleeding  -hgb stable at 10.2  11. Acute on chronic renal failure: BUN on rise--encourage fluid intake. Monitor with serial checks.              as above bun/cr better today  -recheck monday. 12. Hypomagnesemia: 1.4 yesterday               Mag level not ordered  -supplement  -check monday 13. Dyslipidemia: On Crestor.    LOS: 3 days A FACE TO FACE EVALUATION WAS PERFORMED  Meredith Staggers 04/05/2019, 8:51 AM

## 2019-04-05 NOTE — Progress Notes (Signed)
Speech Language Pathology Daily Session Note  Patient Details  Name: Stacey Harris MRN: [id number] Date of Birth: [birth year]  Today's Date: 04/05/2019 SLP Individual Time: 0830-0900 SLP Individual Time Calculation (min): 30 min  Short Term Goals: Week 1: SLP Short Term Goal 1 (Week 1): Patient will consume current diet with minimal overt s/s of aspiration with supervision level verbal cues for use of swallowing compensatory strategies. SLP Short Term Goal 2 (Week 1): Patient will utilize external aids for recall of functional information with Mod A verbal and visual cues. SLP Short Term Goal 3 (Week 1): Patient will demonstrate basic problem solving for functional and familair tasks with Min A verbal cues.  Skilled Therapeutic Interventions:  Pt was seen for skilled ST targeting cognitive goals.  Pt needed mod verbal cues to locate the calendar in her room which was to the left of her bed.  Pt also needed mod-max assist verbal and visual cues to utilize calendar to reorient to date.  Pt was able to use other environmental cues that were posted closer to her midline field of vision when seated in bed to reorient to place with min assist verbal and visual cues.  During functional conversations while looking at family pictures with SLP, pt demonstrated decreased recall of biographical information such as her children's and grandchildren's names.  She frequently would call the same people in pictures by different names over repeated attempts to name family members.   Suspect this to be related to pt's acute confusion and slowed processing versus true loss of long term information as her recall appeared to become more consistent as she was further engaged in conversations regarding previous interests and pasttimes (specifically cooking).  Pt was left in bed at the end of today's therapy session with all needs within reach and bed alarm set.  Continue per current plan of care.    Pain Pain  Assessment Pain Scale: 0-10 Pain Score: 0-No pain  Therapy/Group: Individual Therapy  Stacey Harris, Stacey Harris 04/05/2019, 9:23 AM

## 2019-04-06 ENCOUNTER — Inpatient Hospital Stay (HOSPITAL_COMMUNITY): Payer: Medicare HMO | Admitting: Speech Pathology

## 2019-04-06 ENCOUNTER — Inpatient Hospital Stay (HOSPITAL_COMMUNITY): Payer: Medicare HMO | Admitting: Occupational Therapy

## 2019-04-06 ENCOUNTER — Inpatient Hospital Stay (HOSPITAL_COMMUNITY): Payer: Medicare HMO

## 2019-04-06 LAB — GLUCOSE, CAPILLARY
Glucose-Capillary: 133 mg/dL — ABNORMAL HIGH (ref 70–99)
Glucose-Capillary: 140 mg/dL — ABNORMAL HIGH (ref 70–99)
Glucose-Capillary: 187 mg/dL — ABNORMAL HIGH (ref 70–99)
Glucose-Capillary: 385 mg/dL — ABNORMAL HIGH (ref 70–99)

## 2019-04-06 LAB — MAGNESIUM: Magnesium: 1.7 mg/dL (ref 1.7–2.4)

## 2019-04-06 LAB — BASIC METABOLIC PANEL
Anion gap: 8 (ref 5–15)
BUN: 28 mg/dL — ABNORMAL HIGH (ref 8–23)
CO2: 22 mmol/L (ref 22–32)
Calcium: 8.7 mg/dL — ABNORMAL LOW (ref 8.9–10.3)
Chloride: 111 mmol/L (ref 98–111)
Creatinine, Ser: 1.38 mg/dL — ABNORMAL HIGH (ref 0.44–1.00)
GFR calc Af Amer: 40 mL/min — ABNORMAL LOW (ref >60)
GFR calc non Af Amer: 34 mL/min — ABNORMAL LOW (ref >60)
Glucose, Bld: 151 mg/dL — ABNORMAL HIGH (ref 70–99)
Potassium: 4.1 mmol/L (ref 3.5–5.1)
Sodium: 141 mmol/L (ref 135–145)

## 2019-04-06 NOTE — Plan of Care (Signed)
  Problem: Consults Goal: Diabetes Guidelines if Diabetic/Glucose > 140 Description: If diabetic or lab glucose is > 140 mg/dl - Initiate Diabetes/Hyperglycemia Guidelines & Document Interventions  Outcome: Progressing   Problem: RH SKIN INTEGRITY Goal: RH STG SKIN FREE OF INFECTION/BREAKDOWN Description: Min assist- free skin breakdown Outcome: Progressing   Problem: RH SAFETY Goal: RH STG ADHERE TO SAFETY PRECAUTIONS W/ASSISTANCE/DEVICE Description: STG Adhere to Safety Precautions With mod Assistance/Device. Outcome: Progressing   Problem: RH COGNITION-NURSING Goal: RH STG ANTICIPATES NEEDS/CALLS FOR ASSIST W/ASSIST/CUES Description: STG Anticipates Needs/Calls for Assist With min Assistance/Cues. Outcome: Progressing

## 2019-04-06 NOTE — Progress Notes (Signed)
Occupational Therapy Session Note  Patient Details  Name: Stacey Harris MRN: [id number] Date of Birth: 04-10-1929  Today's Date: 04/06/2019 OT Individual Time: [id number] OT Individual Time Calculation (min): 76 min    Short Term Goals: Week 1:  OT Short Term Goal 1 (Week 1): Pt will complete toilet transfer with consistent mod A OT Short Term Goal 2 (Week 1): Pt will tolerate standing at the sink for 5 minutes in preparation for BADL task OT Short Term Goal 3 (Week 1): Pt will recall hemi dressing techniques with min questioning cues.  Skilled Therapeutic Interventions/Progress Updates:    Patient seated in bed, eating lunch with nurse tech.  She is pleasant and cooperative, flat affect at times, able to follow directions/cues.  Eating meal with supervision/set up and occ cues for rate and to clear left side of mouth.  Supine to SSP min a.  Good seated balance edge of bed.  Sit to stand min A.  Ambulation with RW to/from bed, toilet and w/c with min A for walker management and cues for step length left LE.  toileting with mod A for pants down/up.  LB dressing completed seated on toilet - max A for brief, mod A for pants.  UB dressing completed w/c level - max A bra, mod A for OH shirt.  Oral care - mod a for denture care, she is able to brush lower teeth with set up.  SPT w/c to/from mat table with min/mod A (no AD)  Good tolerance of unsupported sitting with peg board design copy task placed on left side of mat table - she is able to hold and manipulate 2" objects and place them with min difficulty - max cues to follow pattern as shown.  She continued with removing pegs while seated in w/c requiring greater reach and GM coordination.  SPT with RW to recliner at close of session with min A and cues for positioning.  She remained in recliner with seat belt alarm set, chair reclined and callbell/tray table in reach.    Therapy Documentation Precautions:  Precautions Precautions:  Fall Restrictions Weight Bearing Restrictions: No General:   Vital Signs:  Pain: Pain Assessment Pain Scale: 0-10 Pain Score: 0-No pain   Therapy/Group: Individual Therapy  Carlos Levering 04/06/2019, 3:18 PM

## 2019-04-06 NOTE — Progress Notes (Signed)
Speech Language Pathology Daily Session Note  Patient Details  Name: Stacey Harris MRN: [id number] Date of Birth: 1929-04-18  Today's Date: 04/06/2019 SLP Individual Time: 0730-0825 SLP Individual Time Calculation (min): 55 min  Short Term Goals: Week 1: SLP Short Term Goal 1 (Week 1): Patient will consume current diet with minimal overt s/s of aspiration with supervision level verbal cues for use of swallowing compensatory strategies. SLP Short Term Goal 2 (Week 1): Patient will utilize external aids for recall of functional information with Mod A verbal and visual cues. SLP Short Term Goal 3 (Week 1): Patient will demonstrate basic problem solving for functional and familair tasks with Min A verbal cues.  Skilled Therapeutic Interventions: Skilled treatment session focused on cognitive goals. SLP facilitated session by providing Min A verbal cues for use of small bites and a slow rate of self-feeding with breakfast meal of Dys. 3 textures with thin liquids. Patient appeared more alert this session but also demonstrated increased language of confusion requiring Max A multimodal cues for orientation to place and time and for utilization of schedule to anticipatory upcoming therapy sessions. Patient left upright in bed with alarm on and all needs within reach. Continue with current plan of care.      Pain No/Denies Pain   Therapy/Group: Individual Therapy  Stacey Harris 04/06/2019, 12:21 PM

## 2019-04-06 NOTE — Progress Notes (Signed)
Physical Therapy Session Note  Patient Details  Name: Stacey Harris MRN: [id number] Date of Birth: [birth year]  Today's Date: 04/06/2019 PT Individual Time: 0900-1000 PT Individual Time Calculation (min): 60 min   Short Term Goals: Week 1:  PT Short Term Goal 1 (Week 1): Pt will ambulate 10' w/ mod assist w/ LRAD PT Short Term Goal 2 (Week 1): Pt will self-propel w/c 50' w/ mod assist PT Short Term Goal 3 (Week 1): Pt will perform bed<>chair transfer w/ mod assist PT Short Term Goal 4 (Week 1): Pt will maintain dynamic standing balance w/ mod assist  Skilled Therapeutic Interventions/Progress Updates:   Pt resting in bed.  She denied pain but said she was tired from not sleeping well last night.  neuromuscular re-education via multimodal cues and assistance PRN: in supine- 10 x 1 bil hip internal/external rotation, L heel slides, bil hip abduction/adduction, bil lower trunk rotation; 2 x 10 cervical flexion, bil bridging.  Rolling L><R using bed features and mod assist for donning hospital pants.  R side lying> sitting with mod assist.  Sit> stand from bed with tactile cues for hand placement, mod assist to RW.  Pivot/step to w/c to R with mod assist.    Gait training iwht RW on level tile x 5' with mod assist for wt shifting, progression of LLE, advancement of RW.  Pt stated that she needed to use toilet.  Toilet transfer using wall bar, min assist.  Total assist for clothing mgt.  Brief noted to be soaked with foul smelling urine; pt urinated small amount. Estill Batten, NT arrived to complete peri care and transfer back to w/c.     Therapy Documentation Precautions:  Precautions Precautions: Fall Restrictions Weight Bearing Restrictions: No       Therapy/Group: Individual Therapy  Anda Sobotta 04/06/2019, 10:53 AM

## 2019-04-06 NOTE — Progress Notes (Signed)
PHYSICAL MEDICINE & REHABILITATION PROGRESS NOTE   Subjective/Complaints: Pt reports no issues- had a good night- "all right". Said had a BM last night- however nursing reports LBM was 9/26.  ROS: Limited due to cognitive/behavioral    Objective:   No results found. No results for input(s): WBC, HGB, HCT, PLT in the last 72 hours. Recent Labs    04/06/19 0550  NA 141  K 4.1  CL 111  CO2 22  GLUCOSE 151*  BUN 28*  CREATININE 1.38*  CALCIUM 8.7*    Intake/Output Summary (Last 24 hours) at 04/06/2019 1615 Last data filed at 04/05/2019 1826 Gross per 24 hour  Intake 240 ml  Output -  Net 240 ml     Physical Exam: Vital Signs Blood pressure 130/74, pulse (!) 58, temperature (!) 97.5 F (36.4 C), temperature source Oral, resp. rate 18, SpO2 99 %.  Constitutional: No distress . Vital signs and labs reviewed. Pt sitting up in bed; appears comfortable but confused HEENT: EOMI, oral membranes moist Neck: supple Cardiovascular: RRR without murmur. No JVD    Respiratory: CTA Bilaterally without wheezes or rales. Normal effort    GI: BS +, non-tender, non-distended  Musculoskeletal:     Comments: No edema or tenderness in extremities  Neurological:  Slow processing. Limited insight Left facial weakness. No tongue deviation She was able to follow simple one step motor commands.  Motor: Right upper extremity: 5/5 proximal distal Left upper extremity: 2+ to 3-/5 proximal distal Right lower extremity: 4+/5 proximal distal Left lower extremity:3/5 proximal to distal Dysarthric Mild left inattention. Mild left leg sensory deficit LT Skin: Skin is warm and dry. She is not diaphoretic.  Psychiatric: flat  Assessment/Plan: 1. Functional deficits secondary to right thalamic infarct which require 3+ hours per day of interdisciplinary therapy in a comprehensive inpatient rehab setting.  Physiatrist is providing close team supervision and 24 hour management of active  medical problems listed below.  Physiatrist and rehab team continue to assess barriers to discharge/monitor patient progress toward functional and medical goals  Care Tool:  Bathing    Body parts bathed by patient: Right arm, Left arm, Chest, Abdomen, Front perineal area, Right upper leg, Left upper leg, Face   Body parts bathed by helper: Buttocks, Right lower leg, Left lower leg     Bathing assist Assist Level: Moderate Assistance - Patient 50 - 74%     Upper Body Dressing/Undressing Upper body dressing   What is the patient wearing?: Pull over shirt, Bra    Upper body assist Assist Level: Moderate Assistance - Patient 50 - 74%    Lower Body Dressing/Undressing Lower body dressing      What is the patient wearing?: Pants, Incontinence brief     Lower body assist Assist for lower body dressing: Maximal Assistance - Patient 25 - 49%     Toileting Toileting    Toileting assist Assist for toileting: Moderate Assistance - Patient 50 - 74%     Transfers Chair/bed transfer  Transfers assist     Chair/bed transfer assist level: Moderate Assistance - Patient 50 - 74%     Locomotion Ambulation   Ambulation assist   Ambulation activity did not occur: Safety/medical concerns  Assist level: Moderate Assistance - Patient 50 - 74% Assistive device: Walker-rolling Max distance: 5   Walk 10 feet activity   Assist  Walk 10 feet activity did not occur: Safety/medical concerns        Walk 50 feet activity  Assist Walk 50 feet with 2 turns activity did not occur: Safety/medical concerns         Walk 150 feet activity   Assist Walk 150 feet activity did not occur: Safety/medical concerns         Walk 10 feet on uneven surface  activity   Assist Walk 10 feet on uneven surfaces activity did not occur: Safety/medical concerns         Wheelchair     Assist Will patient use wheelchair at discharge?: Yes Type of Wheelchair: Manual     Wheelchair assist level: Total Assistance - Patient < 25% Max wheelchair distance: 38'    Wheelchair 50 feet with 2 turns activity    Assist    Wheelchair 50 feet with 2 turns activity did not occur: Safety/medical concerns       Wheelchair 150 feet activity     Assist   CBG (last 3)  Recent Labs    04/05/19 2124 04/06/19 0609 04/06/19 1154  GLUCAP 175* 133* 140*    Wheelchair 150 feet activity did not occur: Safety/medical concerns       Blood pressure 130/74, pulse (!) 58, temperature (!) 97.5 F (36.4 C), temperature source Oral, resp. rate 18, SpO2 99 %.  Medical Problem List and Plan: 1.  Functional deficits  secondary to Right thalamic stroke             --Continue CIR therapies including PT, OT, and SLP   2.  Antithrombotics: -DVT/anticoagulation:  Mechanical:  Antiembolism stockings, knee (TED hose) Bilateral lower extremities             -antiplatelet therapy: Low dose ASA, will DC if platelets trend less than 50. 3. Pain Management: Tylenol prn  4. Mood: LCSW to follow for evaluation and support.              -antipsychotic agents: N/a 5. Neuropsych: This patient is not fully capable of making decisions on her own behalf. 6. Skin/Wound Care: Routine pressure relief measures.  7. Fluids/Electrolytes/Nutrition: encourage PO   .    -BUN/Cr improved--recheck monday  -protein supp for low albumin 8. HTN: Monitor BP tid and avoid hypoperfusion. Continue to hold Cozaar and HCTZ.              monitor 9. T2DM with hyperglycemia: Hgb A1C- 8.5. Blood sugars still labile---will monitor BS ac/hs. Was on Amaryl and metformin PTA--  -continue Amaryl.   -Discontinued metformin due to CKD.   -borderline control 9/27  -titrate amaryl to 6mg    -BGs improved on 9/28    10. Myelodysplasia with acute on chronic thrombocytopenia: Platelets on downward trend 91-->71--> 67 today  -serial CBC's  -no clinical signs of bleeding  -hgb stable at 10.2  11. Acute on  chronic renal failure: BUN on rise--encourage fluid intake. Monitor with serial checks.              as above bun/cr better today  -recheck monday.  Cr 1.38 up from 1.24 down from 1.45- appears close to baseline.  12. Hypomagnesemia: 1.4 yesterday  -supplement  -check Monday  9/28- Mg up to 1.7- on daily supplement 13. Dyslipidemia: On Crestor. 14. Constipation  - if no BM by tomorrow (9/29) AM, suggest ordering/using prns    LOS: 4 days A FACE TO FACE EVALUATION WAS PERFORMED  Stacey Harris 04/06/2019, 4:15 PM

## 2019-04-07 ENCOUNTER — Inpatient Hospital Stay (HOSPITAL_COMMUNITY): Payer: Medicare HMO | Admitting: Occupational Therapy

## 2019-04-07 ENCOUNTER — Inpatient Hospital Stay (HOSPITAL_COMMUNITY): Payer: Medicare HMO | Admitting: Physical Therapy

## 2019-04-07 ENCOUNTER — Inpatient Hospital Stay (HOSPITAL_COMMUNITY): Payer: Medicare HMO | Admitting: Speech Pathology

## 2019-04-07 LAB — GLUCOSE, CAPILLARY
Glucose-Capillary: 122 mg/dL — ABNORMAL HIGH (ref 70–99)
Glucose-Capillary: 204 mg/dL — ABNORMAL HIGH (ref 70–99)
Glucose-Capillary: 150 mg/dL — ABNORMAL HIGH (ref 70–99)
Glucose-Capillary: 91 mg/dL (ref 70–99)

## 2019-04-07 NOTE — Progress Notes (Signed)
Physical Therapy Session Note  Patient Details  Name: Stacey Harris MRN: [id number] Date of Birth: 17-Jan-1929  Today's Date: 04/07/2019 PT Individual Time: [id number] PT Individual Time Calculation (min): 55 min   Short Term Goals: Week 1:  PT Short Term Goal 1 (Week 1): Pt will ambulate 10' w/ mod assist w/ LRAD PT Short Term Goal 2 (Week 1): Pt will self-propel w/c 50' w/ mod assist PT Short Term Goal 3 (Week 1): Pt will perform bed<>chair transfer w/ mod assist PT Short Term Goal 4 (Week 1): Pt will maintain dynamic standing balance w/ mod assist  Skilled Therapeutic Interventions/Progress Updates:   Pt in supine and agreeable to therapy, denies pain. Supine>sit w/ min assist and min assist to don pants at EOB, total assist to bring over hips once in standing. Stand pivot to w/c w/ min-mod assist using RW. Max verbal/tactile cues for upright posture and RW management. Total assist w/c transport to/from therapy gym. Worked on standing posture and tolerance while standing to perform card matching task. Min tactile and verbal cues for upright and to maintain B knee extension w/ prolonged stance. CGA for static standing balance. Worked on gait w/ RW, ambulated 25' x4 w/ primarily min assist during straight ambulation, mod-max assist to sequence and perform turns prior to sitting. Max verbal, tactile, and manual cues for weight shifting at turns, LLE step placement, and RW management. Pt verbalized understanding that she was initiating sitting too early, however unable to correct. Attempted turning to sit in both directions, both required mod-max assist. Returned to room and ended session in w/c, all needs in reach.   Therapy Documentation Precautions:  Precautions Precautions: Fall Restrictions Weight Bearing Restrictions: No   Therapy/Group: Individual Therapy  Broughton Eppinger Clent Demark 04/07/2019, 12:17 PM

## 2019-04-07 NOTE — Progress Notes (Signed)
Occupational Therapy Session Note  Patient Details  Name: Stacey Harris MRN: [id number] Date of Birth: Sep 04, 1928  Today's Date: 04/07/2019 OT Individual Time: 3291-9166 OT Individual Time Calculation (min): 73 min   Short Term Goals: Week 1:  OT Short Term Goal 1 (Week 1): Pt will complete toilet transfer with consistent mod A OT Short Term Goal 2 (Week 1): Pt will tolerate standing at the sink for 5 minutes in preparation for BADL task OT Short Term Goal 3 (Week 1): Pt will recall hemi dressing techniques with min questioning cues.  Skilled Therapeutic Interventions/Progress Updates:    Pt greeted semi-reclined in bed taking a nap. Easy to wake, and agreeable to OT treatment session. Pt declined bathing/dressing today 2/2 being too cold and wanting to wait for daughter to bring new clothes. Pt came to sitting EOB with min A. OT provided pt with paper scrub pants and practiced LB dressing. OT educated on hemi-dressing strategies with pt needing instructional cues and min A to thread pants. Sit<>stand at EOB with RW and max cues for hand placement prior to standing. Pt needed failitation to achieve full hip/trunk extension in standing. Facilitated forced use of L UE while pulling pants up over hips. Stand-step turn to wc with RW and min A + max verbal cues for RW positioning when turning to sit. P{t brought to therapy gym and completed LB and UB thre-ex. 3 sets of 10 seated hip extension, knee extension, and hip adduction ball squeezes.  B UE coordination with ball toss activity. Worked on standing balance/endurance, as well as functional use of L UE with standing dynavision activity. Utilized upper quadrant of dynavision to facilitate hip/trunk extension to reach overhead and hit light button. Min A overall for standing balance with occasional mod A to correct posterior or lateral LOB. Pt returned to room and completed stand-pivot back to bed with verbal cues for technique and min A. Pt left  semi-reclined in bed with needs met.    Therapy Documentation Precautions:  Precautions Precautions: Fall Restrictions Weight Bearing Restrictions: No Pain:   denies pain  Therapy/Group: Individual Therapy  Valma Cava 04/07/2019, 2:49 PM

## 2019-04-07 NOTE — Progress Notes (Signed)
Speech Language Pathology Daily Session Note  Patient Details  Name: Stacey Harris MRN: [id number] Date of Birth: [birth year]  Today's Date: 04/07/2019 SLP Individual Time: 0725-0820 SLP Individual Time Calculation (min): 55 min  Short Term Goals: Week 1: SLP Short Term Goal 1 (Week 1): Patient will consume current diet with minimal overt s/s of aspiration with supervision level verbal cues for use of swallowing compensatory strategies. SLP Short Term Goal 2 (Week 1): Patient will utilize external aids for recall of functional information with Mod A verbal and visual cues. SLP Short Term Goal 3 (Week 1): Patient will demonstrate basic problem solving for functional and familair tasks with Min A verbal cues.  Skilled Therapeutic Interventions: Skilled treatment session focused on cognitive goals. SLP facilitated session by providing extra time and Mod A verbal cues for problem solving and attention to LUE during tray set-up. Patient consumed meal with Min verbal cues for use of small bites/sips and a slow rate of self-feeding. Patient also required Min verbal cues for use of external aids for orientation to place and situation and required a simplified schedule to maximize ease of use. Patient able to identify family members in pictures with extra time and Min A verbal cues. Patient left upright in bed with alarm on and all needs within reach. Continue with current plan of care.      Pain No/Denies Pain   Therapy/Group: Individual Therapy  Chery Giusto, McMullen 04/07/2019, 9:11 AM

## 2019-04-07 NOTE — Plan of Care (Signed)
  Problem: Consults Goal: RH STROKE PATIENT EDUCATION Description: See Patient Education module for education specifics  Outcome: Progressing Goal: Nutrition Consult-if indicated Outcome: Progressing Goal: Diabetes Guidelines if Diabetic/Glucose > 140 Description: If diabetic or lab glucose is > 140 mg/dl - Initiate Diabetes/Hyperglycemia Guidelines & Document Interventions  Outcome: Progressing   Problem: RH BOWEL ELIMINATION Goal: RH STG MANAGE BOWEL WITH ASSISTANCE Description: STG Manage Bowel with mod Assistance. Outcome: Progressing   Problem: RH BLADDER ELIMINATION Goal: RH STG MANAGE BLADDER WITH ASSISTANCE Description: STG Manage Bladder With mod Assistance Outcome: Progressing   Problem: RH SKIN INTEGRITY Goal: RH STG SKIN FREE OF INFECTION/BREAKDOWN Description: Min assist- free skin breakdown Outcome: Progressing Goal: RH STG ABLE TO PERFORM INCISION/WOUND CARE W/ASSISTANCE Description: STG Able To Perform Incision/Wound Care With min Assistance. Outcome: Progressing   Problem: RH SAFETY Goal: RH STG ADHERE TO SAFETY PRECAUTIONS W/ASSISTANCE/DEVICE Description: STG Adhere to Safety Precautions With mod Assistance/Device. Outcome: Progressing Goal: RH STG DECREASED RISK OF FALL WITH ASSISTANCE Description: STG Decreased Risk of Fall With mod Assistance. Outcome: Progressing   Problem: RH COGNITION-NURSING Goal: RH STG USES MEMORY AIDS/STRATEGIES W/ASSIST TO PROBLEM SOLVE Description: STG Uses Memory Aids/Strategies With min Assistance to Problem Solve. Outcome: Progressing Goal: RH STG ANTICIPATES NEEDS/CALLS FOR ASSIST W/ASSIST/CUES Description: STG Anticipates Needs/Calls for Assist With min Assistance/Cues. Outcome: Progressing   Problem: RH KNOWLEDGE DEFICIT Goal: RH STG INCREASE KNOWLEDGE OF DIABETES Outcome: Progressing Goal: RH STG INCREASE KNOWLEDGE OF HYPERTENSION Outcome: Progressing Goal: RH STG INCREASE KNOWLEDGE OF DYSPHAGIA/FLUID  INTAKE Outcome: Progressing Goal: RH STG INCREASE KNOWLEGDE OF HYPERLIPIDEMIA Outcome: Progressing Goal: RH STG INCREASE KNOWLEDGE OF STROKE PROPHYLAXIS Outcome: Progressing

## 2019-04-07 NOTE — Progress Notes (Signed)
[facility] PHYSICAL MEDICINE & REHABILITATION PROGRESS NOTE   Subjective/Complaints: No new issues. Slept well. Denies pain  ROS: Limited due to cognitive/behavioral     Objective:   No results found. No results for input(s): WBC, HGB, HCT, PLT in the last 72 hours. Recent Labs    04/06/19 0550  NA 141  K 4.1  CL 111  CO2 22  GLUCOSE 151*  BUN 28*  CREATININE 1.38*  CALCIUM 8.7*    Intake/Output Summary (Last 24 hours) at 04/07/2019 0953 Last data filed at 04/06/2019 1833 Gross per 24 hour  Intake 440 ml  Output -  Net 440 ml     Physical Exam: Vital Signs Blood pressure (!) 134/98, pulse (!) 56, temperature 98.2 F (36.8 C), resp. rate 17, SpO2 97 %.  Constitutional: No distress . Vital signs reviewed. HEENT: EOMI, oral membranes moist Neck: supple Cardiovascular: iRR without murmur. No JVD    Respiratory: CTA Bilaterally without wheezes or rales. Normal effort    GI: BS +, non-tender, non-distended  Musculoskeletal:     Comments: No edema or tenderness in extremities  Neurological:  Delayed processing Left facial weakness. No tongue deviation She was able to follow simple one step motor commands.  Motor: Right upper extremity: 5/5 proximal distal Left upper extremity: 2+ to 3-/5 proximal distal Right lower extremity: 4+/5 proximal distal Left lower extremity:3/5 proximal to distal--motor scores stable Dysarthric Left inattention Mild left leg sensory deficit LT Skin: Skin is warm and dry. She is not diaphoretic.  Psychiatric: flat but cooperative  Assessment/Plan: 1. Functional deficits secondary to right thalamic infarct which require 3+ hours per day of interdisciplinary therapy in a comprehensive inpatient rehab setting.  Physiatrist is providing close team supervision and 24 hour management of active medical problems listed below.  Physiatrist and rehab team continue to assess barriers to discharge/monitor patient progress toward functional and  medical goals  Care Tool:  Bathing    Body parts bathed by patient: Right arm, Left arm, Chest, Abdomen, Front perineal area, Right upper leg, Left upper leg, Face   Body parts bathed by helper: Buttocks, Right lower leg, Left lower leg     Bathing assist Assist Level: Moderate Assistance - Patient 50 - 74%     Upper Body Dressing/Undressing Upper body dressing   What is the patient wearing?: Pull over shirt, Bra    Upper body assist Assist Level: Moderate Assistance - Patient 50 - 74%    Lower Body Dressing/Undressing Lower body dressing      What is the patient wearing?: Pants, Incontinence brief     Lower body assist Assist for lower body dressing: Maximal Assistance - Patient 25 - 49%     Toileting Toileting    Toileting assist Assist for toileting: Moderate Assistance - Patient 50 - 74%     Transfers Chair/bed transfer  Transfers assist     Chair/bed transfer assist level: Moderate Assistance - Patient 50 - 74%     Locomotion Ambulation   Ambulation assist   Ambulation activity did not occur: Safety/medical concerns  Assist level: Moderate Assistance - Patient 50 - 74% Assistive device: Walker-rolling Max distance: 5   Walk 10 feet activity   Assist  Walk 10 feet activity did not occur: Safety/medical concerns        Walk 50 feet activity   Assist Walk 50 feet with 2 turns activity did not occur: Safety/medical concerns         Walk 150 feet activity  Assist Walk 150 feet activity did not occur: Safety/medical concerns         Walk 10 feet on uneven surface  activity   Assist Walk 10 feet on uneven surfaces activity did not occur: Safety/medical concerns         Wheelchair     Assist Will patient use wheelchair at discharge?: Yes Type of Wheelchair: Manual    Wheelchair assist level: Total Assistance - Patient < 25% Max wheelchair distance: 81'    Wheelchair 50 feet with 2 turns activity    Assist     Wheelchair 50 feet with 2 turns activity did not occur: Safety/medical concerns       Wheelchair 150 feet activity     Assist   CBG (last 3)  Recent Labs    04/06/19 1650 04/06/19 2109 04/07/19 0633  GLUCAP 187* 385* 122*    Wheelchair 150 feet activity did not occur: Safety/medical concerns       Blood pressure (!) 134/98, pulse (!) 56, temperature 98.2 F (36.8 C), resp. rate 17, SpO2 97 %.  Medical Problem List and Plan: 1.  Functional deficits  secondary to Right thalamic stroke             --Continue CIR therapies including PT, OT, and SLP   -team conference today  2.  Antithrombotics: -DVT/anticoagulation:  Mechanical:  Antiembolism stockings, knee (TED hose) Bilateral lower extremities             -antiplatelet therapy: Low dose ASA, will DC if platelets trend less than 50. 3. Pain Management: Tylenol prn  4. Mood: LCSW to follow for evaluation and support.              -antipsychotic agents: N/a 5. Neuropsych: This patient is not fully capable of making decisions on her own behalf. 6. Skin/Wound Care: Routine pressure relief measures.  7. Fluids/Electrolytes/Nutrition:    .    -BUN/Cr stable to sl increased---continue to push liquids  -protein supp for low albumin 8. HTN: Monitor BP tid and avoid hypoperfusion. Continue to hold Cozaar and HCTZ.              DBP elevated today but otherwise bp has been controlled 9. T2DM with hyperglycemia: Hgb A1C- 8.5. Blood sugars still labile---will monitor BS ac/hs. Was on Amaryl and metformin PTA--  -continue Amaryl.   -Discontinued metformin due to CKD.   -titrated amaryl to 6mg  6/27  -BGs improved on 9/28 until late elevated reading   -follow for more consistent pattern 10. Myelodysplasia with acute on chronic thrombocytopenia: Platelets on downward trend 91-->71--> 67    -serial CBC's  -no clinical signs of bleeding  -hgb stable at 10.2 most recently  -recheck Thursday 11. Acute on chronic renal failure: BUN  on rise--encourage fluid intake. Monitor with serial checks.              probably near baseline---recheck thursday 12. Hypomagnesemia: 1.4 yesterday  -supplement  -check Monday  9/28- Mg up to 1.7- continue daily supplement 13. Dyslipidemia: On Crestor. 14. Constipation  -had large bm 9./28     LOS: 5 days A FACE TO FACE EVALUATION WAS PERFORMED  Meredith Staggers 04/07/2019, 9:53 AM

## 2019-04-08 ENCOUNTER — Inpatient Hospital Stay (HOSPITAL_COMMUNITY): Payer: Medicare HMO | Admitting: Physical Therapy

## 2019-04-08 ENCOUNTER — Inpatient Hospital Stay (HOSPITAL_COMMUNITY): Payer: Medicare HMO | Admitting: Speech Pathology

## 2019-04-08 ENCOUNTER — Inpatient Hospital Stay (HOSPITAL_COMMUNITY): Payer: Medicare HMO

## 2019-04-08 LAB — URINALYSIS, COMPLETE (UACMP) WITH MICROSCOPIC
Bilirubin Urine: NEGATIVE
Glucose, UA: NEGATIVE mg/dL
Hgb urine dipstick: NEGATIVE
Ketones, ur: NEGATIVE mg/dL
Nitrite: NEGATIVE
Protein, ur: NEGATIVE mg/dL
Specific Gravity, Urine: 1.014 (ref 1.005–1.030)
WBC, UA: >50 WBC/hpf — ABNORMAL HIGH (ref 0–5)
pH: 6 (ref 5.0–8.0)

## 2019-04-08 LAB — CBC
HCT: 28.2 % — ABNORMAL LOW (ref 36.0–46.0)
Hemoglobin: 9.3 g/dL — ABNORMAL LOW (ref 12.0–15.0)
MCH: 33.7 pg (ref 26.0–34.0)
MCHC: 33 g/dL (ref 30.0–36.0)
MCV: 102.2 fL — ABNORMAL HIGH (ref 80.0–100.0)
Platelets: 79 10*3/uL — ABNORMAL LOW (ref 150–400)
RBC: 2.76 MIL/uL — ABNORMAL LOW (ref 3.87–5.11)
RDW: 14.6 % (ref 11.5–15.5)
WBC: 5.1 10*3/uL (ref 4.0–10.5)
nRBC: 0 % (ref 0.0–0.2)

## 2019-04-08 LAB — GLUCOSE, CAPILLARY
Glucose-Capillary: 102 mg/dL — ABNORMAL HIGH (ref 70–99)
Glucose-Capillary: 183 mg/dL — ABNORMAL HIGH (ref 70–99)
Glucose-Capillary: 141 mg/dL — ABNORMAL HIGH (ref 70–99)
Glucose-Capillary: 133 mg/dL — ABNORMAL HIGH (ref 70–99)

## 2019-04-08 MED ORDER — SULFAMETHOXAZOLE-TRIMETHOPRIM 800-160 MG PO TABS: 1.0000 | ORAL_TABLET | Freq: Two times a day (BID) | ORAL | Status: DC

## 2019-04-08 MED ORDER — SULFAMETHOXAZOLE-TRIMETHOPRIM 400-80 MG PO TABS
1.0000 | ORAL_TABLET | Freq: Two times a day (BID) | ORAL | Status: AC
  Administered 2019-04-08 – 2019-04-15 (×14): 1 via ORAL
  Filled 2019-04-08 (×15): qty 1

## 2019-04-08 MED ORDER — METFORMIN HCL 500 MG PO TABS
500.0000 mg | ORAL_TABLET | Freq: Every day | ORAL | Status: DC
  Administered 2019-04-09 – 2019-04-13 (×5): 500 mg via ORAL
  Filled 2019-04-08 (×5): qty 1

## 2019-04-08 NOTE — Progress Notes (Signed)
Speech Language Pathology Daily Session Note  Patient Details  Name: Stacey Harris MRN: [id number] Date of Birth: 10-22-1928  Today's Date: 04/08/2019 SLP Individual Time: 0725-0820 SLP Individual Time Calculation (min): 55 min  Short Term Goals: Week 1: SLP Short Term Goal 1 (Week 1): Patient will consume current diet with minimal overt s/s of aspiration with supervision level verbal cues for use of swallowing compensatory strategies. SLP Short Term Goal 2 (Week 1): Patient will utilize external aids for recall of functional information with Mod A verbal and visual cues. SLP Short Term Goal 3 (Week 1): Patient will demonstrate basic problem solving for functional and familair tasks with Min A verbal cues.  Skilled Therapeutic Interventions: Skilled treatment session focused on dysphagia and cognitive goals. SLP facilitated session by providing extra time and Min A verbal cues for tray set-up with breakfast meal of Dys. 3 textures with thin liquids. Patient consumed current diet without overt s/s of aspiration but required intermittent verbal cues for use of swallowing compensatory strategies. Patient independently requested to use the bathroom and required Mod verbal cues for safety and sequencing with task. Patient continent of urine. Patient independently oriented to place, month and year but required Min visual cues for orientation to date and day of the week. Max verbal cues required to utilize schedule to anticipate upcoming therapy sessions. Patient left upright in bed with alarm on and all needs within reach. Continue with current plan of care.       Pain Pain Assessment Pain Score: 0-No pain  Therapy/Group: Individual Therapy  Anitria Andon 04/08/2019, 8:22 AM

## 2019-04-08 NOTE — Progress Notes (Addendum)
PHYSICAL MEDICINE & REHABILITATION PROGRESS NOTE   Subjective/Complaints: Pt lying in bed. Denies any problems or pain. Slept well. Has noted some dysuria, odor to urine  ROS: Patient denies fever, rash, sore throat, blurred vision, nausea, vomiting, diarrhea, cough, shortness of breath or chest pain, joint or back pain, headache, or mood change.     Objective:   No results found. Recent Labs    04/08/19 0533  WBC 5.1  HGB 9.3*  HCT 28.2*  PLT 79*   Recent Labs    04/06/19 0550  NA 141  K 4.1  CL 111  CO2 22  GLUCOSE 151*  BUN 28*  CREATININE 1.38*  CALCIUM 8.7*    Intake/Output Summary (Last 24 hours) at 04/08/2019 0931 Last data filed at 04/08/2019 0429 Gross per 24 hour  Intake 460 ml  Output 67 ml  Net 393 ml     Physical Exam: Vital Signs Blood pressure (!) 139/58, pulse 72, temperature 98.2 F (36.8 C), temperature source Oral, resp. rate 18, weight 80.5 kg, SpO2 97 %.  Constitutional: No distress . Vital signs reviewed. HEENT: EOMI, oral membranes moist Neck: supple Cardiovascular: RRR without murmur. No JVD    Respiratory: CTA Bilaterally without wheezes or rales. Normal effort    GI: BS +, non-tender, non-distended  Musculoskeletal:     Comments: No edema or tenderness in extremities  Neurological:  Improved processing. More alert Left facial weakness. No tongue deviation She was able to follow simple one step motor commands.  Motor: Right upper extremity: 5/5 proximal distal Left upper extremity: 2+ to 3-/5 proximal distal Right lower extremity: 4+/5 proximal distal Left lower extremity:3/5 proximal to distal--motor scores stable Dysarthria improving.  Left inattention but initiating quickly with LUE and LLE today. Mild left leg sensory deficit LT ongoing Skin: Skin is warm and dry. She is not diaphoretic.  Psychiatric: flat but pleasant  Assessment/Plan: 1. Functional deficits secondary to right thalamic infarct which require 3+  hours per day of interdisciplinary therapy in a comprehensive inpatient rehab setting.  Physiatrist is providing close team supervision and 24 hour management of active medical problems listed below.  Physiatrist and rehab team continue to assess barriers to discharge/monitor patient progress toward functional and medical goals  Care Tool:  Bathing    Body parts bathed by patient: Right arm, Left arm, Chest, Abdomen, Front perineal area, Right upper leg, Left upper leg, Face, Buttocks, Right lower leg, Left lower leg   Body parts bathed by helper: Buttocks, Right lower leg, Left lower leg     Bathing assist Assist Level: Minimal Assistance - Patient > 75%     Upper Body Dressing/Undressing Upper body dressing   What is the patient wearing?: Pull over shirt    Upper body assist Assist Level: Supervision/Verbal cueing    Lower Body Dressing/Undressing Lower body dressing      What is the patient wearing?: Pants, Incontinence brief     Lower body assist Assist for lower body dressing: Minimal Assistance - Patient > 75%     Toileting Toileting    Toileting assist Assist for toileting: Moderate Assistance - Patient 50 - 74%     Transfers Chair/bed transfer  Transfers assist     Chair/bed transfer assist level: Minimal Assistance - Patient > 75%     Locomotion Ambulation   Ambulation assist   Ambulation activity did not occur: Safety/medical concerns  Assist level: Moderate Assistance - Patient 50 - 74% Assistive device: Walker-rolling Max distance: 62'  Walk 10 feet activity   Assist  Walk 10 feet activity did not occur: Safety/medical concerns  Assist level: Moderate Assistance - Patient - 50 - 74% Assistive device: Walker-rolling   Walk 50 feet activity   Assist Walk 50 feet with 2 turns activity did not occur: Safety/medical concerns         Walk 150 feet activity   Assist Walk 150 feet activity did not occur: Safety/medical  concerns         Walk 10 feet on uneven surface  activity   Assist Walk 10 feet on uneven surfaces activity did not occur: Safety/medical concerns         Wheelchair     Assist Will patient use wheelchair at discharge?: Yes Type of Wheelchair: Manual    Wheelchair assist level: Total Assistance - Patient < 25% Max wheelchair distance: 90'    Wheelchair 50 feet with 2 turns activity    Assist    Wheelchair 50 feet with 2 turns activity did not occur: Safety/medical concerns       Wheelchair 150 feet activity     Assist   CBG (last 3)  Recent Labs    04/07/19 1701 04/07/19 2108 04/08/19 0615  GLUCAP 150* 91 102*    Wheelchair 150 feet activity did not occur: Safety/medical concerns       Blood pressure (!) 139/58, pulse 72, temperature 98.2 F (36.8 C), temperature source Oral, resp. rate 18, weight 80.5 kg, SpO2 97 %.  Medical Problem List and Plan: 1.  Functional deficits  secondary to Right thalamic stroke             --Continue CIR therapies including PT, OT, and SLP   -making steady gains. Baseline cognitive deficts  2.  Antithrombotics: -DVT/anticoagulation:  Mechanical:  Antiembolism stockings, knee (TED hose) Bilateral lower extremities             -antiplatelet therapy: Low dose ASA, will DC if platelets trend less than 50. 3. Pain Management: Tylenol prn  4. Mood: LCSW to follow for evaluation and support.              -antipsychotic agents: N/a 5. Neuropsych: This patient is not fully capable of making decisions on her own behalf. 6. Skin/Wound Care: Routine pressure relief measures.  7. Fluids/Electrolytes/Nutrition:    .    -BUN/Cr stable to sl increased---continue to push liquids  -continue protein supp for low albumin 8. HTN: Monitor BP tid and avoid hypoperfusion. Continue to hold Cozaar and HCTZ.              DBP elevated today but otherwise bp has been controlled 9. T2DM with hyperglycemia: Hgb A1C- 8.5. Blood sugars  still labile---will monitor BS ac/hs. Was on Amaryl and metformin PTA--  -Discontinued metformin due to CKD.   -titrated amaryl to 6mg  6/27  -BGs improving and leveling out    10. Myelodysplasia with acute on chronic thrombocytopenia: Platelets on downward trend 91-->71--> 67    -serial CBC's  -no clinical signs of bleeding  -hgb stable at 10.2 most recently  -recheck Thursday 11. Acute on chronic renal failure: BUN on rise--encourage fluid intake. Monitor with serial checks.              probably near baseline---recheck thursday 12. Hypomagnesemia: 1.4 yesterday  -supplement  -check Monday  9/28- Mg up to 1.7- continue daily supplement 13. Dyslipidemia: On Crestor. 14. Constipation  -had large bms 9./29  15. Dysuria: check ua/ucx  LOS: 6 days A FACE TO FACE EVALUATION WAS PERFORMED  Meredith Staggers 04/08/2019, 9:31 AM

## 2019-04-08 NOTE — Progress Notes (Signed)
Occupational Therapy Session Note  Patient Details  Name: Stacey Harris MRN: [id number] Date of Birth: February 12, 1929  Today's Date: 04/08/2019 OT Individual Time: 0845-1000 OT Individual Time Calculation (min): 75 min    Short Term Goals: Week 1:  OT Short Term Goal 1 (Week 1): Pt will complete toilet transfer with consistent mod A OT Short Term Goal 2 (Week 1): Pt will tolerate standing at the sink for 5 minutes in preparation for BADL task OT Short Term Goal 3 (Week 1): Pt will recall hemi dressing techniques with min questioning cues.  Skilled Therapeutic Interventions/Progress Updates:    Pt received supine with no c/o pain. Pt completed bed mobility to EOB with HOB at 13 degrees with (S), use of bed rails for support. Pt completed stand pivot transfer with min A to w/c with min cueing for hand placement. Pt sat at sink and bathed UB with (S). Cueing for sequencing required and to fully complete L sided tasks, I.e. leaving shirt hanging on L arm and not pulling pants up all the way over L foot. Pt donned shirt with (S). CGA to don pants, with pt requiring increased time to sequence not using hemi techniques and putting L LE in last. Pt able to stand at sink for peri hygiene with CGA for balance support. No physical assist required for any bathing. Pt completed oral care with set up assist required to use LUE as a stabilizer instead of for [facility] to take off toothpaste cap. Pt able to complete oral hygiene with min cueing. Pt was transported to the therapy gym with total A and she completed stand pivot transfer to the mat with min A. Pt sat on a wedge to increase anterior pelvic weight shift. Pt used LUE to place clothespins on above head target. Focused on hand prehension, shoulder flexion, and gross grasp strength. Pt required increased rest breaks throughout session. Pt returned to her w/c and was taken back to her room. Pt was left sitting up in the w/c with chair alarm belt fastened, all needs  met. Pt's socks were changed to larger size d/t indentation in ankles from edema.   Therapy Documentation Precautions:  Precautions Precautions: Fall Restrictions Weight Bearing Restrictions: No   Therapy/Group: Individual Therapy  Curtis Sites 04/08/2019, 7:16 AM

## 2019-04-08 NOTE — Progress Notes (Signed)
Physical Therapy Session Note  Patient Details  Name: Stacey Harris MRN: [id number] Date of Birth: 1928-11-05  Today's Date: 04/08/2019 PT Individual Time: 1300-1410 PT Individual Time Calculation (min): 70 min   Short Term Goals: Week 1:  PT Short Term Goal 1 (Week 1): Pt will ambulate 10' w/ mod assist w/ LRAD PT Short Term Goal 2 (Week 1): Pt will self-propel w/c 50' w/ mod assist PT Short Term Goal 3 (Week 1): Pt will perform bed<>chair transfer w/ mod assist PT Short Term Goal 4 (Week 1): Pt will maintain dynamic standing balance w/ mod assist  Skilled Therapeutic Interventions/Progress Updates:   Pt in w/c and agreeable to therapy, no c/o pain. Total assist w/c transport to/from therapy gym. Worked on gait training and endurance w/ ambulation. Ambulated 25' x4 w/ min assist overall, min verbal, tactile, and visual cues for sequencing step placement and RW management w/ turning to sit. Pt c/o increasing L foot soreness, TTP at dorsal surface of foot. Pt does not rate, RN made aware. Per pt, she has L foot soreness PTA. Performed BLE strengthening exercises in seated as listed below. Tactile, verbal, and visual cues throughout for technique, counting, and fluidity of movement, LLE>RLE. Pt w/ decreased work of breathing w/ therapy today compared to last session with this therapist, required less frequent and shorter rest breaks w/ gait and LE therex. Pt reported needing to toilet. Returned to room and performed min-mod stand pivot to toilet, pt w/ continent void. Total assist for LE garment management. Ambulated to EOB w/ min assist using RW, needed more mod cues to turn to sit on bed. Static sitting balance w/ close supervision and verbal/tactile cues for upright posture, pt w/ L lateral lean w/ fatigue. Ended session sitting EOB and in care of daughter, all needs met.   BLE strengthening:  -LAQs 3x10 -knee marches 3x10 -heel slides 2x10  -heel raises 3x10  -toes raises 3x10  -adduction  squeezes 1x10   Therapy Documentation Precautions:  Precautions Precautions: Fall Restrictions Weight Bearing Restrictions: No  Therapy/Group: Individual Therapy  Cris Gibby K Tighe Gitto 04/08/2019, 2:11 PM

## 2019-04-09 ENCOUNTER — Inpatient Hospital Stay (HOSPITAL_COMMUNITY): Payer: Medicare HMO | Admitting: Physical Therapy

## 2019-04-09 ENCOUNTER — Inpatient Hospital Stay (HOSPITAL_COMMUNITY): Payer: Medicare HMO | Admitting: Speech Pathology

## 2019-04-09 ENCOUNTER — Inpatient Hospital Stay (HOSPITAL_COMMUNITY): Payer: Medicare HMO | Admitting: Occupational Therapy

## 2019-04-09 LAB — GLUCOSE, CAPILLARY
Glucose-Capillary: 103 mg/dL — ABNORMAL HIGH (ref 70–99)
Glucose-Capillary: 216 mg/dL — ABNORMAL HIGH (ref 70–99)
Glucose-Capillary: 218 mg/dL — ABNORMAL HIGH (ref 70–99)
Glucose-Capillary: 129 mg/dL — ABNORMAL HIGH (ref 70–99)

## 2019-04-09 LAB — CBC
HCT: 27.6 % — ABNORMAL LOW (ref 36.0–46.0)
Hemoglobin: 8.9 g/dL — ABNORMAL LOW (ref 12.0–15.0)
MCH: 33.2 pg (ref 26.0–34.0)
MCHC: 32.2 g/dL (ref 30.0–36.0)
MCV: 103 fL — ABNORMAL HIGH (ref 80.0–100.0)
Platelets: 89 10*3/uL — ABNORMAL LOW (ref 150–400)
RBC: 2.68 MIL/uL — ABNORMAL LOW (ref 3.87–5.11)
RDW: 14.6 % (ref 11.5–15.5)
WBC: 4.7 10*3/uL (ref 4.0–10.5)
nRBC: 0 % (ref 0.0–0.2)

## 2019-04-09 LAB — BASIC METABOLIC PANEL
Anion gap: 6 (ref 5–15)
BUN: 22 mg/dL (ref 8–23)
CO2: 22 mmol/L (ref 22–32)
Calcium: 8.7 mg/dL — ABNORMAL LOW (ref 8.9–10.3)
Chloride: 112 mmol/L — ABNORMAL HIGH (ref 98–111)
Creatinine, Ser: 1.2 mg/dL — ABNORMAL HIGH (ref 0.44–1.00)
GFR calc Af Amer: 47 mL/min — ABNORMAL LOW (ref >60)
GFR calc non Af Amer: 41 mL/min — ABNORMAL LOW (ref >60)
Glucose, Bld: 112 mg/dL — ABNORMAL HIGH (ref 70–99)
Potassium: 4 mmol/L (ref 3.5–5.1)
Sodium: 140 mmol/L (ref 135–145)

## 2019-04-09 MED ORDER — GLIMEPIRIDE 2 MG PO TABS
4.0000 mg | ORAL_TABLET | Freq: Every day | ORAL | Status: DC
  Administered 2019-04-09 – 2019-04-18 (×10): 4 mg via ORAL
  Filled 2019-04-09 (×9): qty 2

## 2019-04-09 NOTE — Progress Notes (Signed)
Occupational Therapy Session Note  Patient Details  Name: Stacey Harris MRN: [id number] Date of Birth: 07-15-1928  Today's Date: 04/09/2019 OT Individual Time: 1415-1455 OT Individual Time Calculation (min): 40 min   Short Term Goals: Week 2:  OT Short Term Goal 1 (Week 2): LTG=STG 2/2 ELOS  Skilled Therapeutic Interventions/Progress Updates:   Pt greeted semi-reclined in bed with daughter present opening mail. Pt agreeable to OT treatment session. Pt reported need to go to the bathroom. Pt came to sitting EOB with increased time and verbal cues to get feet all the way on the floor. Sit<>stand w/ RW and min A with verbal cues for hand placement on RW. Pt ambulated into bathroom w/ min A. Pt able to manage clothing with CGA for balance prior to sitting on commode. Pt with continent void of bladder. Pt completed peri-care with supervision and verbal cues to locate toilet paper on L side. Pt ambulated out of bathroom and OT educated on RW positioning at the sink. Pt stood at the sink with verbal cues to locate soap on L side. Pt washed hands, combed hair, and washed face with CGA for balance. Pt took seated rest break in wc. Pt brought to therapy gym and completed graded clothes pin activity focused on L grip strength. Pt needed max multimodal cues to use L hand only for activity. Had pt copy simple 2 color pattern with clothes pins with pt needed mod instructional cues to complete pattern accurately. Pt completed 3 sets of 10 bicep curls and straight arm raises using 1 lb dowel rod. Pt returned to room at end of session and left seated in wc with alarm belt on and daughter present, awaiting next therapy.     Therapy Documentation Precautions:  Precautions Precautions: Fall Restrictions Weight Bearing Restrictions: No Pain:  denies pain  Therapy/Group: Individual Therapy  Valma Cava 04/09/2019, 2:59 PM

## 2019-04-09 NOTE — Progress Notes (Signed)
[facility] PHYSICAL MEDICINE & REHABILITATION PROGRESS NOTE   Subjective/Complaints: No new issues today. Up with therapy. Gradually progressing  ROS: Patient denies fever, rash, sore throat, blurred vision, nausea, vomiting, diarrhea, cough, shortness of breath or chest pain, joint or back pain, headache, or mood change.    Objective:   No results found. Recent Labs    04/08/19 0533 04/09/19 0646  WBC 5.1 4.7  HGB 9.3* 8.9*  HCT 28.2* 27.6*  PLT 79* 89*   Recent Labs    04/09/19 0646  NA 140  K 4.0  CL 112*  CO2 22  GLUCOSE 112*  BUN 22  CREATININE 1.20*  CALCIUM 8.7*    Intake/Output Summary (Last 24 hours) at 04/09/2019 1055 Last data filed at 04/08/2019 1756 Gross per 24 hour  Intake 340 ml  Output -  Net 340 ml     Physical Exam: Vital Signs Blood pressure (!) 128/59, pulse (!) 57, temperature 98.8 F (37.1 C), temperature source Oral, resp. rate 18, weight 80.5 kg, SpO2 96 %.  Constitutional: No distress . Vital signs reviewed. HEENT: EOMI, oral membranes moist Neck: supple Cardiovascular: RRR without murmur. No JVD    Respiratory: CTA Bilaterally without wheezes or rales. Normal effort    GI: BS +, non-tender, non-distended  Musculoskeletal:     Comments: No edema or tenderness in extremities  Neurological:  Improved processing. More alert Left facial weakness. No tongue deviation She was able to follow simple one step motor commands.  Motor: Right upper extremity: 5/5 proximal distal Left upper extremity: 2+ to 3-/5 proximal distal Right lower extremity: 4+/5 proximal distal Left lower extremity:3/5 proximal to distal--motor scores stable Dysarthria improving.  Left inattention but initiating quickly with LUE and LLE today. Mild left leg sensory deficit LT ongoing Skin: Skin is warm and dry. She is not diaphoretic.  Psychiatric: cooperative  Assessment/Plan: 1. Functional deficits secondary to right thalamic infarct which require 3+ hours  per day of interdisciplinary therapy in a comprehensive inpatient rehab setting.  Physiatrist is providing close team supervision and 24 hour management of active medical problems listed below.  Physiatrist and rehab team continue to assess barriers to discharge/monitor patient progress toward functional and medical goals  Care Tool:  Bathing    Body parts bathed by patient: Right arm, Left arm, Chest, Abdomen, Front perineal area, Right upper leg, Left upper leg, Face, Buttocks, Right lower leg, Left lower leg   Body parts bathed by helper: Buttocks, Right lower leg, Left lower leg     Bathing assist Assist Level: Minimal Assistance - Patient > 75%     Upper Body Dressing/Undressing Upper body dressing   What is the patient wearing?: Pull over shirt    Upper body assist Assist Level: Supervision/Verbal cueing    Lower Body Dressing/Undressing Lower body dressing      What is the patient wearing?: Pants, Incontinence brief     Lower body assist Assist for lower body dressing: Minimal Assistance - Patient > 75%     Toileting Toileting    Toileting assist Assist for toileting: Moderate Assistance - Patient 50 - 74%     Transfers Chair/bed transfer  Transfers assist     Chair/bed transfer assist level: Minimal Assistance - Patient > 75%     Locomotion Ambulation   Ambulation assist   Ambulation activity did not occur: Safety/medical concerns  Assist level: Minimal Assistance - Patient > 75% Assistive device: Walker-rolling Max distance: 25'   Walk 10 feet activity  Assist  Walk 10 feet activity did not occur: Safety/medical concerns  Assist level: Minimal Assistance - Patient > 75% Assistive device: Walker-rolling   Walk 50 feet activity   Assist Walk 50 feet with 2 turns activity did not occur: Safety/medical concerns         Walk 150 feet activity   Assist Walk 150 feet activity did not occur: Safety/medical concerns         Walk  10 feet on uneven surface  activity   Assist Walk 10 feet on uneven surfaces activity did not occur: Safety/medical concerns         Wheelchair     Assist Will patient use wheelchair at discharge?: Yes Type of Wheelchair: Manual    Wheelchair assist level: Total Assistance - Patient < 25% Max wheelchair distance: 11'    Wheelchair 50 feet with 2 turns activity    Assist    Wheelchair 50 feet with 2 turns activity did not occur: Safety/medical concerns       Wheelchair 150 feet activity     Assist   CBG (last 3)  Recent Labs    04/08/19 1703 04/08/19 2316 04/09/19 0641  GLUCAP 141* 133* 103*    Wheelchair 150 feet activity did not occur: Safety/medical concerns       Blood pressure (!) 128/59, pulse (!) 57, temperature 98.8 F (37.1 C), temperature source Oral, resp. rate 18, weight 80.5 kg, SpO2 96 %.  Medical Problem List and Plan: 1.  Functional deficits  secondary to Right thalamic stroke             --Continue CIR therapies including PT, OT, and SLP   -continued gains.  Baseline cognitive deficts   -ELOS 10/10 2.  Antithrombotics: -DVT/anticoagulation:  Mechanical:  Antiembolism stockings, knee (TED hose) Bilateral lower extremities             -antiplatelet therapy: Low dose ASA, will DC if platelets trend less than 50. (89k today 10/1) 3. Pain Management: Tylenol prn  4. Mood: LCSW to follow for evaluation and support.              -antipsychotic agents: N/a 5. Neuropsych: This patient is not fully capable of making decisions on her own behalf. 6. Skin/Wound Care: Routine pressure relief measures.  7. Fluids/Electrolytes/Nutrition:    .    -continue protein supp for low albumin  -I personally reviewed the patient's labs today.    -bun/cr improved 8. HTN: Monitor BP tid and avoid hypoperfusion. Continue to hold Cozaar and HCTZ.              DBP elevated today but otherwise bp has been controlled 9. T2DM with hyperglycemia: Hgb A1C-  8.5. Blood sugars still labile---will monitor BS ac/hs. Was on Amaryl and metformin PTA--  -metformin resumed and changed to qd per family request   -titrated amaryl to 6mg  6/27  -BGs improving in general    10. Myelodysplasia with acute on chronic thrombocytopenia: Platelets on downward trend 91-->71--> 67    -serial CBC's  -no clinical signs of bleeding  -hgb 9.3.[name]8.9 today,  -recheck Monday 11. Acute on chronic renal failure: BUN on rise--encourage fluid intake. Monitor with serial checks.              probably near baseline---recheck thursday 12. Hypomagnesemia: 1.4 yesterday  -supplement  9/28- Mg up to 1.7- continue daily supplement 13. Dyslipidemia: On Crestor. 14. Constipation  -had large bms 9./29 /30 15. Dysuria: ua +  -ucx  pending  -empiric bactrim started    LOS: 7 days A FACE TO FACE EVALUATION WAS PERFORMED  Meredith Staggers 04/09/2019, 10:55 AM

## 2019-04-09 NOTE — Patient Care Conference (Signed)
Inpatient RehabilitationTeam Conference and Plan of Care Update Date: 04/07/2019   Time: 10:55 AM    Patient Name: Stacey Harris      Medical Record Number: [id number]  Date of Birth: 03/30/1929 Sex: Female         Room/Bed: 4W17C/4W17C-01 Payor Info: Payor: [employer] MEDICARE / Plan: [place] HMO / Product Type: *No Product type* /    Admit Date/Time:  04/02/2019  4:58 PM  Primary Diagnosis:  Right thalamic stroke [facility])  Patient Active Problem List   Diagnosis Date Noted  . Right thalamic stroke ([place]) 04/02/2019  . Stroke of right basal ganglia ([place]) 04/02/2019  . Basal ganglia stroke ([place])   . AKI (acute kidney injury) ([place])   . Hypomagnesemia   . Uncontrolled type 2 diabetes mellitus with hyperglycemia ([place])   . Stroke ([place]) 03/30/2019  . CKD (chronic kidney disease) 03/30/2019  . Recurrent UTI (urinary tract infection) 09/17/2018  . Fall at home 09/17/2018  . Frequent urination 04/29/2018  . Elevated serum creatinine 04/11/2018  . Dysuria 11/12/2017  . SOB (shortness of breath) on exertion 11/12/2017  . History of fall 08/27/2016  . Controlled type 2 diabetes mellitus with diabetic autonomic neuropathy ([place]) 02/24/2016  . Routine general medical examination at a health care facility 02/12/2016  . MDS (myelodysplastic syndrome) ([place]) 10/24/2015  . Normocytic anemia 02/11/2015  . GERD (gastroesophageal reflux disease) 08/06/2014  . Encounter for Medicare annual wellness exam 08/03/2013  . Thrombocytopenia ([place]) 08/03/2013  . Insomnia 01/26/2013  . Hypothyroid 09/17/2011  . Lumbar spinal stenosis 06/21/2011    Class: Present on Admission  . BACK PAIN, LUMBAR 08/23/2010  . POSTNASAL DRIP 08/23/2010  . OSTEOARTHRITIS, HIP 03/16/2009  . BASAL CELL CARCINOMA, FACE 07/01/2007  . BASAL CELL CARCINOMA, FACE 07/01/2007  . DIABETIC PERIPHERAL NEUROPATHY 09/24/2006  . Hyperlipidemia 09/24/2006  . Essential hypertension 09/24/2006  . ROSACEA 09/24/2006  . INCONTINENCE,  URGE 09/24/2006  . COLONOSCOPY AND REMOVAL OF LESION, HX OF 06/09/2003    Expected Discharge Date: Expected Discharge Date: 04/18/19  Team Members Present: Physician leading conference: Dr. Alger Simons Social Worker Present: Lennart Pall, LCSW Nurse Present: Ellison Carwin, LPN PT Present: Burnard Bunting, PT OT Present: Cherylynn Ridges, OT SLP Present: Weston Anna, SLP PPS Coordinator present : Ileana Ladd, PT     Current Status/Progress Goal Weekly Team Focus  Bowel/Bladder   Patient is incont. of bladder and bowel, unable to tell when this has an occurrance,, LBM 04/04/19, Patient is currently on schedule and unschedule medications  Continent of B/B  QS/PRN Q2h toileting assess   Swallow/Nutrition/ Hydration   Dys. 3 textures with thin liquids, Full supervision  Mod I  baseline diet, working on use of swallowing strategies   ADL's   Min A overall, occasional Mod A  Supervision/CGA goals  self-care retraining, activity tolerance, dc planning, pt/family education, balance   Mobility   min-mod assist overall for transfers and gait w/ RW x25'  supervision-CGA w/c level, min assist gait (will likely upgrade gait to supervision-CGA as well)  balance, sequencing of functional tasks, endurance, LE strengthening   Communication   Supervision-Min A  Mod I  use of speech intelligibility strategies   Safety/Cognition/ Behavioral Observations  Supervision  Mod A  orientation with use of external aids, basic problem solving   Pain      denies pain , continue to assess QA/PRN  QS/PRN assessment and monitoring of discomfort   Skin   Abraion/bruising/ecchyomotic areas to abdomen, hands, and buttocks  areas, open to air  No new reported skin issues while on [facility]  Assess QS/PRN  paient skin closely for issues and report new issues/concerns to MD and team    Rehab Goals Patient on target to meet rehab goals: Yes *See Care Plan and progress notes for long and short-term goals.     Barriers to  Discharge  Current Status/Progress Possible Resolutions Date Resolved   Nursing                  PT                    OT                  SLP                SW                Discharge Planning/Teaching Needs:  Plan home with family to provide 24/7 assistance  Teaching needs TBD   Team Discussion:  No significant med issues;  incont b/b. Confusion.  Min assist overall with PT/OT and S goals.  D3 thin and baseline diet and cognition per ST.  Revisions to Treatment Plan:  NA    Medical Summary Current Status: right thalamic infarct, acute on chronic renal failure, bp control, diabetes labile Weekly Focus/Goal: manage issues above  Barriers to Discharge: Medical stability       Continued Need for Acute Rehabilitation Level of Care: The patient requires daily medical management by a physician with specialized training in physical medicine and rehabilitation for the following reasons: Direction of a multidisciplinary physical rehabilitation program to maximize functional independence : Yes Medical management of patient stability for increased activity during participation in an intensive rehabilitation regime.: Yes Analysis of laboratory values and/or radiology reports with any subsequent need for medication adjustment and/or medical intervention. : Yes   I attest that I was present, lead the team conference, and concur with the assessment and plan of the team.   Michela Herst 04/09/2019, 10:02 AM   Team conference was held via web/ teleconference due to [place] - 19

## 2019-04-09 NOTE — Progress Notes (Signed)
Physical Therapy Session Note  Patient Details  Name: Stacey Harris MRN: [id number] Date of Birth: October 11, 1928  Today's Date: 04/09/2019 PT Individual Time: 1115-1155 PT Individual Time Calculation (min): 40 min   Short Term Goals: Week 1:  PT Short Term Goal 1 (Week 1): Pt will ambulate 10' w/ mod assist w/ LRAD PT Short Term Goal 2 (Week 1): Pt will self-propel w/c 50' w/ mod assist PT Short Term Goal 3 (Week 1): Pt will perform bed<>chair transfer w/ mod assist PT Short Term Goal 4 (Week 1): Pt will maintain dynamic standing balance w/ mod assist  Skilled Therapeutic Interventions/Progress Updates:   Pt in w/c and agreeable to therapy, denies pain. Total assist w/c transport to/from therapy gym. Worked on Personnel officer w/ RW, ambulated 80' x2 w/ min assist. Verbal and tactile cues for RW management and step placement w/ turns, overall continues to improve session-to-session. Stand pivot to NuStep w/ RW, min assist, much improved since previous attempt at transferring w/ RW. NuStep 4 min x2 @ level 1 w/ all extremities to work on global endurance and strengthening. Min verbal and tactile cues throughout for fluidity of movement and attention to LUE. Worked on moderately-complex peg board task, pt needed mod-max verbal and visual cues for sequencing, error-correction, and problem solving. Returned to room and ended session in w/c, all needs in reach.   Therapy Documentation Precautions:  Precautions Precautions: Fall Restrictions Weight Bearing Restrictions: No  Therapy/Group: Individual Therapy  Derrisha Foos K Reni Hausner 04/09/2019, 12:00 PM

## 2019-04-09 NOTE — Progress Notes (Signed)
Occupational Therapy Session Note  Patient Details  Name: Stacey Harris MRN: [id number] Date of Birth: 1928-12-12  Today's Date: 04/09/2019 OT Individual Time: 1459-1536 OT Individual Time Calculation (min): 37 min    Skilled Therapeutic Interventions/Progress Updates:    Pt worked on LUE FM coordination and reach during session.  Therapist rolled pt down to the therapy gym to the high low table where she began session in standing while picking up and placing resistive clothespins with the LUE.  She was able to complete the red and the yellow ones with increased time and slight increased forward lean in standing.  Transitioned to sitting where she worked on picking up the 1" round wooden pegs and placing them in the grid.  Pt with moderate difficulty picking them up and manipulating them for tip to tip placement.  She placed approximately 25 pegs in 8-9 mins.  Finished session with return to the room and transfer back to the bed with min assist.  Pt's daughter present at end of session with call button and phone in reach and safety bed alarm in place.      Therapy Documentation Precautions:  Precautions Precautions: Fall Restrictions Weight Bearing Restrictions: No  Pain: Pain Assessment Pain Scale: Faces Pain Score: 0-No pain ADL: See Care Tool Section for some details of ADL tasks  Therapy/Group: Individual Therapy  Devanny Palecek OTR/L 04/09/2019, 4:41 PM

## 2019-04-09 NOTE — Progress Notes (Signed)
Social Work Patient ID: Stacey Harris, female   DOB: Jun 16, 1929, 85 y.o.   MRN: [id number]  Have reviewed team conference with pt and daughter.  Both agreeable with targeted d/c date of 10/10 and supervision goals overall.  Daughter with concerns about pt's incont b/b.  Notes she did wear depends PTA for bladder urgency.  Notes that she feels medication may be cause for bowel incont now - have informed MD/PA.  Will continue to follow.  Thatiana Renbarger, LCSW

## 2019-04-09 NOTE — Progress Notes (Signed)
Speech Language Pathology Daily Session Note  Patient Details  Name: Stacey Harris MRN: [id number] Date of Birth: 1929-04-18  Today's Date: 04/09/2019 SLP Individual Time: 0725-0820 SLP Individual Time Calculation (min): 55 min  Short Term Goals: Week 1: SLP Short Term Goal 1 (Week 1): Patient will consume current diet with minimal overt s/s of aspiration with supervision level verbal cues for use of swallowing compensatory strategies. SLP Short Term Goal 2 (Week 1): Patient will utilize external aids for recall of functional information with Mod A verbal and visual cues. SLP Short Term Goal 3 (Week 1): Patient will demonstrate basic problem solving for functional and familair tasks with Min A verbal cues.  Skilled Therapeutic Interventions: Skilled treatment session focused on dysphagia and cognitive goals. SLP facilitated session by providing extra time and Min A verbal cues for tray set-up with breakfast meal of Dys. 3 textures with thin liquids. Patient consumed current diet without overt s/s of aspiration but required intermittent verbal cues for use of swallowing compensatory strategies. SLP also facilitated session by providing Min A verbal cues for scanning/attention to left while reading aloud at the paragraph level. Patient independently oriented to place, month and year but required Min visual cues for orientation to date and day of the week. Mod verbal cues required to utilize simplified schedule to anticipate upcoming therapy sessions. Patient left upright in bed with alarm on and all needs within reach. Continue with current plan of care.       Pain No/Denies Pain   Therapy/Group: Individual Therapy  Amera Banos 04/09/2019, 2:01 PM

## 2019-04-09 NOTE — Progress Notes (Signed)
Occupational Therapy Weekly Progress Note  Patient Details  Name: Stacey Harris MRN: [id number] Date of Birth: 1928-12-04  Beginning of progress report period: April 03, 2019 End of progress report period: April 09, 2019  Today's Date: 04/09/2019 OT Individual Time: 6270-3500 OT Individual Time Calculation (min): 60 min    Patient has met 2 of 3 short term goals.  Pt is making steady progress towards OT goals at this time. Pt can complete functional transfers with min/mod A overall. She has demonstrated improved standing balance and endurance. Pt continues to have mild L hemiplegia and L inattention affecting BADL tasks. Continue current POC.  Patient continues to demonstrate the following deficits: muscle weakness, impaired timing and sequencing, motor apraxia, ataxia, decreased coordination and decreased motor planning, decreased midline orientation, decreased attention to left and decreased motor planning, decreased initiation, decreased awareness, decreased problem solving, decreased safety awareness, decreased memory and delayed processing and decreased sitting balance, decreased standing balance, decreased postural control, hemiplegia and decreased balance strategies and therefore will continue to benefit from skilled OT intervention to enhance overall performance with BADL and Reduce care partner burden.  Patient progressing toward long term goals..  Continue plan of care.  OT Short Term Goals Week 1:  OT Short Term Goal 1 (Week 1): Pt will complete toilet transfer with consistent mod A OT Short Term Goal 1 - Progress (Week 1): Met OT Short Term Goal 2 (Week 1): Pt will tolerate standing at the sink for 5 minutes in preparation for BADL task OT Short Term Goal 2 - Progress (Week 1): Met OT Short Term Goal 3 (Week 1): Pt will recall hemi dressing techniques with min questioning cues. OT Short Term Goal 3 - Progress (Week 1): Progressing toward goal Week 2:  OT Short Term Goal 1  (Week 2): LTG=STG 2/2 ELOS  Skilled Therapeutic Interventions/Progress Updates:     Pt greeted semi-reclined in bed and agreeable to OT treatment session. OT asked pt if she needed to go to the bathroom and pt stated she already went in her brief. OT asked pt why she did not call for assistance to get to the bathroom and she said " they don't come in time." Pt declined to shower today, but agreeable to change clothes and wash bottom. Pt came to sitting EOB with increased time and min A to get L hip forward. Sit<>stand at EOB with min A and verbal cues for hand placement. OT assisted with doffing soiled brief, then pt able to wash peri-are and buttocks with min A for balance. OT then applied new clean brief. Pt returned to sitting with CGA. Pt unable to recall hemi-dressing techniques and required verbal cues to dress weaker L side of body first. Pt was able to thread pant legs, then needed min A to stand and min A to pull pants over hips. Worked on L fine motor coordination with buttoning shirt. Pt able to button 2 buttons with increased time and effort. Stand-pivot to wc with RW and min A.  Pt brought to therapy gym and worked on L fine motor coordination with finding small beads placed in foam block. Engaged pt in standing checkers activity with focus on problem solving, balance, and functional use of L UE. Pt needed max multimodal cues just to recall which color she was playing and could not remember rules of the game often playing out of turn and jumping pieces all over the board. Worked on L hand coordination with large buttoning task and string  tying activity. Pt returned to room and left seated in wc with alarm belt on call bell in reach, and needs met.   Therapy Documentation Precautions:  Precautions Precautions: Fall Restrictions Weight Bearing Restrictions: No Pain:  denies pain  Therapy/Group: Individual Therapy  Valma Cava 04/09/2019, 10:01 AM

## 2019-04-10 ENCOUNTER — Inpatient Hospital Stay (HOSPITAL_COMMUNITY): Payer: Medicare HMO | Admitting: Physical Therapy

## 2019-04-10 ENCOUNTER — Inpatient Hospital Stay (HOSPITAL_COMMUNITY): Payer: Medicare HMO | Admitting: Speech Pathology

## 2019-04-10 ENCOUNTER — Inpatient Hospital Stay (HOSPITAL_COMMUNITY): Payer: Medicare HMO | Admitting: Occupational Therapy

## 2019-04-10 LAB — GLUCOSE, CAPILLARY
Glucose-Capillary: 114 mg/dL — ABNORMAL HIGH (ref 70–99)
Glucose-Capillary: 150 mg/dL — ABNORMAL HIGH (ref 70–99)
Glucose-Capillary: 126 mg/dL — ABNORMAL HIGH (ref 70–99)
Glucose-Capillary: 148 mg/dL — ABNORMAL HIGH (ref 70–99)

## 2019-04-10 NOTE — Plan of Care (Signed)
  Problem: RH Bed to Chair Transfers Goal: LTG Patient will perform bed/chair transfers w/assist (PT) Description: LTG: Patient will perform bed to chair transfers with assistance (PT). Flowsheets (Taken 04/10/2019 1139) LTG: Pt will perform Bed to Chair Transfers with assistance level: (goal upgraded 10/2 due to pt progress - AAT) Supervision/Verbal cueing Note: goal upgraded 10/2 due to pt progress - AAT   Problem: RH Ambulation Goal: LTG Patient will ambulate in controlled environment (PT) Description: LTG: Patient will ambulate in a controlled environment, # of feet with assistance (PT). Flowsheets Taken 04/10/2019 1139 LTG: Pt will ambulate in controlled environ  assist needed:: (goal upgraded 10/2 due to pt progress - AAT) Contact Guard/Touching assist Taken 04/03/2019 1438 LTG: Ambulation distance in controlled environment: 38' w/ LRAD Note: goal upgraded 10/2 due to pt progress - AAT Goal: LTG Patient will ambulate in home environment (PT) Description: LTG: Patient will ambulate in home environment, # of feet with assistance (PT). Flowsheets Taken 04/10/2019 1139 LTG: Pt will ambulate in home environ  assist needed:: (goal upgraded 10/2 due to pt progress - AAT) Contact Guard/Touching assist Taken 04/03/2019 1438 LTG: Ambulation distance in home environment: 25' w/ LRAD Note: goal upgraded 10/2 due to pt progress - AAT

## 2019-04-10 NOTE — Progress Notes (Signed)
Occupational Therapy Session Note  Patient Details  Name: Stacey Harris MRN: [id number] Date of Birth: 08-Oct-1928  Today's Date: 04/10/2019 OT Individual Time: 8295-6213 OT Individual Time Calculation (min): 60 min   Short Term Goals: Week 2:  OT Short Term Goal 1 (Week 2): LTG=STG 2/2 ELOS  Skilled Therapeutic Interventions/Progress Updates:    Pt greeted sitting in wc and agreeable to OT treatment session focused on self-care retraining. Pt agreeable to shower today. Pt doffed clothing from wc with verbal cues and min A for bra clasps and to remove pants off of feet. Stand-pivot into shower onto bench with grab bars and CGA. Provided pt with LH sponge for improved access to wash LB. Bathing completed with overall CGA for balance when standing to wash buttocks. Min cues to use L UE during bathing tasks for neuro re-ed. Worked on dressing tasks from [employer] with verbal cues to recall hemi-dressing techniques. Forced use of L UE to pull pants up over hips at the sink. Noted swelling in LEs, so OT donned TED hose with OT providing education on edema management and circulation. Worked on L UE strength within hair drying task. Pt needed OT assistance for 50% of activity 2.2 UE fatigue.Stand-pivot back to bed at end of session with verbal cues for hand placement and CGA. Pt left sem-reclined in bed with bed alarm on and needs met.   Therapy Documentation Precautions:  Precautions Precautions: Fall Restrictions Weight Bearing Restrictions: No Pain:  denies pain   Therapy/Group: Individual Therapy  Daneen Schick Ainara Eldridge 04/10/2019, 10:14 AM

## 2019-04-10 NOTE — Progress Notes (Signed)
Physical Therapy Weekly Progress Note  Patient Details  Name: Stacey Harris MRN: [id number] Date of Birth: March 24, 1929  Beginning of progress report period: April 03, 2019 End of progress report period: April 10, 2019  Today's Date: 04/10/2019 PT Individual Time: 1100-1155 PT Individual Time Calculation (min): 55 min   Patient has met 3 of 4 short term goals. Pt is making good progress towards LTGs, performing all mobility w/ CGA-min assist including bed mobility, transfers, and gait up to 50' w/ RW. She continues to require min cues for L attention, sequencing of functional tasks, and for safety awareness. Although, per daughter and SLP, this is almost her baseline.   Patient continues to demonstrate the following deficits muscle weakness, decreased cardiorespiratoy endurance, unbalanced muscle activation, decreased coordination and decreased motor planning, decreased attention to left, decreased problem solving, decreased safety awareness, decreased memory and delayed processing and decreased standing balance, hemiplegia and decreased balance strategies and therefore will continue to benefit from skilled PT intervention to increase functional independence with mobility.  Patient progressing toward long term goals..  Continue plan of care. LTGs upgraded to supervision-CGA overall 2/2 pt progress.   PT Short Term Goals Week 1:  PT Short Term Goal 1 (Week 1): Pt will ambulate 10' w/ mod assist w/ LRAD PT Short Term Goal 1 - Progress (Week 1): Met PT Short Term Goal 2 (Week 1): Pt will self-propel w/c 50' w/ mod assist PT Short Term Goal 2 - Progress (Week 1): Not met PT Short Term Goal 3 (Week 1): Pt will perform bed<>chair transfer w/ mod assist PT Short Term Goal 3 - Progress (Week 1): Met PT Short Term Goal 4 (Week 1): Pt will maintain dynamic standing balance w/ mod assist PT Short Term Goal 4 - Progress (Week 1): Met Week 2:  PT Short Term Goal 1 (Week 2): =LTGs due to  ELOS  Skilled Therapeutic Interventions/Progress Updates:   Pt in supine and agreeable to therapy, no c/o pain. Supine>sit w/ CGA and CGA stand pivot to w/c w/ RW. No cues needed for RW management and step placement. Total assist w/c transport to/from therapy gym. Attempted to have pt self-propel w/c, remains non-functional at this time 2/2 L inattention and decreased BUE strength. Worked on standing tolerance and balance w/ intermittent UE support while performing simple peg board tasks. Close supervision-CGA for balance, no cues needed to maintain B knee extension in prolonged stance today, >2-3 min at a time. Pt self-selecting to utilize LUE more to work on LUE fine motor control. Sit<>stands from w/c to high-low table w/ supervision, occasional verbal cues to push up w/ BUEs. Pt performed peg board task w/ only occasional verbal cues for error correction. Performed multiple bouts of standing. Returned to room at one point 2/2 urinary urgency, min assist toilet transfer and pt continent of void. Returned to room and ended session in w/c, all needs in reach.   Therapy Documentation Precautions:  Precautions Precautions: Fall Restrictions Weight Bearing Restrictions: No Vital Signs:   Therapy/Group: Individual Therapy  Alrick Cubbage Clent Demark 04/10/2019, 12:46 PM

## 2019-04-10 NOTE — Progress Notes (Signed)
Speech Language Pathology Weekly Progress and Session Note  Patient Details  Name: Stacey Harris MRN: [id number] Date of Birth: 12-22-1928  Beginning of progress report period: April 03, 2019 End of progress report period: April 10, 2019  Today's Date: 04/10/2019 SLP Individual Time: 0725-0820 SLP Individual Time Calculation (min): 55 min  Short Term Goals: Week 1: SLP Short Term Goal 1 (Week 1): Patient will consume current diet with minimal overt s/s of aspiration with supervision level verbal cues for use of swallowing compensatory strategies. SLP Short Term Goal 1 - Progress (Week 1): Not met SLP Short Term Goal 2 (Week 1): Patient will utilize external aids for recall of functional information with Mod A verbal and visual cues. SLP Short Term Goal 2 - Progress (Week 1): Met SLP Short Term Goal 3 (Week 1): Patient will demonstrate basic problem solving for functional and familair tasks with Min A verbal cues. SLP Short Term Goal 3 - Progress (Week 1): Not met    New Short Term Goals: Week 2: SLP Short Term Goal 1 (Week 2): Patient will consume current diet with minimal overt s/s of aspiration with supervision level verbal cues for use of swallowing compensatory strategies. SLP Short Term Goal 2 (Week 2): Patient will utilize external aids for recall of functional information with Min A verbal and visual cues. SLP Short Term Goal 3 (Week 2): Patient will demonstrate basic problem solving for functional and familair tasks with Min A verbal cues.  Weekly Progress Updates: Patient has made functional but inconsistent gains and has met 1 of 3 STGs this reporting period. Currently, patient continues to require Min A verbal cues for use of swallowing compensatory strategies to maximize safety with current diet. Patient demonstrates improved orientation and utilization of schedule to anticipate upcoming therapy appointments requiring overall Mod A multimodal cues. Mod A verbal cues are  also required for basic problem solving for safety. Patient and family;ly education ongoing. Patient would benefit from continued skilled SLP intervention to maximize her cognitive and swallowing function prior to discharge.      Intensity: Minumum of 1-2 x/day, 30 to 90 minutes Frequency: 3 to 5 out of 7 days Duration/Length of Stay: 04/18/19 Treatment/Interventions: Cognitive remediation/compensation;Internal/external aids;Speech/Language facilitation;Dysphagia/aspiration precaution training;Cueing hierarchy;Environmental controls;Therapeutic Activities;Functional tasks;Patient/family education   Daily Session  Skilled Therapeutic Interventions:  Skilled treatment session focused on dysphagia and cognitive goals. Upon arrival, patient was incontinent of urine and required Max A verbal cues for problem solving and sequencing with transfer and clothing management while changing her pants and brief. Patient consumed breakfast meal of Dys. 3 textures with thin liquids without overt s/s of aspiration and supervision level verbal cues for use of small bites and a slow rate of self-feeding. SLP also facilitated session by providing Min A question cues for patient to identify potential safety hazards in pictures and to generate alternate and safer solutions. Patient left upright in wheelchair with all needs within reach and alarm on. Continue with current plan of care.     Pain No/Denies Pain  Therapy/Group: Individual Therapy  Darrien Laakso 04/10/2019, 6:50 AM

## 2019-04-10 NOTE — Progress Notes (Signed)
[facility] PHYSICAL MEDICINE & REHABILITATION PROGRESS NOTE   Subjective/Complaints: Pt in bed. Just up with SLP. Had a slight loss of balance, hit head on hand rail apparently. Pt denies pain or any sequelae  ROS: Patient denies fever, rash, sore throat, blurred vision, nausea, vomiting, diarrhea, cough, shortness of breath or chest pain, joint or back pain, headache, or mood change.   Objective:   No results found. Recent Labs    04/08/19 0533 04/09/19 0646  WBC 5.1 4.7  HGB 9.3* 8.9*  HCT 28.2* 27.6*  PLT 79* 89*   Recent Labs    04/09/19 0646  NA 140  K 4.0  CL 112*  CO2 22  GLUCOSE 112*  BUN 22  CREATININE 1.20*  CALCIUM 8.7*    Intake/Output Summary (Last 24 hours) at 04/10/2019 1047 Last data filed at 04/10/2019 0803 Gross per 24 hour  Intake 720 ml  Output -  Net 720 ml     Physical Exam: Vital Signs Blood pressure 128/67, pulse (!) 53, temperature 98.6 F (37 C), resp. rate 18, weight 80.5 kg, SpO2 97 %.  Constitutional: No distress . Vital signs reviewed. HEENT: EOMI, oral membranes moist. No bumps, bruises, tenderness seen Neck: supple Cardiovascular: RRR without murmur. No JVD    Respiratory: CTA Bilaterally without wheezes or rales. Normal effort    GI: BS +, non-tender, non-distended   Musculoskeletal:     Comments: No edema or tenderness in extremities  Neurological:  Mild left central 7. More attentive, quicker processing  Motor: Right upper extremity: 5/5 proximal distal Left upper extremity: 2+ to 3-/5 proximal distal Right lower extremity: 4+/5 proximal distal Left lower extremity:3/5 proximal to distal--motor scores stable Dysarthria improving.  Initiating more with LUE and LLE. Ongoing LUE sensory loss Skin: Skin is warm and dry. She is not diaphoretic.  Psychiatric: pleasant, a little flat  Assessment/Plan: 1. Functional deficits secondary to right thalamic infarct which require 3+ hours per day of interdisciplinary therapy in a  comprehensive inpatient rehab setting.  Physiatrist is providing close team supervision and 24 hour management of active medical problems listed below.  Physiatrist and rehab team continue to assess barriers to discharge/monitor patient progress toward functional and medical goals  Care Tool:  Bathing    Body parts bathed by patient: Right arm, Left arm, Chest, Abdomen, Front perineal area, Right upper leg, Left upper leg, Face, Buttocks, Right lower leg, Left lower leg   Body parts bathed by helper: Buttocks, Right lower leg, Left lower leg     Bathing assist Assist Level: Minimal Assistance - Patient > 75%     Upper Body Dressing/Undressing Upper body dressing   What is the patient wearing?: Pull over shirt    Upper body assist Assist Level: Supervision/Verbal cueing    Lower Body Dressing/Undressing Lower body dressing      What is the patient wearing?: Pants, Incontinence brief     Lower body assist Assist for lower body dressing: Minimal Assistance - Patient > 75%     Toileting Toileting    Toileting assist Assist for toileting: Moderate Assistance - Patient 50 - 74%     Transfers Chair/bed transfer  Transfers assist     Chair/bed transfer assist level: Minimal Assistance - Patient > 75%     Locomotion Ambulation   Ambulation assist   Ambulation activity did not occur: Safety/medical concerns  Assist level: Minimal Assistance - Patient > 75% Assistive device: Walker-rolling Max distance: 40'   Walk 10 feet activity  Assist  Walk 10 feet activity did not occur: Safety/medical concerns  Assist level: Minimal Assistance - Patient > 75% Assistive device: Walker-rolling   Walk 50 feet activity   Assist Walk 50 feet with 2 turns activity did not occur: Safety/medical concerns         Walk 150 feet activity   Assist Walk 150 feet activity did not occur: Safety/medical concerns         Walk 10 feet on uneven surface   activity   Assist Walk 10 feet on uneven surfaces activity did not occur: Safety/medical concerns         Wheelchair     Assist Will patient use wheelchair at discharge?: Yes Type of Wheelchair: Manual    Wheelchair assist level: Total Assistance - Patient < 25% Max wheelchair distance: 65'    Wheelchair 50 feet with 2 turns activity    Assist    Wheelchair 50 feet with 2 turns activity did not occur: Safety/medical concerns       Wheelchair 150 feet activity     Assist   CBG (last 3)  Recent Labs    04/09/19 1635 04/09/19 2144 04/10/19 0632  GLUCAP 218* 129* 114*    Wheelchair 150 feet activity did not occur: Safety/medical concerns       Blood pressure 128/67, pulse (!) 53, temperature 98.6 F (37 C), resp. rate 18, weight 80.5 kg, SpO2 97 %.  Medical Problem List and Plan: 1.  Functional deficits  secondary to Right thalamic stroke             -Continue CIR therapies including PT, OT, and SLP   -continued gains.  Baseline cognitive deficts   -ELOS 10/10 2.  Antithrombotics: -DVT/anticoagulation:  Mechanical:  Antiembolism stockings, knee (TED hose) Bilateral lower extremities             -antiplatelet therapy: Low dose ASA, will DC if platelets trend less than 50. (89k today 10/1) 3. Pain Management: Tylenol prn  4. Mood: LCSW to follow for evaluation and support.              -antipsychotic agents: N/a 5. Neuropsych: This patient is not fully capable of making decisions on her own behalf. 6. Skin/Wound Care: Routine pressure relief measures.  7. Fluids/Electrolytes/Nutrition:    .    -continue protein supp for low albumin   -bun/cr improved---recheck Monday 8. HTN: Monitor BP tid and avoid hypoperfusion. Continue to hold Cozaar and HCTZ.             - improved control 9. T2DM with hyperglycemia: Hgb A1C- 8.5. Blood sugars still labile---will monitor BS ac/hs. Was on Amaryl and metformin PTA--  -metformin resumed and changed to qd per  family request   -titrated amaryl to 6mg  6/27  -BGs improved 10/2    10. Myelodysplasia with acute on chronic thrombocytopenia: Platelets on downward trend 91-->71--> 67    -serial CBC's  -no clinical signs of bleeding  -hgb 9.3.-->.8.9  10/1  -recheck Monday 11. Acute on chronic renal failure: BUN on rise--encourage fluid intake. Monitor with serial checks.              probably near baseline---recheck thursday 12. Hypomagnesemia: 1.4 yesterday  -supplement  9/28- Mg up to 1.7- continue daily supplement 13. Dyslipidemia: On Crestor. 14. Constipation  -having daily BM's 15. E Coli UTI:  -sensitive to bactrim--continue for 7 days   LOS: 8 days A FACE TO FACE EVALUATION WAS PERFORMED  Meredith Staggers  04/10/2019, 10:47 AM

## 2019-04-11 ENCOUNTER — Inpatient Hospital Stay (HOSPITAL_COMMUNITY): Payer: Medicare HMO | Admitting: Speech Pathology

## 2019-04-11 LAB — GLUCOSE, CAPILLARY
Glucose-Capillary: 101 mg/dL — ABNORMAL HIGH (ref 70–99)
Glucose-Capillary: 187 mg/dL — ABNORMAL HIGH (ref 70–99)
Glucose-Capillary: 140 mg/dL — ABNORMAL HIGH (ref 70–99)
Glucose-Capillary: 205 mg/dL — ABNORMAL HIGH (ref 70–99)

## 2019-04-11 LAB — URINE CULTURE: Culture: >=100000 — AB

## 2019-04-11 NOTE — Progress Notes (Signed)
Speech Language Pathology Daily Session Note  Patient Details  Name: Stacey Harris MRN: [id number] Date of Birth: 01-Aug-1928  Today's Date: 04/11/2019 SLP Individual Time: 1405-1450 SLP Individual Time Calculation (min): 45 min  Short Term Goals: Week 2: SLP Short Term Goal 1 (Week 2): Patient will consume current diet with minimal overt s/s of aspiration with supervision level verbal cues for use of swallowing compensatory strategies. SLP Short Term Goal 2 (Week 2): Patient will utilize external aids for recall of functional information with Min A verbal and visual cues. SLP Short Term Goal 3 (Week 2): Patient will demonstrate basic problem solving for functional and familair tasks with Min A verbal cues.  Skilled Therapeutic Interventions:  Pt was seen for skilled ST targeting cognitive goals.  Upon arrival, pt was with her daughter who reported that pt had been incontinent of bowel and had been waiting for assistance to get to the bathroom.  SLP transferred pt to wheelchair and transported pt to bathroom.  RN arrived shortly thereafter and assisted in cleaning pt and donning clean brief.  Per report from pt's daughter, pt said that she had tried to use the call bell but had not actually pressed the button alerting staff.  During functional conversations with SLP, pt needed mod assist question cues to recall activities from previous ST sessions but could recall more personally meaningful information related to her previous hobbies and interests (sewing) with min question cues.  Pt was left in recliner with daughter at bedside.  Continue per current plan of care.    Pain Pain Assessment Pain Scale: 0-10 Pain Score: 0-No pain  Therapy/Group: Individual Therapy  Stacey Harris, Stacey Harris 04/11/2019, 4:53 PM

## 2019-04-11 NOTE — Progress Notes (Signed)
[facility] PHYSICAL MEDICINE & REHABILITATION PROGRESS NOTE   Subjective/Complaints:   Pt denies any issues- wants to sleep- refused to open eyes after denied issues.   ROS: Patient denies fever, rash, sore throat, blurred vision, nausea, vomiting, diarrhea, cough, shortness of breath or chest pain, joint or back pain, headache, or mood change.   Objective:   No results found. Recent Labs    04/09/19 0646  WBC 4.7  HGB 8.9*  HCT 27.6*  PLT 89*   Recent Labs    04/09/19 0646  NA 140  K 4.0  CL 112*  CO2 22  GLUCOSE 112*  BUN 22  CREATININE 1.20*  CALCIUM 8.7*    Intake/Output Summary (Last 24 hours) at 04/11/2019 1510 Last data filed at 04/11/2019 0827 Gross per 24 hour  Intake 340 ml  Output -  Net 340 ml     Physical Exam: Vital Signs Blood pressure (!) 126/98, pulse (!) 56, temperature 97.9 F (36.6 C), temperature source Oral, resp. rate 16, weight 80.5 kg, SpO2 100 %.  Constitutional: No distress . Vital signs reviewed. Lying in bed; sleepy- didn't want to talk, NAD HEENT: EOMI, oral membranes moist. No bumps, bruises, tenderness seen Neck: supple Cardiovascular: RRR without murmur. No JVD    Respiratory: CTA Bilaterally without wheezes or rales. Normal effort    GI: BS +, non-tender, non-distended   Musculoskeletal:     Comments: No edema or tenderness in extremities  Neurological:  Mild left central 7. More attentive, quicker processing  Motor: Right upper extremity: 5/5 proximal distal Left upper extremity: 2+ to 3-/5 proximal distal Right lower extremity: 4+/5 proximal distal Left lower extremity:3/5 proximal to distal--motor scores stable Dysarthria improving.  Initiating more with LUE and LLE. Ongoing LUE sensory loss Skin: Skin is warm and dry. She is not diaphoretic.  Psychiatric: pleasant, a little flat  Assessment/Plan: 1. Functional deficits secondary to right thalamic infarct which require 3+ hours per day of interdisciplinary  therapy in a comprehensive inpatient rehab setting.  Physiatrist is providing close team supervision and 24 hour management of active medical problems listed below.  Physiatrist and rehab team continue to assess barriers to discharge/monitor patient progress toward functional and medical goals  Care Tool:  Bathing    Body parts bathed by patient: Right arm, Left arm, Chest, Abdomen, Front perineal area, Right upper leg, Left upper leg, Face, Buttocks, Right lower leg, Left lower leg   Body parts bathed by helper: Buttocks, Right lower leg, Left lower leg     Bathing assist Assist Level: Contact Guard/Touching assist Assistive Device Comment: LH sponge   Upper Body Dressing/Undressing Upper body dressing   What is the patient wearing?: Pull over shirt    Upper body assist Assist Level: Set up assist    Lower Body Dressing/Undressing Lower body dressing      What is the patient wearing?: Pants, Incontinence brief     Lower body assist Assist for lower body dressing: Minimal Assistance - Patient > 75%     Toileting Toileting    Toileting assist Assist for toileting: Minimal Assistance - Patient > 75%     Transfers Chair/bed transfer  Transfers assist     Chair/bed transfer assist level: Contact Guard/Touching assist     Locomotion Ambulation   Ambulation assist   Ambulation activity did not occur: Safety/medical concerns  Assist level: Minimal Assistance - Patient > 75% Assistive device: Walker-rolling Max distance: 40'   Walk 10 feet activity   Assist  Walk 10 feet activity did not occur: Safety/medical concerns  Assist level: Minimal Assistance - Patient > 75% Assistive device: Walker-rolling   Walk 50 feet activity   Assist Walk 50 feet with 2 turns activity did not occur: Safety/medical concerns         Walk 150 feet activity   Assist Walk 150 feet activity did not occur: Safety/medical concerns         Walk 10 feet on uneven  surface  activity   Assist Walk 10 feet on uneven surfaces activity did not occur: Safety/medical concerns         Wheelchair     Assist Will patient use wheelchair at discharge?: Yes Type of Wheelchair: Manual    Wheelchair assist level: Total Assistance - Patient < 25% Max wheelchair distance: 88'    Wheelchair 50 feet with 2 turns activity    Assist    Wheelchair 50 feet with 2 turns activity did not occur: Safety/medical concerns       Wheelchair 150 feet activity     Assist   CBG (last 3)  Recent Labs    04/10/19 2117 04/11/19 0621 04/11/19 1238  GLUCAP 148* 101* 187*    Wheelchair 150 feet activity did not occur: Safety/medical concerns       Blood pressure (!) 126/98, pulse (!) 56, temperature 97.9 F (36.6 C), temperature source Oral, resp. rate 16, weight 80.5 kg, SpO2 100 %.  Medical Problem List and Plan: 1.  Functional deficits  secondary to Right thalamic stroke             -Continue CIR therapies including PT, OT, and SLP   -continued gains.  Baseline cognitive deficts   -ELOS 10/10 2.  Antithrombotics: -DVT/anticoagulation:  Mechanical:  Antiembolism stockings, knee (TED hose) Bilateral lower extremities             -antiplatelet therapy: Low dose ASA, will DC if platelets trend less than 50. (89k today 10/1) 3. Pain Management: Tylenol prn  4. Mood: LCSW to follow for evaluation and support.              -antipsychotic agents: N/a 5. Neuropsych: This patient is not fully capable of making decisions on her own behalf. 6. Skin/Wound Care: Routine pressure relief measures.  7. Fluids/Electrolytes/Nutrition:    .    -continue protein supp for low albumin   -bun/cr improved---recheck Monday 8. HTN: Monitor BP tid and avoid hypoperfusion. Continue to hold Cozaar and HCTZ.             - improved control 9. T2DM with hyperglycemia: Hgb A1C- 8.5. Blood sugars still labile---will monitor BS ac/hs. Was on Amaryl and metformin  PTA--  -metformin resumed and changed to qd per family request   -titrated amaryl to 6mg  6/27  -BGs improved 10/2    10. Myelodysplasia with acute on chronic thrombocytopenia: Platelets on downward trend 91-->71--> 67    -serial CBC's  -no clinical signs of bleeding  -hgb 9.3.-->.8.9  10/1  -recheck Monday 11. Acute on chronic renal failure: BUN on rise--encourage fluid intake. Monitor with serial checks.              probably near baseline---recheck thursday 12. Hypomagnesemia: 1.4 yesterday  -supplement  9/28- Mg up to 1.7- continue daily supplement 13. Dyslipidemia: On Crestor. 14. Constipation  -having daily BM's 15. E Coli UTI:  -sensitive to bactrim--continue for 7 days   LOS: 9 days A FACE TO FACE EVALUATION WAS PERFORMED  Jinny Blossom  Tonya Carlile 04/11/2019, 3:10 PM

## 2019-04-11 NOTE — Plan of Care (Signed)
  Problem: Consults Goal: RH STROKE PATIENT EDUCATION Description: See Patient Education module for education specifics  Outcome: Progressing Goal: Nutrition Consult-if indicated Outcome: Progressing Goal: Diabetes Guidelines if Diabetic/Glucose > 140 Description: If diabetic or lab glucose is > 140 mg/dl - Initiate Diabetes/Hyperglycemia Guidelines & Document Interventions  Outcome: Progressing   Problem: RH BOWEL ELIMINATION Goal: RH STG MANAGE BOWEL WITH ASSISTANCE Description: STG Manage Bowel with mod Assistance. Outcome: Progressing   Problem: RH BLADDER ELIMINATION Goal: RH STG MANAGE BLADDER WITH ASSISTANCE Description: STG Manage Bladder With mod Assistance Outcome: Progressing   Problem: RH SKIN INTEGRITY Goal: RH STG SKIN FREE OF INFECTION/BREAKDOWN Description: Min assist- free skin breakdown Outcome: Progressing Goal: RH STG ABLE TO PERFORM INCISION/WOUND CARE W/ASSISTANCE Description: STG Able To Perform Incision/Wound Care With min Assistance. Outcome: Progressing   Problem: RH SAFETY Goal: RH STG ADHERE TO SAFETY PRECAUTIONS W/ASSISTANCE/DEVICE Description: STG Adhere to Safety Precautions With mod Assistance/Device. Outcome: Progressing Goal: RH STG DECREASED RISK OF FALL WITH ASSISTANCE Description: STG Decreased Risk of Fall With mod Assistance. Outcome: Progressing   Problem: RH COGNITION-NURSING Goal: RH STG USES MEMORY AIDS/STRATEGIES W/ASSIST TO PROBLEM SOLVE Description: STG Uses Memory Aids/Strategies With min Assistance to Problem Solve. Outcome: Progressing Goal: RH STG ANTICIPATES NEEDS/CALLS FOR ASSIST W/ASSIST/CUES Description: STG Anticipates Needs/Calls for Assist With min Assistance/Cues. Outcome: Progressing   Problem: RH KNOWLEDGE DEFICIT Goal: RH STG INCREASE KNOWLEDGE OF DIABETES Outcome: Progressing Goal: RH STG INCREASE KNOWLEDGE OF HYPERTENSION Outcome: Progressing Goal: RH STG INCREASE KNOWLEDGE OF DYSPHAGIA/FLUID  INTAKE Outcome: Progressing Goal: RH STG INCREASE KNOWLEGDE OF HYPERLIPIDEMIA Outcome: Progressing Goal: RH STG INCREASE KNOWLEDGE OF STROKE PROPHYLAXIS Outcome: Progressing

## 2019-04-12 ENCOUNTER — Inpatient Hospital Stay (HOSPITAL_COMMUNITY): Payer: Medicare HMO | Admitting: Speech Pathology

## 2019-04-12 LAB — GLUCOSE, CAPILLARY
Glucose-Capillary: 97 mg/dL (ref 70–99)
Glucose-Capillary: 174 mg/dL — ABNORMAL HIGH (ref 70–99)
Glucose-Capillary: 202 mg/dL — ABNORMAL HIGH (ref 70–99)
Glucose-Capillary: 221 mg/dL — ABNORMAL HIGH (ref 70–99)

## 2019-04-12 NOTE — Progress Notes (Signed)
[place] PHYSICAL MEDICINE & REHABILITATION PROGRESS NOTE   Subjective/Complaints:   Pt reports doing pretty good- said her head felt heavy because she's "so tired".- said she slept "ok" overnight.    ROS: Patient denies fever, rash, sore throat, blurred vision, nausea, vomiting, diarrhea, cough, shortness of breath or chest pain, joint or back pain, headache, or mood change.   Objective:   No results found. No results for input(s): WBC, HGB, HCT, PLT in the last 72 hours. No results for input(s): NA, K, CL, CO2, GLUCOSE, BUN, CREATININE, CALCIUM in the last 72 hours.  Intake/Output Summary (Last 24 hours) at 04/12/2019 1455 Last data filed at 04/12/2019 1300 Gross per 24 hour  Intake 840 ml  Output -  Net 840 ml     Physical Exam: Vital Signs Blood pressure 125/81, pulse 70, temperature 98.4 F (36.9 C), temperature source Oral, resp. rate 19, weight 80.5 kg, SpO2 97 %.  Constitutional: No distress . Vital signs reviewed. Lying in bed; sleepy-trying to watch tv, but kept falling asleep, NAD HEENT: EOMI, oral membranes moist. No bumps, bruises, tenderness seen Neck: supple Cardiovascular: RRR without murmur. No JVD    Respiratory: CTA Bilaterally without wheezes or rales. Normal effort    GI: BS +, non-tender, non-distended   Musculoskeletal:     Comments: No edema or tenderness in extremities  Neurological:  Mild left central 7. More attentive, quicker processing  Motor: Right upper extremity: 5/5 proximal distal Left upper extremity: 2+ to 3-/5 proximal distal Right lower extremity: 4+/5 proximal distal Left lower extremity:3/5 proximal to distal--motor scores stable Dysarthria improving.  Initiating more with LUE and LLE. Ongoing LUE sensory loss Skin: Skin is warm and dry. She is not diaphoretic.  Psychiatric: pleasant, a little flat  Assessment/Plan: 1. Functional deficits secondary to right thalamic infarct which require 3+ hours per day of  interdisciplinary therapy in a comprehensive inpatient rehab setting.  Physiatrist is providing close team supervision and 24 hour management of active medical problems listed below.  Physiatrist and rehab team continue to assess barriers to discharge/monitor patient progress toward functional and medical goals  Care Tool:  Bathing    Body parts bathed by patient: Right arm, Left arm, Chest, Abdomen, Front perineal area, Right upper leg, Left upper leg, Face, Buttocks, Right lower leg, Left lower leg   Body parts bathed by helper: Buttocks, Right lower leg, Left lower leg     Bathing assist Assist Level: Contact Guard/Touching assist Assistive Device Comment: LH sponge   Upper Body Dressing/Undressing Upper body dressing   What is the patient wearing?: Pull over shirt    Upper body assist Assist Level: Set up assist    Lower Body Dressing/Undressing Lower body dressing      What is the patient wearing?: Pants, Incontinence brief     Lower body assist Assist for lower body dressing: Minimal Assistance - Patient > 75%     Toileting Toileting    Toileting assist Assist for toileting: Minimal Assistance - Patient > 75%     Transfers Chair/bed transfer  Transfers assist     Chair/bed transfer assist level: Contact Guard/Touching assist     Locomotion Ambulation   Ambulation assist   Ambulation activity did not occur: Safety/medical concerns  Assist level: Minimal Assistance - Patient > 75% Assistive device: Walker-rolling Max distance: 40'   Walk 10 feet activity   Assist  Walk 10 feet activity did not occur: Safety/medical concerns  Assist level: Minimal Assistance - Patient >  75% Assistive device: Walker-rolling   Walk 50 feet activity   Assist Walk 50 feet with 2 turns activity did not occur: Safety/medical concerns         Walk 150 feet activity   Assist Walk 150 feet activity did not occur: Safety/medical concerns         Walk 10  feet on uneven surface  activity   Assist Walk 10 feet on uneven surfaces activity did not occur: Safety/medical concerns         Wheelchair     Assist Will patient use wheelchair at discharge?: Yes Type of Wheelchair: Manual    Wheelchair assist level: Total Assistance - Patient < 25% Max wheelchair distance: 20'    Wheelchair 50 feet with 2 turns activity    Assist    Wheelchair 50 feet with 2 turns activity did not occur: Safety/medical concerns       Wheelchair 150 feet activity     Assist   CBG (last 3)  Recent Labs    04/11/19 2103 04/12/19 0640 04/12/19 1148  GLUCAP 205* 97 174*    Wheelchair 150 feet activity did not occur: Safety/medical concerns       Blood pressure 125/81, pulse 70, temperature 98.4 F (36.9 C), temperature source Oral, resp. rate 19, weight 80.5 kg, SpO2 97 %.  Medical Problem List and Plan: 1.  Functional deficits  secondary to Right thalamic stroke             -Continue CIR therapies including PT, OT, and SLP   -continued gains.  Baseline cognitive deficts   -ELOS 10/10 2.  Antithrombotics: -DVT/anticoagulation:  Mechanical:  Antiembolism stockings, knee (TED hose) Bilateral lower extremities             -antiplatelet therapy: Low dose ASA, will DC if platelets trend less than 50. (89k today 10/1) 3. Pain Management: Tylenol prn  4. Mood: LCSW to follow for evaluation and support.              -antipsychotic agents: N/a 5. Neuropsych: This patient is not fully capable of making decisions on her own behalf. 6. Skin/Wound Care: Routine pressure relief measures.  7. Fluids/Electrolytes/Nutrition:    .    -continue protein supp for low albumin   -bun/cr improved---recheck Monday 8. HTN: Monitor BP tid and avoid hypoperfusion. Continue to hold Cozaar and HCTZ.             - improved control 9. T2DM with hyperglycemia: Hgb A1C- 8.5. Blood sugars still labile---will monitor BS ac/hs. Was on Amaryl and metformin  PTA--  -metformin resumed and changed to qd per family request   -titrated amaryl to 6mg  6/27  -BGs improved 10/4    10. Myelodysplasia with acute on chronic thrombocytopenia: Platelets on downward trend 91-->71--> 67    -serial CBC's  -no clinical signs of bleeding  -hgb 9.3.-->.8.9  10/1  -recheck Monday 11. Acute on chronic renal failure: BUN on rise--encourage fluid intake. Monitor with serial checks.              probably near baseline---recheck thursday 12. Hypomagnesemia: 1.4 yesterday  -supplement  9/28- Mg up to 1.7- continue daily supplement 13. Dyslipidemia: On Crestor. 14. Constipation  -having daily BM's 15. E Coli UTI:  -sensitive to bactrim--continue for 7 days   LOS: 10 days A FACE TO FACE EVALUATION WAS PERFORMED  Stacey Harris 04/12/2019, 2:55 PM

## 2019-04-12 NOTE — Progress Notes (Signed)
Speech Language Pathology Daily Session Note  Patient Details  Name: Stacey Harris MRN: [id number] Date of Birth: 09-Aug-1928  Today's Date: 04/12/2019 SLP Individual Time: [id number] SLP Individual Time Calculation (min): 40 min  Short Term Goals: Week 2: SLP Short Term Goal 1 (Week 2): Patient will consume current diet with minimal overt s/s of aspiration with supervision level verbal cues for use of swallowing compensatory strategies. SLP Short Term Goal 2 (Week 2): Patient will utilize external aids for recall of functional information with Min A verbal and visual cues. SLP Short Term Goal 3 (Week 2): Patient will demonstrate basic problem solving for functional and familair tasks with Min A verbal cues.  Skilled Therapeutic Interventions:  Pt was seen for skilled ST targeting cognitive goals.  Pt resting in bed upon arrival, stating that she was tired; however, she was agreeable to getting out of bed for therapy session.  Once transferred to wheelchair, pt appeared brighter and her mentation clearer in comparison to previous therapy sessions.  SLP facilitated the session with a basic grocery shopping task to address problem solving goals.  Pt located targeted items from a grocery store flyer with overall min assist verbal cues for task organization.  She then completed basic mental math calculations related to her grocery list with min verbal cues to recognize and correct errors.  Pt requested to return to bed at the end of the session.  Pt was left in bed with bed alarm set and call bell within reach.  Continue per current plan of care.     Pain Pain Assessment Pain Scale: 0-10 Pain Score: 0-No pain  Therapy/Group: Individual Therapy  Casy Tavano, Selinda Orion 04/12/2019, 4:24 PM

## 2019-04-12 NOTE — Plan of Care (Signed)
  Problem: Consults Goal: RH STROKE PATIENT EDUCATION Description: See Patient Education module for education specifics  Outcome: Progressing Goal: Nutrition Consult-if indicated Outcome: Progressing Goal: Diabetes Guidelines if Diabetic/Glucose > 140 Description: If diabetic or lab glucose is > 140 mg/dl - Initiate Diabetes/Hyperglycemia Guidelines & Document Interventions  Outcome: Progressing   Problem: RH BOWEL ELIMINATION Goal: RH STG MANAGE BOWEL WITH ASSISTANCE Description: STG Manage Bowel with min Assistance. Outcome: Progressing   Problem: RH BLADDER ELIMINATION Goal: RH STG MANAGE BLADDER WITH ASSISTANCE Description: STG Manage Bladder With min Assistance Outcome: Progressing   Problem: RH SKIN INTEGRITY Goal: RH STG SKIN FREE OF INFECTION/BREAKDOWN Description: Min assist- free skin breakdown Outcome: Progressing Goal: RH STG ABLE TO PERFORM INCISION/WOUND CARE W/ASSISTANCE Description: STG Able To Perform Incision/Wound Care With min Assistance. Outcome: Progressing   Problem: RH SAFETY Goal: RH STG ADHERE TO SAFETY PRECAUTIONS W/ASSISTANCE/DEVICE Description: STG Adhere to Safety Precautions With min Assistance/Device. Outcome: Progressing Goal: RH STG DECREASED RISK OF FALL WITH ASSISTANCE Description: STG Decreased Risk of Fall With min Assistance. Outcome: Progressing   Problem: RH COGNITION-NURSING Goal: RH STG USES MEMORY AIDS/STRATEGIES W/ASSIST TO PROBLEM SOLVE Description: STG Uses Memory Aids/Strategies With min Assistance to Problem Solve. Outcome: Progressing Goal: RH STG ANTICIPATES NEEDS/CALLS FOR ASSIST W/ASSIST/CUES Description: STG Anticipates Needs/Calls for Assist With min Assistance/Cues. Outcome: Progressing

## 2019-04-13 ENCOUNTER — Inpatient Hospital Stay (HOSPITAL_COMMUNITY): Payer: Medicare HMO | Admitting: Physical Therapy

## 2019-04-13 ENCOUNTER — Inpatient Hospital Stay (HOSPITAL_COMMUNITY): Payer: Medicare HMO | Admitting: Speech Pathology

## 2019-04-13 ENCOUNTER — Inpatient Hospital Stay (HOSPITAL_COMMUNITY): Payer: Medicare HMO | Admitting: Occupational Therapy

## 2019-04-13 DIAGNOSIS — B9620 Unspecified Escherichia coli [E. coli] as the cause of diseases classified elsewhere: Secondary | ICD-10-CM

## 2019-04-13 DIAGNOSIS — N179 Acute kidney failure, unspecified: Secondary | ICD-10-CM

## 2019-04-13 DIAGNOSIS — R7309 Other abnormal glucose: Secondary | ICD-10-CM

## 2019-04-13 DIAGNOSIS — N183 Chronic kidney disease, stage 3 unspecified: Secondary | ICD-10-CM

## 2019-04-13 DIAGNOSIS — E1165 Type 2 diabetes mellitus with hyperglycemia: Secondary | ICD-10-CM

## 2019-04-13 DIAGNOSIS — N390 Urinary tract infection, site not specified: Secondary | ICD-10-CM

## 2019-04-13 DIAGNOSIS — I10 Essential (primary) hypertension: Secondary | ICD-10-CM

## 2019-04-13 DIAGNOSIS — D469 Myelodysplastic syndrome, unspecified: Secondary | ICD-10-CM

## 2019-04-13 LAB — FERRITIN: Ferritin: 124 ng/mL (ref 11–307)

## 2019-04-13 LAB — BASIC METABOLIC PANEL
Anion gap: 6 (ref 5–15)
BUN: 20 mg/dL (ref 8–23)
CO2: 21 mmol/L — ABNORMAL LOW (ref 22–32)
Calcium: 8.9 mg/dL (ref 8.9–10.3)
Chloride: 114 mmol/L — ABNORMAL HIGH (ref 98–111)
Creatinine, Ser: 1.43 mg/dL — ABNORMAL HIGH (ref 0.44–1.00)
GFR calc Af Amer: 38 mL/min — ABNORMAL LOW (ref >60)
GFR calc non Af Amer: 33 mL/min — ABNORMAL LOW (ref >60)
Glucose, Bld: 108 mg/dL — ABNORMAL HIGH (ref 70–99)
Potassium: 4.5 mmol/L (ref 3.5–5.1)
Sodium: 141 mmol/L (ref 135–145)

## 2019-04-13 LAB — FOLATE: Folate: 22.5 ng/mL (ref >5.9)

## 2019-04-13 LAB — CBC
HCT: 26.6 % — ABNORMAL LOW (ref 36.0–46.0)
Hemoglobin: 8.7 g/dL — ABNORMAL LOW (ref 12.0–15.0)
MCH: 33.6 pg (ref 26.0–34.0)
MCHC: 32.7 g/dL (ref 30.0–36.0)
MCV: 102.7 fL — ABNORMAL HIGH (ref 80.0–100.0)
Platelets: 107 10*3/uL — ABNORMAL LOW (ref 150–400)
RBC: 2.59 MIL/uL — ABNORMAL LOW (ref 3.87–5.11)
RDW: 14.8 % (ref 11.5–15.5)
WBC: 4.3 10*3/uL (ref 4.0–10.5)
nRBC: 0 % (ref 0.0–0.2)

## 2019-04-13 LAB — GLUCOSE, CAPILLARY
Glucose-Capillary: 101 mg/dL — ABNORMAL HIGH (ref 70–99)
Glucose-Capillary: 221 mg/dL — ABNORMAL HIGH (ref 70–99)
Glucose-Capillary: 165 mg/dL — ABNORMAL HIGH (ref 70–99)
Glucose-Capillary: 224 mg/dL — ABNORMAL HIGH (ref 70–99)

## 2019-04-13 LAB — IRON AND TIBC
Iron: 42 ug/dL (ref 28–170)
Saturation Ratios: 18 % (ref 10.4–31.8)
TIBC: 230 ug/dL — ABNORMAL LOW (ref 250–450)
UIBC: 188 ug/dL

## 2019-04-13 LAB — RETICULOCYTES
Immature Retic Fract: 22.5 % — ABNORMAL HIGH (ref 2.3–15.9)
RBC.: 2.73 MIL/uL — ABNORMAL LOW (ref 3.87–5.11)
Retic Count, Absolute: 65 10*3/uL (ref 19.0–186.0)
Retic Ct Pct: 2.4 % (ref 0.4–3.1)

## 2019-04-13 LAB — VITAMIN B12: Vitamin B-12: 689 pg/mL (ref 180–914)

## 2019-04-13 MED ORDER — SODIUM CHLORIDE 0.9 % IV SOLN
INTRAVENOUS | Status: DC
  Administered 2019-04-13: 16:00:00 via INTRAVENOUS
  Administered 2019-04-15: 100 mL/h via INTRAVENOUS

## 2019-04-13 NOTE — Progress Notes (Signed)
Speech Language Pathology Daily Session Note  Patient Details  Name: Stacey Harris MRN: [id number] Date of Birth: 1929/06/26  Today's Date: 04/13/2019 SLP Individual Time: [id number] SLP Individual Time Calculation (min): 55 min  Short Term Goals: Week 2: SLP Short Term Goal 1 (Week 2): Patient will consume current diet with minimal overt s/s of aspiration with supervision level verbal cues for use of swallowing compensatory strategies. SLP Short Term Goal 2 (Week 2): Patient will utilize external aids for recall of functional information with Min A verbal and visual cues. SLP Short Term Goal 3 (Week 2): Patient will demonstrate basic problem solving for functional and familair tasks with Min A verbal cues.  Skilled Therapeutic Interventions: Skilled treatment session focused on cognitive goals. SLP facilitated session by providing extra time and supervision level verbal cues for problem solving, sequencing and scanning with 4 step picture cards and Min-Mod A verbal cues with 6 step picture sequencing cards. Patient also made a monthly calendar with extra time and supervision verbal cues for attention to left environment/LUE during task. Patient left upright in recliner with alarm on and all needs within reach. Continue with current plan of care.      Pain    Therapy/Group: Individual Therapy  Tya Haughey 04/13/2019, 12:33 PM

## 2019-04-13 NOTE — Progress Notes (Signed)
PHYSICAL MEDICINE & REHABILITATION PROGRESS NOTE   Subjective/Complaints: Patient seen siting up in bed this AM.  She states she slept well overnight.   ROS: Denies CP, SOB, N/V/D  Objective:   No results found. Recent Labs    04/13/19 0730  WBC 4.3  HGB 8.7*  HCT 26.6*  PLT 107*   Recent Labs    04/13/19 0730  NA 141  K 4.5  CL 114*  CO2 21*  GLUCOSE 108*  BUN 20  CREATININE 1.43*  CALCIUM 8.9    Intake/Output Summary (Last 24 hours) at 04/13/2019 1444 Last data filed at 04/13/2019 1300 Gross per 24 hour  Intake 840 ml  Output -  Net 840 ml     Physical Exam: Vital Signs Blood pressure 125/70, pulse 78, temperature 98 F (36.7 C), temperature source Oral, resp. rate 16, weight 80.5 kg, SpO2 98 %. Constitutional: No distress . Vital signs reviewed. HENT: Normocephalic.  Atraumatic. Eyes: EOMI. No discharge. Cardiovascular: No JVD. Respiratory: Normal effort.  No stridor. GI: Non-distended. Skin: Warm and dry.  Intact. Psych: Normal mood.  Normal behavior. Musc: No edema in extremities.  No tenderness in extremities. Neurological: Alert and oriented x3. HOH Mild left central 7. Motor: Right upper extremity: 5/5 proximal distal Left upper extremity: 4-/5 proximal distal Right lower extremity: 4+/5 proximal distal Left lower extremity: 4+/5 proximal to distal Dysarthria improving.   Assessment/Plan: 1. Functional deficits secondary to right thalamic infarct which require 3+ hours per day of interdisciplinary therapy in a comprehensive inpatient rehab setting.  Physiatrist is providing close team supervision and 24 hour management of active medical problems listed below.  Physiatrist and rehab team continue to assess barriers to discharge/monitor patient progress toward functional and medical goals  Care Tool:  Bathing    Body parts bathed by patient: Right arm, Left arm, Chest, Abdomen, Front perineal area, Right upper leg, Left upper  leg, Face, Buttocks, Right lower leg, Left lower leg   Body parts bathed by helper: Buttocks, Right lower leg, Left lower leg     Bathing assist Assist Level: Contact Guard/Touching assist Assistive Device Comment: LH sponge   Upper Body Dressing/Undressing Upper body dressing   What is the patient wearing?: Pull over shirt    Upper body assist Assist Level: Set up assist    Lower Body Dressing/Undressing Lower body dressing      What is the patient wearing?: Pants, Incontinence brief     Lower body assist Assist for lower body dressing: Minimal Assistance - Patient > 75%     Toileting Toileting    Toileting assist Assist for toileting: Minimal Assistance - Patient > 75%     Transfers Chair/bed transfer  Transfers assist     Chair/bed transfer assist level: Contact Guard/Touching assist     Locomotion Ambulation   Ambulation assist   Ambulation activity did not occur: Safety/medical concerns  Assist level: Minimal Assistance - Patient > 75% Assistive device: Walker-rolling Max distance: 40'   Walk 10 feet activity   Assist  Walk 10 feet activity did not occur: Safety/medical concerns  Assist level: Minimal Assistance - Patient > 75% Assistive device: Walker-rolling   Walk 50 feet activity   Assist Walk 50 feet with 2 turns activity did not occur: Safety/medical concerns         Walk 150 feet activity   Assist Walk 150 feet activity did not occur: Safety/medical concerns         Walk 10 feet on  uneven surface  activity   Assist Walk 10 feet on uneven surfaces activity did not occur: Safety/medical concerns         Wheelchair     Assist Will patient use wheelchair at discharge?: Yes Type of Wheelchair: Manual    Wheelchair assist level: Total Assistance - Patient < 25% Max wheelchair distance: 79'    Wheelchair 50 feet with 2 turns activity    Assist    Wheelchair 50 feet with 2 turns activity did not occur:  Safety/medical concerns       Wheelchair 150 feet activity     Assist    Wheelchair 150 feet activity did not occur: Safety/medical concerns       Medical Problem List and Plan: 1.  Functional deficits  secondary to Right thalamic stroke             Cont CIR  -continued gains.  Baseline cognitive deficts  2.  Antithrombotics: -DVT/anticoagulation:  Mechanical:  Antiembolism stockings, knee (TED hose) Bilateral lower extremities             -antiplatelet therapy: Low dose ASA, will DC if platelets trend less than 50.  3. Pain Management: Tylenol prn  4. Mood: LCSW to follow for evaluation and support.              -antipsychotic agents: N/a 5. Neuropsych: This patient is not fully capable of making decisions on her own behalf. 6. Skin/Wound Care: Routine pressure relief measures.  7. Fluids/Electrolytes/Nutrition:      -continue protein supp for low albumin 8. HTN: Monitor BP tid and avoid hypoperfusion. Continue to hold Cozaar and HCTZ.   Controlled on 10/5 9. T2DM with hyperglycemia: Hgb A1C- 8.5. Blood sugars still labile---will monitor BS ac/hs. Was on Amaryl and metformin PTA--  -metformin resumed and changed to qd per family request, put on hold on 10/6 due to Cr   -titrated amaryl to 6mg  6/27  Labile on 10/5   10. Myelodysplasis:   -serial CBC's  -no clinical signs of bleeding  -hgb 8.7 on 10/5  Hemocult ordered 11. Acute on chronic renal failure:              Cr. 1.43 on 10/5  Encourage fluids  IVF started on 10/5  Cont to monitor 12. Hypomagnesemia:   -supplement  Mg up to 1.7 on 9/28 13. Dyslipidemia: On Crestor. 14. Constipation  -having daily BM's 15. E Coli UTI:  -sensitive to bactrim--continue for 7 days through 10/7 16. Thrombocytopenia  Plts 107 on 10/5  Cont to monitor  LOS: 11 days A FACE TO FACE EVALUATION WAS PERFORMED  Ankit [name] 04/13/2019, 2:44 PM

## 2019-04-13 NOTE — Progress Notes (Signed)
Physical Therapy Session Note  Patient Details  Name: ENSLEIGH CAPILLA MRN: [id number] Date of Birth: [birth year]  Today's Date: 04/13/2019 PT Individual Time: 1230-1255 AND 1540-1605 PT Individual Time Calculation (min): 25 min AND 25 min  Short Term Goals: Week 2:  PT Short Term Goal 1 (Week 2): =LTGs due to ELOS  Skilled Therapeutic Interventions/Progress Updates:   Session 1:  Pt received in recliner, eating lunch and agreeable to therapy, no c/o pain. Supervision while pt finished eating a few bites of lunch. Verbal cues to not try to speak while food was in her mouth for safety w/ swallowing. Ambulated from recliner to/from toilet in bathroom CGA using RW, verbal and tactile cues for RW management, upright posture, and for occasional L foot placement. Pt w/ shoes on today and did well w/ BLE foot clearance. Pt also stood at sink w/ CGA to wash hands. Moderate increase in work of breathing after ambulating to/from toilet AND standing to wash hands. Verbal cues for breathing strategies and discussed importance of rest breaks at home while doing this. Ended session in recliner all needs in reach.  Session 2: Pt in recliner and agreeable to therapy session, no c/o pain. Daughter present. Discussed discharge planning w/ daughter including AD use (daughter confirmed pt has RW at home), recommendation for 24/7 supervision, importance of wearing shoes, and energy conservation techniques. Ambulated 71' x2 w/ CGA using RW and educated daughter on pt's CLOF and need for some verbal/tactile cues with turns. Pt needing to toilet, ambulated to/from toilet w/ CGA and min assist toilet transfer for time management 2/2 urgency. Also discussed car transfer and daughter states she has a tall car w/ a foot plate. Will plan to practice real car transfer this Wednesday prior ot d/c on Friday. Ended session in recliner, all needs in reach.    Therapy Documentation Precautions:  Precautions Precautions:  Fall Restrictions Weight Bearing Restrictions: No  Therapy/Group: Individual Therapy  Alahia Whicker Clent Demark 04/13/2019, 12:56 PM

## 2019-04-13 NOTE — Progress Notes (Signed)
Occupational Therapy Session Note  Patient Details  Name: Stacey Harris MRN: [id number] Date of Birth: 05/15/1929  Today's Date: 04/13/2019 OT Individual Time: 1302-1415 OT Individual Time Calculation (min): 73 min    Short Term Goals: Week 2:  OT Short Term Goal 1 (Week 2): LTG=STG 2/2 ELOS  Skilled Therapeutic Interventions/Progress Updates:    Pt greeted sitting in recliner with nursing administering insulin. Pt agreeable to OT treatment session. Pt declined bathing/dressing today, as she already had on pants and a scrub top. Pt wanted to save new pajama outfit for tomorrow. Pt declined to go to the bathroom stating she just went. OT noted wc breaks would not lock, so OT exchanged wc out for new one with properly functioning breaks. Pt ambulated with RW 15 feet in room with CGA and increased cues for RW positioning when turning to sit in wc. Pt brought to therapy gym and was issued medium soft red theraputty. Worked on functional use of L hand grip/pinch, and coordination with thera-putty activities. Continued working on L fine motor coordination with focus on in-hand manipulation, translation, and rotation, of small peg board task. OT issued RW bag and placed on RW. Practiced ambulating in the kitchen, accessing drawers, cabinets, refrigerator, and freezer to locate fruits and place in RW bag. OT provided education on RW positioning within functional tasks and CGA for balance. Max verbal cues to recall education for RW position when accessing fridge and freezer. Pt returned to room at end of session and completed stand-pivot back to recliner with CGA. PT left with LEs elevated, alarm belt on, call bell in reach, and needs met.   Therapy Documentation Precautions:  Precautions Precautions: Fall Restrictions Weight Bearing Restrictions: No Pain:   denies pain  Therapy/Group: Individual Therapy  Elisabeth S Doe 04/13/2019, 2:11 PM 

## 2019-04-14 ENCOUNTER — Inpatient Hospital Stay (HOSPITAL_COMMUNITY): Payer: Medicare HMO | Admitting: Occupational Therapy

## 2019-04-14 ENCOUNTER — Inpatient Hospital Stay (HOSPITAL_COMMUNITY): Payer: Medicare HMO | Admitting: Physical Therapy

## 2019-04-14 ENCOUNTER — Inpatient Hospital Stay (HOSPITAL_COMMUNITY): Payer: Medicare HMO | Admitting: Speech Pathology

## 2019-04-14 ENCOUNTER — Ambulatory Visit: Payer: Medicare HMO

## 2019-04-14 DIAGNOSIS — G5602 Carpal tunnel syndrome, left upper limb: Secondary | ICD-10-CM

## 2019-04-14 DIAGNOSIS — R0989 Other specified symptoms and signs involving the circulatory and respiratory systems: Secondary | ICD-10-CM

## 2019-04-14 LAB — GLUCOSE, CAPILLARY
Glucose-Capillary: 112 mg/dL — ABNORMAL HIGH (ref 70–99)
Glucose-Capillary: 196 mg/dL — ABNORMAL HIGH (ref 70–99)
Glucose-Capillary: 163 mg/dL — ABNORMAL HIGH (ref 70–99)

## 2019-04-14 NOTE — Plan of Care (Signed)
  Problem: Consults Goal: RH STROKE PATIENT EDUCATION Description: See Patient Education module for education specifics  Outcome: Progressing Goal: Nutrition Consult-if indicated Outcome: Progressing Goal: Diabetes Guidelines if Diabetic/Glucose > 140 Description: If diabetic or lab glucose is > 140 mg/dl - Initiate Diabetes/Hyperglycemia Guidelines & Document Interventions  Outcome: Progressing   Problem: RH BOWEL ELIMINATION Goal: RH STG MANAGE BOWEL WITH ASSISTANCE Description: STG Manage Bowel with min Assistance. Outcome: Progressing   Problem: RH BLADDER ELIMINATION Goal: RH STG MANAGE BLADDER WITH ASSISTANCE Description: STG Manage Bladder With min Assistance Outcome: Progressing   Problem: RH SKIN INTEGRITY Goal: RH STG SKIN FREE OF INFECTION/BREAKDOWN Description: Min assist- free skin breakdown Outcome: Progressing Goal: RH STG ABLE TO PERFORM INCISION/WOUND CARE W/ASSISTANCE Description: STG Able To Perform Incision/Wound Care With min Assistance. Outcome: Progressing   Problem: RH SAFETY Goal: RH STG ADHERE TO SAFETY PRECAUTIONS W/ASSISTANCE/DEVICE Description: STG Adhere to Safety Precautions With min Assistance/Device. Outcome: Progressing Goal: RH STG DECREASED RISK OF FALL WITH ASSISTANCE Description: STG Decreased Risk of Fall With min Assistance. Outcome: Progressing   Problem: RH COGNITION-NURSING Goal: RH STG USES MEMORY AIDS/STRATEGIES W/ASSIST TO PROBLEM SOLVE Description: STG Uses Memory Aids/Strategies With min Assistance to Problem Solve. Outcome: Progressing Goal: RH STG ANTICIPATES NEEDS/CALLS FOR ASSIST W/ASSIST/CUES Description: STG Anticipates Needs/Calls for Assist With min Assistance/Cues. Outcome: Progressing   Problem: RH KNOWLEDGE DEFICIT Goal: RH STG INCREASE KNOWLEDGE OF DIABETES Outcome: Progressing Goal: RH STG INCREASE KNOWLEDGE OF HYPERTENSION Outcome: Progressing Goal: RH STG INCREASE KNOWLEDGE OF DYSPHAGIA/FLUID  INTAKE Outcome: Progressing Goal: RH STG INCREASE KNOWLEGDE OF HYPERLIPIDEMIA Outcome: Progressing Goal: RH STG INCREASE KNOWLEDGE OF STROKE PROPHYLAXIS Outcome: Progressing

## 2019-04-14 NOTE — Progress Notes (Signed)
Speech Language Pathology Daily Session Note  Patient Details  Name: MAGALY MCCLURE MRN: [id number] Date of Birth: [birth year]  Today's Date: 04/14/2019 SLP Individual Time: 0725-0820 SLP Individual Time Calculation (min): 55 min  Short Term Goals: Week 2: SLP Short Term Goal 1 (Week 2): Patient will consume current diet with minimal overt s/s of aspiration with supervision level verbal cues for use of swallowing compensatory strategies. SLP Short Term Goal 2 (Week 2): Patient will utilize external aids for recall of functional information with Min A verbal and visual cues. SLP Short Term Goal 3 (Week 2): Patient will demonstrate basic problem solving for functional and familair tasks with Min A verbal cues.  Skilled Therapeutic Interventions: Skilled treatment session focused on cognitive goals. SLP facilitated session by providing Min A verbal cues for functional problem solving and safety with transfer to the commode X 2 throughout session with patient able to successfully void X 2. Patient consumed current diet without overt s/s of aspiration with supervision level verbal cues for use of swallowing compensatory strategies. SLP also facilitated session by providing a simplified schedule for patient to anticipate upcoming therapy sessions with overall Min A verbal cues as well was extra time and Mod A verbal cues for use of a calendar to recall orientation to today's date. Patient left upright in recliner with alarm on and all needs within reach. Continue with current plan of care.      Pain Pain Assessment Pain Scale: 0-10 Pain Score: 0-No pain Faces Pain Scale: No hurt  Therapy/Group: Individual Therapy  Gyselle Matthew 04/14/2019, 10:13 AM

## 2019-04-14 NOTE — Progress Notes (Signed)
Physical Therapy Session Note  Patient Details  Name: XEE GOARD MRN: [id number] Date of Birth: August 21, 1928  Today's Date: 04/14/2019 PT Individual Time: 1100-1155 PT Individual Time Calculation (min): 55 min   Short Term Goals: Week 2:  PT Short Term Goal 1 (Week 2): =LTGs due to ELOS  Skilled Therapeutic Interventions/Progress Updates:   Pt in recliner and agreeable to therapy, no c/o pain. Ambulated 15' w/ CGA to w/c. Donned knee-high TEDs for BLE swelling. Total assist w/c transport to/from therapy gym. Worked on problem solving car transfer w/ daughter's high SUV w/ foot board. Simulated transfer w/ 8" step mimicking foot board, backwards, and then sitting on car seat. Performed x2 reps w/ min assist-CGA and verbal/tactile cues for technique. Pt reported she needed to urgently void. Transported to bathroom and performed toilet transfer w/ CGA-min assist. Worked on gait training in household environment, requiring pt to negotiate obstacles, ambulated 40' x2 reps. Ambulated back to room, 100' in 2 reps w/ CGA-min assist. Min assist more so needed w/ fatigue towards end of gait bout. Encourage pt to stop prior to feeling onset of fatigue to train energy conservation and pacing. Pt w/ moderate increase in work of breathing after first gait bout and verbalized understanding of importance of pacing and stopping prior to fatigue as risk of falling increases w/ fatigue. Returned to room and ended session in recliner, all needs in reach.   Therapy Documentation Precautions:  Precautions Precautions: Fall Restrictions Weight Bearing Restrictions: No Vital Signs:   Therapy/Group: Individual Therapy  Deannie Resetar K Reinette Cuneo 04/14/2019, 11:55 AM

## 2019-04-14 NOTE — Progress Notes (Signed)
[facility] [place] PHYSICAL MEDICINE & REHABILITATION PROGRESS NOTE   Subjective/Complaints: Patient seen sitting up in her chair this morning.  She states she did not sleep well overnight due to left hand pain.  She notes this problem prior to admission, but never sought medical attention.  ROS: Denies CP, SOB, N/V/D  Objective:   No results found. Recent Labs    04/13/19 0730  WBC 4.3  HGB 8.7*  HCT 26.6*  PLT 107*   Recent Labs    04/13/19 0730  NA 141  K 4.5  CL 114*  CO2 21*  GLUCOSE 108*  BUN 20  CREATININE 1.43*  CALCIUM 8.9    Intake/Output Summary (Last 24 hours) at 04/14/2019 1254 Last data filed at 04/14/2019 0810 Gross per 24 hour  Intake 1600 ml  Output -  Net 1600 ml     Physical Exam: Vital Signs Blood pressure (!) 141/95, pulse (!) 58, temperature 98.6 F (37 C), resp. rate 18, weight 80.5 kg, SpO2 98 %. Constitutional: No distress . Vital signs reviewed. HENT: Normocephalic.  Atraumatic. Eyes: EOMI. No discharge. Cardiovascular: No JVD. Respiratory: Normal effort.  No stridor. GI: Non-distended. Skin: Warm and dry.  Intact. Psych: + Flat. Musc: No edema in extremities.  No tenderness in extremities, including left wrist. + Durkin's on left Neurological: Alert and oriented HOH Motor: Left upper extremity: 4/5 proximal distal Left lower extremity: 4+/5 proximal to distal Dysarthria improving  Assessment/Plan: 1. Functional deficits secondary to right thalamic infarct which require 3+ hours per day of interdisciplinary therapy in a comprehensive inpatient rehab setting.  Physiatrist is providing close team supervision and 24 hour management of active medical problems listed below.  Physiatrist and rehab team continue to assess barriers to discharge/monitor patient progress toward functional and medical goals  Care Tool:  Bathing    Body parts bathed by patient: Right arm, Left arm, Chest, Abdomen, Front perineal area, Right upper leg, Left  upper leg, Face, Buttocks, Right lower leg, Left lower leg   Body parts bathed by helper: Buttocks, Right lower leg, Left lower leg     Bathing assist Assist Level: Contact Guard/Touching assist Assistive Device Comment: LH sponge   Upper Body Dressing/Undressing Upper body dressing   What is the patient wearing?: Pull over shirt    Upper body assist Assist Level: Set up assist    Lower Body Dressing/Undressing Lower body dressing      What is the patient wearing?: Pants     Lower body assist Assist for lower body dressing: Contact Guard/Touching assist     Toileting Toileting    Toileting assist Assist for toileting: Contact Guard/Touching assist     Transfers Chair/bed transfer  Transfers assist     Chair/bed transfer assist level: Contact Guard/Touching assist     Locomotion Ambulation   Ambulation assist   Ambulation activity did not occur: Safety/medical concerns  Assist level: Contact Guard/Touching assist Assistive device: Walker-rolling Max distance: 50'   Walk 10 feet activity   Assist  Walk 10 feet activity did not occur: Safety/medical concerns  Assist level: Contact Guard/Touching assist Assistive device: Walker-rolling   Walk 50 feet activity   Assist Walk 50 feet with 2 turns activity did not occur: Safety/medical concerns  Assist level: Contact Guard/Touching assist Assistive device: Walker-rolling    Walk 150 feet activity   Assist Walk 150 feet activity did not occur: Safety/medical concerns         Walk 10 feet on uneven surface  activity  Assist Walk 10 feet on uneven surfaces activity did not occur: Safety/medical concerns         Wheelchair     Assist Will patient use wheelchair at discharge?: Yes Type of Wheelchair: Manual    Wheelchair assist level: Total Assistance - Patient < 25% Max wheelchair distance: 46'    Wheelchair 50 feet with 2 turns activity    Assist    Wheelchair 50 feet  with 2 turns activity did not occur: Safety/medical concerns       Wheelchair 150 feet activity     Assist    Wheelchair 150 feet activity did not occur: Safety/medical concerns       Medical Problem List and Plan: 1.  Functional deficits  secondary to Right thalamic stroke             Continue CIR  -continued gains.  Baseline cognitive deficts   Team conference today to discuss current and goals and coordination of care, home and environmental barriers, and discharge planning with nursing, case manager, and therapies.  2.  Antithrombotics: -DVT/anticoagulation:  Mechanical:  Antiembolism stockings, knee (TED hose) Bilateral lower extremities             -antiplatelet therapy: Low dose ASA, will DC if platelets trend less than 50.  3. Pain Management: Tylenol prn  4. Mood: LCSW to follow for evaluation and support.              -antipsychotic agents: N/a 5. Neuropsych: This patient is not fully capable of making decisions on her own behalf. 6. Skin/Wound Care: Routine pressure relief measures.  7. Fluids/Electrolytes/Nutrition:      -continue protein supp for low albumin 8. HTN: Monitor BP tid and avoid hypoperfusion. Continue to hold Cozaar and HCTZ.   Slightly labile on 10/6 9. T2DM with hyperglycemia: Hgb A1C- 8.5. Blood sugars still labile---will monitor BS ac/hs. Was on Amaryl and metformin PTA--  -metformin resumed and changed to qd per family request, put on hold on 10/6 due to Cr   -titrated amaryl to 6mg  6/27  Labile on 10/6, monitor for trend   10. Myelodysplasis:   -serial CBC's  -no clinical signs of bleeding  -hgb 8.7 on 10/5  Hemocult pending 11. Acute on chronic renal failure:              Cr. 1.43 on 10/5, labs ordered for tomorrow  Encourage fluids  IVF started on 10/5  Cont to monitor 12. Hypomagnesemia:   -supplement  Mg up to 1.7 on 9/28 13. Dyslipidemia: On Crestor. 14. Constipation  -having daily BM's 15. E Coli UTI:  -sensitive to  bactrim--continue for 7 days through 10/7 16. Thrombocytopenia  Plts 107 on 10/5  Cont to monitor 17.  Left CTS  Resting WHO ordered qhs  LOS: 12 days A FACE TO FACE EVALUATION WAS PERFORMED  Ankit [name] 04/14/2019, 12:54 PM

## 2019-04-14 NOTE — Progress Notes (Signed)
Occupational Therapy Session Note  Patient Details  Name: Stacey Harris MRN: [id number] Date of Birth: 09/04/1928  Today's Date: 04/14/2019  Session 1 OT Individual Time: 2707-8675 OT Individual Time Calculation (min): 27 min   Session 2 OT Individual Time: 1400-1500 OT Individual Time Calculation (min): 60 min    Short Term Goals: Week 2:  OT Short Term Goal 1 (Week 2): LTG=STG 2/2 ELOS  Skilled Therapeutic Interventions/Progress Updates:  Session 1   Pt greeted sitting in recliner with nursing administering medications. Pt declined bathing this morning but wanted to change clothes. Pt donned pants with with verbal cues to thread LLE first. Sit<>stand with CGA and CGA for balance when pulling pants up over hips. Pt donned shirt with set-up A. Pt ambulated to the sink with RW and CGA. Education provided for RW positioning at the sink. Worked on standing balance/endurance and L fine motor coordination within toothbrushing task and opening screw top containers. Pt returned to recliner at end of session and left reclined with call bell in reach, chair alarm on, and needs met.   Session 2 Pt greeted seated in recliner and agreeable to OT treatment session. Pt ambulated in room with RW to her wc with CGA and verbal cues for RW management when turning to sit. Worked on balance strategies and LB there-ex standing at the parallel bars facing the mirror. Facilitation to maintain hip/trunk extension when fatigued. 3 sets of 10 standing hip abduction, toe taps, and marching in place. Pt completed obstacle course with focus on RW positioning when turning to the L around cones. Incorporated visual scanning to locate numbers on the wall and place in RW bag. CGA for ambulation with RW and verbal cues for body positioning in RW when turning around cones. Pt completed obstacle course 3x with extended rest breaks in between sets. Pt returned to room in wc and ambulated into bathroom w/ RW, CGA, and continued  verbal cues for RW management. Pt voided bladder and completed 3/3 toileting steps with supervision. Pt ambulated back to recliner in similar fashion. Daughter in the room upon return from the gym and OT discussed plan of care, DME needs, and plan for her to come in this week for hands on training. Pt left seated in recliner with alarm belt on, call bell in reach, and daughter present.   Therapy Documentation Precautions:  Precautions Precautions: Fall Restrictions Weight Bearing Restrictions: No Pain:  denies pain  Therapy/Group: Individual Therapy  Valma Cava 04/14/2019, 3:07 PM

## 2019-04-14 NOTE — Progress Notes (Signed)
Orthopedic Tech Progress Note Patient Details:  Stacey Harris Apr 18, 1929 [id number] Called in order to [employer] for a RESTING HAND SPLINT Patient ID: Stacey Harris, female   DOB: [birth year], 85 y.o.   MRN: [id number]   Janit Pagan 04/14/2019, 2:04 PM

## 2019-04-15 ENCOUNTER — Inpatient Hospital Stay (HOSPITAL_COMMUNITY): Payer: Medicare HMO | Admitting: Speech Pathology

## 2019-04-15 ENCOUNTER — Inpatient Hospital Stay (HOSPITAL_COMMUNITY): Payer: Medicare HMO | Admitting: Physical Therapy

## 2019-04-15 ENCOUNTER — Inpatient Hospital Stay (HOSPITAL_COMMUNITY): Payer: Medicare HMO

## 2019-04-15 DIAGNOSIS — E1165 Type 2 diabetes mellitus with hyperglycemia: Secondary | ICD-10-CM

## 2019-04-15 LAB — BASIC METABOLIC PANEL
Anion gap: 6 (ref 5–15)
BUN: 22 mg/dL (ref 8–23)
CO2: 21 mmol/L — ABNORMAL LOW (ref 22–32)
Calcium: 8.5 mg/dL — ABNORMAL LOW (ref 8.9–10.3)
Chloride: 111 mmol/L (ref 98–111)
Creatinine, Ser: 1.36 mg/dL — ABNORMAL HIGH (ref 0.44–1.00)
GFR calc Af Amer: 40 mL/min — ABNORMAL LOW (ref >60)
GFR calc non Af Amer: 35 mL/min — ABNORMAL LOW (ref >60)
Glucose, Bld: 120 mg/dL — ABNORMAL HIGH (ref 70–99)
Potassium: 4.1 mmol/L (ref 3.5–5.1)
Sodium: 138 mmol/L (ref 135–145)

## 2019-04-15 LAB — GLUCOSE, CAPILLARY
Glucose-Capillary: 173 mg/dL — ABNORMAL HIGH (ref 70–99)
Glucose-Capillary: 111 mg/dL — ABNORMAL HIGH (ref 70–99)
Glucose-Capillary: 183 mg/dL — ABNORMAL HIGH (ref 70–99)
Glucose-Capillary: 168 mg/dL — ABNORMAL HIGH (ref 70–99)
Glucose-Capillary: 172 mg/dL — ABNORMAL HIGH (ref 70–99)

## 2019-04-15 LAB — CBC
HCT: 26.3 % — ABNORMAL LOW (ref 36.0–46.0)
Hemoglobin: 8.8 g/dL — ABNORMAL LOW (ref 12.0–15.0)
MCH: 34.2 pg — ABNORMAL HIGH (ref 26.0–34.0)
MCHC: 33.5 g/dL (ref 30.0–36.0)
MCV: 102.3 fL — ABNORMAL HIGH (ref 80.0–100.0)
Platelets: 110 10*3/uL — ABNORMAL LOW (ref 150–400)
RBC: 2.57 MIL/uL — ABNORMAL LOW (ref 3.87–5.11)
RDW: 14.8 % (ref 11.5–15.5)
WBC: 4.3 10*3/uL (ref 4.0–10.5)
nRBC: 0 % (ref 0.0–0.2)

## 2019-04-15 NOTE — Progress Notes (Signed)
[place] PHYSICAL MEDICINE & REHABILITATION PROGRESS NOTE   Subjective/Complaints: Patient seen sitting up in bed this morning working with therapies.  She states she slept well overnight.  Discussed hand pain with therapies.  Patient today has slightly different history regarding hand pain.  Her brace was not donned overnight.  Discussed with nursing.  ROS: Denies CP, SOB, N/V/D  Objective:   No results found. Recent Labs    04/13/19 0730 04/15/19 0504  WBC 4.3 4.3  HGB 8.7* 8.8*  HCT 26.6* 26.3*  PLT 107* 110*   Recent Labs    04/13/19 0730 04/15/19 0504  NA 141 138  K 4.5 4.1  CL 114* 111  CO2 21* 21*  GLUCOSE 108* 120*  BUN 20 22  CREATININE 1.43* 1.36*  CALCIUM 8.9 8.5*    Intake/Output Summary (Last 24 hours) at 04/15/2019 1302 Last data filed at 04/15/2019 1256 Gross per 24 hour  Intake 1360 ml  Output -  Net 1360 ml     Physical Exam: Vital Signs Blood pressure (!) 147/88, pulse 61, temperature 98.6 F (37 C), temperature source Oral, resp. rate 18, weight 80.5 kg, SpO2 95 %. Constitutional: No distress . Vital signs reviewed. HENT: Normocephalic.  Atraumatic. Eyes: EOMI. No discharge. Cardiovascular: No JVD. Respiratory: Normal effort.  No stridor. GI: Non-distended. Skin: Warm and dry.  Intact. Psych: + Flat. Musc: No edema in extremities.  No tenderness in extremities, including left finger and wrist.  Negative Tinel's at left median wrist. Neurological: Alert HOH Motor: Left upper extremity: 4/5 proximal distal, improving Left lower extremity: 4+/5 proximal to distal, stable Dysarthria improving  Assessment/Plan: 1. Functional deficits secondary to right thalamic infarct which require 3+ hours per day of interdisciplinary therapy in a comprehensive inpatient rehab setting.  Physiatrist is providing close team supervision and 24 hour management of active medical problems listed below.  Physiatrist and rehab team continue to assess barriers  to discharge/monitor patient progress toward functional and medical goals  Care Tool:  Bathing    Body parts bathed by patient: Right arm, Left arm, Chest, Abdomen, Front perineal area, Right upper leg, Left upper leg, Face, Buttocks, Right lower leg, Left lower leg   Body parts bathed by helper: Buttocks, Right lower leg, Left lower leg     Bathing assist Assist Level: Contact Guard/Touching assist Assistive Device Comment: LH sponge   Upper Body Dressing/Undressing Upper body dressing   What is the patient wearing?: Button up shirt    Upper body assist Assist Level: Minimal Assistance - Patient > 75%    Lower Body Dressing/Undressing Lower body dressing      What is the patient wearing?: Pants     Lower body assist Assist for lower body dressing: Minimal Assistance - Patient > 75%     Toileting Toileting    Toileting assist Assist for toileting: Contact Guard/Touching assist     Transfers Chair/bed transfer  Transfers assist     Chair/bed transfer assist level: Contact Guard/Touching assist     Locomotion Ambulation   Ambulation assist   Ambulation activity did not occur: Safety/medical concerns  Assist level: Contact Guard/Touching assist Assistive device: Walker-rolling Max distance: 50'   Walk 10 feet activity   Assist  Walk 10 feet activity did not occur: Safety/medical concerns  Assist level: Contact Guard/Touching assist Assistive device: Walker-rolling   Walk 50 feet activity   Assist Walk 50 feet with 2 turns activity did not occur: Safety/medical concerns  Assist level: Contact Guard/Touching assist Assistive  device: Walker-rolling    Walk 150 feet activity   Assist Walk 150 feet activity did not occur: Safety/medical concerns         Walk 10 feet on uneven surface  activity   Assist Walk 10 feet on uneven surfaces activity did not occur: Safety/medical concerns         Wheelchair     Assist Will patient use  wheelchair at discharge?: Yes Type of Wheelchair: Manual    Wheelchair assist level: Total Assistance - Patient < 25% Max wheelchair distance: 35'    Wheelchair 50 feet with 2 turns activity    Assist    Wheelchair 50 feet with 2 turns activity did not occur: Safety/medical concerns       Wheelchair 150 feet activity     Assist    Wheelchair 150 feet activity did not occur: Safety/medical concerns       Medical Problem List and Plan: 1.  Functional deficits  secondary to Right thalamic stroke             Continue CIR  -continued gains.  Baseline cognitive deficts  2.  Antithrombotics: -DVT/anticoagulation:  Mechanical:  Antiembolism stockings, knee (TED hose) Bilateral lower extremities             -antiplatelet therapy: Low dose ASA, will DC if platelets trend less than 50.  3. Pain Management: Tylenol prn  4. Mood: LCSW to follow for evaluation and support.              -antipsychotic agents: N/a 5. Neuropsych: This patient is not fully capable of making decisions on her own behalf. 6. Skin/Wound Care: Routine pressure relief measures.  7. Fluids/Electrolytes/Nutrition:      -continue protein supp for low albumin 8. HTN: Monitor BP tid and avoid hypoperfusion. Continue to hold Cozaar and HCTZ.   Labile on 10/7 9. T2DM with hyperglycemia: Hgb A1C- 8.5. Blood sugars still labile---will monitor BS ac/hs. Was on Amaryl and metformin PTA--  -metformin resumed and changed to qd per family request, put on hold on 10/6 due to Cr   -titrated amaryl to 6mg  6/27  Labile on 10/7, monitor for trend   10. Myelodysplasis:   -serial CBC's  -no clinical signs of bleeding  -hgb 8.8 on 10/7  Hemocult remains pending on 10/7 11. Acute on chronic renal failure:              Cr.  1.36 on 10/7  Encourage fluids  IVF started on 10/5  Cont to monitor 12. Hypomagnesemia:   -supplement  Mg up to 1.7 on 9/28 13. Dyslipidemia: On Crestor. 14. Constipation  -having daily  BM's 15. E Coli UTI:  -sensitive to bactrim--continue for 7 days through 10/7 16. Thrombocytopenia  Plts 110 on 10/7  Cont to monitor 17.  Left CTS  Resting WHO ordered qhs, discussed with nursing and patient again  LOS: 13 days A FACE TO FACE EVALUATION WAS PERFORMED   Stacey Harris 04/15/2019, 1:02 PM

## 2019-04-15 NOTE — Progress Notes (Signed)
Occupational Therapy Session Note  Patient Details  Name: Stacey Harris MRN: [id number] Date of Birth: [birth year]  Today's Date: 04/15/2019 OT Individual Time: 9290-9030 OT Individual Time Calculation (min): 75 min    Short Term Goals: Week 2:  OT Short Term Goal 1 (Week 2): LTG=STG 2/2 ELOS  Skilled Therapeutic Interventions/Progress Updates:    Pt received supine with no c/o pain. Pt agreeable to OT session. Pt transitioned to EOB with (S). She used RW with cueing required for UE placement to transition into standing with CGA. Pt transferred to w/c and completed oral care at the sink with set up assist. Pt reported urgent need for bathroom. Pt was incontinent of urine and then had continent BM on toilet. She was able to complete peri hygiene seated with close (S). Pt returned to w/c at the sink and completed grooming tasks and UB bathing with min cueing. Pt required min A to don/doff shirt to mange buttons. Pt required min A to don pants d/t being caught on non-slip socks. Pt able to pull pants up in standing with (S). Pt was taken down to the therapy gym and she transferred onto a therapy mat, seated. Pt completed B [place] task with large buttons and threading task. Min facilitation required and cueing for LUE use. Pt then completed functional stepping activity, stepping onto 4 in step with CGA to carryover to home threshold management. Pt returned to her w/c and to her room. Pt was left sitting up with all needs met, chair alarm set.   Therapy Documentation Precautions:  Precautions Precautions: Fall Restrictions Weight Bearing Restrictions: (P) No  Therapy/Group: Individual Therapy  Curtis Sites 04/15/2019, 8:24 AM

## 2019-04-15 NOTE — Progress Notes (Signed)
Social Work Patient ID: Stacey Harris, female   DOB: April 05, 1929, 85 y.o.   MRN: [id number]  Met with pt and daughter following team conference.  Both feel they will be ready for d/c on 10/10.  Reviewed HH and DME recommendations.  No further concerns  Georgianne Gritz, LCSW

## 2019-04-15 NOTE — Progress Notes (Signed)
Speech Language Pathology Daily Session Note  Patient Details  Name: Stacey Harris MRN: [id number] Date of Birth: 08/31/1928  Today's Date: 04/15/2019 SLP Individual Time: 0725-0820 SLP Individual Time Calculation (min): 55 min  Short Term Goals: Week 2: SLP Short Term Goal 1 (Week 2): Patient will consume current diet with minimal overt s/s of aspiration with supervision level verbal cues for use of swallowing compensatory strategies. SLP Short Term Goal 2 (Week 2): Patient will utilize external aids for recall of functional information with Min A verbal and visual cues. SLP Short Term Goal 3 (Week 2): Patient will demonstrate basic problem solving for functional and familair tasks with Min A verbal cues.  Skilled Therapeutic Interventions: Skilled treatment session focused on cognitive goals. SLP facilitated session by providing supervision level verbal cues for problem solving during tray set-up with breakfast meal of Dys. 3 textures with thin liquids. Patient consumed meal without overt s/s of aspiration with overall supervision level verbal cues needed for use of small bites. Patient independently recalled what she ordered for breakfast prior to initiation of meal and was oriented to place and time with supervision level visual cues. Patient also utilized a simplified schedule with overall supervision verbal cues. Patient participated in a basic money management task with Min A verbal cues for problem solving with task. Patient left upright in bed with alarm on and all needs within reach. Continue with current plan of care.       Pain No/Denies Pain   Therapy/Group: Individual Therapy  Attie Nawabi 04/15/2019, 3:13 PM

## 2019-04-15 NOTE — Patient Care Conference (Signed)
Inpatient RehabilitationTeam Conference and Plan of Care Update Date: 04/14/2019   Time: 10:55 am    Patient Name: Stacey Harris      Medical Record Number: [id number]  Date of Birth: 1929-03-03 Sex: Female         Room/Bed: 4W17C/4W17C-01 Payor Info: Payor: [employer] MEDICARE / Plan: [place] HMO / Product Type: *No Product type* /    Admit Date/Time:  04/02/2019  4:58 PM  Primary Diagnosis:  Right thalamic stroke [facility])  Patient Active Problem List   Diagnosis Date Noted  . Carpal tunnel syndrome of left wrist   . Labile blood pressure   . E-coli UTI   . Myelodysplastic syndrome ([place])   . Labile blood glucose   . Benign essential HTN   . Right thalamic stroke ([place]) 04/02/2019  . Stroke of right basal ganglia ([place]) 04/02/2019  . Basal ganglia stroke ([place])   . AKI (acute kidney injury) ([place])   . Hypomagnesemia   . Uncontrolled type 2 diabetes mellitus with hyperglycemia ([place])   . Stroke ([place]) 03/30/2019  . CKD (chronic kidney disease) 03/30/2019  . Recurrent UTI (urinary tract infection) 09/17/2018  . Fall at home 09/17/2018  . Frequent urination 04/29/2018  . Elevated serum creatinine 04/11/2018  . Dysuria 11/12/2017  . SOB (shortness of breath) on exertion 11/12/2017  . History of fall 08/27/2016  . Controlled type 2 diabetes mellitus with diabetic autonomic neuropathy ([place]) 02/24/2016  . Routine general medical examination at a health care facility 02/12/2016  . MDS (myelodysplastic syndrome) ([place]) 10/24/2015  . Normocytic anemia 02/11/2015  . GERD (gastroesophageal reflux disease) 08/06/2014  . Encounter for Medicare annual wellness exam 08/03/2013  . Thrombocytopenia ([place]) 08/03/2013  . Insomnia 01/26/2013  . Hypothyroid 09/17/2011  . Lumbar spinal stenosis 06/21/2011    Class: Present on Admission  . BACK PAIN, LUMBAR 08/23/2010  . POSTNASAL DRIP 08/23/2010  . OSTEOARTHRITIS, HIP 03/16/2009  . BASAL CELL CARCINOMA, FACE 07/01/2007  . BASAL CELL CARCINOMA,  FACE 07/01/2007  . DIABETIC PERIPHERAL NEUROPATHY 09/24/2006  . Hyperlipidemia 09/24/2006  . Essential hypertension 09/24/2006  . ROSACEA 09/24/2006  . INCONTINENCE, URGE 09/24/2006  . COLONOSCOPY AND REMOVAL OF LESION, HX OF 06/09/2003    Expected Discharge Date: Expected Discharge Date: 04/18/19  Team Members Present: Physician leading conference: Dr. Courtney Heys Social Worker Present: Lennart Pall, LCSW Nurse Present: Other (comment)(Dejah Blount, LPN) PT Present: Burnard Bunting, PT OT Present: Cherylynn Ridges, OT SLP Present: Weston Anna, SLP PPS Coordinator present : Gunnar Fusi, SLP     Current Status/Progress Goal Weekly Team Focus  Bowel/Bladder   Patient remains continent mostly during day hours incontinent of B/B during night shiftLBM 04/12/2019  Continent of Bowel/Bladder  Time tolieting Q2h,, QS/prn assessment, states she can not tell at night when she has to void   Swallow/Nutrition/ Hydration             ADL's   MinA/CGA overall  Supervision/CGA  self-care retraining, balance, activity tolerance, dc planning, pt/family education, L NMR   Mobility   CGA-min assist overall, gait 30-40' w/ RW  supervision-CGA, household gait  endurance, functional strength, functional balance, discharge planning and family edu   Communication             Safety/Cognition/ Behavioral Observations  Min A  Supervision      Pain   Denies pain  < + 2  QS/PRN assessment   Skin   Skin intact , warm/dry, abrasion improving left buttocks area,ecchymosis areas to  bilateral arms. hands, abdomen improving, no edema  maintain skin integrity  QS/PRN assessment, with follow up    Rehab Goals Patient on target to meet rehab goals: Yes *See Care Plan and progress notes for long and short-term goals.     Barriers to Discharge  Current Status/Progress Possible Resolutions Date Resolved   Nursing                  PT                    OT                  SLP                SW                 Discharge Planning/Teaching Needs:  Plan home with family to provide 24/7 assistance  Teaching has ben ongoing with daughter here q afternoon   Team Discussion:  No significant medical issues.  Cont b/b but with urgency.  CG - min assist with ADLs and mobility and on track to meet supervision goals.  Family ed continues with daughter.  Revisions to Treatment Plan:  NA    Medical Summary Current Status: continent B/B- some urgency- BGs great- SLP close to baseline Weekly Focus/Goal: goal d/c 10/10 with duaghter- doing family education and tuning her up prior to d/c  Barriers to Discharge: Behavior;Decreased family/caregiver support;Weight;Medication compliance;Other (comments)  Barriers to Discharge Comments: family education Possible Resolutions to Barriers: family education   Continued Need for Acute Rehabilitation Level of Care: The patient requires daily medical management by a physician with specialized training in physical medicine and rehabilitation for the following reasons: Direction of a multidisciplinary physical rehabilitation program to maximize functional independence : Yes Medical management of patient stability for increased activity during participation in an intensive rehabilitation regime.: Yes Analysis of laboratory values and/or radiology reports with any subsequent need for medication adjustment and/or medical intervention. : Yes   I attest that I was present, lead the team conference, and concur with the assessment and plan of the team.   Katheline Brendlinger 04/15/2019, 12:16 PM

## 2019-04-15 NOTE — Progress Notes (Signed)
Physical Therapy Session Note  Patient Details  Name: Stacey Harris MRN: [id number] Date of Birth: 1928/12/18  Today's Date: 04/15/2019 PT Individual Time: 1420-1520 PT Individual Time Calculation (min): 60 min   Short Term Goals: Week 2:  PT Short Term Goal 1 (Week 2): =LTGs due to ELOS  Skilled Therapeutic Interventions/Progress Updates:   Pt in recliner and agreeable to therapy, no c/o pain. Ambulated to/from toilet w/ CGA using RW, CGA for toilet transfer w/ min assist for LE garment management. Pt continent of void. Transported to outside of hospital to practice real car transfer w/ pt's daughter's car. Attempted to perform via backwards step-up technique that was practiced in yesterday's session, however pt unable 2/2 height of foot plate on SUV. Practiced technique that pt performed PTA (boosting up via hand bar, leading w/ LLE (weaker extremity). Performed w/ min assist x2 reps. Daughter present and states this was very similar to how she performed it and how much assisted was needed prior to hospitalization. Returned to unit and performed NuStep 3 min x3 reps @ level 2 w/ moderate increase in work of breathing after each bout. Ongoing education w/ pt regarding energy conservation and pacing techniques. Returned to room and ambulated to/from toilet w/ CGA, CGA toilet transfer and continent void again. Ended session in supine, all needs in reach.   Therapy Documentation Precautions:  Precautions Precautions: Fall Restrictions Weight Bearing Restrictions: No  Therapy/Group: Individual Therapy  Darlene Bartelt Clent Demark 04/15/2019, 6:39 PM

## 2019-04-16 ENCOUNTER — Inpatient Hospital Stay (HOSPITAL_COMMUNITY): Payer: Medicare HMO | Admitting: Occupational Therapy

## 2019-04-16 ENCOUNTER — Inpatient Hospital Stay (HOSPITAL_COMMUNITY): Payer: Medicare HMO | Admitting: Speech Pathology

## 2019-04-16 ENCOUNTER — Inpatient Hospital Stay (HOSPITAL_COMMUNITY): Payer: Medicare HMO | Admitting: Physical Therapy

## 2019-04-16 LAB — OCCULT BLOOD X 1 CARD TO LAB, STOOL
Fecal Occult Bld: NEGATIVE
Fecal Occult Bld: POSITIVE — AB

## 2019-04-16 LAB — GLUCOSE, CAPILLARY
Glucose-Capillary: 117 mg/dL — ABNORMAL HIGH (ref 70–99)
Glucose-Capillary: 189 mg/dL — ABNORMAL HIGH (ref 70–99)
Glucose-Capillary: 128 mg/dL — ABNORMAL HIGH (ref 70–99)
Glucose-Capillary: 142 mg/dL — ABNORMAL HIGH (ref 70–99)

## 2019-04-16 MED ORDER — POLYSACCHARIDE IRON COMPLEX 150 MG PO CAPS
150.0000 mg | ORAL_CAPSULE | Freq: Two times a day (BID) | ORAL | Status: DC
  Administered 2019-04-16 – 2019-04-17 (×4): 150 mg via ORAL
  Filled 2019-04-16 (×4): qty 1

## 2019-04-16 NOTE — Progress Notes (Signed)
Increase swelling to bilateral lower extremities noted. Ted hose on, feet have been elevated most of day and not in dependant position. Called P. Love, PA, to get weight now standing and weight in am standing. Didn't see weight from this am. Standing weight now is 74.7kg.

## 2019-04-16 NOTE — Progress Notes (Signed)
Occupational Therapy Session Note  Patient Details  Name: Stacey Harris MRN: [id number] Date of Birth: August 15, 1928  Today's Date: 04/16/2019 OT Individual Time: 1332-1400 OT Individual Time Calculation (min): 28 min   Short Term Goals: Week 2:  OT Short Term Goal 1 (Week 2): LTG=STG 2/2 ELOS  Skilled Therapeutic Interventions/Progress Updates:    Pt greeted sitting in recliner with daughter present. Pt declined to go to the bathroom. Educated pt's daughter on cuing pt for proximity to RW with ambulation and particularly with turning. Also educated daughter on cuing pt for hand placement with sit<>stands. Pt's daughter assisted with supervision level ambulation in the room to wc. OT pushed wc to therapy apartment for time management. Set-up bathroom tub shower transfer in simulated home environment. Discussed using stool to place under pt's feet to help her scoot deeper onto tub bench prior to swinging legs over, as well as adding grab bars into shower. Pt's daughter ambulated with her in hallway and gave appropriate safety cues. OT provided pt/family with handouts on home fine motor program and theraputty exercises-reviewed them with pt and daughter. Pt left seated in recliner at end of session with alarm belt on and daughter present.   Therapy Documentation Precautions:  Precautions Precautions: Fall Restrictions Weight Bearing Restrictions: No Pain: Denies pain  Therapy/Group: Individual Therapy  Valma Cava 04/16/2019, 2:03 PM

## 2019-04-16 NOTE — Progress Notes (Signed)
Occupational Therapy Session Note  Patient Details  Name: Stacey Harris MRN: [id number] Date of Birth: 1929/07/08  Today's Date: 04/16/2019 OT Individual Time: 1000-1055 OT Individual Time Calculation (min): 55 min    Short Term Goals: Week 2:  OT Short Term Goal 1 (Week 2): LTG=STG 2/2 ELOS  Skilled Therapeutic Interventions/Progress Updates:    Pt seen for OT ADL bathing/dressing session. PT asleep in recliner upon arrival, easily awoken and agreeable to tx session and denying pain.. She completed short distance ambulation within room with CGA using RW. VCs for RW management in functional context and turning completely in prep for sitting down. She completed bathing/dressing routine from w/c level at sink. Assist to manage buttons on button up shirt when doffing/donning, able to complete rest of task with VCs. UB bathing completed with set-up. LB bathing completed with min A, sit>stand at sink with CGA while completing pericare/buttock hygiene. Following VCs for hemi-dressing technique, she was able to thread LEs into pants and stood with steadying assist to pull up pants. Total A to don TED hose and on-slid socks.  She voiced need for void, ambulated into bathroom with CGA and VCs for safety awareness. Toileting task completed with steadying assist.  Grooming tasks completed from w/c level at sink with set-up assist.  Following seated rest break, she ambulated from doorway to nurses station and back without need for seated rest break, ~57ft with CGA. Pt returned to recliner at end of session, left seated with all needs in reach and chair belt alarm on.   Therapy Documentation Precautions:  Precautions Precautions: Fall Restrictions Weight Bearing Restrictions: No Pain:   No/denies pain   Therapy/Group: Individual Therapy  Lupe Handley L 04/16/2019, 6:56 AM

## 2019-04-16 NOTE — Progress Notes (Signed)
[place] PHYSICAL MEDICINE & REHABILITATION PROGRESS NOTE   Subjective/Complaints: Patient seen sitting up in a chair working with therapy this morning.  She states she slept very well overnight.  She states she wore her WHO overnight and does not have pain this morning.  Discussed importance of fluid intake with patient.  ROS: Denies CP, SOB, N/V/D  Objective:   No results found. Recent Labs    04/15/19 0504  WBC 4.3  HGB 8.8*  HCT 26.3*  PLT 110*   Recent Labs    04/15/19 0504  NA 138  K 4.1  CL 111  CO2 21*  GLUCOSE 120*  BUN 22  CREATININE 1.36*  CALCIUM 8.5*    Intake/Output Summary (Last 24 hours) at 04/16/2019 1045 Last data filed at 04/16/2019 0905 Gross per 24 hour  Intake 1000 ml  Output -  Net 1000 ml     Physical Exam: Vital Signs Blood pressure 121/61, pulse 75, temperature 98.6 F (37 C), temperature source Oral, resp. rate 18, weight 80.5 kg, SpO2 98 %. Constitutional: No distress . Vital signs reviewed. HENT: Normocephalic.  Atraumatic. Eyes: EOMI. No discharge. Cardiovascular: No JVD. Respiratory: Normal effort.  No stridor. GI: Non-distended. Skin: Warm and dry.  Intact. Psych: Flat. Musc: No edema in extremities.  No tenderness in extremities, including left wrist. Neurological: Alert HOH Motor: Left upper extremity: 4/5 proximal distal, gradually improving Left lower extremity: 4+/5 proximal to distal, unchanged Dysarthria improving  Assessment/Plan: 1. Functional deficits secondary to right thalamic infarct which require 3+ hours per day of interdisciplinary therapy in a comprehensive inpatient rehab setting.  Physiatrist is providing close team supervision and 24 hour management of active medical problems listed below.  Physiatrist and rehab team continue to assess barriers to discharge/monitor patient progress toward functional and medical goals  Care Tool:  Bathing    Body parts bathed by patient: Right arm, Left arm,  Chest, Abdomen, Front perineal area, Right upper leg, Left upper leg, Face, Buttocks, Right lower leg, Left lower leg   Body parts bathed by helper: Buttocks, Right lower leg, Left lower leg     Bathing assist Assist Level: Contact Guard/Touching assist Assistive Device Comment: LH sponge   Upper Body Dressing/Undressing Upper body dressing   What is the patient wearing?: Button up shirt    Upper body assist Assist Level: Minimal Assistance - Patient > 75%    Lower Body Dressing/Undressing Lower body dressing      What is the patient wearing?: Pants     Lower body assist Assist for lower body dressing: Minimal Assistance - Patient > 75%     Toileting Toileting    Toileting assist Assist for toileting: Contact Guard/Touching assist     Transfers Chair/bed transfer  Transfers assist     Chair/bed transfer assist level: Contact Guard/Touching assist     Locomotion Ambulation   Ambulation assist   Ambulation activity did not occur: Safety/medical concerns  Assist level: Contact Guard/Touching assist Assistive device: Walker-rolling Max distance: 20'   Walk 10 feet activity   Assist  Walk 10 feet activity did not occur: Safety/medical concerns  Assist level: Minimal Assistance - Patient > 75% Assistive device: Walker-rolling   Walk 50 feet activity   Assist Walk 50 feet with 2 turns activity did not occur: Safety/medical concerns  Assist level: Contact Guard/Touching assist Assistive device: Walker-rolling    Walk 150 feet activity   Assist Walk 150 feet activity did not occur: Safety/medical concerns  Walk 10 feet on uneven surface  activity   Assist Walk 10 feet on uneven surfaces activity did not occur: Safety/medical concerns         Wheelchair     Assist Will patient use wheelchair at discharge?: Yes Type of Wheelchair: Manual    Wheelchair assist level: Total Assistance - Patient < 25% Max wheelchair distance:  46'    Wheelchair 50 feet with 2 turns activity    Assist    Wheelchair 50 feet with 2 turns activity did not occur: Safety/medical concerns       Wheelchair 150 feet activity     Assist    Wheelchair 150 feet activity did not occur: Safety/medical concerns       Medical Problem List and Plan: 1.  Functional deficits  secondary to Right thalamic stroke             Continue CIR  -continued gains.  Baseline cognitive deficts  2.  Antithrombotics: -DVT/anticoagulation:  Mechanical:  Antiembolism stockings, knee (TED hose) Bilateral lower extremities             -antiplatelet therapy: Low dose ASA, will DC if platelets trend less than 50.  3. Pain Management: Tylenol prn  4. Mood: LCSW to follow for evaluation and support.              -antipsychotic agents: N/a 5. Neuropsych: This patient is not fully capable of making decisions on her own behalf. 6. Skin/Wound Care: Routine pressure relief measures.  7. Fluids/Electrolytes/Nutrition:      -continue protein supp for low albumin 8. HTN: Monitor BP tid and avoid hypoperfusion. Continue to hold Cozaar and HCTZ.   Labile on 10/8, continue to monitor for trend 9. T2DM with hyperglycemia: Hgb A1C- 8.5. Blood sugars still labile---will monitor BS ac/hs. Was on Amaryl and metformin PTA  -metformin resumed and changed to qd per family request, put on hold on 10/6 due to Cr   -titrated amaryl to 6mg  6/27  Remains labile on 10/8, continue to monitor for trend 10. Myelodysplasis:   -serial CBC's  -no clinical signs of bleeding  -hgb 8.8 on 10/7  Hemocult r negative on 10/8 11. Acute on chronic renal failure:              Cr.  1.36 on 10/7  Encourage fluids, educated patient again now that off IVF  IVF started on 10/5, DC'd on 10/8  Cont to monitor 12. Hypomagnesemia:   -supplement  Mg up to 1.7 on 9/28 13. Dyslipidemia: On Crestor. 14. Constipation  Cont meds as needed 15. E Coli UTI:  -sensitive to bactrim, completed  course through 10/7 16. Thrombocytopenia  Plts 110 on 10/7  Cont to monitor 17.  Left CTS  Resting WHO ordered qhs, discussed with nursing and patient again  Improving with brace  LOS: 14 days A FACE TO FACE EVALUATION WAS PERFORMED   [name] 04/16/2019, 10:45 AM

## 2019-04-16 NOTE — Progress Notes (Signed)
Speech Language Pathology Daily Session Note  Patient Details  Name: Stacey Harris MRN: [id number] Date of Birth: 11/30/1928  Today's Date: 04/16/2019 SLP Individual Time: 1135-1200 SLP Individual Time Calculation (min): 25 min  Short Term Goals: Week 2: SLP Short Term Goal 1 (Week 2): Patient will consume current diet with minimal overt s/s of aspiration with supervision level verbal cues for use of swallowing compensatory strategies. SLP Short Term Goal 2 (Week 2): Patient will utilize external aids for recall of functional information with Min A verbal and visual cues. SLP Short Term Goal 3 (Week 2): Patient will demonstrate basic problem solving for functional and familair tasks with Min A verbal cues.  Skilled Therapeutic Interventions: Skilled treatment session focused on cognitive goals. SLP facilitated session by providing Min A verbal cues for recall of events from previous therapy sessions. Patient was also reading a book upon clinician's arrival and recalled details of the book with overall Mod A verbal cues. SLP provided education in regards to memory compensatory strategies and how to incorporate strategies at home as well as the importance of staying active at home. She verbalized understanding. Patient left upright in recliner with alarm on and all needs within reach. Continue with current plan of care.      Pain No/Denies Pain   Therapy/Group: Individual Therapy  Lashonda Sonneborn 04/16/2019, 12:06 PM

## 2019-04-16 NOTE — Progress Notes (Signed)
Physical Therapy Discharge Summary  Patient Details  Name: Stacey Harris MRN: [id number] Date of Birth: [birth year]  Today's Date: 04/17/2019 PT Individual Time: 0800-0855 PT Individual Time Calculation (min): 55 min   Pt in supine and agreeable to therapy, denies pain. Bed mobility w/ supervision from flat bed surface and w/o hospital bed features. Maintained static/dynamic sitting balance w/ supervision while eating breakfast. Ambulated to/from toilet and performed toilet transfer w/ close supervision. Donned knee-high TEDs for BLE swelling management. Total assist w/c transport to/from therapy gym. Practiced stair negotiation and gait as detailed below. Verbal cues for gait pattern and safety on stairs. Worked on dynamic standing balance while performing peg board task requiring BUE reaching w/ trunk rotation, close supervision-CGA in multiple bouts of 1-2 min of standing. Mild increase in work of breathing w/ all mobility this session. Returned to room and ended session in recliner, all needs in reach.   Patient has met 9 of 9 long term goals due to improved activity tolerance, improved balance, improved postural control, increased strength, functional use of  left upper extremity and left lower extremity, improved attention, improved awareness and improved coordination.  Patient to discharge at an ambulatory level Supervision (CGA).  Patient's care partner is independent to provide the necessary physical assistance at discharge. Pt's daughter has been present during multiple therapy sessions (including real car transfer practice) and feels comfortable and is safe to provide this level of assist at d/c including mid assist for car transfers. Therapist has additionally educated both daughter and pt on energy conservation strategies, importance of pacing, and importance of frequent toileting to fall risk w/ void urgency.   Reasons goals not met: n/a  Recommendation:  Patient will benefit from  ongoing skilled PT services in home health setting to continue to advance safe functional mobility, address ongoing impairments in functional strength, balance, endurance, and LLE NMR, and minimize fall risk.  Equipment: No equipment provided (pt has RW and w/c already)  Reasons for discharge: treatment goals met and discharge from hospital  Patient/family agrees with progress made and goals achieved: Yes  PT Discharge Precautions/Restrictions Precautions Precautions: Fall Restrictions Weight Bearing Restrictions: No Vision/Perception  Perception Perception: Within Functional Limits Praxis Praxis: Intact  Cognition Overall Cognitive Status: History of cognitive impairments - at baseline Arousal/Alertness: Awake/alert Orientation Level: Oriented X4 Focused Attention: Appears intact Sustained Attention: Appears intact Memory: Impaired Memory Impairment: Retrieval deficit;Decreased short term memory Decreased Short Term Memory: Verbal complex;Functional complex Awareness: Appears intact Problem Solving: Impaired Safety/Judgment: Appears intact Sensation Sensation Light Touch: Appears Intact(BLEs) Coordination Gross Motor Movements are Fluid and Coordinated: No Coordination and Movement Description: globally impaired 2/2 generalized weakness and deconditioning Motor  Motor Motor: Hemiplegia Motor - Discharge Observations: mild L hemi remains  Mobility Bed Mobility Bed Mobility: Rolling Right;Rolling Left;Supine to Sit;Sit to Supine Rolling Right: Supervision/verbal cueing Rolling Left: Supervision/Verbal cueing Supine to Sit: Supervision/Verbal cueing Sit to Supine: Supervision/Verbal cueing Transfers Transfers: Sit to Stand;Stand to Sit;Stand Pivot Transfers Sit to Stand: Supervision/Verbal cueing Stand to Sit: Supervision/Verbal cueing Stand Pivot Transfers: Supervision/Verbal cueing Transfer (Assistive device): None Locomotion  Gait Gait Assistance: Contact  Guard/Touching assist Gait Distance (Feet): 50 Feet Assistive device: Rolling walker Gait Gait: Yes Gait Pattern: Impaired Gait Pattern: Shuffle;Decreased dorsiflexion - left;Trunk flexed;Poor foot clearance - left;Poor foot clearance - right Gait velocity: decreased Stairs / Additional Locomotion Stairs: Yes Stairs Assistance: Contact Guard/Touching assist Stair Management Technique: Two rails Number of Stairs: 4 Height of Stairs: 6 Wheelchair Mobility Wheelchair Mobility: No  Trunk/Postural Assessment  Cervical Assessment Cervical Assessment: Exceptions to WFL(forward head) Thoracic Assessment Thoracic Assessment: Exceptions to WFL(mild kyphosis) Lumbar Assessment Lumbar Assessment: Exceptions to WFL(posterior pelvic tilt) Postural Control Postural Control: Deficits on evaluation(delayed/insufficient, although improved since eval)  Balance Balance Balance Assessed: Yes Static Sitting Balance Static Sitting - Level of Assistance: 5: Stand by assistance Dynamic Sitting Balance Dynamic Sitting - Level of Assistance: 5: Stand by assistance Static Standing Balance Static Standing - Level of Assistance: 5: Stand by assistance Dynamic Standing Balance Dynamic Standing - Level of Assistance: 4: Min assist(CGA) Extremity Assessment  RLE Assessment RLE Assessment: Within Functional Limits LLE Assessment LLE Assessment: Exceptions to [facility] General Strength Comments: Globally 4- to 4/5    Dezzie Badilla K Amar Sippel 04/17/2019, 9:35 AM

## 2019-04-16 NOTE — Progress Notes (Signed)
Physical Therapy Session Note  Patient Details  Name: Stacey Harris MRN: [id number] Date of Birth: [birth year]  Today's Date: 04/16/2019 PT Individual Time: 0800-0854 PT Individual Time Calculation (min): 54 min   Short Term Goals: Week 2:  PT Short Term Goal 1 (Week 2): =LTGs due to ELOS  Skilled Therapeutic Interventions/Progress Updates:   Pt received eating breakfast in recliner, agreeable to therapy and denies pain. In care of NT and therapist took over to provide supervision while finishing w/ breakfast. Ambulated to/from toilet w/ CGA and toilet transfer w/ CGA. Pt continent of bowel and bladder. Stood at sink w/ supervision to wash hands, brush hair, and wash face. Total assist w/c transport to/from therapy gym. Practiced car transfer w/ simulated footplate, performed w/ min assist x2 and tactile and verbal cues for technique. Worked on LLE lateral step-ups in parallel bars to 6" step, blocked practice for car transfer, min assist-CGA. Returned to room total assist via w/c, ambulated to recliner on other side of room w/ CGA using RW. Ended session in recliner, all needs in reach.   Therapy Documentation Precautions:  Precautions Precautions: Fall Restrictions Weight Bearing Restrictions: No Vital Signs: Therapy Vitals Temp: 98.6 F (37 C) Temp Source: Oral Pulse Rate: 75 Resp: 18 BP: 121/61 Patient Position (if appropriate): Lying Oxygen Therapy SpO2: 98 % O2 Device: Room Air  Therapy/Group: Individual Therapy  Chiron Campione K Mostyn Varnell 04/16/2019, 9:01 AM

## 2019-04-17 ENCOUNTER — Inpatient Hospital Stay (HOSPITAL_COMMUNITY): Payer: Medicare HMO | Admitting: Occupational Therapy

## 2019-04-17 ENCOUNTER — Encounter: Payer: Medicare HMO | Admitting: Family Medicine

## 2019-04-17 ENCOUNTER — Encounter (HOSPITAL_COMMUNITY): Payer: Medicare HMO | Admitting: Speech Pathology

## 2019-04-17 ENCOUNTER — Inpatient Hospital Stay (HOSPITAL_COMMUNITY): Payer: Medicare HMO | Admitting: Physical Therapy

## 2019-04-17 LAB — CBC
HCT: 28 % — ABNORMAL LOW (ref 36.0–46.0)
Hemoglobin: 9.2 g/dL — ABNORMAL LOW (ref 12.0–15.0)
MCH: 34.1 pg — ABNORMAL HIGH (ref 26.0–34.0)
MCHC: 32.9 g/dL (ref 30.0–36.0)
MCV: 103.7 fL — ABNORMAL HIGH (ref 80.0–100.0)
Platelets: 107 10*3/uL — ABNORMAL LOW (ref 150–400)
RBC: 2.7 MIL/uL — ABNORMAL LOW (ref 3.87–5.11)
RDW: 14.9 % (ref 11.5–15.5)
WBC: 4.8 10*3/uL (ref 4.0–10.5)
nRBC: 0 % (ref 0.0–0.2)

## 2019-04-17 LAB — GLUCOSE, CAPILLARY
Glucose-Capillary: 112 mg/dL — ABNORMAL HIGH (ref 70–99)
Glucose-Capillary: 123 mg/dL — ABNORMAL HIGH (ref 70–99)
Glucose-Capillary: 150 mg/dL — ABNORMAL HIGH (ref 70–99)
Glucose-Capillary: 140 mg/dL — ABNORMAL HIGH (ref 70–99)

## 2019-04-17 MED ORDER — ACETAMINOPHEN 325 MG PO TABS: 325.0000 mg | ORAL_TABLET | ORAL | Status: AC | PRN

## 2019-04-17 MED ORDER — MAGNESIUM GLUCONATE 500 MG PO TABS: 500.0000 mg | ORAL_TABLET | Freq: Every day | ORAL | 1 refills | Status: DC

## 2019-04-17 MED ORDER — POLYSACCHARIDE IRON COMPLEX 150 MG PO CAPS: 150.0000 mg | ORAL_CAPSULE | Freq: Two times a day (BID) | ORAL | 0 refills | Status: DC

## 2019-04-17 NOTE — Progress Notes (Signed)
Occupational Therapy Discharge Summary  Patient Details  Name: Stacey Harris MRN: [id number] Date of Birth: Jul 11, 1928  Today's Date: 04/17/2019 OT Individual Time: 0930-1100 OT Individual Time Calculation (min): 90 min    Pt greeted seated in recliner and agreeable to OT treatment session focused on increased independence with BADL tasks. Pt ambulated into bathroom w/ RW and close supervision with min verbal cues for RW positioning when turning to sit on shower chair. Bathing completed at supervision level using lateral leans to wash buttocks and LH sponge to wash lower legs. Dressing completed from EOB with overall supervision and use of stool to bring LEs closer and pt able to don socks today. Grooming tasks at the sink with supervision and improved fine motor coordination and grip strength to open containers. Pt dryed hair in sitting using alternating UEs. Pt brought to therapy gym and completed 9-hole PEG test: L- 1.15, R- 56. Discussed home set-up, OT goals, and pt progress. Pt then reported need to urinate. Pt ambulated RW with close supervision into bathroom, voided bladder, and completed 3/3 toileting steps with increased time and supervision. Pt left seated in recliner at end of session with call bell in reach, chair alarm on, and needs met.    Patient has met 13 of 13 long term goals due to improved activity tolerance, improved balance, postural control, ability to compensate for deficits, functional use of  LEFT upper and LEFT lower extremity, improved attention, improved awareness and improved coordination.  Patient to discharge at overall Supervision level.  Patient's care partner is independent to provide the necessary physical and cognitive assistance at discharge.    Reasons goals not met: n/a  Recommendation:  Patient will benefit from ongoing skilled OT services in home health setting to continue to advance functional skills in the area of BADL.  Equipment: 3-in-1  BSC  Reasons for discharge: treatment goals met and discharge from hospital  Patient/family agrees with progress made and goals achieved: Yes  OT Discharge Precautions/Restrictions  Precautions Precautions: Fall Restrictions Weight Bearing Restrictions: No Pain  none/denies pain ADL ADL Eating: Set up Grooming: Supervision/safety Upper Body Bathing: Supervision/safety Lower Body Bathing: Supervision/safety Upper Body Dressing: Supervision/safety Lower Body Dressing: Supervision/safety Toileting: Supervision/safety Toilet Transfer: Close supervision Tub/Shower Transfer: Contact guard Vision Baseline Vision/History: Wears glasses Wears Glasses: Reading only Patient Visual Report: No change from baseline Vision Assessment?: No apparent visual deficits Perception  Perception: Within Functional Limits Praxis Praxis: Intact Cognition Overall Cognitive Status: History of cognitive impairments - at baseline Arousal/Alertness: Awake/alert Orientation Level: Oriented X4 Memory: Impaired Problem Solving: Impaired Safety/Judgment: Appears intact Sensation Sensation Light Touch: Appears Intact Coordination Gross Motor Movements are Fluid and Coordinated: No Fine Motor Movements are Fluid and Coordinated: No Coordination and Movement Description: generalized weakness, decreased smoothness and accuracy but improved since eval Motor  Motor Motor: Hemiplegia Motor - Discharge Observations: mild L hemi remains Mobility  Bed Mobility Bed Mobility: Rolling Right;Rolling Left;Supine to Sit;Sit to Supine Rolling Right: Supervision/verbal cueing Rolling Left: Supervision/Verbal cueing Supine to Sit: Supervision/Verbal cueing Sit to Supine: Supervision/Verbal cueing Transfers Sit to Stand: Supervision/Verbal cueing Stand to Sit: Supervision/Verbal cueing  Trunk/Postural Assessment  Cervical Assessment Cervical Assessment: Exceptions to WFL(forward head) Thoracic  Assessment Thoracic Assessment: Exceptions to WFL(mild kyphosis) Lumbar Assessment Lumbar Assessment: Exceptions to WFL(posterior pelvic tilt) Postural Control Postural Control: Deficits on evaluation(delayed/insufficient, although improved since eval)  Balance Balance Balance Assessed: Yes Static Sitting Balance Static Sitting - Level of Assistance: 5: Stand by assistance Dynamic Sitting Balance Dynamic  Sitting - Level of Assistance: 5: Stand by assistance Static Standing Balance Static Standing - Level of Assistance: 5: Stand by assistance Dynamic Standing Balance Dynamic Standing - Level of Assistance: 5: Stand by assistance(intermittent CGA) Extremity/Trunk Assessment RUE Assessment RUE Assessment: Within Functional Limits LUE Assessment LUE Assessment: Within Functional Limits General Strength Comments: Strength much improved since eval. L arm is 4/5 but able to use WFL LUE Body System: Neuro;Ortho Brunstrum levels for arm and hand: Arm;Hand Brunstrum level for arm: Stage V Relative Independence from Synergy Brunstrum level for hand: Stage VI Isolated joint movements LUE Strength Left Shoulder Flexion: 4/5   Daneen Schick Jodye Scali 04/17/2019, 3:33 PM

## 2019-04-17 NOTE — Discharge Instructions (Signed)
Inpatient Rehab Discharge Instructions  LINDI TRIAS Discharge date and time: 04/17/19    Activities/Precautions/ Functional Status: Activity: no lifting, driving, or strenuous exercise for till cleared by MD Diet: soft foods. Limit distractions at meals. Diabetic restrictions.  Wound Care: none needed   Functional status:  ___ No restrictions     ___ Walk up steps independently _X__ 24/7 supervision/assistance   ___ Walk up steps with assistance ___ Intermittent supervision/assistance  ___ Bathe/dress independently ___ Walk with walker     ___ Bathe/dress with assistance ___ Walk Independently    ___ Shower independently ___ Walk with assistance    _X__ Shower with assistance _X__ No alcohol     ___ Return to work/school ________   Special Instructions: 1. Monitor blood sugars 1-2 times a day and record.  2. Wear left wrist brace at nights for support/pain control   COMMUNITY REFERRALS UPON DISCHARGE:    Home Health:   PT     OT                      Agency:  [facility]   Phone: [phone]   Medical Equipment/Items Ordered:  3n1 commode                                                      Agency/Supplier:  [place] @ [phone]     My questions have been answered and I understand these instructions. I will adhere to these goals and the provided educational materials after my discharge from the hospital.  Patient/Caregiver Signature _______________________________ Date __________  Clinician Signature _______________________________________ Date __________  Please bring this form and your medication list with you to all your follow-up doctor's appointments.

## 2019-04-17 NOTE — Progress Notes (Signed)
Social Work Discharge Note   The overall goal for the admission was met for:   Discharge location: Yes - home with daughter to provide assistance  Length of Stay: Yes - 15 days  Discharge activity level: Yes - supervision  Home/community participation: Yes  Services provided included: MD, RD, PT, OT, SLP, RN, Pharmacy and [place]: [employer]  Follow-up services arranged: Home Health: PT, OT via [facility], DME: 3n1 commode via [place] and Patient/Family has no preference for HH/DME agencies  Comments (or additional information):      Contact info:  Daughter, Lidia Collum @ [phone]  Patient/Family verbalized understanding of follow-up arrangements: Yes  Individual responsible for coordination of the follow-up plan: pt/ dtr  Confirmed correct DME delivered: Jakeim Sedore 04/17/2019    Emiliano Welshans

## 2019-04-17 NOTE — Progress Notes (Signed)
Speech Language Pathology Discharge Summary  Patient Details  Name: Stacey Harris MRN: [id number] Date of Birth: Jun 09, 1929  Today's Date: 04/17/2019 SLP Individual Time: 1300-1325 SLP Individual Time Calculation (min): 25 min   Skilled Therapeutic Interventions:  Skilled treatment session focused on completion of family education with the patient's daughter (via telephone) and cognitive goals. SLP facilitated session by administering the [place]. Patient scored 19/30 points with a score of 26 or above considered normal. Patient demonstrated deficits in executive functioning and recall. SLP called the patient's daughter and educated her on patient's current cognitive functioning and strategies to utilize at home to maximize recall and overall safety at home. She verbalized understanding. Patient left upright in recliner with alarm on and all needs within reach. Continue with current plan of care.   Patient has met 4 of 4 long term goals.  Patient to discharge at overall Supervision;Min level.   Reasons goals not met: N/A   Clinical Impression/Discharge Summary: Patient has made excellent gains and has met 4 of 4 LTGs this admission. Currently, patient is consuming her baseline diet of Dys. 3 textures with thin liquids with minimal overt s/s of aspiration and is overall mod I for use of swallowing compensatory strategies. Patient demonstrates improved verbal expression and demonstrates 100% intelligibility at the sentence level independently. Patient also demonstrates improved ability to utilize external aids to maximize recall of functional information as well as basic problem solving. Patient and family education is complete and patient will discharge home with 24 hour supervision. Suspect patient is at her baseline level of cognitive functioning, therefore, skilled SLP f/u is not warranted at this time. Patient and her daughter verbalize understanding.   Care Partner:  Caregiver Able to  Provide Assistance: Yes  Type of Caregiver Assistance: Physical;Cognitive  Recommendation:  24 hour supervision/assistance      Equipment: N/A   Reasons for discharge: Treatment goals met;Discharged from hospital   Patient/Family Agrees with Progress Made and Goals Achieved: Yes    Bert Ptacek 04/17/2019, 6:27 AM

## 2019-04-17 NOTE — Progress Notes (Signed)
[place] PHYSICAL MEDICINE & REHABILITATION PROGRESS NOTE   Subjective/Complaints: Patient seen sitting up in her chair this AM, working with therapies.  She states she slept well overnight. She is aware of d/c tomorrow.   ROS: Denies CP, SOB, N/V/D  Objective:   No results found. Recent Labs    04/15/19 0504  WBC 4.3  HGB 8.8*  HCT 26.3*  PLT 110*   Recent Labs    04/15/19 0504  NA 138  K 4.1  CL 111  CO2 21*  GLUCOSE 120*  BUN 22  CREATININE 1.36*  CALCIUM 8.5*    Intake/Output Summary (Last 24 hours) at 04/17/2019 1048 Last data filed at 04/16/2019 1838 Gross per 24 hour  Intake 480 ml  Output -  Net 480 ml     Physical Exam: Vital Signs Blood pressure 134/87, pulse 83, temperature 98.4 F (36.9 C), resp. rate 18, weight 75.7 kg, SpO2 97 %. Constitutional: No distress . Vital signs reviewed. HENT: Normocephalic.  Atraumatic. Eyes: EOMI. No discharge. Cardiovascular: No JVD. Respiratory: Normal effort.  No stridor. GI: Non-distended. Skin: Warm and dry.  Intact. Psych: Flat. Musc: No edema in extremities.  No tenderness in extremities. Neurological: Alert HOH Motor: Left upper extremity: 4/5 proximal distal, continues to improve Left lower extremity: 4+/5 proximal to distal, stable Dysarthria improving  Assessment/Plan: 1. Functional deficits secondary to right thalamic infarct which require 3+ hours per day of interdisciplinary therapy in a comprehensive inpatient rehab setting.  Physiatrist is providing close team supervision and 24 hour management of active medical problems listed below.  Physiatrist and rehab team continue to assess barriers to discharge/monitor patient progress toward functional and medical goals  Care Tool:  Bathing    Body parts bathed by patient: Right arm, Left arm, Chest, Abdomen, Front perineal area, Right upper leg, Left upper leg, Face, Buttocks, Left lower leg, Right lower leg   Body parts bathed by helper: Right  lower leg, Left lower leg     Bathing assist Assist Level: Supervision/Verbal cueing Assistive Device Comment: LH sponge   Upper Body Dressing/Undressing Upper body dressing   What is the patient wearing?: Pull over shirt    Upper body assist Assist Level: Set up assist    Lower Body Dressing/Undressing Lower body dressing      What is the patient wearing?: Pants     Lower body assist Assist for lower body dressing: Supervision/Verbal cueing     Toileting Toileting    Toileting assist Assist for toileting: Supervision/Verbal cueing     Transfers Chair/bed transfer  Transfers assist     Chair/bed transfer assist level: Supervision/Verbal cueing     Locomotion Ambulation   Ambulation assist   Ambulation activity did not occur: Safety/medical concerns  Assist level: Contact Guard/Touching assist Assistive device: Walker-rolling Max distance: 50'   Walk 10 feet activity   Assist  Walk 10 feet activity did not occur: Safety/medical concerns  Assist level: Contact Guard/Touching assist Assistive device: Walker-rolling   Walk 50 feet activity   Assist Walk 50 feet with 2 turns activity did not occur: Safety/medical concerns  Assist level: Contact Guard/Touching assist Assistive device: Walker-rolling    Walk 150 feet activity   Assist Walk 150 feet activity did not occur: Safety/medical concerns         Walk 10 feet on uneven surface  activity   Assist Walk 10 feet on uneven surfaces activity did not occur: Safety/medical concerns         Wheelchair  Assist Will patient use wheelchair at discharge?: No Type of Wheelchair: Manual Wheelchair activity did not occur: N/A  Wheelchair assist level: Total Assistance - Patient < 25% Max wheelchair distance: 27'    Wheelchair 50 feet with 2 turns activity    Assist    Wheelchair 50 feet with 2 turns activity did not occur: N/A       Wheelchair 150 feet activity      Assist    Wheelchair 150 feet activity did not occur: N/A       Medical Problem List and Plan: 1.  Functional deficits  secondary to Right thalamic stroke             Continue CIR, plan for d/c tomorrow  -continued gains.  Baseline cognitive deficts  2.  Antithrombotics: -DVT/anticoagulation:  Mechanical:  Antiembolism stockings, knee (TED hose) Bilateral lower extremities             -antiplatelet therapy: Low dose ASA, will DC if platelets trend less than 50.  3. Pain Management: Tylenol prn  4. Mood: LCSW to follow for evaluation and support.              -antipsychotic agents: N/a 5. Neuropsych: This patient is not fully capable of making decisions on her own behalf. 6. Skin/Wound Care: Routine pressure relief measures.  7. Fluids/Electrolytes/Nutrition:      -continue protein supp for low albumin 8. HTN: Monitor BP tid and avoid hypoperfusion. Continue to hold Cozaar and HCTZ.   Labile on 10/9 9. T2DM with hyperglycemia: Hgb A1C- 8.5. Blood sugars still labile---will monitor BS ac/hs. Was on Amaryl and metformin PTA  -metformin resumed and changed to qd per family request, put on hold on 10/6 due to Cr   -titrated amaryl to 6mg  6/27  Labile on 10/9, monitor for trend 10. Myelodysplasis:   -serial CBC's  -no clinical signs of bleeding  Iron ordered.  -hgb 8.8 on 10/7, labs pending  Hemoccult positive on 10/9, previously negative and hemoglobin stable 11. Acute on chronic renal failure:              Cr.  1.36 on 10/7  Encourage fluids, educated patient again now that off IVF  IVF started on 10/5, DC'd on 10/8  Cont to monitor 12. Hypomagnesemia:   -supplement  Mg up to 1.7 on 9/28 13. Dyslipidemia: On Crestor. 14. Constipation  Cont meds as needed 15. E Coli UTI:  -sensitive to bactrim, completed course through 10/7 16. Thrombocytopenia  Plts 110 on 10/7  Cont to monitor 17.  Left CTS  Resting WHO ordered qhs, discussed with nursing and patient  again  Improved with brace  LOS: 15 days A FACE TO FACE EVALUATION WAS PERFORMED  Stacey Harris Stacey Harris 04/17/2019, 10:48 AM

## 2019-04-18 LAB — GLUCOSE, CAPILLARY: Glucose-Capillary: 99 mg/dL (ref 70–99)

## 2019-04-18 NOTE — Progress Notes (Signed)
[place] PHYSICAL MEDICINE & REHABILITATION PROGRESS NOTE   Subjective/Complaints:  Pt aware of d/c , is HOH  No pain c/os  ROS: Denies CP, SOB, N/V/D  Objective:   No results found. Recent Labs    04/17/19 1151  WBC 4.8  HGB 9.2*  HCT 28.0*  PLT 107*   No results for input(s): NA, K, CL, CO2, GLUCOSE, BUN, CREATININE, CALCIUM in the last 72 hours.  Intake/Output Summary (Last 24 hours) at 04/18/2019 0839 Last data filed at 04/18/2019 0715 Gross per 24 hour  Intake 664 ml  Output -  Net 664 ml     Physical Exam: Vital Signs Blood pressure (!) 130/58, pulse 66, temperature 99.4 F (37.4 C), temperature source Oral, resp. rate 16, weight 76 kg, SpO2 96 %. Constitutional: No distress . Vital signs reviewed. HENT: Normocephalic.  Atraumatic. Eyes: EOMI. No discharge. Cardiovascular: No JVD. Respiratory: Normal effort.  No stridor. GI: Non-distended. Skin: Warm and dry.  Intact. Psych: Flat. Musc: No edema in extremities.  No tenderness in extremities. Neurological: Alert HOH Motor: Left upper extremity: 4/5 proximal distal, continues to improve Left lower extremity: 4+/5 proximal to distal, stable  Assessment/Plan: 1. Functional deficits secondary to right thalamic infarct  Stable for D/C today F/u PCP in 3-4 weeks F/u PM&R 2 weeks See D/C summary See D/C instructions Care Tool:  Bathing    Body parts bathed by patient: Right arm, Left arm, Chest, Abdomen, Front perineal area, Right upper leg, Left upper leg, Face, Buttocks, Left lower leg, Right lower leg   Body parts bathed by helper: Right lower leg, Left lower leg     Bathing assist Assist Level: Supervision/Verbal cueing Assistive Device Comment: LH sponge   Upper Body Dressing/Undressing Upper body dressing   What is the patient wearing?: Pull over shirt    Upper body assist Assist Level: Set up assist    Lower Body Dressing/Undressing Lower body dressing      What is the patient  wearing?: Pants     Lower body assist Assist for lower body dressing: Supervision/Verbal cueing     Toileting Toileting    Toileting assist Assist for toileting: Supervision/Verbal cueing     Transfers Chair/bed transfer  Transfers assist     Chair/bed transfer assist level: Supervision/Verbal cueing     Locomotion Ambulation   Ambulation assist   Ambulation activity did not occur: Safety/medical concerns  Assist level: Contact Guard/Touching assist Assistive device: Walker-rolling Max distance: 50'   Walk 10 feet activity   Assist  Walk 10 feet activity did not occur: Safety/medical concerns  Assist level: Contact Guard/Touching assist Assistive device: Walker-rolling   Walk 50 feet activity   Assist Walk 50 feet with 2 turns activity did not occur: Safety/medical concerns  Assist level: Contact Guard/Touching assist Assistive device: Walker-rolling    Walk 150 feet activity   Assist Walk 150 feet activity did not occur: Safety/medical concerns         Walk 10 feet on uneven surface  activity   Assist Walk 10 feet on uneven surfaces activity did not occur: Safety/medical concerns         Wheelchair     Assist Will patient use wheelchair at discharge?: No Type of Wheelchair: Manual Wheelchair activity did not occur: N/A  Wheelchair assist level: Total Assistance - Patient < 25% Max wheelchair distance: 84'    Wheelchair 50 feet with 2 turns activity    Assist    Wheelchair 50 feet with 2 turns  activity did not occur: N/A       Wheelchair 150 feet activity     Assist    Wheelchair 150 feet activity did not occur: N/A       Medical Problem List and Plan: 1.  Functional deficits  secondary to Right thalamic stroke             Continue CIR, plan for d/c today   -continued gains.  Baseline cognitive deficts  2.  Antithrombotics: -DVT/anticoagulation:  Mechanical:  Antiembolism stockings, knee (TED hose)  Bilateral lower extremities             -antiplatelet therapy: Low dose ASA, will DC if platelets trend less than 50.  3. Pain Management: Tylenol prn  4. Mood: LCSW to follow for evaluation and support.              -antipsychotic agents: N/a 5. Neuropsych: This patient is not fully capable of making decisions on her own behalf. 6. Skin/Wound Care: Routine pressure relief measures.  7. Fluids/Electrolytes/Nutrition:      -continue protein supp for low albumin 8. HTN: Monitor BP tid and avoid hypoperfusion. Continue to hold Cozaar and HCTZ.   Labile on 10/9 9. T2DM with hyperglycemia: Hgb A1C- 8.5. Blood sugars still labile---will monitor BS ac/hs. Was on Amaryl and metformin PTA  -metformin resumed and changed to qd per family request, put on hold on 10/6 due to Cr   -titrated amaryl to 6mg  6/27 CBG (last 3)  Recent Labs    04/17/19 1625 04/17/19 2137 04/18/19 0634  GLUCAP 150* 140* 99   10. Myelodysplasis:   -serial CBC's  -no clinical signs of bleeding  Iron ordered.  -hgb 8.8 on 10/7, stable at 9.2   Hemoccult positive on 10/9, previously negative and hemoglobin stable, f/u with PCP  11. Acute on chronic renal failure:              Cr.  1.36 on 10/7  Encourage fluids, educated patient again now that off IVF  IVF started on 10/5, DC'd on 10/8  Cont to monitor 12. Hypomagnesemia:   -supplement  Mg up to 1.7 on 9/28 13. Dyslipidemia: On Crestor. 14. Constipation  Cont meds as needed 15. E Coli UTI:  -sensitive to bactrim, completed course through 10/7 16. Thrombocytopenia  Plts 110 on 10/7  Cont to monitor 17.  Left CTS  Resting WHO ordered qhs, discussed with nursing and patient again  Improved with brace  LOS: 16 days A FACE TO Warren E Kirsteins 04/18/2019, 8:39 AM

## 2019-04-18 NOTE — Progress Notes (Signed)
Pt was D/C from IR unit with daughter. Pt left with all personal belongings and all questions answered. Doy Hutching, LPN

## 2019-04-19 DIAGNOSIS — D469 Myelodysplastic syndrome, unspecified: Secondary | ICD-10-CM | POA: Diagnosis not present

## 2019-04-19 DIAGNOSIS — D631 Anemia in chronic kidney disease: Secondary | ICD-10-CM | POA: Diagnosis not present

## 2019-04-19 DIAGNOSIS — I69322 Dysarthria following cerebral infarction: Secondary | ICD-10-CM | POA: Diagnosis not present

## 2019-04-19 DIAGNOSIS — N183 Chronic kidney disease, stage 3 unspecified: Secondary | ICD-10-CM | POA: Diagnosis not present

## 2019-04-19 DIAGNOSIS — E785 Hyperlipidemia, unspecified: Secondary | ICD-10-CM | POA: Diagnosis not present

## 2019-04-19 DIAGNOSIS — I69354 Hemiplegia and hemiparesis following cerebral infarction affecting left non-dominant side: Secondary | ICD-10-CM | POA: Diagnosis not present

## 2019-04-19 DIAGNOSIS — I129 Hypertensive chronic kidney disease with stage 1 through stage 4 chronic kidney disease, or unspecified chronic kidney disease: Secondary | ICD-10-CM | POA: Diagnosis not present

## 2019-04-19 DIAGNOSIS — E1142 Type 2 diabetes mellitus with diabetic polyneuropathy: Secondary | ICD-10-CM | POA: Diagnosis not present

## 2019-04-19 DIAGNOSIS — E1122 Type 2 diabetes mellitus with diabetic chronic kidney disease: Secondary | ICD-10-CM | POA: Diagnosis not present

## 2019-04-20 ENCOUNTER — Telehealth: Payer: Self-pay | Admitting: *Deleted

## 2019-04-20 ENCOUNTER — Telehealth: Payer: Self-pay | Admitting: Family Medicine

## 2019-04-20 NOTE — Telephone Encounter (Signed)
Left VM giving Kelly the verbal order 

## 2019-04-20 NOTE — Telephone Encounter (Signed)
Transition Care Management Follow-up Telephone Call   Date discharged? 04/18/19   How have you been since you were released from the hospital? Spoke w/daughter (Mrs. Rosann Auerbach) she states her mom is doing alright   Do you understand why you were in the hospital? YES   Do you understand the discharge instructions? YES   Where were you discharged to? HOME   Items Reviewed:  Medications reviewed: YES, per daughter no changes on meds  Allergies reviewed: YES  Dietary changes reviewed: YES. Heart healthy  Referrals reviewed: No referral recommended. Will be f/u with rehab MD on 04/28/19   Functional Questionnaire:   Activities of Daily Living (ADLs):   She states she are independent in the following: bathing and hygiene, feeding, continence, grooming, toileting and dressing States she require assistance with the following: ambulation   Any transportation issues/concerns?: NO   Any patient concerns? NO   Confirmed importance and date/time of follow-up visits scheduled YES, appt 04/29/19  Provider Appointment booked with Dr. Glori Bickers  Confirmed with patient if condition begins to worsen call PCP or go to the ER.  Patient was given the office number and encouraged to call back with question or concerns.  : YES

## 2019-04-20 NOTE — Telephone Encounter (Signed)
Kelly [email or web address]   Best number [phone]  Verbal order for PT  2 week 4 1 week 3

## 2019-04-20 NOTE — Discharge Summary (Signed)
Physician Discharge Summary  Patient ID: Stacey Harris MRN: [id number] DOB/AGE: August 13, 1928 85 y.o.  Admit date: 04/02/2019 Discharge date: 04/18/2019  Discharge Diagnoses:  Principal Problem:   Right thalamic stroke [name]   Stroke of right basal ganglia (HCC)   E-coli UTI   Myelodysplastic syndrome (HCC)   Labile blood glucose   Benign essential HTN   Carpal tunnel syndrome of left wrist   Labile blood pressure   Type 2 diabetes mellitus with hyperglycemia, without long-term current use of insulin (HCC)   Discharged Condition: Stable  Significant Diagnostic Studies: N/A   Labs:  Basic Metabolic Panel: BMP Latest Ref Rng & Units 04/15/2019 04/13/2019 04/09/2019  Glucose 70 - 99 mg/dL 120(H) 108(H) 112(H)  BUN 8 - 23 mg/dL _0 Creatinine 0.44 - 1.00 mg/dL 1.36(H) 1.43(H) 1.20(H)  Sodium 135 - 145 mmol/L 138 141 140  Potassium 3.5 - 5.1 mmol/L 4.1 4.5 4.0  Chloride 98 - 111 mmol/L 111 114(H) 112(H)  CO2 22 - 32 mmol/L 21(L) 21(L) 22  Calcium 8.9 - 10.3 mg/dL 8.5(L) 8.9 8.7(L)    CBC: Recent Labs  Lab 04/13/19 0730 04/15/19 0504 04/17/19 1151  WBC 4.3 4.3 4.8  HGB 8.7* 8.8* 9.2*  HCT 26.6* 26.3* 28.0*  MCV 102.7* 102.3* 103.7*  PLT 107* 110* 107*    CBG: Recent Labs  Lab 04/17/19 0630 04/17/19 1142 04/17/19 1625 04/17/19 2137 04/18/19 0634  GLUCAP 112* 123* 150* 140* 99    Brief HPI:   Stacey Harris is a 85 y.o. female with history of T2DM with peripheral neuropathy, HTN, CKD, myelodysplasia, macular degeneration who was admitted on 03/30/19 with slurred speech and left-sided weakness.  At baseline patient needed some assistance with walking and was unable to complete ADLs without assistance.  MRI/MRI brain/neck was limited but showed early subacute right basal ganglia infarct.  Dr. Erlinda Hong felt that stroke was due to small vessel disease and low-dose aspirin added for stroke prevention.  Hospital course complicated by thrombocytopenia and  neurology recommended d/c of aspirin if platelets were <  50.  Therapy evaluations completed and patient was noted to be limited by left-sided weakness with left inattention and cognitive deficits.  CIR was recommended due to functional decline   Hospital Course: Stacey Harris was admitted to rehab 04/02/2019 for inpatient therapies to consist of PT, ST and OT at least three hours five days a week. Past admission physiatrist, therapy team and rehab RN have worked together to provide customized collaborative inpatient rehab. Serial CBC showed platelets to be stable therefore she was maintained on low dose ASA. H/H did drop to 8.7 and anemia panel showed evidence of iron deficiency. She was started on iron supplement and stool guaiac X 1 was noted to be positive. Daughter has been advised to monitor for signs of bleeding or GI distress due to ASA on board.  Recommend follow up CBC and stool for occult blood after discharge. She was noted to have persistent hypomagnesemia and was started on supplement with improvement in Mg-1.7. Dysuria with strong urine reported and she was found to have E coli UTI. She was treated with bactrim X 7 days with resolution of symptoms.   Blood pressures were monitored on TID basis and has been stable off medications. Her po intake has been good and Amaryl was resumed past admission. Her diabetes has been monitored with ac/hs CBG checks. Metformin was discontinue due to CKD and acute on chronic renal failure has improved  with IVF briefly for hydration. Follow up labs showed improvement with BUN/SCr down to 22/1.36.  Left wrist pain felt to be due to carpel tunnel syndrome and has improved with use of resting hand splint. She has been instructed of compliance with this and is reporting decrease in pain. She has made gains during rehab stay and is currently at [facility] to supervision level due to cognitive and balance deficits.  She will continue to receive follow up HHPT and [place] [facility]  at [facility] after discharge.   Rehab course: During patient's stay in rehab weekly team conferences were held to monitor patient's progress, set goals and discuss barriers to discharge. At admission, patient required max assist with mobility and mod to max assist with basic ADL tasks. She demonstrated impairments in basic problem-solving and recall of daily information.  She also had mild oral motor weakness with decreased speech intelligibility and prolonged mastication.  No overt signs or symptoms of aspiration noted with thin liquids. She  has had improvement in activity tolerance, balance, postural control as well as ability to compensate for deficits. She has had improvement in functional use LUE  and LLE as well as improvement in awareness.  She is able to complete ADL tasks with close supervision and use of a E.  She requires supervision for transfers and is able to ambulate 50 feet with contact-guard assist and use of rolling walker.  She continues to have shuffling gait with poor posture and requires verbal cues for safety.  Speech is 100% intelligible at sentence level and she is showing improved ability to utilize external aids to help with recall as well as basic problem-solving.  It was felt that patient's cognition was at baseline and notes follow-up speech therapy needed after discharge family education was completed regarding all aspects of safety and care.   Disposition: Home  Diet: Carb Modified restrictions.   Special Instructions: 1. Recommend follow up CBC/BMET/Magnesium levels in a week.  2. Recommend stool guaiacs X 3 for follow up on heme positive stool.   3. Monitor BS bid for now and follow up with PCP for further adjustment in Amaryl.    Discharge Instructions    Ambulatory referral to Physical Medicine Rehab   Complete by: As directed    1-2 weeks transitional care appt     Allergies as of 04/18/2019      Reactions   Keflex [cephalexin]    Did ok with lower dose,  higher dose caused a rash   Mavik [trandolapril]    Cough       Medication List    TAKE these medications   Accu-Chek Aviva Plus w/Device Kit Check blood sugar once daily and as directed.Dx E11.43   Accu-Chek SmartView Control Liqd   acetaminophen 325 MG tablet Commonly known as: TYLENOL Take 1-2 tablets (325-650 mg total) by mouth every 4 (four) hours as needed for mild pain.   aspirin 81 MG EC tablet Take 1 tablet (81 mg total) by mouth daily.   CRANBERRY PO Take 2 tablets by mouth daily.   Fish Oil 1200 MG Caps Take 1,200 mg by mouth daily.   glimepiride 4 MG tablet Commonly known as: AMARYL TAKE 1 TABLET EVERY DAY BEFORE BREAKFAST What changed:   how much to take  how to take this  when to take this  additional instructions   glucose blood test strip Commonly known as: Accu-Chek Aviva Plus Check blood sugar once daily and as directed.Dx E11.43   iron  polysaccharides 150 MG capsule Commonly known as: NIFEREX Take 1 capsule (150 mg total) by mouth 2 (two) times daily before lunch and supper.   levothyroxine 50 MCG tablet Commonly known as: SYNTHROID Take 1 tablet (50 mcg total) by mouth daily.   magnesium gluconate 500 MG tablet Commonly known as: MAGONATE Take 1 tablet (500 mg total) by mouth daily.   multivitamin capsule Take 1 capsule by mouth daily.   rosuvastatin 10 MG tablet Commonly known as: Crestor Take 1 tablet (10 mg total) by mouth daily.   senna-docusate 8.6-50 MG tablet Commonly known as: Senokot-S Take 1 tablet by mouth at bedtime as needed for moderate constipation.   VITAMIN D PO Take 5,000 Units by mouth daily.      Follow-up Information    Tower, Wynelle [name], MD. Call on 04/20/2019.   Specialties: Family Medicine, Radiology Why: for post hospital follow up.  Contact information: [place] [address] [phone]        Charlett Blake, MD Follow up.   Specialty: Physical Medicine and  Rehabilitation Why: Office will call you with follow up appointment Contact information: [place] [address] [phone]        [facility]. Call on 04/23/2019.   Specialty: Neurology Why: By the end of the week if you have not heard from them--stroke follow up.  Contact information: [address] [place] [phone]          Signed: Bary Leriche 04/20/2019, 12:00 AM

## 2019-04-20 NOTE — Telephone Encounter (Signed)
Please verbally ok those orders 

## 2019-04-21 ENCOUNTER — Telehealth: Payer: Self-pay

## 2019-04-21 NOTE — Telephone Encounter (Signed)
1st attempt at a [place] completed, no answer, left voicemail to return call  Patient Name:  Stacey Harris, Stacey Harris DOB: [birth year] Appointment Date and Time:04-28-2019 / 11:20am  With:  Dr. Dagoberto Ligas

## 2019-04-21 NOTE — Telephone Encounter (Signed)
Questions for our staff to ask patients on Transitional care 48 hour phone call:   1. Are you/is patient experiencing any problems since coming home? No     Are there any questions regarding any aspect of care? NA  2. Are there any questions regarding medications administration/dosing? NO     Are meds being taken as prescribed? no  3. Have there been any falls? NO  4. Has Home Health been to the house and/or have they contacted you? YES     If not, have you tried to contact them? NA     Can we help you contact them? NA  5. Are bowels and bladder emptying properly? YES     Are there any unexpected incontinence issues? NA     If applicable, is patient following bowel/bladder programs?NA  6. Any fevers, problems with breathing, unexpected pain? NO  7. Are there any skin problems or new areas of breakdown? NO  8. Has the patient/family member arranged specialty MD follow up (ie cardiology/neurology/renal/surgical/etc)? YES     Can we help arrange? NA 9. Does the patient need any other services or support that we can help arrange? NA  10. Are caregivers following through as expected in assisting the patient? YES  11. Has the patient quit smoking, drinking alcohol, or using drugs as recommended? NA  [facility] [address] [phone]

## 2019-04-22 ENCOUNTER — Other Ambulatory Visit: Payer: Self-pay

## 2019-04-22 ENCOUNTER — Encounter: Payer: Self-pay | Admitting: Diagnostic Neuroimaging

## 2019-04-22 ENCOUNTER — Ambulatory Visit: Payer: Medicare HMO | Admitting: Diagnostic Neuroimaging

## 2019-04-22 VITALS — BP 152/80 | HR 50 | Temp 97.4°F | Ht 62.0 in | Wt 165.0 lb

## 2019-04-22 DIAGNOSIS — I639 Cerebral infarction, unspecified: Secondary | ICD-10-CM | POA: Diagnosis not present

## 2019-04-22 NOTE — Progress Notes (Signed)
[facility]  PATIENT: Stacey Harris DOB: 04/17/1929  REFERRING CLINICIAN: hospital / Erlinda Hong HISTORY FROM: patient  REASON FOR VISIT: new consult    HISTORICAL  CHIEF COMPLAINT:  Chief Complaint  Patient presents with  . Cerebrovascular Accident    rm 6, hospital FU, dgtr- [name], "PT ongoing"    HISTORY OF PRESENT ILLNESS:   85 year old female with hypertension, diabetes, hypercholesterolemia here for evaluation of hospital stroke follow-up.  Patient presented to hospital 03/30/2019 acute left-sided weakness when she woke up, diagnosed with right basal ganglia ischemic infarct.  This was likely due to small vessel thrombosis.  IV TPA was not given as this was a wake-up stroke.  Stroke work-up was completed.  Patient was transition to inpatient rehabilitation, completed therapy sessions, now back home.  Patient continues to have left-sided weakness in the left arm and leg.   REVIEW OF SYSTEMS: Full 14 system review of systems performed and negative with exception of: As per HPI.  ALLERGIES: Allergies  Allergen Reactions  . Keflex [Cephalexin]     Did ok with lower dose, higher dose caused a rash    . Mavik [Trandolapril]     Cough     HOME MEDICATIONS: Outpatient Medications Prior to Visit  Medication Sig Dispense Refill  . acetaminophen (TYLENOL) 325 MG tablet Take 1-2 tablets (325-650 mg total) by mouth every 4 (four) hours as needed for mild pain.    [name] aspirin EC 81 MG EC tablet Take 1 tablet (81 mg total) by mouth daily.    . Blood Glucose Calibration (ACCU-CHEK SMARTVIEW CONTROL) LIQD     . Blood Glucose Monitoring Suppl (ACCU-CHEK AVIVA PLUS) w/Device KIT Check blood sugar once daily and as directed.Dx E11.43 1 kit 0  . Cholecalciferol (VITAMIN D PO) Take 5,000 Units by mouth daily.    [name] CRANBERRY PO Take 2 tablets by mouth daily.     [name] glimepiride (AMARYL) 4 MG tablet TAKE 1 TABLET EVERY DAY BEFORE BREAKFAST (Patient taking differently: Take  4 mg by mouth daily with breakfast. ) 90 tablet 3  . glucose blood (ACCU-CHEK AVIVA PLUS) test strip Check blood sugar once daily and as directed.Dx E11.43 100 each 3  . iron polysaccharides (NIFEREX) 150 MG capsule Take 1 capsule (150 mg total) by mouth 2 (two) times daily before lunch and supper. 60 capsule 0  . levothyroxine (SYNTHROID, LEVOTHROID) 50 MCG tablet Take 1 tablet (50 mcg total) by mouth daily. 90 tablet 3  . Multiple Vitamin (MULTIVITAMIN) capsule Take 1 capsule by mouth daily.    . Omega-3 Fatty Acids (FISH OIL) 1200 MG CAPS Take 1,200 mg by mouth daily.     . rosuvastatin (CRESTOR) 10 MG tablet Take 1 tablet (10 mg total) by mouth daily. 90 tablet 3  . senna-docusate (SENOKOT-S) 8.6-50 MG tablet Take 1 tablet by mouth at bedtime as needed for moderate constipation.    . magnesium gluconate (MAGONATE) 500 MG tablet Take 1 tablet (500 mg total) by mouth daily. (Patient not taking: Reported on 04/22/2019) 30 tablet 1   No facility-administered medications prior to visit.     PAST MEDICAL HISTORY: Past Medical History:  Diagnosis Date  . Blood transfusion   . Cancer ([place])    basal cell skin CA  . Diabetes mellitus    type II  . Frequent UTI   . Hyperlipidemia   . Hypertension   . Hypothyroidism   . Macular degeneration of both eyes   . OA (osteoarthritis)   .  Stroke [facility]) 03/30/2019    PAST SURGICAL HISTORY: Past Surgical History:  Procedure Laterality Date  . ANKLE FRACTURE SURGERY  10   rt  . CARPAL TUNNEL RELEASE Right 06/06/2017   Procedure: RIGHT CARPAL TUNNEL RELEASE;  Surgeon: Daryll Brod, MD;  Location: [place];  Service: Orthopedics;  Laterality: Right;  . CATARACT EXTRACTION W/ INTRAOCULAR LENS  IMPLANT, BILATERAL Bilateral 2019  . JOINT REPLACEMENT Right 2010   rt total hip 10  . LUMBAR LAMINECTOMY/DECOMPRESSION MICRODISCECTOMY  06/22/2011   Procedure: LUMBAR LAMINECTOMY/DECOMPRESSION MICRODISCECTOMY;  Surgeon: Marybelle Killings;   Location: [place];  Service: Orthopedics;  Laterality: N/A;  L3-4, L4-5 Decompression  . MOHS SURGERY  12/08   basal cell skin cancer lt temple  . TAH      and BSO, age 83  . TONSILLECTOMY      FAMILY HISTORY: Family History  Problem Relation Age of Onset  . Arthritis Father        RA  . Hypertension Father   . Heart disease Father        CAD  . Diabetes Father   . Arthritis Sister        RA  . Diabetes Brother   . Cancer Brother   . Diabetes Brother   . Cancer Brother   . Diabetes Brother   . Cancer Brother   . Diabetes Sister   . Cancer Brother     SOCIAL HISTORY: Social History   Socioeconomic History  . Marital status: Widowed    Spouse name: Not on file  . Number of children: 4  . Years of education: 46  . Highest education level: Not on file  Occupational History  . Not on file  Social Needs  . Financial resource strain: Not on file  . Food insecurity    Worry: Not on file    Inability: Not on file  . Transportation needs    Medical: Not on file    Non-medical: Not on file  Tobacco Use  . Smoking status: Never Smoker  . Smokeless tobacco: Never Used  Substance and Sexual Activity  . Alcohol use: No    Alcohol/week: 0.0 standard drinks  . Drug use: No  . Sexual activity: Never    Birth control/protection: Post-menopausal  Lifestyle  . Physical activity    Days per week: Not on file    Minutes per session: Not on file  . Stress: Not on file  Relationships  . Social Herbalist on phone: Not on file    Gets together: Not on file    Attends religious service: Not on file    Active member of club or organization: Not on file    Attends meetings of clubs or organizations: Not on file    Relationship status: Not on file  . Intimate partner violence    Fear of current or ex partner: Not on file    Emotionally abused: Not on file    Physically abused: Not on file    Forced sexual activity: Not on file  Other Topics Concern  . Not on file   Social History Narrative   04/22/19 Lives with daughter, [name]   No caffeine     PHYSICAL EXAM  GENERAL EXAM/CONSTITUTIONAL: Vitals:  Vitals:   04/22/19 1018  BP: (!) 152/80  Pulse: (!) 50  Temp: (!) 97.4 F (36.3 C)  Weight: 165 lb (74.8 kg)  Height: 5' 2"  (1.575 m)  Body mass index is 30.18 kg/m. Wt Readings from Last 3 Encounters:  04/22/19 165 lb (74.8 kg)  04/17/19 167 lb 8.8 oz (76 kg)  03/30/19 171 lb 1.2 oz (77.6 kg)     Patient is in no distress; well developed, nourished and groomed; neck is supple  CARDIOVASCULAR:  Examination of carotid arteries is normal; no carotid bruits  Regular rate and rhythm, no murmurs  Examination of peripheral vascular system by observation and palpation is normal  EYES:  Ophthalmoscopic exam of optic discs and posterior segments is normal; no papilledema or hemorrhages  No exam data present  MUSCULOSKELETAL:  Gait, strength, tone, movements noted in Neurologic exam below  NEUROLOGIC: MENTAL STATUS:  MMSE - [place] Exam 04/08/2018 02/04/2017 12/30/2015  Orientation to time 5 4 5   Orientation to Place 5 4 5   Registration 3 3 3   Attention/ Calculation 0 0 0  Recall 3 2 3   Language- name 2 objects 0 0 0  Language- repeat 1 1 1   Language- follow 3 step command 3 3 3   Language- read & follow direction 0 0 0  Write a sentence 0 0 0  Copy design 0 0 0  Total score 20 17 20     awake, alert, oriented to person, place and time  recent and remote memory intact  normal attention and concentration  language fluent, comprehension intact, naming intact  fund of knowledge appropriate  CRANIAL NERVE:   2nd - no papilledema on fundoscopic exam  2nd, 3rd, 4th, 6th - pupils equal and reactive to light, visual fields full to confrontation, extraocular muscles intact, no nystagmus  5th - facial sensation symmetric  7th - facial strength symmetric  8th - hearing intact  9th - palate elevates  symmetrically, uvula midline  11th - shoulder shrug symmetric  12th - tongue protrusion midline  MOTOR:   normal bulk and tone; ATROPHY OF BILATERAL APB  RUE 5; RLE 5-  LUE 3-4  LLE 4  SENSORY:   normal and symmetric to light touch; DECR IN BILATERAL HANDS AND FEET TO PP  COORDINATION:   finger-nose-finger, fine finger movements SLOW ON LEFT  REFLEXES:   deep tendon reflexes TRACE and symmetric  GAIT/STATION:   SLOW, CAUTIOUS, using walker     DIAGNOSTIC DATA (LABS, IMAGING, TESTING) - I reviewed patient records, labs, notes, testing and imaging myself where available.  Lab Results  Component Value Date   WBC 4.8 04/17/2019   HGB 9.2 (L) 04/17/2019   HCT 28.0 (L) 04/17/2019   MCV 103.7 (H) 04/17/2019   PLT 107 (L) 04/17/2019      Component Value Date/Time   NA 138 04/15/2019 0504   NA 143 09/03/2016 0902   K 4.1 04/15/2019 0504   K 4.4 09/03/2016 0902   CL 111 04/15/2019 0504   CO2 21 (L) 04/15/2019 0504   CO2 23 09/03/2016 0902   GLUCOSE 120 (H) 04/15/2019 0504   GLUCOSE 109 09/03/2016 0902   BUN 22 04/15/2019 0504   BUN 28.8 (H) 09/03/2016 0902   CREATININE 1.36 (H) 04/15/2019 0504   CREATININE 1.1 09/03/2016 0902   CALCIUM 8.5 (L) 04/15/2019 0504   CALCIUM 10.0 09/03/2016 0902   PROT 5.7 (L) 04/03/2019 0556   PROT 6.9 09/03/2016 0902   ALBUMIN 2.3 (L) 04/03/2019 0556   ALBUMIN 3.3 (L) 09/03/2016 0902   AST 46 (H) 04/03/2019 0556   AST 37 (H) 09/03/2016 0902   ALT 27 04/03/2019 0556   ALT 23 09/03/2016  0902   ALKPHOS 56 04/03/2019 0556   ALKPHOS 79 09/03/2016 0902   BILITOT 1.1 04/03/2019 0556   BILITOT 0.75 09/03/2016 0902   GFRNONAA 35 (L) 04/15/2019 0504   GFRAA 40 (L) 04/15/2019 0504   Lab Results  Component Value Date   CHOL 92 03/31/2019   HDL 20 (L) 03/31/2019   LDLCALC 55 03/31/2019   LDLDIRECT 202.7 08/17/2010   TRIG 84 03/31/2019   CHOLHDL 4.6 03/31/2019   Lab Results  Component Value Date   HGBA1C 8.5 (H) 03/31/2019    Lab Results  Component Value Date   EZBMZTAE82 574 04/13/2019   Lab Results  Component Value Date   TSH 1.543 03/30/2019    03/30/19 MRI HEAD IMPRESSION: 1. Technically limited exam due to extensive motion artifact and patient's inability to tolerate the full length of the study. 2. Approximate 2.8 cm acute to early subacute ischemic infarct involving the right basal ganglia as above. No associated hemorrhage or mass effect. 3. Underlying age-related cerebral atrophy with mild chronic small vessel ischemic disease.  03/30/19 MRA HEAD IMPRESSION: 1. Severely limited exam due to extensive motion artifact. 2. Gross patency of the major arterial intracranial vasculature. No obvious vascular occlusion or focal high-grade stenosis.  03/30/19 MRA NECK IMPRESSION: 1. Severely limited exam due to extensive motion artifact. 2. Gross patency of the major arterial vasculature within the neck. No vascular occlusion or obvious flow-limiting stenosis.  03/30/19 TTE  1. Left ventricular ejection fraction, by visual estimation, is 60 to 65%. The left ventricle has normal function. Normal left ventricular size. There is no left ventricular hypertrophy.  2. Global right ventricle has normal systolic function.The right ventricular size is normal. No increase in right ventricular wall thickness.  3. Left atrial size was mildly dilated.  4. Right atrial size was normal.  5. Moderate mitral annular calcification.  6. The mitral valve is normal in structure. Mild to moderate mitral valve regurgitation. No evidence of mitral stenosis.  7. The tricuspid valve is normal in structure. Tricuspid valve regurgitation is mild.  8. The aortic valve is normal in structure. Aortic valve regurgitation is trivial by color flow Doppler. Structurally normal aortic valve, with no evidence of sclerosis or stenosis.  9. The pulmonic valve was normal in structure. Pulmonic valve regurgitation is trivial by color flow  Doppler. 10. Normal pulmonary artery systolic pressure. 11. The inferior vena cava is normal in size with greater than 50% respiratory variability, suggesting right atrial pressure of 3 mmHg. 12. No embolic source identified.  Sept 2020 HOSPITAL SUMMARY  MRI acute to early subacute R BG infarct.  Small vessel disease.  Atrophy.  MRA head gross patency  MRA neck gross patency  2D Echo EF 60 to 65%.  LA mildly dilated.  LDL 55  HgbA1c 8.5    ASSESSMENT AND PLAN  85 y.o. year old female here with:  Dx:  1. Infarction of right basal ganglia (HCC)     PLAN:  RIGHT BASAL GANGLIA STROKE (small vessel thrombosis; 03/30/19) - continue aspirin 45m daily, rosuvastatin, diabetes control, BP control (has restarted metoprolol per daughter) - continue home PT / OT  Return for return to PCP.    VPenni Bombard MD [id number] 171:[id number] Certified in Neurology, Neurophysiology and Neuroimaging  [facility] [address] [place] [phone]

## 2019-04-24 DIAGNOSIS — E1142 Type 2 diabetes mellitus with diabetic polyneuropathy: Secondary | ICD-10-CM | POA: Diagnosis not present

## 2019-04-24 DIAGNOSIS — I69354 Hemiplegia and hemiparesis following cerebral infarction affecting left non-dominant side: Secondary | ICD-10-CM | POA: Diagnosis not present

## 2019-04-24 DIAGNOSIS — I129 Hypertensive chronic kidney disease with stage 1 through stage 4 chronic kidney disease, or unspecified chronic kidney disease: Secondary | ICD-10-CM | POA: Diagnosis not present

## 2019-04-24 DIAGNOSIS — E785 Hyperlipidemia, unspecified: Secondary | ICD-10-CM | POA: Diagnosis not present

## 2019-04-24 DIAGNOSIS — N183 Chronic kidney disease, stage 3 unspecified: Secondary | ICD-10-CM | POA: Diagnosis not present

## 2019-04-24 DIAGNOSIS — E1122 Type 2 diabetes mellitus with diabetic chronic kidney disease: Secondary | ICD-10-CM | POA: Diagnosis not present

## 2019-04-24 DIAGNOSIS — I69322 Dysarthria following cerebral infarction: Secondary | ICD-10-CM | POA: Diagnosis not present

## 2019-04-24 DIAGNOSIS — D469 Myelodysplastic syndrome, unspecified: Secondary | ICD-10-CM | POA: Diagnosis not present

## 2019-04-24 DIAGNOSIS — D631 Anemia in chronic kidney disease: Secondary | ICD-10-CM | POA: Diagnosis not present

## 2019-04-28 ENCOUNTER — Encounter: Payer: Medicare HMO | Attending: Registered Nurse | Admitting: Registered Nurse

## 2019-04-28 ENCOUNTER — Other Ambulatory Visit: Payer: Self-pay

## 2019-04-28 ENCOUNTER — Encounter: Payer: Medicare HMO | Admitting: Physical Medicine and Rehabilitation

## 2019-04-28 ENCOUNTER — Encounter: Payer: Self-pay | Admitting: Registered Nurse

## 2019-04-28 VITALS — BP 130/80 | HR 60 | Temp 97.7°F | Ht 62.0 in | Wt 165.0 lb

## 2019-04-28 DIAGNOSIS — E1165 Type 2 diabetes mellitus with hyperglycemia: Secondary | ICD-10-CM | POA: Diagnosis not present

## 2019-04-28 DIAGNOSIS — D469 Myelodysplastic syndrome, unspecified: Secondary | ICD-10-CM | POA: Diagnosis not present

## 2019-04-28 DIAGNOSIS — I10 Essential (primary) hypertension: Secondary | ICD-10-CM

## 2019-04-28 DIAGNOSIS — I129 Hypertensive chronic kidney disease with stage 1 through stage 4 chronic kidney disease, or unspecified chronic kidney disease: Secondary | ICD-10-CM | POA: Diagnosis not present

## 2019-04-28 DIAGNOSIS — D631 Anemia in chronic kidney disease: Secondary | ICD-10-CM | POA: Diagnosis not present

## 2019-04-28 DIAGNOSIS — I69322 Dysarthria following cerebral infarction: Secondary | ICD-10-CM | POA: Diagnosis not present

## 2019-04-28 DIAGNOSIS — I6381 Other cerebral infarction due to occlusion or stenosis of small artery: Secondary | ICD-10-CM

## 2019-04-28 DIAGNOSIS — I639 Cerebral infarction, unspecified: Secondary | ICD-10-CM | POA: Insufficient documentation

## 2019-04-28 DIAGNOSIS — E1142 Type 2 diabetes mellitus with diabetic polyneuropathy: Secondary | ICD-10-CM | POA: Diagnosis not present

## 2019-04-28 DIAGNOSIS — I69354 Hemiplegia and hemiparesis following cerebral infarction affecting left non-dominant side: Secondary | ICD-10-CM | POA: Diagnosis not present

## 2019-04-28 DIAGNOSIS — E785 Hyperlipidemia, unspecified: Secondary | ICD-10-CM | POA: Diagnosis not present

## 2019-04-28 DIAGNOSIS — E1122 Type 2 diabetes mellitus with diabetic chronic kidney disease: Secondary | ICD-10-CM | POA: Diagnosis not present

## 2019-04-28 DIAGNOSIS — N183 Chronic kidney disease, stage 3 unspecified: Secondary | ICD-10-CM | POA: Diagnosis not present

## 2019-04-28 NOTE — Progress Notes (Signed)
Subjective:    Patient ID: Stacey Harris, female    DOB: Dec 11, 1928, 85 y.o.   MRN: [id number]  HPI: Stacey Harris is a 85 y.o. female who is here for transitional care visit for follow up of her right thalamic stroke, myelodysplastic syndrome, essential hypertension and Type 2 DM with hyperglycemia without long-term current use of insulin. Stacey Harris was brought to the  Emergency room via EMS with complaints of left-sided weakness and slurred speech. Stacey Harris reported Stacey Harris had a fall on Friday, she didn't seek medical attention. Neurology Consulted.   CT Head Code Stroke WO Contrast:  IMPRESSION: 1. Age indeterminate infarct involving the right basal ganglia, internal capsule, and corona radiata. This may be acute. 2. Moderate advanced generalized atrophy and white matter disease likely reflects the sequela of chronic microvascular ischemia. 3. Atherosclerosis  MR Brain WO Contrast: MR Angio Neck/ MR Head IMPRESSION: MRI HEAD IMPRESSION:  1. Technically limited exam due to extensive motion artifact and patient's inability to tolerate the full length of the study. 2. Approximate 2.8 cm acute to early subacute ischemic infarct involving the right basal ganglia as above. No associated hemorrhage or mass effect. 3. Underlying age-related cerebral atrophy with mild chronic small vessel ischemic disease.  MRA HEAD IMPRESSION:  1. Severely limited exam due to extensive motion artifact. 2. Gross patency of the major arterial intracranial vasculature. No obvious vascular occlusion or focal high-grade stenosis.  MRA NECK IMPRESSION:  1. Severely limited exam due to extensive motion artifact. 2. Gross patency of the major arterial vasculature within the neck. No vascular occlusion or obvious flow-limiting stenosis.  Stacey Harris was admitted to Inpatient Rehabilitation on 04/02/2019 and discharged home on 04/18/2019. She is receiving outpatient therapy from [facility]. She states she has pain in her left heel. She rated her pain 0. Also reports she has a good appetite  Pain Inventory Average Pain 0 Pain Right Now 0 My pain is intermittent and sharp  In the last 24 hours, has pain interfered with the following? General activity 0 Relation with others 0 Enjoyment of life 0 What TIME of day is your pain at its worst? night Sleep (in general) Fair  Pain is worse with: inactivity and some activites Pain improves with: medication Relief from Meds: 2  Mobility walk with assistance use a walker do you drive?  no Do you have any goals in this area?  yes  Function retired I need assistance with the following:  feeding, dressing, bathing, toileting, meal prep, household duties and shopping Do you have any goals in this area?  yes  Neuro/Psych bladder control problems bowel control problems weakness numbness tremor trouble walking spasms dizziness confusion  Prior Studies transitional care  Physicians involved in your care transitional care   Family History  Problem Relation Age of Onset  . Arthritis Father        RA  . Hypertension Father   . Heart disease Father        CAD  . Diabetes Father   . Arthritis Sister        RA  . Diabetes Brother   . Cancer Brother   . Diabetes Brother   . Cancer Brother   . Diabetes Brother   . Cancer Brother   . Diabetes Sister   . Cancer Brother    Social History   Socioeconomic History  . Marital status: Widowed    Spouse name: Not on file  . Number of  children: 4  . Years of education: 54  . Highest education level: Not on file  Occupational History  . Not on file  Social Needs  . Financial resource strain: Not on file  . Food insecurity    Worry: Not on file    Inability: Not on file  . Transportation needs    Medical: Not on file    Non-medical: Not on file  Tobacco Use  . Smoking status: Never Smoker  . Smokeless tobacco: Never Used  Substance and Sexual Activity   . Alcohol use: No    Alcohol/week: 0.0 standard drinks  . Drug use: No  . Sexual activity: Never    Birth control/protection: Post-menopausal  Lifestyle  . Physical activity    Days per week: Not on file    Minutes per session: Not on file  . Stress: Not on file  Relationships  . Social Herbalist on phone: Not on file    Gets together: Not on file    Attends religious service: Not on file    Active member of club or organization: Not on file    Attends meetings of clubs or organizations: Not on file    Relationship status: Not on file  Other Topics Concern  . Not on file  Social History Narrative   04/22/19 Lives with daughter, [name]   No caffeine   Past Surgical History:  Procedure Laterality Date  . ANKLE FRACTURE SURGERY  10   rt  . CARPAL TUNNEL RELEASE Right 06/06/2017   Procedure: RIGHT CARPAL TUNNEL RELEASE;  Surgeon: Daryll Brod, MD;  Location: [place];  Service: Orthopedics;  Laterality: Right;  . CATARACT EXTRACTION W/ INTRAOCULAR LENS  IMPLANT, BILATERAL Bilateral 2019  . JOINT REPLACEMENT Right 2010   rt total hip 10  . LUMBAR LAMINECTOMY/DECOMPRESSION MICRODISCECTOMY  06/22/2011   Procedure: LUMBAR LAMINECTOMY/DECOMPRESSION MICRODISCECTOMY;  Surgeon: Marybelle Killings;  Location: [place];  Service: Orthopedics;  Laterality: N/A;  L3-4, L4-5 Decompression  . MOHS SURGERY  12/08   basal cell skin cancer lt temple  . TAH      and BSO, age 34  . TONSILLECTOMY     Past Medical History:  Diagnosis Date  . Blood transfusion   . Cancer ([place])    basal cell skin CA  . Diabetes mellitus    type II  . Frequent UTI   . Hyperlipidemia   . Hypertension   . Hypothyroidism   . Macular degeneration of both eyes   . OA (osteoarthritis)   . Stroke ([place]) 03/30/2019   BP 130/80   Pulse 60   Temp 97.7 F (36.5 C)   Ht 5\' 2"  (1.575 m)   Wt 165 lb (74.8 kg)   SpO2 95%   BMI 30.18 kg/m   Opioid Risk Score:   Fall Risk Score:  `1   Depression screen PHQ 2/9  Depression screen [name] 2/9 04/08/2018 02/04/2017 12/30/2015 08/06/2014 08/03/2013 04/29/2012  Decreased Interest 0 0 0 0 0 0  Down, Depressed, Hopeless 1 1 0 0 0 0  PHQ - 2 Score 1 1 0 0 0 0  Altered sleeping 0 3 - - - -  Tired, decreased energy 0 3 - - - -  Change in appetite 0 0 - - - -  Feeling bad or failure about yourself  0 1 - - - -  Trouble concentrating 0 0 - - - -  Moving slowly or fidgety/restless 0 0 - - - -  Suicidal thoughts - 0 - - - -  PHQ-9 Score 1 8 - - - -  Difficult doing work/chores - Not difficult at all - - - -    Review of Systems  Constitutional: Negative.   HENT: Negative.   Eyes: Negative.   Respiratory: Positive for shortness of breath.   Cardiovascular: Positive for leg swelling.  Gastrointestinal:       Bowel control problems   Endocrine: Negative.   Genitourinary: Positive for difficulty urinating.  Musculoskeletal: Positive for gait problem.       Spasms   Allergic/Immunologic: Negative.   Neurological: Positive for dizziness, tremors, weakness and numbness.  Hematological: Negative.   Psychiatric/Behavioral: Negative.   All other systems reviewed and are negative.      Objective:   Physical Exam Vitals signs and nursing note reviewed.  Constitutional:      Appearance: Normal appearance.  Neck:     Musculoskeletal: Normal range of motion and neck supple.  Cardiovascular:     Rate and Rhythm: Normal rate and regular rhythm.     Pulses: Normal pulses.     Heart sounds: Normal heart sounds.  Pulmonary:     Effort: Pulmonary effort is normal.     Breath sounds: Normal breath sounds.  Musculoskeletal:     Right lower leg: Edema present.     Left lower leg: Edema present.     Comments: Normal Muscle Bulk and Muscle Testing Reveals:  Upper Extremities:Right: Full  ROM and Muscle Strength 5/5 Left: Decreased ROM 90 Degrees and Muscle Strength 4/5 Lower Extremities: Full ROM and Muscle Strength 5/5 Wearing  Compression Stocking Arises from chair slowly Narrow Based  Gait   Skin:    General: Skin is warm and dry.  Neurological:     Mental Status: She is alert and oriented to person, place, and time.  Psychiatric:        Mood and Affect: Mood normal.        Behavior: Behavior normal.           Assessment & Plan:  1. Right thalamic stroke: Outpatient Therapy [facility]. Continue to Monitor.  2. Myelodysplastic syndrome: PCP Following. Continue to Monitor.  3. Essential hypertension: Continue to Monitor: PCP Following.  4.Type 2 DM with hyperglycemia without long-term current use of insulin. Continue current medication regimen. PCP Following.   20 minutes of face to face patient care time was spent during this visit. All questions were encouraged and answered.  F/U with Dr Dagoberto Ligas in 4-6 weeks

## 2019-04-29 ENCOUNTER — Ambulatory Visit (INDEPENDENT_AMBULATORY_CARE_PROVIDER_SITE_OTHER): Payer: Medicare HMO | Admitting: Family Medicine

## 2019-04-29 ENCOUNTER — Encounter: Payer: Self-pay | Admitting: Family Medicine

## 2019-04-29 VITALS — BP 126/68 | HR 67 | Temp 98.0°F | Ht 62.0 in | Wt 164.5 lb

## 2019-04-29 DIAGNOSIS — D469 Myelodysplastic syndrome, unspecified: Secondary | ICD-10-CM | POA: Diagnosis not present

## 2019-04-29 DIAGNOSIS — Z8673 Personal history of transient ischemic attack (TIA), and cerebral infarction without residual deficits: Secondary | ICD-10-CM

## 2019-04-29 DIAGNOSIS — D696 Thrombocytopenia, unspecified: Secondary | ICD-10-CM

## 2019-04-29 DIAGNOSIS — N179 Acute kidney failure, unspecified: Secondary | ICD-10-CM

## 2019-04-29 DIAGNOSIS — N390 Urinary tract infection, site not specified: Secondary | ICD-10-CM

## 2019-04-29 DIAGNOSIS — B9620 Unspecified Escherichia coli [E. coli] as the cause of diseases classified elsewhere: Secondary | ICD-10-CM

## 2019-04-29 DIAGNOSIS — E1143 Type 2 diabetes mellitus with diabetic autonomic (poly)neuropathy: Secondary | ICD-10-CM | POA: Diagnosis not present

## 2019-04-29 DIAGNOSIS — E7849 Other hyperlipidemia: Secondary | ICD-10-CM | POA: Diagnosis not present

## 2019-04-29 DIAGNOSIS — E611 Iron deficiency: Secondary | ICD-10-CM | POA: Diagnosis not present

## 2019-04-29 DIAGNOSIS — E038 Other specified hypothyroidism: Secondary | ICD-10-CM

## 2019-04-29 DIAGNOSIS — I10 Essential (primary) hypertension: Secondary | ICD-10-CM | POA: Diagnosis not present

## 2019-04-29 LAB — CBC WITH DIFFERENTIAL/PLATELET
Basophils Absolute: 0.1 10*3/uL (ref 0.0–0.1)
Basophils Relative: 1 % (ref 0.0–3.0)
Eosinophils Absolute: 0.3 10*3/uL (ref 0.0–0.7)
Eosinophils Relative: 4.9 % (ref 0.0–5.0)
HCT: 31.6 % — ABNORMAL LOW (ref 36.0–46.0)
Hemoglobin: 10.5 g/dL — ABNORMAL LOW (ref 12.0–15.0)
Lymphocytes Relative: 32.3 % (ref 12.0–46.0)
Lymphs Abs: 1.9 10*3/uL (ref 0.7–4.0)
MCHC: 33.2 g/dL (ref 30.0–36.0)
MCV: 102 fl — ABNORMAL HIGH (ref 78.0–100.0)
Monocytes Absolute: 0.5 10*3/uL (ref 0.1–1.0)
Monocytes Relative: 9 % (ref 3.0–12.0)
Neutro Abs: 3.1 10*3/uL (ref 1.4–7.7)
Neutrophils Relative %: 52.8 % (ref 43.0–77.0)
Platelets: 98 10*3/uL — ABNORMAL LOW (ref 150.0–400.0)
RBC: 3.1 Mil/uL — ABNORMAL LOW (ref 3.87–5.11)
RDW: 16.1 % — ABNORMAL HIGH (ref 11.5–15.5)
WBC: 5.9 10*3/uL (ref 4.0–10.5)

## 2019-04-29 LAB — BASIC METABOLIC PANEL
BUN: 25 mg/dL — ABNORMAL HIGH (ref 6–23)
CO2: 25 mEq/L (ref 19–32)
Calcium: 9.4 mg/dL (ref 8.4–10.5)
Chloride: 106 mEq/L (ref 96–112)
Creatinine, Ser: 1.21 mg/dL — ABNORMAL HIGH (ref 0.40–1.20)
GFR: 42 mL/min — ABNORMAL LOW (ref >60.00)
Glucose, Bld: 179 mg/dL — ABNORMAL HIGH (ref 70–99)
Potassium: 3.7 mEq/L (ref 3.5–5.1)
Sodium: 140 mEq/L (ref 135–145)

## 2019-04-29 LAB — FERRITIN: Ferritin: 124.3 ng/mL (ref 10.0–291.0)

## 2019-04-29 LAB — MAGNESIUM: Magnesium: 1.5 mg/dL (ref 1.5–2.5)

## 2019-04-29 MED ORDER — ROSUVASTATIN CALCIUM 10 MG PO TABS: 10.0000 mg | ORAL_TABLET | Freq: Every day | ORAL | 3 refills | Status: DC

## 2019-04-29 NOTE — Assessment & Plan Note (Signed)
Taking mag gluconate and tolerating well Mag level today

## 2019-04-29 NOTE — Assessment & Plan Note (Signed)
Treated with bactrim in hosp Reviewed hospital records, lab results and studies in detail   Symptoms resolved

## 2019-04-29 NOTE — Assessment & Plan Note (Signed)
Acute on chronic - recent hosp for cva Off metformin and hctz now  Bmp today and advise  faimly is pushing fluids

## 2019-04-29 NOTE — Assessment & Plan Note (Signed)
In context of MDS Will have to hold asa for pl ct under 50 Cbc today

## 2019-04-29 NOTE — Progress Notes (Signed)
Subjective:    Patient ID: Stacey Harris, female    DOB: [birth year], 85 y.o.   MRN: [id number]  HPI Here for f/u of hospitalization from 03/30/19 to 04/18/19  Principal dx is R thalamic stroke  She presented with slurred speech and L sided weakness   MRI showed subacute R basal ganglia infarct  Thought to bue due to small vessl dz  Low dose asa was started   Then platelets dropped (pt has myelodysplastic syndrome) and it was ordered to hold asa if platelets are under 50     She was admitted then to rehab for PT/ ST and OT   Hb did drop and iron def was dx and she was started on niferex  Stool was heme pos so this was watched carefully  She remained stable Also tx for low magnesium and uti (tx with mag and 7d of bactrim)   Lab Results  Component Value Date   CREATININE 1.36 (H) 04/15/2019   BUN 22 04/15/2019   NA 138 04/15/2019   K 4.1 04/15/2019   CL 111 04/15/2019   CO2 21 (L) 04/15/2019  off fluid pill  Drinking fluids- more than she was  Lab Results  Component Value Date   WBC 4.8 04/17/2019   HGB 9.2 (L) 04/17/2019   HCT 28.0 (L) 04/17/2019   MCV 103.7 (H) 04/17/2019   PLT 107 (L) 04/17/2019   A1C was up  Lab Results  Component Value Date   HGBA1C 8.5 (H) 03/31/2019   Lab Results  Component Value Date   TSH 1.543 03/30/2019    Lab Results  Component Value Date   CHOL 92 03/31/2019   HDL 20 (L) 03/31/2019   LDLCALC 55 03/31/2019   LDLDIRECT 202.7 08/17/2010   TRIG 84 03/31/2019   CHOLHDL 4.6 03/31/2019     Wt Readings from Last 3 Encounters:  04/29/19 164 lb 8 oz (74.6 kg)  04/28/19 165 lb (74.8 kg)  04/22/19 165 lb (74.8 kg)  appetite is fine  30.09 kg/m   Recommended in f/u to check cbc and bmet and mag nexium  Stool guiac times 3   F/u was made for physical med and rehab pcp Neurology-saw on 10/14 - Dr Leta Baptist   Transitional care call done on 10/13  bp is stable today  No cp or palpitations or headaches or edema  No side  effects to medicines  BP Readings from Last 3 Encounters:  04/29/19 126/68  04/28/19 130/80  04/22/19 (!) 152/80    Accidentally did give losartan today  Was 150s/70s  Then 130/80 yesterday   Lab Results  Component Value Date   CHOL 92 03/31/2019   HDL 20 (L) 03/31/2019   LDLCALC 55 03/31/2019   LDLDIRECT 202.7 08/17/2010   TRIG 84 03/31/2019   CHOLHDL 4.6 03/31/2019    More swelling off her bp medicines   Glucose 120s to 150s   (lower before stopping metformin)  Ears are itchy today   Speech is fine  L arm and leg are still weak -but generally waking up  Having aggressive physical therapy Walking- some days are better than others with a walker  Small stumble yesterday-no injuries   Patient Active Problem List   Diagnosis Date Noted  . Iron deficiency 04/29/2019  . Carpal tunnel syndrome of left wrist   . Myelodysplastic syndrome ([place])   . H/O: CVA (cerebrovascular accident)   . AKI (acute kidney injury) ([place])   . Hypomagnesemia   . CKD (  chronic kidney disease) 03/30/2019  . Recurrent UTI (urinary tract infection) 09/17/2018  . Fall at home 09/17/2018  . Frequent urination 04/29/2018  . Elevated serum creatinine 04/11/2018  . Dysuria 11/12/2017  . SOB (shortness of breath) on exertion 11/12/2017  . History of fall 08/27/2016  . Controlled type 2 diabetes mellitus with diabetic autonomic neuropathy ([place]) 02/24/2016  . Routine general medical examination at a health care facility 02/12/2016  . MDS (myelodysplastic syndrome) ([place]) 10/24/2015  . Normocytic anemia 02/11/2015  . GERD (gastroesophageal reflux disease) 08/06/2014  . Encounter for Medicare annual wellness exam 08/03/2013  . Thrombocytopenia ([place]) 08/03/2013  . Insomnia 01/26/2013  . Hypothyroid 09/17/2011  . Lumbar spinal stenosis 06/21/2011    Class: Present on Admission  . BACK PAIN, LUMBAR 08/23/2010  . POSTNASAL DRIP 08/23/2010  . OSTEOARTHRITIS, HIP 03/16/2009  . BASAL CELL CARCINOMA, FACE  07/01/2007  . BASAL CELL CARCINOMA, FACE 07/01/2007  . DIABETIC PERIPHERAL NEUROPATHY 09/24/2006  . Hyperlipidemia 09/24/2006  . Essential hypertension 09/24/2006  . ROSACEA 09/24/2006  . INCONTINENCE, URGE 09/24/2006  . COLONOSCOPY AND REMOVAL OF LESION, HX OF 06/09/2003   Past Medical History:  Diagnosis Date  . Blood transfusion   . Cancer ([place])    basal cell skin CA  . Diabetes mellitus    type II  . Frequent UTI   . Hyperlipidemia   . Hypertension   . Hypothyroidism   . Macular degeneration of both eyes   . OA (osteoarthritis)   . Stroke [facility]) 03/30/2019   Past Surgical History:  Procedure Laterality Date  . ANKLE FRACTURE SURGERY  10   rt  . CARPAL TUNNEL RELEASE Right 06/06/2017   Procedure: RIGHT CARPAL TUNNEL RELEASE;  Surgeon: Daryll Brod, MD;  Location: [place];  Service: Orthopedics;  Laterality: Right;  . CATARACT EXTRACTION W/ INTRAOCULAR LENS  IMPLANT, BILATERAL Bilateral 2019  . JOINT REPLACEMENT Right 2010   rt total hip 10  . LUMBAR LAMINECTOMY/DECOMPRESSION MICRODISCECTOMY  06/22/2011   Procedure: LUMBAR LAMINECTOMY/DECOMPRESSION MICRODISCECTOMY;  Surgeon: Marybelle Killings;  Location: [place];  Service: Orthopedics;  Laterality: N/A;  L3-4, L4-5 Decompression  . MOHS SURGERY  12/08   basal cell skin cancer lt temple  . TAH      and BSO, age 24  . TONSILLECTOMY     Social History   Tobacco Use  . Smoking status: Never Smoker  . Smokeless tobacco: Never Used  Substance Use Topics  . Alcohol use: No    Alcohol/week: 0.0 standard drinks  . Drug use: No   Family History  Problem Relation Age of Onset  . Arthritis Father        RA  . Hypertension Father   . Heart disease Father        CAD  . Diabetes Father   . Arthritis Sister        RA  . Diabetes Brother   . Cancer Brother   . Diabetes Brother   . Cancer Brother   . Diabetes Brother   . Cancer Brother   . Diabetes Sister   . Cancer Brother    Allergies  Allergen  Reactions  . Keflex [Cephalexin]     Did ok with lower dose, higher dose caused a rash    . Mavik [Trandolapril]     Cough    Current Outpatient Medications on File Prior to Visit  Medication Sig Dispense Refill  . acetaminophen (TYLENOL) 325 MG tablet Take 1-2 tablets (325-650 mg total)  by mouth every 4 (four) hours as needed for mild pain.    [name] aspirin EC 81 MG EC tablet Take 1 tablet (81 mg total) by mouth daily.    . Blood Glucose Calibration (ACCU-CHEK SMARTVIEW CONTROL) LIQD     . Blood Glucose Monitoring Suppl (ACCU-CHEK AVIVA PLUS) w/Device KIT Check blood sugar once daily and as directed.Dx E11.43 1 kit 0  . Cholecalciferol (VITAMIN D PO) Take 5,000 Units by mouth daily.    [name] CRANBERRY PO Take 2 tablets by mouth daily.     [name] glimepiride (AMARYL) 4 MG tablet TAKE 1 TABLET EVERY DAY BEFORE BREAKFAST (Patient taking differently: Take 4 mg by mouth daily with breakfast. ) 90 tablet 3  . glucose blood (ACCU-CHEK AVIVA PLUS) test strip Check blood sugar once daily and as directed.Dx E11.43 100 each 3  . iron polysaccharides (NIFEREX) 150 MG capsule Take 1 capsule (150 mg total) by mouth 2 (two) times daily before lunch and supper. 60 capsule 0  . levothyroxine (SYNTHROID, LEVOTHROID) 50 MCG tablet Take 1 tablet (50 mcg total) by mouth daily. 90 tablet 3  . magnesium gluconate (MAGONATE) 500 MG tablet Take 1 tablet (500 mg total) by mouth daily. 30 tablet 1  . Multiple Vitamin (MULTIVITAMIN) capsule Take 1 capsule by mouth daily.    . Omega-3 Fatty Acids (FISH OIL) 1200 MG CAPS Take 1,200 mg by mouth daily.     [name] senna-docusate (SENOKOT-S) 8.6-50 MG tablet Take 1 tablet by mouth at bedtime as needed for moderate constipation.     No current facility-administered medications on file prior to visit.      Review of Systems  Constitutional: Negative for activity change, appetite change, fatigue, fever and unexpected weight change.  HENT: Negative for congestion, ear pain, rhinorrhea,  sinus pressure and sore throat.   Eyes: Negative for pain, redness and visual disturbance.  Respiratory: Negative for cough, shortness of breath and wheezing.   Cardiovascular: Negative for chest pain and palpitations.  Gastrointestinal: Negative for abdominal pain, blood in stool, constipation and diarrhea.  Endocrine: Negative for polydipsia and polyuria.  Genitourinary: Negative for dysuria, frequency and urgency.  Musculoskeletal: Negative for arthralgias, back pain and myalgias.  Skin: Negative for pallor and rash.  Allergic/Immunologic: Negative for environmental allergies.  Neurological: Positive for weakness. Negative for dizziness, syncope and headaches.       L arm and leg weakness-improving Speech is now normalized  Hematological: Negative for adenopathy. Does not bruise/bleed easily.  Psychiatric/Behavioral: Negative for decreased concentration and dysphoric mood. The patient is not nervous/anxious.        Objective:   Physical Exam Constitutional:      General: She is not in acute distress.    Appearance: Normal appearance. She is well-developed. She is obese. She is not ill-appearing or diaphoretic.     Comments: Using seated walker/wheelchair  HENT:     Head: Normocephalic and atraumatic.  Eyes:     Conjunctiva/sclera: Conjunctivae normal.     Pupils: Pupils are equal, round, and reactive to light.  Neck:     Musculoskeletal: Normal range of motion and neck supple.     Thyroid: No thyromegaly.     Vascular: No carotid bruit or JVD.  Cardiovascular:     Rate and Rhythm: Normal rate and regular rhythm.     Heart sounds: Normal heart sounds. No gallop.   Pulmonary:     Effort: Pulmonary effort is normal. No respiratory distress.     Breath sounds: Normal  breath sounds. No wheezing or rales.  Abdominal:     General: Bowel sounds are normal. There is no distension or abdominal bruit.     Palpations: Abdomen is soft. There is no mass.     Tenderness: There is no  abdominal tenderness.  Lymphadenopathy:     Cervical: No cervical adenopathy.  Skin:    General: Skin is warm and dry.     Findings: No rash.  Neurological:     Mental Status: She is alert.     Sensory: No sensory deficit.     Motor: Weakness present.     Gait: Gait abnormal.     Deep Tendon Reflexes: Reflexes are normal and symmetric.     Comments: No facial droop Nl speech L sided arm/leg weakness affecting gait  Psychiatric:        Mood and Affect: Mood normal.           Assessment & Plan:   Problem List Items Addressed This Visit      Cardiovascular and Mediastinum   Essential hypertension    Off all medications currently due to mt bp (permissive HTN after cva) and renal numbers Doing ok  BP: 126/68  Will continue to watch Bmp done today  May start back on losartan later if bp trends up      Relevant Medications   rosuvastatin (CRESTOR) 10 MG tablet     Endocrine   Hypothyroid    Hypothyroidism  Pt has no clinical changes No change in energy level/ hair or skin/ edema and no tremor Lab Results  Component Value Date   TSH 1.543 03/30/2019          Controlled type 2 diabetes mellitus with diabetic autonomic neuropathy (HCC)    Off metformin due to renal effect  On glimiperide  Good control of glucose since coming home from rehab facility Eating less Will watch this F/u 3 mo      Relevant Medications   rosuvastatin (CRESTOR) 10 MG tablet     Genitourinary   AKI (acute kidney injury) ([place])    Acute on chronic - recent hosp for cva Off metformin and hctz now  Bmp today and advise  faimly is pushing fluids       Relevant Orders   Basic metabolic panel   RESOLVED: E-coli UTI    Treated with bactrim in hosp Reviewed hospital records, lab results and studies in detail   Symptoms resolved        Other   Hyperlipidemia    Well controlled in setting of cva Disc goals for lipids and reasons to control them Rev last labs with pt Rev low sat  fat diet in detail Continues crestor      Relevant Medications   rosuvastatin (CRESTOR) 10 MG tablet   Thrombocytopenia (HCC)    In context of MDS Will have to hold asa for pl ct under 50 Cbc today      Relevant Orders   CBC with Differential/Platelet   MDS (myelodysplastic syndrome) ([place])    Cbc today Continue to follow Will need to hold asa for platelet ct below 50        Relevant Orders   CBC with Differential/Platelet   H/O: CVA (cerebrovascular accident) - Primary    Recent R basal ganglia infarct resulting in L sided hemiparesis and speech problems Reviewed hospital records, lab results and studies in detail  Doing much better sp rehab stay and has f/u with neuro and rehab Continues  asa 81 mg  Off HTN meds and metformin  On crestor  Will watch bp  Continues PT at home-continues to improve        Hypomagnesemia    Taking mag gluconate and tolerating well Mag level today      Relevant Orders   Magnesium   Iron deficiency    In addition to MDS Cbc/ferritin today on iron  Watch GI status  Cannot check heme cards currently due to being on iron  Reviewed hospital records, lab results and studies in detail   Cbc and ferritin today      Relevant Orders   CBC with Differential/Platelet   Ferritin

## 2019-04-29 NOTE — Assessment & Plan Note (Signed)
Off metformin due to renal effect  On glimiperide  Good control of glucose since coming home from rehab facility Eating less Will watch this F/u 3 mo

## 2019-04-29 NOTE — Assessment & Plan Note (Signed)
Hypothyroidism  Pt has no clinical changes No change in energy level/ hair or skin/ edema and no tremor Lab Results  Component Value Date   TSH 1.543 03/30/2019

## 2019-04-29 NOTE — Assessment & Plan Note (Signed)
Cbc today Continue to follow Will need to hold asa for platelet ct below 50

## 2019-04-29 NOTE — Assessment & Plan Note (Signed)
Off all medications currently due to mt bp (permissive HTN after cva) and renal numbers Doing ok  BP: 126/68  Will continue to watch Bmp done today  May start back on losartan later if bp trends up

## 2019-04-29 NOTE — Patient Instructions (Addendum)
Continue to hold blood pressure medicine for now   If blood pressure starts to stay over 140s/90s - let me know  Elevated legs for swelling  Continue support stockings   Stay off metformin  Continue generic amaryl   Follow up in 3 mo   Labs for chem/mag/cbc and iron stores today

## 2019-04-29 NOTE — Assessment & Plan Note (Signed)
In addition to MDS Cbc/ferritin today on iron  Watch GI status  Cannot check heme cards currently due to being on iron  Reviewed hospital records, lab results and studies in detail   Cbc and ferritin today

## 2019-04-29 NOTE — Assessment & Plan Note (Signed)
Recent R basal ganglia infarct resulting in L sided hemiparesis and speech problems Reviewed hospital records, lab results and studies in detail  Doing much better sp rehab stay and has f/u with neuro and rehab Continues asa 81 mg  Off HTN meds and metformin  On crestor  Will watch bp  Continues PT at home-continues to improve

## 2019-04-29 NOTE — Assessment & Plan Note (Signed)
Well controlled in setting of cva Disc goals for lipids and reasons to control them Rev last labs with pt Rev low sat fat diet in detail Continues crestor

## 2019-04-30 DIAGNOSIS — E1122 Type 2 diabetes mellitus with diabetic chronic kidney disease: Secondary | ICD-10-CM | POA: Diagnosis not present

## 2019-04-30 DIAGNOSIS — I129 Hypertensive chronic kidney disease with stage 1 through stage 4 chronic kidney disease, or unspecified chronic kidney disease: Secondary | ICD-10-CM | POA: Diagnosis not present

## 2019-04-30 DIAGNOSIS — I69322 Dysarthria following cerebral infarction: Secondary | ICD-10-CM | POA: Diagnosis not present

## 2019-04-30 DIAGNOSIS — E785 Hyperlipidemia, unspecified: Secondary | ICD-10-CM | POA: Diagnosis not present

## 2019-04-30 DIAGNOSIS — D631 Anemia in chronic kidney disease: Secondary | ICD-10-CM | POA: Diagnosis not present

## 2019-04-30 DIAGNOSIS — I69354 Hemiplegia and hemiparesis following cerebral infarction affecting left non-dominant side: Secondary | ICD-10-CM | POA: Diagnosis not present

## 2019-04-30 DIAGNOSIS — D469 Myelodysplastic syndrome, unspecified: Secondary | ICD-10-CM | POA: Diagnosis not present

## 2019-04-30 DIAGNOSIS — E1142 Type 2 diabetes mellitus with diabetic polyneuropathy: Secondary | ICD-10-CM | POA: Diagnosis not present

## 2019-04-30 DIAGNOSIS — N183 Chronic kidney disease, stage 3 unspecified: Secondary | ICD-10-CM | POA: Diagnosis not present

## 2019-05-05 DIAGNOSIS — E1122 Type 2 diabetes mellitus with diabetic chronic kidney disease: Secondary | ICD-10-CM | POA: Diagnosis not present

## 2019-05-05 DIAGNOSIS — N183 Chronic kidney disease, stage 3 unspecified: Secondary | ICD-10-CM | POA: Diagnosis not present

## 2019-05-05 DIAGNOSIS — D631 Anemia in chronic kidney disease: Secondary | ICD-10-CM | POA: Diagnosis not present

## 2019-05-05 DIAGNOSIS — I129 Hypertensive chronic kidney disease with stage 1 through stage 4 chronic kidney disease, or unspecified chronic kidney disease: Secondary | ICD-10-CM | POA: Diagnosis not present

## 2019-05-05 DIAGNOSIS — D469 Myelodysplastic syndrome, unspecified: Secondary | ICD-10-CM | POA: Diagnosis not present

## 2019-05-05 DIAGNOSIS — I69354 Hemiplegia and hemiparesis following cerebral infarction affecting left non-dominant side: Secondary | ICD-10-CM | POA: Diagnosis not present

## 2019-05-05 DIAGNOSIS — E785 Hyperlipidemia, unspecified: Secondary | ICD-10-CM | POA: Diagnosis not present

## 2019-05-05 DIAGNOSIS — E1142 Type 2 diabetes mellitus with diabetic polyneuropathy: Secondary | ICD-10-CM | POA: Diagnosis not present

## 2019-05-05 DIAGNOSIS — I69322 Dysarthria following cerebral infarction: Secondary | ICD-10-CM | POA: Diagnosis not present

## 2019-05-07 DIAGNOSIS — I69322 Dysarthria following cerebral infarction: Secondary | ICD-10-CM | POA: Diagnosis not present

## 2019-05-07 DIAGNOSIS — N183 Chronic kidney disease, stage 3 unspecified: Secondary | ICD-10-CM | POA: Diagnosis not present

## 2019-05-07 DIAGNOSIS — E1122 Type 2 diabetes mellitus with diabetic chronic kidney disease: Secondary | ICD-10-CM | POA: Diagnosis not present

## 2019-05-07 DIAGNOSIS — D469 Myelodysplastic syndrome, unspecified: Secondary | ICD-10-CM | POA: Diagnosis not present

## 2019-05-07 DIAGNOSIS — D631 Anemia in chronic kidney disease: Secondary | ICD-10-CM | POA: Diagnosis not present

## 2019-05-07 DIAGNOSIS — I129 Hypertensive chronic kidney disease with stage 1 through stage 4 chronic kidney disease, or unspecified chronic kidney disease: Secondary | ICD-10-CM | POA: Diagnosis not present

## 2019-05-07 DIAGNOSIS — I69354 Hemiplegia and hemiparesis following cerebral infarction affecting left non-dominant side: Secondary | ICD-10-CM | POA: Diagnosis not present

## 2019-05-07 DIAGNOSIS — E1142 Type 2 diabetes mellitus with diabetic polyneuropathy: Secondary | ICD-10-CM | POA: Diagnosis not present

## 2019-05-07 DIAGNOSIS — E785 Hyperlipidemia, unspecified: Secondary | ICD-10-CM | POA: Diagnosis not present

## 2019-05-08 ENCOUNTER — Telehealth: Payer: Self-pay | Admitting: Family Medicine

## 2019-05-08 MED ORDER — TRUE COMFORT TWIST TOP LANCETS MISC: 1.0000 | Freq: Every day | 1 refills | Status: DC

## 2019-05-08 MED ORDER — TRUE METRIX BLOOD GLUCOSE TEST VI STRP: ORAL_STRIP | 1 refills | Status: DC

## 2019-05-08 NOTE — Telephone Encounter (Signed)
Rx sent to pharmacy   

## 2019-05-08 NOTE — Telephone Encounter (Signed)
Stacey Harris Advertising account planner) called pt needs refill on test strips  (she has some but the expire tomoorrow)  And lancets  Meter is   [facility] [address]

## 2019-05-11 ENCOUNTER — Other Ambulatory Visit: Payer: Self-pay | Admitting: Physical Medicine and Rehabilitation

## 2019-05-11 DIAGNOSIS — D469 Myelodysplastic syndrome, unspecified: Secondary | ICD-10-CM | POA: Diagnosis not present

## 2019-05-11 DIAGNOSIS — E785 Hyperlipidemia, unspecified: Secondary | ICD-10-CM | POA: Diagnosis not present

## 2019-05-11 DIAGNOSIS — E1142 Type 2 diabetes mellitus with diabetic polyneuropathy: Secondary | ICD-10-CM | POA: Diagnosis not present

## 2019-05-11 DIAGNOSIS — I129 Hypertensive chronic kidney disease with stage 1 through stage 4 chronic kidney disease, or unspecified chronic kidney disease: Secondary | ICD-10-CM | POA: Diagnosis not present

## 2019-05-11 DIAGNOSIS — I69354 Hemiplegia and hemiparesis following cerebral infarction affecting left non-dominant side: Secondary | ICD-10-CM | POA: Diagnosis not present

## 2019-05-11 DIAGNOSIS — I69322 Dysarthria following cerebral infarction: Secondary | ICD-10-CM | POA: Diagnosis not present

## 2019-05-11 DIAGNOSIS — N183 Chronic kidney disease, stage 3 unspecified: Secondary | ICD-10-CM | POA: Diagnosis not present

## 2019-05-11 DIAGNOSIS — D631 Anemia in chronic kidney disease: Secondary | ICD-10-CM | POA: Diagnosis not present

## 2019-05-11 DIAGNOSIS — E1122 Type 2 diabetes mellitus with diabetic chronic kidney disease: Secondary | ICD-10-CM | POA: Diagnosis not present

## 2019-05-12 DIAGNOSIS — I69322 Dysarthria following cerebral infarction: Secondary | ICD-10-CM | POA: Diagnosis not present

## 2019-05-12 DIAGNOSIS — D469 Myelodysplastic syndrome, unspecified: Secondary | ICD-10-CM | POA: Diagnosis not present

## 2019-05-12 DIAGNOSIS — I69354 Hemiplegia and hemiparesis following cerebral infarction affecting left non-dominant side: Secondary | ICD-10-CM | POA: Diagnosis not present

## 2019-05-12 DIAGNOSIS — I129 Hypertensive chronic kidney disease with stage 1 through stage 4 chronic kidney disease, or unspecified chronic kidney disease: Secondary | ICD-10-CM | POA: Diagnosis not present

## 2019-05-12 DIAGNOSIS — E785 Hyperlipidemia, unspecified: Secondary | ICD-10-CM | POA: Diagnosis not present

## 2019-05-12 DIAGNOSIS — E1122 Type 2 diabetes mellitus with diabetic chronic kidney disease: Secondary | ICD-10-CM | POA: Diagnosis not present

## 2019-05-12 DIAGNOSIS — E1142 Type 2 diabetes mellitus with diabetic polyneuropathy: Secondary | ICD-10-CM | POA: Diagnosis not present

## 2019-05-12 DIAGNOSIS — D631 Anemia in chronic kidney disease: Secondary | ICD-10-CM | POA: Diagnosis not present

## 2019-05-12 DIAGNOSIS — N183 Chronic kidney disease, stage 3 unspecified: Secondary | ICD-10-CM | POA: Diagnosis not present

## 2019-05-13 ENCOUNTER — Telehealth: Payer: Self-pay | Admitting: *Deleted

## 2019-05-13 NOTE — Telephone Encounter (Signed)
Left VM giving Connie the verbal order  

## 2019-05-13 NOTE — Telephone Encounter (Signed)
Stacey Harris with [facility] called requesting orders for OT for twice a week for two weeks. Stacey Harris requested a call back with verbal orders.

## 2019-05-13 NOTE — Telephone Encounter (Signed)
Please ok those verbal orders  

## 2019-05-14 DIAGNOSIS — N183 Chronic kidney disease, stage 3 unspecified: Secondary | ICD-10-CM | POA: Diagnosis not present

## 2019-05-14 DIAGNOSIS — D631 Anemia in chronic kidney disease: Secondary | ICD-10-CM | POA: Diagnosis not present

## 2019-05-14 DIAGNOSIS — E1122 Type 2 diabetes mellitus with diabetic chronic kidney disease: Secondary | ICD-10-CM | POA: Diagnosis not present

## 2019-05-14 DIAGNOSIS — E1142 Type 2 diabetes mellitus with diabetic polyneuropathy: Secondary | ICD-10-CM | POA: Diagnosis not present

## 2019-05-14 DIAGNOSIS — I69322 Dysarthria following cerebral infarction: Secondary | ICD-10-CM | POA: Diagnosis not present

## 2019-05-14 DIAGNOSIS — I129 Hypertensive chronic kidney disease with stage 1 through stage 4 chronic kidney disease, or unspecified chronic kidney disease: Secondary | ICD-10-CM | POA: Diagnosis not present

## 2019-05-14 DIAGNOSIS — I69354 Hemiplegia and hemiparesis following cerebral infarction affecting left non-dominant side: Secondary | ICD-10-CM | POA: Diagnosis not present

## 2019-05-14 DIAGNOSIS — E785 Hyperlipidemia, unspecified: Secondary | ICD-10-CM | POA: Diagnosis not present

## 2019-05-14 DIAGNOSIS — D469 Myelodysplastic syndrome, unspecified: Secondary | ICD-10-CM | POA: Diagnosis not present

## 2019-05-15 DIAGNOSIS — I69322 Dysarthria following cerebral infarction: Secondary | ICD-10-CM | POA: Diagnosis not present

## 2019-05-15 DIAGNOSIS — D469 Myelodysplastic syndrome, unspecified: Secondary | ICD-10-CM | POA: Diagnosis not present

## 2019-05-15 DIAGNOSIS — I129 Hypertensive chronic kidney disease with stage 1 through stage 4 chronic kidney disease, or unspecified chronic kidney disease: Secondary | ICD-10-CM | POA: Diagnosis not present

## 2019-05-15 DIAGNOSIS — I69354 Hemiplegia and hemiparesis following cerebral infarction affecting left non-dominant side: Secondary | ICD-10-CM | POA: Diagnosis not present

## 2019-05-15 DIAGNOSIS — E1142 Type 2 diabetes mellitus with diabetic polyneuropathy: Secondary | ICD-10-CM | POA: Diagnosis not present

## 2019-05-15 DIAGNOSIS — E1122 Type 2 diabetes mellitus with diabetic chronic kidney disease: Secondary | ICD-10-CM | POA: Diagnosis not present

## 2019-05-15 DIAGNOSIS — E785 Hyperlipidemia, unspecified: Secondary | ICD-10-CM | POA: Diagnosis not present

## 2019-05-15 DIAGNOSIS — D631 Anemia in chronic kidney disease: Secondary | ICD-10-CM | POA: Diagnosis not present

## 2019-05-15 DIAGNOSIS — N183 Chronic kidney disease, stage 3 unspecified: Secondary | ICD-10-CM | POA: Diagnosis not present

## 2019-05-16 ENCOUNTER — Other Ambulatory Visit: Payer: Self-pay | Admitting: Physical Medicine and Rehabilitation

## 2019-05-18 ENCOUNTER — Other Ambulatory Visit: Payer: Self-pay | Admitting: Family Medicine

## 2019-05-18 DIAGNOSIS — I69322 Dysarthria following cerebral infarction: Secondary | ICD-10-CM | POA: Diagnosis not present

## 2019-05-18 DIAGNOSIS — Z85828 Personal history of other malignant neoplasm of skin: Secondary | ICD-10-CM

## 2019-05-18 DIAGNOSIS — E785 Hyperlipidemia, unspecified: Secondary | ICD-10-CM

## 2019-05-18 DIAGNOSIS — N183 Chronic kidney disease, stage 3 unspecified: Secondary | ICD-10-CM

## 2019-05-18 DIAGNOSIS — D469 Myelodysplastic syndrome, unspecified: Secondary | ICD-10-CM

## 2019-05-18 DIAGNOSIS — Z87440 Personal history of urinary (tract) infections: Secondary | ICD-10-CM

## 2019-05-18 DIAGNOSIS — E1142 Type 2 diabetes mellitus with diabetic polyneuropathy: Secondary | ICD-10-CM | POA: Diagnosis not present

## 2019-05-18 DIAGNOSIS — E1122 Type 2 diabetes mellitus with diabetic chronic kidney disease: Secondary | ICD-10-CM

## 2019-05-18 DIAGNOSIS — Z7984 Long term (current) use of oral hypoglycemic drugs: Secondary | ICD-10-CM

## 2019-05-18 DIAGNOSIS — D631 Anemia in chronic kidney disease: Secondary | ICD-10-CM

## 2019-05-18 DIAGNOSIS — I69354 Hemiplegia and hemiparesis following cerebral infarction affecting left non-dominant side: Secondary | ICD-10-CM | POA: Diagnosis not present

## 2019-05-18 DIAGNOSIS — I129 Hypertensive chronic kidney disease with stage 1 through stage 4 chronic kidney disease, or unspecified chronic kidney disease: Secondary | ICD-10-CM | POA: Diagnosis not present

## 2019-05-18 DIAGNOSIS — E039 Hypothyroidism, unspecified: Secondary | ICD-10-CM

## 2019-05-18 DIAGNOSIS — K5900 Constipation, unspecified: Secondary | ICD-10-CM

## 2019-05-18 MED ORDER — MAGNESIUM GLUCONATE 500 MG PO TABS: 500.0000 mg | ORAL_TABLET | Freq: Every day | ORAL | 0 refills | Status: DC

## 2019-05-18 NOTE — Telephone Encounter (Signed)
Patient's Daughter stated she spoke with the pharmacy about her mother's magnesium medication. They advised her to call our office due to [place] not approving the medication refill.    Please advise

## 2019-05-18 NOTE — Telephone Encounter (Signed)
Looks like hospital prescribed Rx in, is it okay to refill

## 2019-05-18 NOTE — Telephone Encounter (Signed)
Her magnesium level was in the low nl range at last check  Take this for another month and then can stop it Thanks

## 2019-05-18 NOTE — Telephone Encounter (Signed)
Blanch Media notified of Dr. Marliss Coots instructions and verbalized understanding

## 2019-05-19 DIAGNOSIS — I129 Hypertensive chronic kidney disease with stage 1 through stage 4 chronic kidney disease, or unspecified chronic kidney disease: Secondary | ICD-10-CM | POA: Diagnosis not present

## 2019-05-19 DIAGNOSIS — E785 Hyperlipidemia, unspecified: Secondary | ICD-10-CM | POA: Diagnosis not present

## 2019-05-19 DIAGNOSIS — I69354 Hemiplegia and hemiparesis following cerebral infarction affecting left non-dominant side: Secondary | ICD-10-CM | POA: Diagnosis not present

## 2019-05-19 DIAGNOSIS — I69322 Dysarthria following cerebral infarction: Secondary | ICD-10-CM | POA: Diagnosis not present

## 2019-05-19 DIAGNOSIS — D631 Anemia in chronic kidney disease: Secondary | ICD-10-CM | POA: Diagnosis not present

## 2019-05-19 DIAGNOSIS — D469 Myelodysplastic syndrome, unspecified: Secondary | ICD-10-CM | POA: Diagnosis not present

## 2019-05-19 DIAGNOSIS — E1142 Type 2 diabetes mellitus with diabetic polyneuropathy: Secondary | ICD-10-CM | POA: Diagnosis not present

## 2019-05-19 DIAGNOSIS — E1122 Type 2 diabetes mellitus with diabetic chronic kidney disease: Secondary | ICD-10-CM | POA: Diagnosis not present

## 2019-05-19 DIAGNOSIS — N183 Chronic kidney disease, stage 3 unspecified: Secondary | ICD-10-CM | POA: Diagnosis not present

## 2019-05-20 DIAGNOSIS — E1122 Type 2 diabetes mellitus with diabetic chronic kidney disease: Secondary | ICD-10-CM | POA: Diagnosis not present

## 2019-05-20 DIAGNOSIS — I69322 Dysarthria following cerebral infarction: Secondary | ICD-10-CM | POA: Diagnosis not present

## 2019-05-20 DIAGNOSIS — E1142 Type 2 diabetes mellitus with diabetic polyneuropathy: Secondary | ICD-10-CM | POA: Diagnosis not present

## 2019-05-20 DIAGNOSIS — E785 Hyperlipidemia, unspecified: Secondary | ICD-10-CM | POA: Diagnosis not present

## 2019-05-20 DIAGNOSIS — N183 Chronic kidney disease, stage 3 unspecified: Secondary | ICD-10-CM | POA: Diagnosis not present

## 2019-05-20 DIAGNOSIS — I129 Hypertensive chronic kidney disease with stage 1 through stage 4 chronic kidney disease, or unspecified chronic kidney disease: Secondary | ICD-10-CM | POA: Diagnosis not present

## 2019-05-20 DIAGNOSIS — D631 Anemia in chronic kidney disease: Secondary | ICD-10-CM | POA: Diagnosis not present

## 2019-05-20 DIAGNOSIS — I69354 Hemiplegia and hemiparesis following cerebral infarction affecting left non-dominant side: Secondary | ICD-10-CM | POA: Diagnosis not present

## 2019-05-20 DIAGNOSIS — D469 Myelodysplastic syndrome, unspecified: Secondary | ICD-10-CM | POA: Diagnosis not present

## 2019-05-21 DIAGNOSIS — I69322 Dysarthria following cerebral infarction: Secondary | ICD-10-CM | POA: Diagnosis not present

## 2019-05-21 DIAGNOSIS — I69354 Hemiplegia and hemiparesis following cerebral infarction affecting left non-dominant side: Secondary | ICD-10-CM | POA: Diagnosis not present

## 2019-05-21 DIAGNOSIS — E785 Hyperlipidemia, unspecified: Secondary | ICD-10-CM | POA: Diagnosis not present

## 2019-05-21 DIAGNOSIS — I129 Hypertensive chronic kidney disease with stage 1 through stage 4 chronic kidney disease, or unspecified chronic kidney disease: Secondary | ICD-10-CM | POA: Diagnosis not present

## 2019-05-21 DIAGNOSIS — N183 Chronic kidney disease, stage 3 unspecified: Secondary | ICD-10-CM | POA: Diagnosis not present

## 2019-05-21 DIAGNOSIS — E1142 Type 2 diabetes mellitus with diabetic polyneuropathy: Secondary | ICD-10-CM | POA: Diagnosis not present

## 2019-05-21 DIAGNOSIS — D631 Anemia in chronic kidney disease: Secondary | ICD-10-CM | POA: Diagnosis not present

## 2019-05-21 DIAGNOSIS — E1122 Type 2 diabetes mellitus with diabetic chronic kidney disease: Secondary | ICD-10-CM | POA: Diagnosis not present

## 2019-05-21 DIAGNOSIS — D469 Myelodysplastic syndrome, unspecified: Secondary | ICD-10-CM | POA: Diagnosis not present

## 2019-05-25 ENCOUNTER — Encounter: Payer: Self-pay | Admitting: Physical Medicine and Rehabilitation

## 2019-05-25 ENCOUNTER — Other Ambulatory Visit: Payer: Self-pay

## 2019-05-25 ENCOUNTER — Encounter: Payer: Medicare HMO | Attending: Registered Nurse | Admitting: Physical Medicine and Rehabilitation

## 2019-05-25 ENCOUNTER — Encounter: Payer: Medicare HMO | Admitting: Physical Medicine and Rehabilitation

## 2019-05-25 VITALS — BP 162/61 | HR 64 | Ht 62.0 in | Wt 167.0 lb

## 2019-05-25 DIAGNOSIS — I639 Cerebral infarction, unspecified: Secondary | ICD-10-CM | POA: Diagnosis not present

## 2019-05-25 DIAGNOSIS — I6381 Other cerebral infarction due to occlusion or stenosis of small artery: Secondary | ICD-10-CM

## 2019-05-25 DIAGNOSIS — D469 Myelodysplastic syndrome, unspecified: Secondary | ICD-10-CM | POA: Insufficient documentation

## 2019-05-25 DIAGNOSIS — I10 Essential (primary) hypertension: Secondary | ICD-10-CM | POA: Diagnosis not present

## 2019-05-25 DIAGNOSIS — E1165 Type 2 diabetes mellitus with hyperglycemia: Secondary | ICD-10-CM | POA: Insufficient documentation

## 2019-05-25 NOTE — Progress Notes (Signed)
Subjective:    Patient ID: Stacey Harris, female    DOB: 1929/02/16, 85 y.o.   MRN: [id number]  HPI Mrs. Stacey Harris presents for follow-up of her right thalamic stroke. She has been feeling well with no pain. She has been engaging in home PT twice per week and has been working on both upper and lower extremity strengthening, particularly on her left side.   She has been needing to use the bathroom every hour of the night. Does not take any medications that can cause increased urinary frequency. She also has difficulty sleeping at night.   Pain Inventory Average Pain 0 Pain Right Now 0 My pain is no pain  In the last 24 hours, has pain interfered with the following? General activity 0 Relation with others 0 Enjoyment of life 0 What TIME of day is your pain at its worst? no pain Sleep (in general) Poor  Pain is worse with: no pain Pain improves with: no pain Relief from Meds: no pain  Mobility walk with assistance use a walker how many minutes can you walk? 3 ability to climb steps?  no do you drive?  no needs help with transfers Do you have any goals in this area?  yes  Function retired I need assistance with the following:  feeding, dressing, bathing, toileting, meal prep, household duties and shopping  Neuro/Psych bladder control problems weakness numbness trouble walking  Prior Studies Any changes since last visit?  no  Physicians involved in your care Any changes since last visit?  no   Family History  Problem Relation Age of Onset  . Arthritis Father        RA  . Hypertension Father   . Heart disease Father        CAD  . Diabetes Father   . Arthritis Sister        RA  . Diabetes Brother   . Cancer Brother   . Diabetes Brother   . Cancer Brother   . Diabetes Brother   . Cancer Brother   . Diabetes Sister   . Cancer Brother    Social History   Socioeconomic History  . Marital status: Widowed    Spouse name: Not on file  . Number of  children: 4  . Years of education: 51  . Highest education level: Not on file  Occupational History  . Not on file  Social Needs  . Financial resource strain: Not on file  . Food insecurity    Worry: Not on file    Inability: Not on file  . Transportation needs    Medical: Not on file    Non-medical: Not on file  Tobacco Use  . Smoking status: Never Smoker  . Smokeless tobacco: Never Used  Substance and Sexual Activity  . Alcohol use: No    Alcohol/week: 0.0 standard drinks  . Drug use: No  . Sexual activity: Never    Birth control/protection: Post-menopausal  Lifestyle  . Physical activity    Days per week: Not on file    Minutes per session: Not on file  . Stress: Not on file  Relationships  . Social Herbalist on phone: Not on file    Gets together: Not on file    Attends religious service: Not on file    Active member of club or organization: Not on file    Attends meetings of clubs or organizations: Not on file    Relationship status: Not on  file  Other Topics Concern  . Not on file  Social History Narrative   04/22/19 Lives with daughter, Stacey Harris   No caffeine   Past Surgical History:  Procedure Laterality Date  . ANKLE FRACTURE SURGERY  10   rt  . CARPAL TUNNEL RELEASE Right 06/06/2017   Procedure: RIGHT CARPAL TUNNEL RELEASE;  Surgeon: Daryll Brod, MD;  Location: [place];  Service: Orthopedics;  Laterality: Right;  . CATARACT EXTRACTION W/ INTRAOCULAR LENS  IMPLANT, BILATERAL Bilateral 2019  . JOINT REPLACEMENT Right 2010   rt total hip 10  . LUMBAR LAMINECTOMY/DECOMPRESSION MICRODISCECTOMY  06/22/2011   Procedure: LUMBAR LAMINECTOMY/DECOMPRESSION MICRODISCECTOMY;  Surgeon: Marybelle Killings;  Location: [place];  Service: Orthopedics;  Laterality: N/A;  L3-4, L4-5 Decompression  . MOHS SURGERY  12/08   basal cell skin cancer lt temple  . TAH      and BSO, age 65  . TONSILLECTOMY     Past Medical History:  Diagnosis Date  . Blood  transfusion   . Cancer ([place])    basal cell skin CA  . Diabetes mellitus    type II  . Frequent UTI   . Hyperlipidemia   . Hypertension   . Hypothyroidism   . Macular degeneration of both eyes   . OA (osteoarthritis)   . Stroke (Stacey Harris) 03/30/2019   BP (!) 162/61   Pulse 64   Ht 5\' 2"  (1.575 m)   Wt 167 lb (75.8 kg)   SpO2 96%   BMI 30.54 kg/m   Opioid Risk Score:   Fall Risk Score:  `1  Depression screen PHQ 2/9  Depression screen [facility] 2/9 04/08/2018 02/04/2017 12/30/2015 08/06/2014 08/03/2013 04/29/2012  Decreased Interest 0 0 0 0 0 0  Down, Depressed, Hopeless 1 1 0 0 0 0  PHQ - 2 Score 1 1 0 0 0 0  Altered sleeping 0 3 - - - -  Tired, decreased energy 0 3 - - - -  Change in appetite 0 0 - - - -  Feeling bad or failure about yourself  0 1 - - - -  Trouble concentrating 0 0 - - - -  Moving slowly or fidgety/restless 0 0 - - - -  Suicidal thoughts - 0 - - - -  PHQ-9 Score 1 8 - - - -  Difficult doing work/chores - Not difficult at all - - - -    Review of Systems  Constitutional: Negative.   HENT: Negative.   Eyes: Negative.   Respiratory: Positive for shortness of breath.   Cardiovascular: Positive for leg swelling.  Gastrointestinal: Negative.   Endocrine: Negative.   Genitourinary: Positive for frequency.  Musculoskeletal: Positive for gait problem.  Allergic/Immunologic: Negative.   Neurological: Positive for weakness and numbness.  Hematological: Negative.   Psychiatric/Behavioral: Negative.   All other systems reviewed and are negative.      Objective:   Physical Exam  Gen: no distress, normal appearing HEENT: oral mucosa pink and moist, NCAT Cardio: Reg rate Chest: normal effort, normal rate of breathing Abd: soft, non-distended Ext: no edema Skin: intact Neuro/Musculoskeletal: Alert and oriented. 4/5 strength throughout. Able to walk comfortably in office with RW.  Psych: pleasant, normal affect      Assessment & Plan:  Mrs. Stacey Harris presents  for follow-up of her right thalamic stroke.   --Continue home PT for strengthening and improving mobility --Recommended daily time outside, both for the exercise of walking outdoors as well as for the  mood benefits of being outdoors --Continue reading and other activities of joy. --For urinary frequency, discussed the option of urodynamic testing, or trial of medication. For now, family would prefer conservative management. I encouraged exercise during the day to increase sleep quality at night. Discussed sleep aid options. Daughter likes the idea of chamomile tea, no screen time, and reading before bed. --Reviewed medications. No constipation at this time. Using the Senna PRN.  --Given distance from home, return to clinic PRN. Discussed the option of scheduling Webex appointments as well.  Thirty minutes of face to face patient care time were spent during this visit. All questions were encouraged and answered.

## 2019-05-26 ENCOUNTER — Ambulatory Visit: Payer: Medicare HMO | Admitting: Registered Nurse

## 2019-05-26 DIAGNOSIS — D469 Myelodysplastic syndrome, unspecified: Secondary | ICD-10-CM | POA: Diagnosis not present

## 2019-05-26 DIAGNOSIS — E1122 Type 2 diabetes mellitus with diabetic chronic kidney disease: Secondary | ICD-10-CM | POA: Diagnosis not present

## 2019-05-26 DIAGNOSIS — I69322 Dysarthria following cerebral infarction: Secondary | ICD-10-CM | POA: Diagnosis not present

## 2019-05-26 DIAGNOSIS — D631 Anemia in chronic kidney disease: Secondary | ICD-10-CM | POA: Diagnosis not present

## 2019-05-26 DIAGNOSIS — E1142 Type 2 diabetes mellitus with diabetic polyneuropathy: Secondary | ICD-10-CM | POA: Diagnosis not present

## 2019-05-26 DIAGNOSIS — I129 Hypertensive chronic kidney disease with stage 1 through stage 4 chronic kidney disease, or unspecified chronic kidney disease: Secondary | ICD-10-CM | POA: Diagnosis not present

## 2019-05-26 DIAGNOSIS — N183 Chronic kidney disease, stage 3 unspecified: Secondary | ICD-10-CM | POA: Diagnosis not present

## 2019-05-26 DIAGNOSIS — E785 Hyperlipidemia, unspecified: Secondary | ICD-10-CM | POA: Diagnosis not present

## 2019-05-26 DIAGNOSIS — I69354 Hemiplegia and hemiparesis following cerebral infarction affecting left non-dominant side: Secondary | ICD-10-CM | POA: Diagnosis not present

## 2019-06-02 DIAGNOSIS — I69354 Hemiplegia and hemiparesis following cerebral infarction affecting left non-dominant side: Secondary | ICD-10-CM | POA: Diagnosis not present

## 2019-06-02 DIAGNOSIS — E1142 Type 2 diabetes mellitus with diabetic polyneuropathy: Secondary | ICD-10-CM | POA: Diagnosis not present

## 2019-06-02 DIAGNOSIS — N183 Chronic kidney disease, stage 3 unspecified: Secondary | ICD-10-CM | POA: Diagnosis not present

## 2019-06-02 DIAGNOSIS — E1122 Type 2 diabetes mellitus with diabetic chronic kidney disease: Secondary | ICD-10-CM | POA: Diagnosis not present

## 2019-06-02 DIAGNOSIS — D469 Myelodysplastic syndrome, unspecified: Secondary | ICD-10-CM | POA: Diagnosis not present

## 2019-06-02 DIAGNOSIS — D631 Anemia in chronic kidney disease: Secondary | ICD-10-CM | POA: Diagnosis not present

## 2019-06-02 DIAGNOSIS — E785 Hyperlipidemia, unspecified: Secondary | ICD-10-CM | POA: Diagnosis not present

## 2019-06-02 DIAGNOSIS — I69322 Dysarthria following cerebral infarction: Secondary | ICD-10-CM | POA: Diagnosis not present

## 2019-06-02 DIAGNOSIS — I129 Hypertensive chronic kidney disease with stage 1 through stage 4 chronic kidney disease, or unspecified chronic kidney disease: Secondary | ICD-10-CM | POA: Diagnosis not present

## 2019-06-09 ENCOUNTER — Other Ambulatory Visit: Payer: Self-pay | Admitting: Family Medicine

## 2019-06-10 ENCOUNTER — Telehealth: Payer: Self-pay

## 2019-06-10 NOTE — Telephone Encounter (Addendum)
Stacey Harris(DPR signed) said that for 1 wk the bad urine odor has been increasing and pt has frequency of urine. No burning or pain with urination and no more confusion than usual. No fever. Pt has no covid symptoms, no travel and no known exposure to + covid. Stacey Harris said it is difficult to get pt out and wants to know if can pick up urine kit to bring back a urine specimen instead of making an appt. Stacey Harris request cb. [facility] [address].

## 2019-06-10 NOTE — Telephone Encounter (Signed)
[name] notified of Dr. Marliss Coots comments. They will get UA supplies and drop off sample tomorrow

## 2019-06-10 NOTE — Telephone Encounter (Signed)
That is fine -she has frequent utis I may want to arrange a phone visit depending on the results  Please give inst re: getting sample in

## 2019-06-11 ENCOUNTER — Other Ambulatory Visit (INDEPENDENT_AMBULATORY_CARE_PROVIDER_SITE_OTHER): Payer: Medicare HMO

## 2019-06-11 ENCOUNTER — Other Ambulatory Visit: Payer: Self-pay

## 2019-06-11 ENCOUNTER — Telehealth: Payer: Self-pay | Admitting: Family Medicine

## 2019-06-11 DIAGNOSIS — R410 Disorientation, unspecified: Secondary | ICD-10-CM | POA: Diagnosis not present

## 2019-06-11 DIAGNOSIS — R8290 Unspecified abnormal findings in urine: Secondary | ICD-10-CM

## 2019-06-11 LAB — POC URINALSYSI DIPSTICK (AUTOMATED)
Bilirubin, UA: NEGATIVE
Blood, UA: 25
Glucose, UA: NEGATIVE
Ketones, UA: NEGATIVE
Nitrite, UA: NEGATIVE
Protein, UA: POSITIVE — AB
Spec Grav, UA: 1.025 (ref 1.010–1.025)
Urobilinogen, UA: 0.2 E.U./dL
pH, UA: 6 (ref 5.0–8.0)

## 2019-06-11 MED ORDER — SULFAMETHOXAZOLE-TRIMETHOPRIM 800-160 MG PO TABS: 1.0000 | ORAL_TABLET | Freq: Two times a day (BID) | ORAL | 0 refills | Status: DC

## 2019-06-11 NOTE — Progress Notes (Signed)
poct

## 2019-06-11 NOTE — Telephone Encounter (Signed)
I sent bactrim ds five day course to her [facility] while waiting for the culture  ua is positive

## 2019-06-12 NOTE — Telephone Encounter (Signed)
Left VM letting Stacey Harris know Dr. Marliss Coots comments

## 2019-06-13 LAB — URINE CULTURE
MICRO NUMBER:: 1160320
SPECIMEN QUALITY:: ADEQUATE

## 2019-06-15 ENCOUNTER — Telehealth: Payer: Self-pay | Admitting: *Deleted

## 2019-06-15 NOTE — Telephone Encounter (Signed)
Left VM requesting daughter [name] to call the office back regarding Urine Cx and to check in and see how pt's doing

## 2019-06-15 NOTE — Telephone Encounter (Signed)
-----   Message from [name], MD sent at 06/14/2019  2:17 PM EST ----- The bactrim should work for this uti  Please check in and see how she is doing

## 2019-06-16 NOTE — Telephone Encounter (Signed)
Patient's daughter returned call.

## 2019-06-16 NOTE — Telephone Encounter (Signed)
See result notes Nothing further needed.

## 2019-06-23 ENCOUNTER — Other Ambulatory Visit: Payer: Self-pay | Admitting: *Deleted

## 2019-06-23 MED ORDER — LEVOTHYROXINE SODIUM 50 MCG PO TABS: 50.0000 ug | ORAL_TABLET | Freq: Every day | ORAL | 0 refills | Status: DC

## 2019-06-23 MED ORDER — GLIMEPIRIDE 4 MG PO TABS: ORAL_TABLET | ORAL | 0 refills | Status: DC

## 2019-07-31 ENCOUNTER — Other Ambulatory Visit: Payer: Self-pay

## 2019-07-31 ENCOUNTER — Ambulatory Visit (INDEPENDENT_AMBULATORY_CARE_PROVIDER_SITE_OTHER): Payer: Medicare HMO | Admitting: Family Medicine

## 2019-07-31 ENCOUNTER — Encounter: Payer: Self-pay | Admitting: Family Medicine

## 2019-07-31 VITALS — BP 160/86 | HR 69 | Temp 97.8°F | Ht 62.0 in | Wt 165.1 lb

## 2019-07-31 DIAGNOSIS — E1143 Type 2 diabetes mellitus with diabetic autonomic (poly)neuropathy: Secondary | ICD-10-CM | POA: Diagnosis not present

## 2019-07-31 DIAGNOSIS — E785 Hyperlipidemia, unspecified: Secondary | ICD-10-CM | POA: Diagnosis not present

## 2019-07-31 DIAGNOSIS — Z8673 Personal history of transient ischemic attack (TIA), and cerebral infarction without residual deficits: Secondary | ICD-10-CM | POA: Diagnosis not present

## 2019-07-31 DIAGNOSIS — N1832 Chronic kidney disease, stage 3b: Secondary | ICD-10-CM | POA: Diagnosis not present

## 2019-07-31 DIAGNOSIS — I10 Essential (primary) hypertension: Secondary | ICD-10-CM

## 2019-07-31 DIAGNOSIS — E1169 Type 2 diabetes mellitus with other specified complication: Secondary | ICD-10-CM | POA: Diagnosis not present

## 2019-07-31 DIAGNOSIS — D469 Myelodysplastic syndrome, unspecified: Secondary | ICD-10-CM | POA: Diagnosis not present

## 2019-07-31 DIAGNOSIS — H6123 Impacted cerumen, bilateral: Secondary | ICD-10-CM | POA: Diagnosis not present

## 2019-07-31 DIAGNOSIS — H6120 Impacted cerumen, unspecified ear: Secondary | ICD-10-CM | POA: Insufficient documentation

## 2019-07-31 LAB — CBC WITH DIFFERENTIAL/PLATELET
Basophils Absolute: 0.1 10*3/uL (ref 0.0–0.1)
Basophils Relative: 1.2 % (ref 0.0–3.0)
Eosinophils Absolute: 0.2 10*3/uL (ref 0.0–0.7)
Eosinophils Relative: 4.3 % (ref 0.0–5.0)
HCT: 32.6 % — ABNORMAL LOW (ref 36.0–46.0)
Hemoglobin: 10.8 g/dL — ABNORMAL LOW (ref 12.0–15.0)
Lymphocytes Relative: 37.4 % (ref 12.0–46.0)
Lymphs Abs: 2 10*3/uL (ref 0.7–4.0)
MCHC: 33.1 g/dL (ref 30.0–36.0)
MCV: 101.1 fl — ABNORMAL HIGH (ref 78.0–100.0)
Monocytes Absolute: 0.5 10*3/uL (ref 0.1–1.0)
Monocytes Relative: 9 % (ref 3.0–12.0)
Neutro Abs: 2.6 10*3/uL (ref 1.4–7.7)
Neutrophils Relative %: 48.1 % (ref 43.0–77.0)
Platelets: 108 10*3/uL — ABNORMAL LOW (ref 150.0–400.0)
RBC: 3.22 Mil/uL — ABNORMAL LOW (ref 3.87–5.11)
RDW: 16.5 % — ABNORMAL HIGH (ref 11.5–15.5)
WBC: 5.3 10*3/uL (ref 4.0–10.5)

## 2019-07-31 LAB — COMPREHENSIVE METABOLIC PANEL
ALT: 19 U/L (ref 0–35)
AST: 38 U/L — ABNORMAL HIGH (ref 0–37)
Albumin: 3.1 g/dL — ABNORMAL LOW (ref 3.5–5.2)
Alkaline Phosphatase: 64 U/L (ref 39–117)
BUN: 21 mg/dL (ref 6–23)
CO2: 25 mEq/L (ref 19–32)
Calcium: 9.7 mg/dL (ref 8.4–10.5)
Chloride: 109 mEq/L (ref 96–112)
Creatinine, Ser: 1.28 mg/dL — ABNORMAL HIGH (ref 0.40–1.20)
GFR: 39.33 mL/min — ABNORMAL LOW (ref >60.00)
Glucose, Bld: 212 mg/dL — ABNORMAL HIGH (ref 70–99)
Potassium: 4.2 mEq/L (ref 3.5–5.1)
Sodium: 141 mEq/L (ref 135–145)
Total Bilirubin: 0.8 mg/dL (ref 0.2–1.2)
Total Protein: 6.6 g/dL (ref 6.0–8.3)

## 2019-07-31 LAB — LIPID PANEL
Cholesterol: 132 mg/dL (ref 0–200)
HDL: 28 mg/dL — ABNORMAL LOW (ref >39.00)
LDL Cholesterol: 81 mg/dL (ref 0–99)
NonHDL: 104.15
Total CHOL/HDL Ratio: 5
Triglycerides: 114 mg/dL (ref 0.0–149.0)
VLDL: 22.8 mg/dL (ref 0.0–40.0)

## 2019-07-31 LAB — HEMOGLOBIN A1C: Hgb A1c MFr Bld: 6.4 % (ref 4.6–6.5)

## 2019-07-31 MED ORDER — LOSARTAN POTASSIUM 50 MG PO TABS: 50.0000 mg | ORAL_TABLET | Freq: Every day | ORAL | 0 refills | Status: DC

## 2019-07-31 NOTE — Assessment & Plan Note (Signed)
Renal panel today  Recent uti was tx Drinking fluids Off metformin   Did re start losartan for bp today

## 2019-07-31 NOTE — Assessment & Plan Note (Signed)
BP: (!) 160/86    Has been elevated Will re start the losartan 50 mg that was stopped with last hosp No more stroke activity Doing well  Family will update with home bp/ if not improving in the coming weeks  Lab today

## 2019-07-31 NOTE — Assessment & Plan Note (Signed)
At home done with PT Still doing exercises One fall  Disc fall prev  Overall doing quite well

## 2019-07-31 NOTE — Progress Notes (Signed)
Subjective:    Patient ID: Stacey Harris, female    DOB: 07-18-1928, 86 y.o.   MRN: [id number]  This visit occurred during the SARS-CoV-2 public health emergency.  Safety protocols were in place, including screening questions prior to the visit, additional usage of staff PPE, and extensive cleaning of exam room while observing appropriate contact time as indicated for disinfecting solutions.    HPI Pt presents for 3 mo f/u of chronic medical problems  Had CVA in the fall  L sided hemiparesis after R basal ganglia infarct   Had one fall when she turned to quickly turned with her walker  Bruised up but ok   Had her first covid vaccine -did well with it    Is at home  Does crossword puzzles regularly  Also some reading - 4-5 books recently  PT is over- still doing her exercises  Some days are better than others   Ears are stopped up  Also tinnitus  Wears hearing aides on and off   No new urinary symptoms   Wt Readings from Last 3 Encounters:  07/31/19 165 lb 1 oz (74.9 kg)  05/25/19 167 lb (75.8 kg)  04/29/19 164 lb 8 oz (74.6 kg)  she eats fine/ appetite is not a problem  Family cooks for her  30.19 kg/m   bp is stable today  No cp or palpitations or headaches or edema  No side effects to medicines  BP Readings from Last 3 Encounters:  07/31/19 (!) 160/86  05/25/19 (!) 162/61  04/29/19 126/68    Was previously on losartan- held after cva  She did not sleep well last night    Lab Results  Component Value Date   CREATININE 1.21 (H) 04/29/2019   BUN 25 (H) 04/29/2019   NA 140 04/29/2019   K 3.7 04/29/2019   CL 106 04/29/2019   CO2 25 04/29/2019    DM2 Lab Results  Component Value Date   HGBA1C 8.5 (H) 03/31/2019   Off metformin due to renal insuff  Blood glucose usually 90-115 in the am / does not check later often (but usually not much higher)  Taking glipiride  Due for check    Hyperlipidemia  Lab Results  Component Value Date   CHOL 92  03/31/2019   HDL 20 (L) 03/31/2019   LDLCALC 55 03/31/2019   LDLDIRECT 202.7 08/17/2010   TRIG 84 03/31/2019   CHOLHDL 4.6 03/31/2019   Well controlled with crestor in setting of CVA  H/o MDS Lab Results  Component Value Date   WBC 5.9 04/29/2019   HGB 10.5 (L) 04/29/2019   HCT 31.6 (L) 04/29/2019   MCV 102.0 (H) 04/29/2019   PLT 98.0 (L) 04/29/2019   obs for platelet ct under 50  (if so needs to hold her asa)   Patient Active Problem List   Diagnosis Date Noted  . Cerumen impaction 07/31/2019  . Iron deficiency 04/29/2019  . Carpal tunnel syndrome of left wrist   . Myelodysplastic syndrome ([place])   . H/O: CVA (cerebrovascular accident)   . Hypomagnesemia   . CKD (chronic kidney disease) 03/30/2019  . Recurrent UTI (urinary tract infection) 09/17/2018  . Fall at home 09/17/2018  . Frequent urination 04/29/2018  . Elevated serum creatinine 04/11/2018  . Dysuria 11/12/2017  . SOB (shortness of breath) on exertion 11/12/2017  . History of fall 08/27/2016  . Controlled type 2 diabetes mellitus with diabetic autonomic neuropathy ([place]) 02/24/2016  . Routine general medical examination  at a health care facility 02/12/2016  . MDS (myelodysplastic syndrome) ([place]) 10/24/2015  . Normocytic anemia 02/11/2015  . GERD (gastroesophageal reflux disease) 08/06/2014  . Encounter for Medicare annual wellness exam 08/03/2013  . Thrombocytopenia ([place]) 08/03/2013  . Insomnia 01/26/2013  . Hypothyroid 09/17/2011  . Lumbar spinal stenosis 06/21/2011    Class: Present on Admission  . BACK PAIN, LUMBAR 08/23/2010  . POSTNASAL DRIP 08/23/2010  . OSTEOARTHRITIS, HIP 03/16/2009  . BASAL CELL CARCINOMA, FACE 07/01/2007  . BASAL CELL CARCINOMA, FACE 07/01/2007  . DIABETIC PERIPHERAL NEUROPATHY 09/24/2006  . Hyperlipidemia associated with type 2 diabetes mellitus ([place]) 09/24/2006  . Essential hypertension 09/24/2006  . ROSACEA 09/24/2006  . INCONTINENCE, URGE 09/24/2006  . COLONOSCOPY AND  REMOVAL OF LESION, HX OF 06/09/2003   Past Medical History:  Diagnosis Date  . Blood transfusion   . Cancer ([place])    basal cell skin CA  . Diabetes mellitus    type II  . Frequent UTI   . Hyperlipidemia   . Hypertension   . Hypothyroidism   . Macular degeneration of both eyes   . OA (osteoarthritis)   . Stroke [facility]) 03/30/2019   Past Surgical History:  Procedure Laterality Date  . ANKLE FRACTURE SURGERY  10   rt  . CARPAL TUNNEL RELEASE Right 06/06/2017   Procedure: RIGHT CARPAL TUNNEL RELEASE;  Surgeon: Daryll Brod, MD;  Location: [place];  Service: Orthopedics;  Laterality: Right;  . CATARACT EXTRACTION W/ INTRAOCULAR LENS  IMPLANT, BILATERAL Bilateral 2019  . JOINT REPLACEMENT Right 2010   rt total hip 10  . LUMBAR LAMINECTOMY/DECOMPRESSION MICRODISCECTOMY  06/22/2011   Procedure: LUMBAR LAMINECTOMY/DECOMPRESSION MICRODISCECTOMY;  Surgeon: Marybelle Killings;  Location: [place];  Service: Orthopedics;  Laterality: N/A;  L3-4, L4-5 Decompression  . MOHS SURGERY  12/08   basal cell skin cancer lt temple  . TAH      and BSO, age 15  . TONSILLECTOMY     Social History   Tobacco Use  . Smoking status: Never Smoker  . Smokeless tobacco: Never Used  Substance Use Topics  . Alcohol use: No    Alcohol/week: 0.0 standard drinks  . Drug use: No   Family History  Problem Relation Age of Onset  . Arthritis Father        RA  . Hypertension Father   . Heart disease Father        CAD  . Diabetes Father   . Arthritis Sister        RA  . Diabetes Brother   . Cancer Brother   . Diabetes Brother   . Cancer Brother   . Diabetes Brother   . Cancer Brother   . Diabetes Sister   . Cancer Brother    Allergies  Allergen Reactions  . Keflex [Cephalexin]     Did ok with lower dose, higher dose caused a rash    . Mavik [Trandolapril]     Cough    Current Outpatient Medications on File Prior to Visit  Medication Sig Dispense Refill  . acetaminophen (TYLENOL)  325 MG tablet Take 1-2 tablets (325-650 mg total) by mouth every 4 (four) hours as needed for mild pain.    [name] aspirin EC 81 MG EC tablet Take 1 tablet (81 mg total) by mouth daily.    . Blood Glucose Calibration (ACCU-CHEK SMARTVIEW CONTROL) LIQD     . Blood Glucose Monitoring Suppl (ACCU-CHEK AVIVA PLUS) w/Device KIT Check blood sugar once daily  and as directed.Dx E11.43 1 kit 0  . Cholecalciferol (VITAMIN D PO) Take 5,000 Units by mouth daily.    [name] CRANBERRY PO Take 2 tablets by mouth daily.     [name] glimepiride (AMARYL) 4 MG tablet TAKE 1 TABLET EVERY DAY BEFORE BREAKFAST 90 tablet 0  . glucose blood (TRUE METRIX BLOOD GLUCOSE TEST) test strip Check blood sugar once daily and as directed.Dx E11.43 100 each 1  . levothyroxine (SYNTHROID) 50 MCG tablet Take 1 tablet (50 mcg total) by mouth daily. 90 tablet 0  . Multiple Vitamin (MULTIVITAMIN) capsule Take 1 capsule by mouth daily.    . Omega-3 Fatty Acids (FISH OIL) 1200 MG CAPS Take 1,200 mg by mouth daily.     . rosuvastatin (CRESTOR) 10 MG tablet Take 1 tablet (10 mg total) by mouth daily. 90 tablet 3  . True Comfort Twist Top Lancets MISC 1 each by Subdermal route daily. Check blood sugar once daily and as directed.Dx E11.43 100 each 1   No current facility-administered medications on file prior to visit.    Review of Systems  Constitutional: Negative for chills, fever and malaise/fatigue.  HENT: Negative for congestion, ear pain, sinus pain and sore throat.   Eyes: Negative for blurred vision, discharge and redness.  Respiratory: Negative for cough, shortness of breath and stridor.   Cardiovascular: Negative for chest pain, palpitations and leg swelling.  Gastrointestinal: Negative for abdominal pain, diarrhea, nausea and vomiting.  Genitourinary: Positive for frequency. Negative for dysuria.       Overactive bladder  Musculoskeletal: Negative for myalgias and neck pain.  Skin: Negative for rash.  Neurological: Positive for focal  weakness and weakness. Negative for dizziness, sensory change, loss of consciousness and headaches.       Poor balance-uses walker Doing well s/p CVA   Psychiatric/Behavioral: Negative for depression. The patient is not nervous/anxious.    Review of Systems  Constitutional: Negative for chills, fever and malaise/fatigue.  HENT: Negative for congestion, ear pain, sinus pain and sore throat.   Eyes: Negative for blurred vision, discharge and redness.  Respiratory: Negative for cough, shortness of breath and stridor.   Cardiovascular: Negative for chest pain, palpitations and leg swelling.  Gastrointestinal: Negative for abdominal pain, diarrhea, nausea and vomiting.  Genitourinary: Positive for frequency. Negative for dysuria.       Overactive bladder  Musculoskeletal: Negative for myalgias and neck pain.  Skin: Negative for rash.  Neurological: Positive for focal weakness and weakness. Negative for dizziness, sensory change, loss of consciousness and headaches.       Poor balance-uses walker Doing well s/p CVA   Psychiatric/Behavioral: Negative for depression. The patient is not nervous/anxious.        Objective:   Physical Exam Constitutional:      General: She is not in acute distress.    Appearance: Normal appearance. She is well-developed. She is obese. She is not ill-appearing or diaphoretic.  HENT:     Head: Normocephalic and atraumatic.     Right Ear: External ear normal. There is impacted cerumen.     Left Ear: External ear normal. There is impacted cerumen.     Ears:     Comments: Partial cerumen obst bilat- dry    Mouth/Throat:     Mouth: Mucous membranes are moist.  Eyes:     General: No scleral icterus.    Conjunctiva/sclera: Conjunctivae normal.     Pupils: Pupils are equal, round, and reactive to light.  Neck:  Thyroid: No thyromegaly.     Vascular: No carotid bruit or JVD.  Cardiovascular:     Rate and Rhythm: Normal rate and regular rhythm.     Heart  sounds: Normal heart sounds. No gallop.   Pulmonary:     Effort: Pulmonary effort is normal. No respiratory distress.     Breath sounds: Normal breath sounds. No wheezing or rales.  Abdominal:     General: Bowel sounds are normal. There is no distension or abdominal bruit.     Palpations: Abdomen is soft. There is no mass.     Tenderness: There is no abdominal tenderness.  Musculoskeletal:     Cervical back: Normal range of motion and neck supple. No tenderness.     Comments: Mild lymphedema of ankles with sock line No pitting    Lymphadenopathy:     Cervical: No cervical adenopathy.  Skin:    General: Skin is warm and dry.     Coloration: Skin is not pale.     Findings: No erythema or rash.  Neurological:     Mental Status: She is alert.     Deep Tendon Reflexes: Reflexes are normal and symmetric.     Comments: ambu           Assessment & Plan:   Problem List Items Addressed This Visit      Cardiovascular and Mediastinum   Essential hypertension - Primary    BP: (!) 160/86    Has been elevated Will re start the losartan 50 mg that was stopped with last hosp No more stroke activity Doing well  Family will update with home bp/ if not improving in the coming weeks  Lab today      Relevant Medications   losartan (COZAAR) 50 MG tablet   Other Relevant Orders   CBC with Differential/Platelet (Completed)   Comprehensive metabolic panel (Completed)   Lipid panel (Completed)     Endocrine   Hyperlipidemia associated with type 2 diabetes mellitus ([place])    Disc goals for lipids and reasons to control them Rev last labs with pt Rev low sat fat diet in detail  Good control with crestor and diet  LDL 55       Relevant Medications   losartan (COZAAR) 50 MG tablet   Other Relevant Orders   Lipid panel (Completed)   Controlled type 2 diabetes mellitus with diabetic autonomic neuropathy ([facility])    Very good glucose readings at home-mostly am  Enc to check at  different times  /a1/c today Enc low glycemic diet (appetite is good) On arb and statin       Relevant Medications   losartan (COZAAR) 50 MG tablet   Other Relevant Orders   Hemoglobin A1c (Completed)     Nervous and Auditory   Cerumen impaction    Mild Pt will use peroxide at home to loosen it  We can irrigate later if needed        Genitourinary   CKD (chronic kidney disease)    Renal panel today  Recent uti was tx Drinking fluids Off metformin   Did re start losartan for bp today        Other   MDS (myelodysplastic syndrome) ([facility])    Watching Hb and platelets  Need to stop asa for platelet cut under 50  Lab today      H/O: CVA (cerebrovascular accident)    At home done with PT Still doing exercises One fall  Disc fall prev  Overall doing quite well

## 2019-07-31 NOTE — Assessment & Plan Note (Signed)
Mild Pt will use peroxide at home to loosen it  We can irrigate later if needed

## 2019-07-31 NOTE — Assessment & Plan Note (Signed)
Very good glucose readings at home-mostly am  Enc to check at different times  /a1/c today Enc low glycemic diet (appetite is good) On arb and statin

## 2019-07-31 NOTE — Assessment & Plan Note (Signed)
Watching Hb and platelets  Need to stop asa for platelet cut under 50  Lab today

## 2019-07-31 NOTE — Patient Instructions (Addendum)
Use peroxide to work on Art therapist   Start back on losartan for blood pressure  50 mg daily  If blood pressure does not improve (under 140/90) in the next month let me know  Keep drinking water for kidney health   Labs today   Will make a plan based on those results   Keep doing your therapy exercises and puzzles

## 2019-07-31 NOTE — Assessment & Plan Note (Signed)
Disc goals for lipids and reasons to control them Rev last labs with pt Rev low sat fat diet in detail  Good control with crestor and diet  LDL 55

## 2019-08-03 ENCOUNTER — Encounter: Payer: Self-pay | Admitting: *Deleted

## 2019-09-21 ENCOUNTER — Telehealth: Payer: Self-pay | Admitting: Family Medicine

## 2019-09-21 NOTE — Progress Notes (Signed)
Patient daughter, Lidia Collum scheduled a call back. She needed to check her calendar she is her mother's care giver.         Raynicia Dukes [employer] Scheduler

## 2019-10-08 ENCOUNTER — Other Ambulatory Visit: Payer: Self-pay | Admitting: Family Medicine

## 2019-10-20 ENCOUNTER — Telehealth: Payer: Self-pay | Admitting: *Deleted

## 2019-10-20 DIAGNOSIS — E1143 Type 2 diabetes mellitus with diabetic autonomic (poly)neuropathy: Secondary | ICD-10-CM

## 2019-10-20 NOTE — Telephone Encounter (Signed)
Order done and printed as DME Please fax to the pharmacy

## 2019-10-20 NOTE — Telephone Encounter (Signed)
Received fax from [address] saying that pt's insurance will not cover [employer] (DM supplies). They only cover accu-chek, they are requesting a new Rx sent in for the meter, strips and lancets, please advise

## 2019-10-20 NOTE — Telephone Encounter (Signed)
Order faxed.

## 2019-10-21 ENCOUNTER — Other Ambulatory Visit: Payer: Self-pay | Admitting: Family Medicine

## 2019-11-04 ENCOUNTER — Other Ambulatory Visit: Payer: Self-pay | Admitting: Family Medicine

## 2019-11-06 ENCOUNTER — Other Ambulatory Visit: Payer: Self-pay | Admitting: Family Medicine

## 2020-01-28 ENCOUNTER — Other Ambulatory Visit: Payer: Self-pay | Admitting: Family Medicine

## 2020-04-05 ENCOUNTER — Encounter: Payer: Self-pay | Admitting: Family Medicine

## 2020-04-05 ENCOUNTER — Telehealth: Payer: Self-pay

## 2020-04-05 ENCOUNTER — Ambulatory Visit (INDEPENDENT_AMBULATORY_CARE_PROVIDER_SITE_OTHER): Payer: Medicare HMO | Admitting: Family Medicine

## 2020-04-05 ENCOUNTER — Other Ambulatory Visit: Payer: Self-pay | Admitting: Family Medicine

## 2020-04-05 ENCOUNTER — Other Ambulatory Visit: Payer: Self-pay

## 2020-04-05 ENCOUNTER — Ambulatory Visit (INDEPENDENT_AMBULATORY_CARE_PROVIDER_SITE_OTHER)
Admission: RE | Admit: 2020-04-05 | Discharge: 2020-04-05 | Disposition: A | Discharge status: Home / Self Care | Payer: Medicare HMO | Source: Ambulatory Visit | Attending: Family Medicine | Admitting: Family Medicine

## 2020-04-05 VITALS — BP 138/68 | HR 68 | Temp 97.7°F | Ht 62.0 in | Wt 161.5 lb

## 2020-04-05 DIAGNOSIS — R053 Chronic cough: Secondary | ICD-10-CM

## 2020-04-05 DIAGNOSIS — R05 Cough: Secondary | ICD-10-CM

## 2020-04-05 DIAGNOSIS — R0600 Dyspnea, unspecified: Secondary | ICD-10-CM | POA: Diagnosis not present

## 2020-04-05 DIAGNOSIS — I10 Essential (primary) hypertension: Secondary | ICD-10-CM

## 2020-04-05 DIAGNOSIS — K449 Diaphragmatic hernia without obstruction or gangrene: Secondary | ICD-10-CM | POA: Diagnosis not present

## 2020-04-05 DIAGNOSIS — I5032 Chronic diastolic (congestive) heart failure: Secondary | ICD-10-CM

## 2020-04-05 DIAGNOSIS — R062 Wheezing: Secondary | ICD-10-CM | POA: Diagnosis not present

## 2020-04-05 DIAGNOSIS — Z23 Encounter for immunization: Secondary | ICD-10-CM | POA: Diagnosis not present

## 2020-04-05 DIAGNOSIS — I509 Heart failure, unspecified: Secondary | ICD-10-CM | POA: Diagnosis not present

## 2020-04-05 DIAGNOSIS — R0609 Other forms of dyspnea: Secondary | ICD-10-CM

## 2020-04-05 LAB — CBC
HCT: 33.2 % — ABNORMAL LOW (ref 36.0–46.0)
Hemoglobin: 11 g/dL — ABNORMAL LOW (ref 12.0–15.0)
MCHC: 33.2 g/dL (ref 30.0–36.0)
MCV: 102.5 fl — ABNORMAL HIGH (ref 78.0–100.0)
Platelets: 121 10*3/uL — ABNORMAL LOW (ref 150.0–400.0)
RBC: 3.24 Mil/uL — ABNORMAL LOW (ref 3.87–5.11)
RDW: 16.4 % — ABNORMAL HIGH (ref 11.5–15.5)
WBC: 6.3 10*3/uL (ref 4.0–10.5)

## 2020-04-05 LAB — COMPREHENSIVE METABOLIC PANEL
ALT: 21 U/L (ref 0–35)
AST: 37 U/L (ref 0–37)
Albumin: 2.8 g/dL — ABNORMAL LOW (ref 3.5–5.2)
Alkaline Phosphatase: 74 U/L (ref 39–117)
BUN: 24 mg/dL — ABNORMAL HIGH (ref 6–23)
CO2: 27 mEq/L (ref 19–32)
Calcium: 9.1 mg/dL (ref 8.4–10.5)
Chloride: 108 mEq/L (ref 96–112)
Creatinine, Ser: 1.44 mg/dL — ABNORMAL HIGH (ref 0.40–1.20)
GFR: 34.28 mL/min — ABNORMAL LOW (ref >60.00)
Glucose, Bld: 230 mg/dL — ABNORMAL HIGH (ref 70–99)
Potassium: 4.3 mEq/L (ref 3.5–5.1)
Sodium: 141 mEq/L (ref 135–145)
Total Bilirubin: 0.9 mg/dL (ref 0.2–1.2)
Total Protein: 6.5 g/dL (ref 6.0–8.3)

## 2020-04-05 LAB — BRAIN NATRIURETIC PEPTIDE: Pro B Natriuretic peptide (BNP): 174 pg/mL — ABNORMAL HIGH (ref 0.0–100.0)

## 2020-04-05 LAB — TSH: TSH: 2.95 u[IU]/mL (ref 0.35–4.50)

## 2020-04-05 MED ORDER — FUROSEMIDE 20 MG PO TABS: 20.0000 mg | ORAL_TABLET | Freq: Every day | ORAL | 1 refills | Status: DC

## 2020-04-05 NOTE — Patient Instructions (Signed)
Pick up allergy medication - claritin, zyrtec, allegra - store brand OK  Could try flonase   Call if you want to try albuterol inhaler  Labs and X-ray today

## 2020-04-05 NOTE — Assessment & Plan Note (Addendum)
Pt with progressive DOE w/o significant edema. Reviewed Echo from 2020 with normal EF. No crackles on exam but wheezing. CXR with interstitial process which could be CHF or pneumonitis. Previously saw cardiology and was instructed to do conditioning. Review of chart is long hx of DOE though reported these are newer symptoms. Will follow-up blood work and if normal - may recommend returning to cardiology or consideration for low dose lasix. She will also try allergy medication treatment in the meanttime due to some worsening with cat exposure. Could also consider inhaler trial given wheezing on exam

## 2020-04-05 NOTE — Telephone Encounter (Signed)
[facility] - Client TELEPHONE ADVICE RECORD [facility] Patient Name: Stacey Harris Gender: Female DOB: March 29, 1929 Age: 86 Y 10 M 28 D Return Phone Number: [phone] (Primary) Address: City/State/Zip: [place] [address] Client [facility] - Client Client Site [facility], Roque Lias - MD Contact Type Call Who Is Calling Patient / Member / Family / Caregiver Call Type Triage / Clinical Caller Name Lidia Collum Relationship To Patient Daughter Return Phone Number [phone] (Primary) Chief Complaint BREATHING - shortness of breath or sounds breathless Reason for Call Symptomatic / Request for [name] states that her mother has chronic bronchitis, shortness of breath and a rattling in her chest. Translation No Nurse Assessment Nurse: Wynetta Emery, RN, Baker Janus Date/Time (Eastern Time): 04/05/2020 8:50:39 AM Confirm and document reason for call. If symptomatic, describe symptoms. ---[name] has a cough for 2 to 3 months not getting better and now has shortness of breath and rattling in chest and coughing (only with exertion) no fever with this Does the patient have any new or worsening symptoms? ---Yes Will a triage be completed? ---Yes Related visit to physician within the last 2 weeks? ---No Does the PT have any chronic conditions? (i.e. diabetes, asthma, this includes High risk factors for pregnancy, etc.) ---Yes List chronic conditions. ---multiple health issues Is this a behavioral health or substance abuse call? ---No Guidelines Guideline Title Affirmed Question Affirmed Notes Nurse Date/Time (Eastern Time) Cough - Chronic [1] MILD difficulty breathing (e.g., minimal/ no SOB at rest, SOB with walking, pulse <100) AND [2] still present when not coughing (Exception: no change from usual, chronic shortness of breath) [name] 04/05/2020  8:53:38 AMPLEASE NOTE: All timestamps contained within this report are represented as [place] Standard Time. CONFIDENTIALTY NOTICE: This fax transmission is intended only for the addressee. It contains information that is legally privileged, confidential or otherwise protected from use or disclosure. If you are not the intended recipient, you are strictly prohibited from reviewing, disclosing, copying using or disseminating any of this information or taking any action in reliance on or regarding this information. If you have received this fax in error, please notify us immediately by telephone so that we can arrange for its return to [place]. Phone: [phone], Toll-Free: [phone], Fax: [phone] Page: 2 of 2 Call Id: [id number] [place]. Time Eilene Ghazi Time) Disposition Final User 04/05/2020 8:49:40 AM Send to Urgent Queue Windy Canny 04/05/2020 8:56:36 AM See HCP within 4 Hours (or PCP triage) Yes Wynetta Emery, RN, Christin Bach Disagree/Comply Comply Caller Understands Yes PreDisposition Call Doctor Care Advice Given Per Guideline SEE HCP (OR PCP TRIAGE) WITHIN 4 HOURS: * You become worse CARE ADVICE given per Cough - Chronic (Adult) guideline. Referrals REFERRED TO PCP OFFICE

## 2020-04-05 NOTE — Progress Notes (Signed)
Subjective:     Stacey Harris is a 86 y.o. female presenting for Bronchitis (chronic ), Shortness of Breath, and Cough     Shortness of Breath Associated symptoms include leg swelling (chronic, not worse) and rhinorrhea. Pertinent negatives include no fever, sore throat or vomiting. There is no history of asthma or COPD.  Cough This is a new problem. The current episode started more than 1 month ago. The problem has been waxing and waning. The problem occurs constantly. The cough is productive of sputum. Associated symptoms include postnasal drip, rhinorrhea and shortness of breath (with movement). Pertinent negatives include no chills, fever or sore throat. The symptoms are aggravated by animals (walking). She has tried OTC cough suppressant for the symptoms. Her past medical history is significant for environmental allergies. There is no history of asthma or COPD.    Progressively worsening SOB with activity  Review of Systems  Constitutional: Negative for chills and fever.  HENT: Positive for postnasal drip and rhinorrhea. Negative for congestion, sinus pressure, sinus pain, sneezing and sore throat.   Eyes: Positive for itching.  Respiratory: Positive for cough and shortness of breath (with movement).   Cardiovascular: Positive for leg swelling (chronic, not worse).  Gastrointestinal: Negative for diarrhea, nausea and vomiting.  Allergic/Immunologic: Positive for environmental allergies.   01/2018: Saw cardiology for DOE and CXR with CHF signs - echo done w/o significant disease and advised conditioning.   Social History   Tobacco Use  Smoking Status Never Smoker  Smokeless Tobacco Never Used        Objective:    BP Readings from Last 3 Encounters:  04/05/20 138/68  07/31/19 (!) 160/86  05/25/19 (!) 162/61   Wt Readings from Last 3 Encounters:  04/05/20 161 lb 8 oz (73.3 kg)  07/31/19 165 lb 1 oz (74.9 kg)  05/25/19 167 lb (75.8 kg)    BP 138/68   Pulse 68    Temp 97.7 F (36.5 C) (Temporal)   Ht 5\' 2"  (1.575 m)   Wt 161 lb 8 oz (73.3 kg)   SpO2 92% Comment: 89% with walking  BMI 29.54 kg/m    Physical Exam Constitutional:      General: She is not in acute distress.    Appearance: She is well-developed. She is not diaphoretic.  HENT:     Head: Normocephalic and atraumatic.     Right Ear: External ear normal.     Left Ear: External ear normal.     Nose: Nose normal.  Eyes:     Conjunctiva/sclera: Conjunctivae normal.  Cardiovascular:     Rate and Rhythm: Normal rate and regular rhythm.     Heart sounds: Murmur heard.   Pulmonary:     Effort: Pulmonary effort is normal. No accessory muscle usage.     Breath sounds: Examination of the right-lower field reveals decreased breath sounds and wheezing. Decreased breath sounds and wheezing present. No rhonchi or rales.  Musculoskeletal:     Cervical back: Neck supple.     Right lower leg: Edema (trace) present.     Left lower leg: Edema (trace) present.  Skin:    General: Skin is warm and dry.     Capillary Refill: Capillary refill takes less than 2 seconds.  Neurological:     Mental Status: She is alert. Mental status is at baseline.  Psychiatric:        Mood and Affect: Mood normal.        Behavior: Behavior normal.  DG Chest 2 View CLINICAL DATA:  Wheezing.  EXAM: CHEST - 2 VIEW  COMPARISON:  11/12/2017.  04/06/2009.  FINDINGS: Heart size stable. Diffuse bilateral interstitial prominence again noted. Findings consistent with an active interstitial process such as CHF and or pneumonitis. No pleural effusion or pneumothorax. Probable sliding hiatal hernia.  IMPRESSION: Diffuse bilateral interstitial prominence. Findings consistent with an active interstitial process such as CHF and or pneumonitis. Similar finding was noted on prior study of 11/12/2017.  Electronically Signed   By: Marcello Moores  Register   On: 04/05/2020 12:23   ECHO 03/30/2019: normal EF, dilated left  atrium      Assessment & Plan:   Problem List Items Addressed This Visit      Cardiovascular and Mediastinum   Essential hypertension     Other   Dyspnea on exertion - Primary    Pt with progressive DOE w/o significant edema. Reviewed Echo from 2020 with normal EF. No crackles on exam but wheezing. CXR with interstitial process which could be CHF or pneumonitis. Previously saw cardiology and was instructed to do conditioning. Review of chart is long hx of DOE though reported these are newer symptoms. Will follow-up blood work and if normal - may recommend returning to cardiology or consideration for low dose lasix. She will also try allergy medication treatment in the meanttime due to some worsening with cat exposure. Could also consider inhaler trial given wheezing on exam       Relevant Orders   DG Chest 2 View (Completed)   Comprehensive metabolic panel   TSH   CBC   Brain natriuretic peptide    Other Visit Diagnoses    Need for influenza vaccination       Chronic cough       Relevant Orders   DG Chest 2 View (Completed)       Return if symptoms worsen or fail to improve.  Lesleigh Noe, MD  This visit occurred during the SARS-CoV-2 public health emergency.  Safety protocols were in place, including screening questions prior to the visit, additional usage of staff PPE, and extensive cleaning of exam room while observing appropriate contact time as indicated for disinfecting solutions.

## 2020-04-05 NOTE — Telephone Encounter (Signed)
Access nurse contacted the office and pt was placed on Dr. Einar Pheasant schedule for OV. Dr. Verda Cumins asked this nurse to contact pt to triage before apt.   Contacted pt's daughter, [name]. She reports pt has nonproductive cough for 2-3 with exertion and SOB. She reports it sounds like chronic bronchitis. Pt has hx of of anemia. [name] reports pt SOB has gotten worse over last 3 months since pt staying with sister who has a cat. Pt does not have any other symptoms.   Discussed with Dr. Einar Pheasant and she confirmed it is ok for pt to come in office.   Dewayne Hatch to bring pt in for OV. [name] verbalized understanding.

## 2020-04-06 ENCOUNTER — Telehealth: Payer: Self-pay | Admitting: Family Medicine

## 2020-04-06 ENCOUNTER — Other Ambulatory Visit: Payer: Self-pay | Admitting: Family Medicine

## 2020-04-06 DIAGNOSIS — N1832 Chronic kidney disease, stage 3b: Secondary | ICD-10-CM

## 2020-04-06 DIAGNOSIS — I10 Essential (primary) hypertension: Secondary | ICD-10-CM

## 2020-04-06 NOTE — Telephone Encounter (Signed)
Please schedule labs for tomorrow or Monday (I prefer tomorrow) since she recently started lasix Then a f/u with me next week for CHF/ dyspnea  Thanks

## 2020-04-06 NOTE — Telephone Encounter (Signed)
Spoke with daughter and advise her of Dr. Marliss Coots comments. Lab appt scheduled for Friday (daughter could only bring her in that day), and f/u appt scheduled with PCP on 04/12/20

## 2020-04-08 ENCOUNTER — Other Ambulatory Visit (INDEPENDENT_AMBULATORY_CARE_PROVIDER_SITE_OTHER): Payer: Medicare HMO

## 2020-04-08 ENCOUNTER — Other Ambulatory Visit: Payer: Self-pay

## 2020-04-08 DIAGNOSIS — N1832 Chronic kidney disease, stage 3b: Secondary | ICD-10-CM

## 2020-04-08 DIAGNOSIS — I10 Essential (primary) hypertension: Secondary | ICD-10-CM | POA: Diagnosis not present

## 2020-04-08 LAB — BASIC METABOLIC PANEL
BUN: 25 mg/dL — ABNORMAL HIGH (ref 6–23)
CO2: 25 mEq/L (ref 19–32)
Calcium: 9 mg/dL (ref 8.4–10.5)
Chloride: 105 mEq/L (ref 96–112)
Creatinine, Ser: 1.48 mg/dL — ABNORMAL HIGH (ref 0.40–1.20)
GFR: 33.21 mL/min — ABNORMAL LOW (ref >60.00)
Glucose, Bld: 302 mg/dL — ABNORMAL HIGH (ref 70–99)
Potassium: 4.1 mEq/L (ref 3.5–5.1)
Sodium: 138 mEq/L (ref 135–145)

## 2020-04-11 ENCOUNTER — Other Ambulatory Visit: Payer: Self-pay | Admitting: Family Medicine

## 2020-04-12 ENCOUNTER — Encounter: Payer: Self-pay | Admitting: Family Medicine

## 2020-04-12 ENCOUNTER — Ambulatory Visit (INDEPENDENT_AMBULATORY_CARE_PROVIDER_SITE_OTHER): Payer: Medicare HMO | Admitting: Family Medicine

## 2020-04-12 ENCOUNTER — Other Ambulatory Visit: Payer: Self-pay

## 2020-04-12 VITALS — BP 131/70 | HR 63 | Temp 96.9°F | Ht 62.0 in | Wt 159.4 lb

## 2020-04-12 DIAGNOSIS — R0600 Dyspnea, unspecified: Secondary | ICD-10-CM | POA: Diagnosis not present

## 2020-04-12 DIAGNOSIS — I509 Heart failure, unspecified: Secondary | ICD-10-CM

## 2020-04-12 DIAGNOSIS — I10 Essential (primary) hypertension: Secondary | ICD-10-CM

## 2020-04-12 DIAGNOSIS — N1832 Chronic kidney disease, stage 3b: Secondary | ICD-10-CM

## 2020-04-12 DIAGNOSIS — D696 Thrombocytopenia, unspecified: Secondary | ICD-10-CM

## 2020-04-12 DIAGNOSIS — R0609 Other forms of dyspnea: Secondary | ICD-10-CM

## 2020-04-12 MED ORDER — ROSUVASTATIN CALCIUM 10 MG PO TABS
10.0000 mg | ORAL_TABLET | Freq: Every day | ORAL | 3 refills | Status: DC
Start: 2020-04-12 — End: 2020-07-18

## 2020-04-12 MED ORDER — LEVOTHYROXINE SODIUM 50 MCG PO TABS: 50.0000 ug | ORAL_TABLET | Freq: Every day | ORAL | 3 refills | Status: AC

## 2020-04-12 MED ORDER — GLIMEPIRIDE 4 MG PO TABS: ORAL_TABLET | ORAL | 3 refills | Status: AC

## 2020-04-12 NOTE — Assessment & Plan Note (Signed)
This has improved with lasix  Suspect due to fluid overload  Pend cardiology visit

## 2020-04-12 NOTE — Assessment & Plan Note (Signed)
Stable despite lasix initiation  Will continue to follow

## 2020-04-12 NOTE — Patient Instructions (Addendum)
Please do daily weight - keep a record Increase lasix to 40 mg for 2 days  Watch sodium in diet   Update me after those day to let me know how it is going   If you can get a pulse ox - do it   I placed a cardiology referral  The office will call to get that going

## 2020-04-12 NOTE — Assessment & Plan Note (Signed)
bp in fair control at this time  BP Readings from Last 1 Encounters:  04/12/20 131/70   No changes needed Most recent labs reviewed  Disc lifstyle change with low sodium diet and exercise

## 2020-04-12 NOTE — Progress Notes (Signed)
Subjective:    Patient ID: Stacey Harris, female    DOB: 18-Jul-1928, 86 y.o.   MRN: [id number]  This visit occurred during the SARS-CoV-2 public health emergency.  Safety protocols were in place, including screening questions prior to the visit, additional usage of staff PPE, and extensive cleaning of exam room while observing appropriate contact time as indicated for disinfecting solutions.    HPI Pt presents for f/u of CHF  She saw Dr Einar Pheasant on 04/05/20 with c/o of sob   Wt Readings from Last 3 Encounters:  04/12/20 159 lb 7 oz (72.3 kg)  04/05/20 161 lb 8 oz (73.3 kg)  07/31/19 165 lb 1 oz (74.9 kg)   29.16 kg/m  She was started on lasix with elevated BNP Swelling in legs is improved (does elevate also)  Sleeping better Coughing less  ? If prod-family says foamy material   Sob improved initially - today a little more  She did eat a sausage biscuit last night    She c/o pain in L ribs (fell 3 wk ago) Not with breathing   Hands and feet get numb   Some stiffness in L hand-? Trigger finger    BP Readings from Last 3 Encounters:  04/12/20 (!) 142/70  04/05/20 138/68  07/31/19 (!) 160/86   BP: 131/70   Better on 2nd check after sitting   Pulse Readings from Last 3 Encounters:  04/12/20 63  04/05/20 68  07/31/19 69   Lab Results  Component Value Date   HGBA1C 6.4 07/31/2019  has been very well controlled 100-110 at home generally  On amaryl for DM2  Off metformin due to CKD  Cxr:  DG Chest 2 View  Result Date: 04/05/2020 CLINICAL DATA:  Wheezing. EXAM: CHEST - 2 VIEW COMPARISON:  11/12/2017.  04/06/2009. FINDINGS: Heart size stable. Diffuse bilateral interstitial prominence again noted. Findings consistent with an active interstitial process such as CHF and or pneumonitis. No pleural effusion or pneumothorax. Probable sliding hiatal hernia. IMPRESSION: Diffuse bilateral interstitial prominence. Findings consistent with an active interstitial process such as  CHF and or pneumonitis. Similar finding was noted on prior study of 11/12/2017. Electronically Signed   By: Marcello Moores  Register   On: 04/05/2020 12:23   Had echo on 03/30/19 -nl EF and dilated L atrium   Labs done that day: Results for Stacey Harris, Stacey Harris (MRN [id number]) as of 04/12/2020 10:12  Ref. Range 04/05/2020 11:52  COMPREHENSIVE METABOLIC PANEL Unknown Rpt (A)  Sodium Latest Ref Range: 135 - 145 mEq/L 141  Potassium Latest Ref Range: 3.5 - 5.1 mEq/L 4.3  Chloride Latest Ref Range: 96 - 112 mEq/L 108  CO2 Latest Ref Range: 19 - 32 mEq/L 27  Glucose Latest Ref Range: 70 - 99 mg/dL 230 (H)  BUN Latest Ref Range: 6 - 23 mg/dL 24 (H)  Creatinine Latest Ref Range: 0.40 - 1.20 mg/dL 1.44 (H)  Calcium Latest Ref Range: 8.4 - 10.5 mg/dL 9.1  Alkaline Phosphatase Latest Ref Range: 39 - 117 U/L 74  Albumin Latest Ref Range: 3.5 - 5.2 g/dL 2.8 (L)  AST Latest Ref Range: 0 - 37 U/L 37  ALT Latest Ref Range: 0 - 35 U/L 21  Total Protein Latest Ref Range: 6.0 - 8.3 g/dL 6.5  Total Bilirubin Latest Ref Range: 0.2 - 1.2 mg/dL 0.9  GFR Latest Ref Range: >60.00 mL/min 34.28 (L)  Pro B Natriuretic peptide (BNP) Latest Ref Range: 0.0 - 100.0 pg/mL 174.0 (H)  WBC Latest Ref  Range: 4.0 - 10.5 K/uL 6.3  RBC Latest Ref Range: 3.87 - 5.11 Mil/uL 3.24 (L)  Hemoglobin Latest Ref Range: 12.0 - 15.0 g/dL 11.0 (L)  HCT Latest Ref Range: 36 - 46 % 33.2 (L)  MCV Latest Ref Range: 78.0 - 100.0 fl 102.5 (H)  MCHC Latest Ref Range: 30.0 - 36.0 g/dL 33.2  RDW Latest Ref Range: 11.5 - 15.5 % 16.4 (H)  Platelets Latest Ref Range: 150 - 400 K/uL 121.0 (L)   BNP was mildly elevated  She was started on some lasix   Labs after lasix are fairly stable Lab on 04/08/2020  Component Date Value Ref Range Status  . Sodium 04/08/2020 138  135 - 145 mEq/L Final  . Potassium 04/08/2020 4.1  3.5 - 5.1 mEq/L Final  . Chloride 04/08/2020 105  96 - 112 mEq/L Final  . CO2 04/08/2020 25  19 - 32 mEq/L Final  . Glucose, Bld  04/08/2020 302* 70 - 99 mg/dL Final  . BUN 04/08/2020 25* 6 - 23 mg/dL Final  . Creatinine, Ser 04/08/2020 1.48* 0.40 - 1.20 mg/dL Final  . GFR 04/08/2020 33.21* >60.00 mL/min Final  . Calcium 04/08/2020 9.0  8.4 - 10.5 mg/dL Final   Glucose was not fasting  Per family control has been good  Patient Active Problem List   Diagnosis Date Noted  . CHF (congestive heart failure) ([place]) 04/12/2020  . Cerumen impaction 07/31/2019  . Iron deficiency 04/29/2019  . Carpal tunnel syndrome of left wrist   . Myelodysplastic syndrome ([place])   . H/O: CVA (cerebrovascular accident)   . Hypomagnesemia   . CKD (chronic kidney disease) 03/30/2019  . Recurrent UTI (urinary tract infection) 09/17/2018  . Fall at home 09/17/2018  . Frequent urination 04/29/2018  . Elevated serum creatinine 04/11/2018  . Dysuria 11/12/2017  . Dyspnea on exertion 11/12/2017  . History of fall 08/27/2016  . Controlled type 2 diabetes mellitus with diabetic autonomic neuropathy ([place]) 02/24/2016  . Routine general medical examination at a health care facility 02/12/2016  . MDS (myelodysplastic syndrome) ([place]) 10/24/2015  . Normocytic anemia 02/11/2015  . GERD (gastroesophageal reflux disease) 08/06/2014  . Encounter for Medicare annual wellness exam 08/03/2013  . Thrombocytopenia ([place]) 08/03/2013  . Insomnia 01/26/2013  . Hypothyroid 09/17/2011  . Lumbar spinal stenosis 06/21/2011    Class: Present on Admission  . BACK PAIN, LUMBAR 08/23/2010  . POSTNASAL DRIP 08/23/2010  . OSTEOARTHRITIS, HIP 03/16/2009  . BASAL CELL CARCINOMA, FACE 07/01/2007  . BASAL CELL CARCINOMA, FACE 07/01/2007  . DIABETIC PERIPHERAL NEUROPATHY 09/24/2006  . Hyperlipidemia associated with type 2 diabetes mellitus ([place]) 09/24/2006  . Essential hypertension 09/24/2006  . ROSACEA 09/24/2006  . INCONTINENCE, URGE 09/24/2006  . COLONOSCOPY AND REMOVAL OF LESION, HX OF 06/09/2003   Past Medical History:  Diagnosis Date  . Blood transfusion    . Cancer ([place])    basal cell skin CA  . Diabetes mellitus    type II  . Frequent UTI   . Hyperlipidemia   . Hypertension   . Hypothyroidism   . Macular degeneration of both eyes   . OA (osteoarthritis)   . Stroke [facility]) 03/30/2019   Past Surgical History:  Procedure Laterality Date  . ANKLE FRACTURE SURGERY  10   rt  . CARPAL TUNNEL RELEASE Right 06/06/2017   Procedure: RIGHT CARPAL TUNNEL RELEASE;  Surgeon: Daryll Brod, MD;  Location: [facility];  Service: Orthopedics;  Laterality: Right;  . CATARACT EXTRACTION W/ INTRAOCULAR LENS  IMPLANT, BILATERAL Bilateral 2019  . JOINT REPLACEMENT Right 2010   rt total hip 10  . LUMBAR LAMINECTOMY/DECOMPRESSION MICRODISCECTOMY  06/22/2011   Procedure: LUMBAR LAMINECTOMY/DECOMPRESSION MICRODISCECTOMY;  Surgeon: Marybelle Killings;  Location: [facility];  Service: Orthopedics;  Laterality: N/A;  L3-4, L4-5 Decompression  . MOHS SURGERY  12/08   basal cell skin cancer lt temple  . TAH      and BSO, age 58  . TONSILLECTOMY     Social History   Tobacco Use  . Smoking status: Never Smoker  . Smokeless tobacco: Never Used  Vaping Use  . Vaping Use: Never used  Substance Use Topics  . Alcohol use: No    Alcohol/week: 0.0 standard drinks  . Drug use: No   Family History  Problem Relation Age of Onset  . Arthritis Father        RA  . Hypertension Father   . Heart disease Father        CAD  . Diabetes Father   . Arthritis Sister        RA  . Diabetes Brother   . Cancer Brother   . Diabetes Brother   . Cancer Brother   . Diabetes Brother   . Cancer Brother   . Diabetes Sister   . Cancer Brother    Allergies  Allergen Reactions  . Keflex [Cephalexin]     Did ok with lower dose, higher dose caused a rash    . Mavik [Trandolapril]     Cough    Current Outpatient Medications on File Prior to Visit  Medication Sig Dispense Refill  . acetaminophen (TYLENOL) 325 MG tablet Take 1-2 tablets (325-650 mg total) by mouth  every 4 (four) hours as needed for mild pain.    [name] aspirin EC 81 MG EC tablet Take 1 tablet (81 mg total) by mouth daily.    . Blood Glucose Calibration (ACCU-CHEK SMARTVIEW CONTROL) LIQD     . Blood Glucose Monitoring Suppl (ACCU-CHEK AVIVA PLUS) w/Device KIT Check blood sugar once daily and as directed.Dx E11.43 1 kit 0  . Cholecalciferol (VITAMIN D PO) Take 5,000 Units by mouth daily.    [name] CRANBERRY PO Take 2 tablets by mouth daily.     . furosemide (LASIX) 20 MG tablet Take 1 tablet (20 mg total) by mouth daily. 30 tablet 1  . losartan (COZAAR) 25 MG tablet TAKE 2 TABLETS BY MOUTH EVERY DAY 180 tablet 1  . Multiple Vitamin (MULTIVITAMIN) capsule Take 1 capsule by mouth daily.    . Omega-3 Fatty Acids (FISH OIL) 1200 MG CAPS Take 1,200 mg by mouth daily.     . TRUE METRIX BLOOD GLUCOSE TEST test strip CHECK BLOOD SUGAR ONCE DAILY AND AS DIRECTED.DX E11.43 100 strip 1  . TRUEplus Lancets 30G MISC 1 EACH BY SUBDERMAL ROUTE DAILY. CHECK BLOOD SUGAR ONCE DAILY AND AS DIRECTED.DX E11.43 100 each 1   No current facility-administered medications on file prior to visit.     Review of Systems  Constitutional: Positive for fatigue. Negative for activity change, appetite change, fever and unexpected weight change.  HENT: Negative for congestion, ear pain, rhinorrhea, sinus pressure and sore throat.   Eyes: Negative for pain, redness and visual disturbance.  Respiratory: Positive for shortness of breath. Negative for cough, chest tightness, wheezing and stridor.   Cardiovascular: Positive for leg swelling. Negative for chest pain and palpitations.  Gastrointestinal: Negative for abdominal pain, blood in stool, constipation and diarrhea.  Endocrine: Negative for  polydipsia and polyuria.  Genitourinary: Negative for dysuria, frequency and urgency.  Musculoskeletal: Negative for arthralgias, back pain and myalgias.  Skin: Negative for pallor and rash.  Allergic/Immunologic: Negative for environmental  allergies.  Neurological: Negative for dizziness, syncope and headaches.  Hematological: Negative for adenopathy. Does not bruise/bleed easily.  Psychiatric/Behavioral: Negative for decreased concentration and dysphoric mood. The patient is not nervous/anxious.        Objective:   Physical Exam Constitutional:      General: She is not in acute distress.    Appearance: Normal appearance. She is well-developed. She is obese. She is not ill-appearing.     Comments: Frail appearing elderly female  HENT:     Head: Normocephalic and atraumatic.  Eyes:     General: No scleral icterus.    Conjunctiva/sclera: Conjunctivae normal.     Pupils: Pupils are equal, round, and reactive to light.  Neck:     Thyroid: No thyromegaly.     Vascular: No carotid bruit or JVD.  Cardiovascular:     Rate and Rhythm: Normal rate and regular rhythm.     Pulses: Normal pulses.     Heart sounds: Murmur heard.  No gallop.   Pulmonary:     Effort: Pulmonary effort is normal. No respiratory distress.     Breath sounds: Normal breath sounds. No wheezing or rales.  Abdominal:     General: Bowel sounds are normal. There is no distension or abdominal bruit.     Palpations: Abdomen is soft. There is no mass.     Tenderness: There is no abdominal tenderness.  Musculoskeletal:     Cervical back: Normal range of motion and neck supple. No tenderness.     Comments: Trace if any pedal edema today  Lymphadenopathy:     Cervical: No cervical adenopathy.  Skin:    General: Skin is warm and dry.     Coloration: Skin is not pale.     Findings: No erythema or rash.     Comments: Clubbing of fingernails noted on all fingers  Neurological:     Mental Status: She is alert.     Cranial Nerves: No cranial nerve deficit.     Deep Tendon Reflexes: Reflexes are normal and symmetric.     Comments: Generalized weakness In wheelchair  Psychiatric:     Comments: Mildly anxious  Pleasant            Assessment & Plan:    Problem List Items Addressed This Visit      Cardiovascular and Mediastinum   Essential hypertension    bp in fair control at this time  BP Readings from Last 1 Encounters:  04/12/20 131/70   No changes needed Most recent labs reviewed  Disc lifstyle change with low sodium diet and exercise        Relevant Medications   rosuvastatin (CRESTOR) 10 MG tablet   CHF (congestive heart failure) (HCC) - Primary    Fluid overload is the most likely cause of her  Cough/sob  Has clubbing of finger nails today (new in past several mo) worrisome for chronic hypoxia (pulse ox did inc after sitting) Disc home 02 if needed- family will invest in pulse ox to monitor Improved with lasix and several lb wt loss (swelling and sob and cough are improved)  Will inc dose for 2 d to 40 mg daily and see if more imp  Family will then call to update  Monitor pulse ox as well as daily wt  Rev last echo  With change in symptoms-will re ref to cardiology  Disc diet with less sodium /processed foods        Relevant Medications   rosuvastatin (CRESTOR) 10 MG tablet   Other Relevant Orders   Ambulatory referral to Cardiology     Genitourinary   CKD (chronic kidney disease)    Stable despite lasix initiation  Will continue to follow         Other   Thrombocytopenia ([place])    Last pl ct 121 in context of MDS      Dyspnea on exertion    This has improved with lasix  Suspect due to fluid overload  Pend cardiology visit      Relevant Orders   Ambulatory referral to Cardiology

## 2020-04-12 NOTE — Assessment & Plan Note (Signed)
Fluid overload is the most likely cause of her  Cough/sob  Has clubbing of finger nails today (new in past several mo) worrisome for chronic hypoxia (pulse ox did inc after sitting) Disc home 02 if needed- family will invest in pulse ox to monitor Improved with lasix and several lb wt loss (swelling and sob and cough are improved)  Will inc dose for 2 d to 40 mg daily and see if more imp  Family will then call to update  Monitor pulse ox as well as daily wt  Rev last echo  With change in symptoms-will re ref to cardiology  Disc diet with less sodium /processed foods

## 2020-04-12 NOTE — Assessment & Plan Note (Signed)
Last pl ct 121 in context of MDS

## 2020-04-15 ENCOUNTER — Telehealth: Payer: Self-pay | Admitting: Family Medicine

## 2020-04-15 NOTE — Telephone Encounter (Signed)
That is worrisome- I think it is best if they take her to the ER for further evaluation to make sure nothing else is going on and to have the option of IV diuretics if needed

## 2020-04-15 NOTE — Telephone Encounter (Signed)
Daughter notified of Dr. Marliss Coots recommendations

## 2020-04-15 NOTE — Telephone Encounter (Signed)
I touched base with her daughter. In fact now her pulse ox with ambulation is mid 80s (with exertion) and resting is normal.  The thinks the reading in the 70s was a false reading because they were just starting to use the new pulse oximeter. Overall cough is improved but not gone and swelling is improved In fact, she went and had her hair done today (that did tire her out)  I inst her to watch carefully over the weekend and if symptoms worsen or pulse ox drops let us know and go to the ER for further eval   I explained that we cannot rule out some other lung conditions (like pneumonia or blood clot) without more imaging

## 2020-04-15 NOTE — Telephone Encounter (Signed)
Patient's daughter called in to update PCP that mother lost 1 lb on lasix but blood sugar level went up about 20. Please advise.

## 2020-04-15 NOTE — Telephone Encounter (Signed)
Spoke with daughter, advised her of Dr. Marliss Coots comments. Pt will keep taking lasix but she said that pt is still coughing a lot, pt's swelling is down some and seems stable. Daughter said breathing isn't good, when pt is walking they checked her pulse ox and it is dropping very low 70s, and pulse was 50, once pt sat down her O2 did recover back up to the high 80s / low 90s but they are still worried

## 2020-04-15 NOTE — Telephone Encounter (Signed)
Thanks for letting me know. The lasix can raise glucose a little bit but I still want to continue it for now (watch diet the best she can) How is her breathing/ chest symptoms and swelling ?

## 2020-04-28 ENCOUNTER — Other Ambulatory Visit: Payer: Self-pay | Admitting: Family Medicine

## 2020-04-28 DIAGNOSIS — I5032 Chronic diastolic (congestive) heart failure: Secondary | ICD-10-CM

## 2020-04-29 ENCOUNTER — Other Ambulatory Visit: Payer: Self-pay

## 2020-04-29 ENCOUNTER — Ambulatory Visit: Payer: Medicare HMO | Admitting: Cardiovascular Disease

## 2020-04-29 ENCOUNTER — Encounter: Payer: Self-pay | Admitting: Cardiovascular Disease

## 2020-04-29 VITALS — BP 142/70 | HR 73 | Ht 62.0 in | Wt 158.0 lb

## 2020-04-29 DIAGNOSIS — R0600 Dyspnea, unspecified: Secondary | ICD-10-CM | POA: Diagnosis not present

## 2020-04-29 DIAGNOSIS — R0609 Other forms of dyspnea: Secondary | ICD-10-CM

## 2020-04-29 MED ORDER — OMEPRAZOLE 40 MG PO CPDR: 40.0000 mg | DELAYED_RELEASE_CAPSULE | Freq: Every day | ORAL | 1 refills | Status: DC

## 2020-04-29 NOTE — Progress Notes (Signed)
Cardiology Office Note  Date:  04/29/2020   ID:  Stacey, Harris [birth year], MRN [id number]  PCP:  [name], MD   Chief Complaint  Patient presents with  . OTHER    Per daughter she has been having a very bad cough and oxygen levels were low so they referred her to come here. Medications verbally reviewed with patient's daughter.    HPI:  [name] is an 86 yo woman with past medical history of Shortness of breath on exertion Nonsmoker, lots of secondary hand smoke Worked in Clinical cytogeneticist Fatigue Chronic pain Deconditioning MDS, anemia, HGB 10.4 Leg swelling GERD Diabetes type 2 Hypertension Carpal tunnel F/u of her shortness of breath and pulmonary edema  Worsening SOB, cough Sx worse after staying with sister,  Now on lasix 2 weeks ago SOB no better, cough has still persisted swelling better BNP 174  delsum cough better  Lab work reviewed HGB 11 CR 1.48, BUN25 Blood count stable  CXR: Diffuse bilateral interstitial prominence. Findings consistent with an active interstitial process such as CHF and or pneumonitis.  Had a remote fall, None recently  EKG personally reviewed by myself on todays visit Shows normal sinus rhythm with rate 73 bpm PACs right bundle branch block  PMH:   has a past medical history of Blood transfusion, Cancer ([place]), Diabetes mellitus, Frequent UTI, Hyperlipidemia, Hypertension, Hypothyroidism, Macular degeneration of both eyes, OA (osteoarthritis), and Stroke ([place]) (03/30/2019).  PSH:    Past Surgical History:  Procedure Laterality Date  . ANKLE FRACTURE SURGERY  10   rt  . CARPAL TUNNEL RELEASE Right 06/06/2017   Procedure: RIGHT CARPAL TUNNEL RELEASE;  Surgeon: Daryll Brod, MD;  Location: [facility];  Service: Orthopedics;  Laterality: Right;  . CATARACT EXTRACTION W/ INTRAOCULAR LENS  IMPLANT, BILATERAL Bilateral 2019  . JOINT REPLACEMENT Right 2010   rt total hip 10  . LUMBAR  LAMINECTOMY/DECOMPRESSION MICRODISCECTOMY  06/22/2011   Procedure: LUMBAR LAMINECTOMY/DECOMPRESSION MICRODISCECTOMY;  Surgeon: Marybelle Killings;  Location: [facility];  Service: Orthopedics;  Laterality: N/A;  L3-4, L4-5 Decompression  . MOHS SURGERY  12/08   basal cell skin cancer lt temple  . TAH      and BSO, age 70  . TONSILLECTOMY      Current Outpatient Medications  Medication Sig Dispense Refill  . acetaminophen (TYLENOL) 325 MG tablet Take 1-2 tablets (325-650 mg total) by mouth every 4 (four) hours as needed for mild pain.    [name] aspirin EC 81 MG EC tablet Take 1 tablet (81 mg total) by mouth daily.    . Cholecalciferol (VITAMIN D PO) Take 5,000 Units by mouth daily.    [name] CRANBERRY PO Take 2 tablets by mouth daily.     . furosemide (LASIX) 20 MG tablet TAKE 1 TABLET BY MOUTH EVERY DAY 30 tablet 1  . glimepiride (AMARYL) 4 MG tablet TAKE 1 TABLET BY MOUTH EVERY DAY BEFORE BREAKFAST 90 tablet 3  . levothyroxine (SYNTHROID) 50 MCG tablet Take 1 tablet (50 mcg total) by mouth daily. 90 tablet 3  . losartan (COZAAR) 25 MG tablet TAKE 2 TABLETS BY MOUTH EVERY DAY 180 tablet 1  . Multiple Vitamin (MULTIVITAMIN) capsule Take 1 capsule by mouth daily.    . Omega-3 Fatty Acids (FISH OIL) 1200 MG CAPS Take 1,200 mg by mouth daily.     . rosuvastatin (CRESTOR) 10 MG tablet Take 1 tablet (10 mg total) by mouth daily. 90 tablet 3  .  TRUE METRIX BLOOD GLUCOSE TEST test strip CHECK BLOOD SUGAR ONCE DAILY AND AS DIRECTED.DX E11.43 100 strip 1  . TRUEplus Lancets 30G MISC 1 EACH BY SUBDERMAL ROUTE DAILY. CHECK BLOOD SUGAR ONCE DAILY AND AS DIRECTED.DX E11.43 100 each 1   No current facility-administered medications for this visit.     Allergies:   Keflex [cephalexin] and Mavik [trandolapril]   Social History:  The patient  reports that she has never smoked. She has never used smokeless tobacco. She reports that she does not drink alcohol and does not use drugs.   Family History:   family history  includes Arthritis in her father and sister; Cancer in her brother, brother, brother, and brother; Diabetes in her brother, brother, brother, father, and sister; Heart disease in her father; Hypertension in her father.    Review of Systems: Review of Systems  Constitutional: Negative.   HENT: Negative.   Respiratory: Positive for cough and shortness of breath.   Cardiovascular: Negative.   Gastrointestinal: Negative.   Musculoskeletal: Negative.   Neurological: Negative.   Psychiatric/Behavioral: Negative.   All other systems reviewed and are negative.   PHYSICAL EXAM: VS:  BP (!) 142/70 (BP Location: Left Arm, Patient Position: Sitting, Cuff Size: Normal)   Pulse 73   Ht 5\' 2"  (1.575 m)   Wt 158 lb (71.7 kg)   SpO2 90%   BMI 28.90 kg/m  , BMI Body mass index is 28.9 kg/m. GEN: Well nourished, well developed, in no acute distress  HEENT: normal  Neck: no JVD, carotid bruits, or masses Cardiac: RRR; no murmurs, rubs, or gallops,nonpitting lower extremity edema On auscultation of heart sounds, we did appreciate bowel sounds in her upper chest even close to her clavicles bilaterally Respiratory:  clear to auscultation bilaterally, normal work of breathing GI: soft, nontender, nondistended, + BS MS: no deformity or atrophy  Skin: warm and dry, no rash Neuro:  Strength and sensation are intact Psych: euthymic mood, full affect   Recent Labs: 04/05/2020: ALT 21; Hemoglobin 11.0; Platelets 121.0; Pro B Natriuretic peptide (BNP) 174.0; TSH 2.95 04/08/2020: BUN 25; Creatinine, Ser 1.48; Potassium 4.1; Sodium 138    Lipid Panel Lab Results  Component Value Date   CHOL 132 07/31/2019   HDL 28.00 (L) 07/31/2019   LDLCALC 81 07/31/2019   TRIG 114.0 07/31/2019      Wt Readings from Last 3 Encounters:  04/29/20 158 lb (71.7 kg)  04/12/20 159 lb 7 oz (72.3 kg)  04/05/20 161 lb 8 oz (73.3 kg)       ASSESSMENT AND PLAN:  Controlled type 2 diabetes mellitus with diabetic  autonomic neuropathy, without long-term current use of insulin ([place]) - Plan: EKG 12-Lead Weight stable,No changes to her medications  Acute pulmonary edema (HCC) - Plan: EKG 12-Lead Echocardiogram from last year reviewed, normal study Normal right heart pressures, no significant valve disease, normal ejection fraction Continue Lasix 20 daily with close monitoring of renal function  Essential hypertension Blood pressure stable, no changes made  Hiatal hernia Concerned this could be contributing to her cough and pneumonitis on x-ray Markedly loud bowel sounds noted in her upper mediastinal airway concerning for hiatal hernia Family who presents with her today also reports she has very loud bowel sounds at times appreciated by daycare provider -Recommend omeprazole 40 mg daily given cough, possible pneumonitis and shortness of breath  MDS (myelodysplastic syndrome) (HCC) Stable blood count  SOB (shortness of breath) on exertion - Plan: ECHOCARDIOGRAM COMPLETE Symptoms worse when  laying flat, coughing at night, better with Delsym Concern for reflux Chest x-ray with concern for pneumonitis, also chest x-ray showing hiatal hernia This would be consistent with exam with bowel sounds in her upper mediastinal area -Omeprazole 40 daily    Total encounter time more than 25 minutes  Greater than 50% was spent in counseling and coordination of care with the patient    Orders Placed This Encounter  Procedures  . EKG 12-Lead     Signed, Esmond Plants, M.D., Ph.D. 04/29/2020  [place], [place]

## 2020-04-29 NOTE — Patient Instructions (Addendum)
Medication Instructions:  Please start omeprazole 40 mg daily  If you need a refill on your cardiac medications before your next appointment, please call your pharmacy.    Lab work: No new labs needed   If you have labs (blood work) drawn today and your tests are completely normal, you will receive your results only by: [name] MyChart Message (if you have MyChart) OR . A paper copy in the mail If you have any lab test that is abnormal or we need to change your treatment, we will call you to review the results.   Testing/Procedures: No new testing needed   Follow-Up: At [facility], you and your health needs are our priority.  As part of our continuing mission to provide you with exceptional heart care, we have created designated Provider Care Teams.  These Care Teams include your primary Cardiologist (physician) and Advanced Practice Providers (APPs -  Physician Assistants and Nurse Practitioners) who all work together to provide you with the care you need, when you need it.  . You will need a follow up appointment as needed  . Providers on your designated Care Team:   . Murray Hodgkins, NP . Christell Faith, PA-C . Marrianne Mood, PA-C  Any Other Special Instructions Will Be Listed Below (If Applicable).  COVID-19 Vaccine Information can be found at: ShippingScam.co.uk For questions related to vaccine distribution or appointments, please email vaccine@[email or web address] or call 7813935696.

## 2020-05-04 ENCOUNTER — Ambulatory Visit: Payer: Medicare HMO | Admitting: Cardiovascular Disease

## 2020-05-12 ENCOUNTER — Telehealth: Payer: Self-pay | Admitting: Family Medicine

## 2020-05-12 NOTE — Chronic Care Management (AMB) (Signed)
  Chronic Care Management   Note  05/12/2020 Name: Stacey Harris MRN: [id number] DOB: [birth year]  [name] Stacey Harris is a 86 y.o. year old female who is a primary care patient of Tower, Wynelle [name], MD. I reached out to [place] Bailiff by phone today in response to a referral sent by [name]'s PCP, Tower, Wynelle [name], MD.   [name] was given information about Chronic Care Management services today including:  1. CCM service includes personalized support from designated clinical staff supervised by her physician, including individualized plan of care and coordination with other care providers 2. 24/7 contact phone numbers for assistance for urgent and routine care needs. 3. Service will only be billed when office clinical staff spend 20 minutes or more in a month to coordinate care. 4. Only one practitioner may furnish and bill the service in a calendar month. 5. The patient may stop CCM services at any time (effective at the end of the month) by phone call to the office staff.   Stacey Harris verbally agreed to assistance and services provided by embedded care coordination/care management team today.  Follow up plan:   [place]

## 2020-05-21 ENCOUNTER — Other Ambulatory Visit: Payer: Self-pay | Admitting: Family Medicine

## 2020-05-21 DIAGNOSIS — I5032 Chronic diastolic (congestive) heart failure: Secondary | ICD-10-CM

## 2020-05-24 ENCOUNTER — Telehealth: Payer: Self-pay

## 2020-05-24 NOTE — Telephone Encounter (Signed)
Stacey Harris (DPR signed) left v/m;pharmacy advised Stacey Harris that lasix rx was denied and Stacey Harris request cb with why and request refill to be done. I spoke with Alyse Low at [place] and she said she was not sure what happened but pt does have available refill for furosemide 20 mg and she will get it filled for pt. I called Stacey Harris to let her know there was an available refill for the furosemide (lasix) 20 mg. Stacey Harris said she did not know what happened but the refill just needs to be on hold for now; pt does not need med. I apologized that I thought in the v/m initially left that pt did need a refill and if Stacey Harris would call [facility] and speak with Alyse Low so she would not fill the med and leave on hold until needed. Stacey Harris voiced understanding and said she did not make that clear in initial call. Nothing further needed at this time.

## 2020-05-25 ENCOUNTER — Encounter: Payer: Self-pay | Admitting: Family Medicine

## 2020-05-25 ENCOUNTER — Telehealth: Payer: Self-pay

## 2020-05-25 ENCOUNTER — Telehealth (INDEPENDENT_AMBULATORY_CARE_PROVIDER_SITE_OTHER): Payer: Medicare HMO | Admitting: Family Medicine

## 2020-05-25 DIAGNOSIS — N390 Urinary tract infection, site not specified: Secondary | ICD-10-CM

## 2020-05-25 DIAGNOSIS — N3000 Acute cystitis without hematuria: Secondary | ICD-10-CM

## 2020-05-25 DIAGNOSIS — R410 Disorientation, unspecified: Secondary | ICD-10-CM | POA: Diagnosis not present

## 2020-05-25 DIAGNOSIS — R059 Cough, unspecified: Secondary | ICD-10-CM | POA: Diagnosis not present

## 2020-05-25 LAB — POC URINALSYSI DIPSTICK (AUTOMATED)
Bilirubin, UA: 1
Blood, UA: 25
Glucose, UA: NEGATIVE
Ketones, UA: NEGATIVE
Nitrite, UA: NEGATIVE
Protein, UA: NEGATIVE
Spec Grav, UA: 1.025 (ref 1.010–1.025)
Urobilinogen, UA: 0.2 E.U./dL
pH, UA: 6 (ref 5.0–8.0)

## 2020-05-25 MED ORDER — ALBUTEROL SULFATE HFA 108 (90 BASE) MCG/ACT IN AERS: 2.0000 | INHALATION_SPRAY | RESPIRATORY_TRACT | 3 refills | Status: DC | PRN

## 2020-05-25 MED ORDER — SULFAMETHOXAZOLE-TRIMETHOPRIM 800-160 MG PO TABS: 1.0000 | ORAL_TABLET | Freq: Two times a day (BID) | ORAL | 0 refills | Status: DC

## 2020-05-25 NOTE — Progress Notes (Signed)
Virtual Visit via Video Note  I connected with Stacey Harris on 05/25/20 at 12:30 PM EST by a video enabled telemedicine application and verified that I am speaking with the correct person using two identifiers.  Location: Patient: home Provider: office   I discussed the limitations of evaluation and management by telemedicine and the availability of in person appointments. The patient expressed understanding and agreed to proceed.  Parties involved in encounter  Patient: Stacey Harris Daughter:  Lidia Collum  Provider:  Loura Pardon MD    History of Present Illness: Pt presents for possible uti with confusion   Feels drunk/dizzy  Confused and forgets how to do things  Very sleepy  No nausea  No fever   More frequent urination -esp at night  No blood  No pain to urinate  Incontinence is baseline  Urine smells strong/bad  Drinking water- trying to do better    Results for orders placed or performed in visit on 05/25/20  POCT Urinalysis Dipstick (Automated)  Result Value Ref Range   Color, UA Yellow    Clarity, UA Hazy    Glucose, UA Negative Negative   Bilirubin, UA 1 mg/dL    Ketones, UA Negative    Spec Grav, UA 1.025 1.010 - 1.025   Blood, UA 25 Ery/uL    pH, UA 6.0 5.0 - 8.0   Protein, UA Negative Negative   Urobilinogen, UA 0.2 0.2 or 1.0 E.U./dL   Nitrite, UA Negative    Leukocytes, UA Large (3+) (A) Negative     She has h/o incontinence and utis in the past  Also renal insuff  Lab Results  Component Value Date   CREATININE 1.48 (H) 04/08/2020   BUN 25 (H) 04/08/2020   NA 138 04/08/2020   K 4.1 04/08/2020   CL 105 04/08/2020   CO2 25 04/08/2020   Cannot take keflex   Also fighting ongoing cough Dr Rockey Situ dx he with gerd and started omeprazole which helped some Still cough with exertion  Wants to try an albuterol inhaler    Patient Active Problem List   Diagnosis Date Noted  . Acute cystitis 05/25/2020  . Cough 05/25/2020  . CHF  (congestive heart failure) ([place]) 04/12/2020  . Cerumen impaction 07/31/2019  . Iron deficiency 04/29/2019  . Carpal tunnel syndrome of left wrist   . Myelodysplastic syndrome ([place])   . H/O: CVA (cerebrovascular accident)   . Hypomagnesemia   . CKD (chronic kidney disease) 03/30/2019  . Recurrent UTI (urinary tract infection) 09/17/2018  . Fall at home 09/17/2018  . Frequent urination 04/29/2018  . Elevated serum creatinine 04/11/2018  . Dysuria 11/12/2017  . Dyspnea on exertion 11/12/2017  . History of fall 08/27/2016  . Controlled type 2 diabetes mellitus with diabetic autonomic neuropathy ([place]) 02/24/2016  . Routine general medical examination at a health care facility 02/12/2016  . MDS (myelodysplastic syndrome) ([place]) 10/24/2015  . Normocytic anemia 02/11/2015  . GERD (gastroesophageal reflux disease) 08/06/2014  . Encounter for Medicare annual wellness exam 08/03/2013  . Thrombocytopenia ([place]) 08/03/2013  . Insomnia 01/26/2013  . Hypothyroid 09/17/2011  . Lumbar spinal stenosis 06/21/2011    Class: Present on Admission  . BACK PAIN, LUMBAR 08/23/2010  . POSTNASAL DRIP 08/23/2010  . OSTEOARTHRITIS, HIP 03/16/2009  . BASAL CELL CARCINOMA, FACE 07/01/2007  . BASAL CELL CARCINOMA, FACE 07/01/2007  . DIABETIC PERIPHERAL NEUROPATHY 09/24/2006  . Hyperlipidemia associated with type 2 diabetes mellitus ([place]) 09/24/2006  . Essential hypertension 09/24/2006  . ROSACEA  09/24/2006  . INCONTINENCE, URGE 09/24/2006  . COLONOSCOPY AND REMOVAL OF LESION, HX OF 06/09/2003   Past Medical History:  Diagnosis Date  . Blood transfusion   . Cancer ([place])    basal cell skin CA  . Diabetes mellitus    type II  . Frequent UTI   . Hyperlipidemia   . Hypertension   . Hypothyroidism   . Macular degeneration of both eyes   . OA (osteoarthritis)   . Stroke [facility]) 03/30/2019   Past Surgical History:  Procedure Laterality Date  . ANKLE FRACTURE SURGERY  10   rt  . CARPAL TUNNEL RELEASE  Right 06/06/2017   Procedure: RIGHT CARPAL TUNNEL RELEASE;  Surgeon: Daryll Brod, MD;  Location: [place];  Service: Orthopedics;  Laterality: Right;  . CATARACT EXTRACTION W/ INTRAOCULAR LENS  IMPLANT, BILATERAL Bilateral 2019  . JOINT REPLACEMENT Right 2010   rt total hip 10  . LUMBAR LAMINECTOMY/DECOMPRESSION MICRODISCECTOMY  06/22/2011   Procedure: LUMBAR LAMINECTOMY/DECOMPRESSION MICRODISCECTOMY;  Surgeon: Marybelle Killings;  Location: [place];  Service: Orthopedics;  Laterality: N/A;  L3-4, L4-5 Decompression  . MOHS SURGERY  12/08   basal cell skin cancer lt temple  . TAH      and BSO, age 64  . TONSILLECTOMY     Social History   Tobacco Use  . Smoking status: Never Smoker  . Smokeless tobacco: Never Used  Vaping Use  . Vaping Use: Never used  Substance Use Topics  . Alcohol use: No    Alcohol/week: 0.0 standard drinks  . Drug use: No   Family History  Problem Relation Age of Onset  . Arthritis Father        RA  . Hypertension Father   . Heart disease Father        CAD  . Diabetes Father   . Arthritis Sister        RA  . Diabetes Brother   . Cancer Brother   . Diabetes Brother   . Cancer Brother   . Diabetes Brother   . Cancer Brother   . Diabetes Sister   . Cancer Brother    Allergies  Allergen Reactions  . Keflex [Cephalexin]     Did ok with lower dose, higher dose caused a rash    . Mavik [Trandolapril]     Cough    Current Outpatient Medications on File Prior to Visit  Medication Sig Dispense Refill  . acetaminophen (TYLENOL) 325 MG tablet Take 1-2 tablets (325-650 mg total) by mouth every 4 (four) hours as needed for mild pain.    [name] aspirin EC 81 MG EC tablet Take 1 tablet (81 mg total) by mouth daily.    . Cholecalciferol (VITAMIN D PO) Take 5,000 Units by mouth daily.    [name] CRANBERRY PO Take 2 tablets by mouth daily.     . furosemide (LASIX) 20 MG tablet TAKE 1 TABLET BY MOUTH EVERY DAY 30 tablet 1  . glimepiride (AMARYL) 4 MG tablet  TAKE 1 TABLET BY MOUTH EVERY DAY BEFORE BREAKFAST 90 tablet 3  . levothyroxine (SYNTHROID) 50 MCG tablet Take 1 tablet (50 mcg total) by mouth daily. 90 tablet 3  . losartan (COZAAR) 25 MG tablet TAKE 2 TABLETS BY MOUTH EVERY DAY 180 tablet 1  . Multiple Vitamin (MULTIVITAMIN) capsule Take 1 capsule by mouth daily.    . Omega-3 Fatty Acids (FISH OIL) 1200 MG CAPS Take 1,200 mg by mouth daily.     [name]  omeprazole (PRILOSEC) 40 MG capsule Take 1 capsule (40 mg total) by mouth daily. 90 capsule 1  . rosuvastatin (CRESTOR) 10 MG tablet Take 1 tablet (10 mg total) by mouth daily. 90 tablet 3  . TRUE METRIX BLOOD GLUCOSE TEST test strip CHECK BLOOD SUGAR ONCE DAILY AND AS DIRECTED.DX E11.43 100 strip 1  . TRUEplus Lancets 30G MISC 1 EACH BY SUBDERMAL ROUTE DAILY. CHECK BLOOD SUGAR ONCE DAILY AND AS DIRECTED.DX E11.43 100 each 1   No current facility-administered medications on file prior to visit.    Review of Systems  Constitutional: Negative for chills, fever and malaise/fatigue.  HENT: Negative for congestion, ear pain, sinus pain and sore throat.   Eyes: Negative for blurred vision, discharge and redness.  Respiratory: Positive for cough. Negative for sputum production, shortness of breath, wheezing and stridor.   Cardiovascular: Negative for chest pain, palpitations and leg swelling.  Gastrointestinal: Negative for abdominal pain, diarrhea, nausea and vomiting.  Genitourinary: Positive for frequency and urgency. Negative for dysuria, flank pain and hematuria.  Musculoskeletal: Negative for myalgias.  Skin: Negative for rash.  Neurological: Negative for dizziness and headaches.  Psychiatric/Behavioral: Positive for memory loss.       Confusion     .Observations/Objective:  Patient appears well, in no distress (seems fatigued) Weight is baseline  No facial swelling or asymmetry Normal voice-not hoarse and no slurred speech No obvious tremor or mobility impairment Moving neck and UEs  normally Very hard of hearing No cough or shortness of breath during interview  Cognition is slow- family helps with history No skin changes on face or neck , no rash or pallor Affect is normal   Assessment and Plan: Problem List Items Addressed This Visit      Genitourinary   Recurrent UTI (urinary tract infection)   Relevant Medications   sulfamethoxazole-trimethoprim (BACTRIM DS) 800-160 MG tablet   Acute cystitis - Primary    With cognitive change and fatigue and frequency ua positive-sent for cx Sent 5 d course of bactrim ds (cannot take keflex), aware of renal insuff  Will update with culture result Encouraged water intake  inst to call if symptoms suddenly worsen      Relevant Orders   Urine Culture     Other   Cough    Helped by ppi from cardiology but still not gone Sent albuterol mdi with inst for use  Disc poss side eff inst to update        Other Visit Diagnoses    Confusion       Relevant Orders   POCT Urinalysis Dipstick (Automated) (Completed)       Follow Up Instructions: Try to drink lots of water  Rest when you can  Take the bactrim ds as px for uti  We will contact you when culture returns  Call if symptoms worsen in the meantime   Try the albuterol mdi for cough and let us know if it does not help   I discussed the assessment and treatment plan with the patient. The patient was provided an opportunity to ask questions and all were answered. The patient agreed with the plan and demonstrated an understanding of the instructions.   The patient was advised to call back or seek an in-person evaluation if the symptoms worsen or if the condition fails to improve as anticipated.     Loura Pardon, MD

## 2020-05-25 NOTE — Telephone Encounter (Signed)
I will see her then  

## 2020-05-25 NOTE — Assessment & Plan Note (Signed)
Helped by ppi from cardiology but still not gone Sent albuterol mdi with inst for use  Disc poss side eff inst to update

## 2020-05-25 NOTE — Patient Instructions (Signed)
Try to drink lots of water  Rest when you can  Take the bactrim ds as px for uti  We will contact you when culture returns  Call if symptoms worsen in the meantime   Try the albuterol mdi for cough and let us know if it does not help

## 2020-05-25 NOTE — Assessment & Plan Note (Signed)
With cognitive change and fatigue and frequency ua positive-sent for cx Sent 5 d course of bactrim ds (cannot take keflex), aware of renal insuff  Will update with culture result Encouraged water intake  inst to call if symptoms suddenly worsen

## 2020-05-25 NOTE — Telephone Encounter (Signed)
Stacey Harris (DPR signed) left v/m that pt has been more confused the last few days. Stacey Harris has sterile urine container at home and wanted to know if could bring urine for testing to see if possible UTI. No available appts at [facility] today. There are available appts on 05/26/20. Stacey Harris request cb. Sending note to Dr Glori Bickers and [place] CMA.

## 2020-05-25 NOTE — Telephone Encounter (Signed)
Per Dr. Glori Bickers okay to do urine drop off with a virtual visit at 12:30, daughter will drop off urine sample before appt.

## 2020-05-27 LAB — URINE CULTURE
MICRO NUMBER:: 11215603
SPECIMEN QUALITY:: ADEQUATE

## 2020-06-06 ENCOUNTER — Telehealth: Payer: Self-pay

## 2020-06-06 ENCOUNTER — Other Ambulatory Visit: Payer: Self-pay | Admitting: Family Medicine

## 2020-06-06 NOTE — Progress Notes (Addendum)
06/06/2020-Daughter unable to talk at the moment due to traveling.    Chronic Care Management Pharmacy Assistant   Name: Stacey Harris  MRN: [id number] DOB: [birth year]  Reason for Encounter: Initial questions for CCM visit   PCP : [name], MD  Allergies:   Allergies  Allergen Reactions   Keflex [Cephalexin]     Did ok with lower dose, higher dose caused a rash     Mavik [Trandolapril]     Cough     Medications: Outpatient Encounter Medications as of 06/06/2020  Medication Sig   acetaminophen (TYLENOL) 325 MG tablet Take 1-2 tablets (325-650 mg total) by mouth every 4 (four) hours as needed for mild pain.   albuterol (VENTOLIN HFA) 108 (90 Base) MCG/ACT inhaler Inhale 2 puffs into the lungs every 4 (four) hours as needed for wheezing (cough).   aspirin EC 81 MG EC tablet Take 1 tablet (81 mg total) by mouth daily.   Cholecalciferol (VITAMIN D PO) Take 5,000 Units by mouth daily.   CRANBERRY PO Take 2 tablets by mouth daily.    furosemide (LASIX) 20 MG tablet TAKE 1 TABLET BY MOUTH EVERY DAY   glimepiride (AMARYL) 4 MG tablet TAKE 1 TABLET BY MOUTH EVERY DAY BEFORE BREAKFAST   levothyroxine (SYNTHROID) 50 MCG tablet Take 1 tablet (50 mcg total) by mouth daily.   losartan (COZAAR) 25 MG tablet TAKE 2 TABLETS BY MOUTH EVERY DAY   Multiple Vitamin (MULTIVITAMIN) capsule Take 1 capsule by mouth daily.   Omega-3 Fatty Acids (FISH OIL) 1200 MG CAPS Take 1,200 mg by mouth daily.    omeprazole (PRILOSEC) 40 MG capsule Take 1 capsule (40 mg total) by mouth daily.   rosuvastatin (CRESTOR) 10 MG tablet Take 1 tablet (10 mg total) by mouth daily.   sulfamethoxazole-trimethoprim (BACTRIM DS) 800-160 MG tablet Take 1 tablet by mouth 2 (two) times daily. With food   TRUE METRIX BLOOD GLUCOSE TEST test strip CHECK BLOOD SUGAR ONCE DAILY AND AS DIRECTED.DX E11.43   TRUEplus Lancets 30G MISC 1 EACH BY SUBDERMAL ROUTE DAILY. CHECK BLOOD SUGAR ONCE DAILY AND AS DIRECTED.DX E11.43   No  facility-administered encounter medications on file as of 06/06/2020.    Current Diagnosis: Patient Active Problem List   Diagnosis Date Noted   Acute cystitis 05/25/2020   Cough 05/25/2020   CHF (congestive heart failure) ([place]) 04/12/2020   Cerumen impaction 07/31/2019   Iron deficiency 04/29/2019   Carpal tunnel syndrome of left wrist    Myelodysplastic syndrome (H[facility])    H/O: CVA (cerebrovascular accident)    Hypomagnesemia    CKD (chronic kidney disease) 03/30/2019   Recurrent UTI (urinary tract infection) 09/17/2018   Fall at home 09/17/2018   Frequent urination 04/29/2018   Elevated serum creatinine 04/11/2018   Dysuria 11/12/2017   Dyspnea on exertion 11/12/2017   History of fall 08/27/2016   Controlled type 2 diabetes mellitus with diabetic autonomic neuropathy ([place]) 02/24/2016   Routine general medical examination at a health care facility 02/12/2016   MDS (myelodysplastic syndrome) ([place]) 10/24/2015   Normocytic anemia 02/11/2015   GERD (gastroesophageal reflux disease) 08/06/2014   Encounter for Medicare annual wellness exam 08/03/2013   Thrombocytopenia ([place]) 08/03/2013   Insomnia 01/26/2013   Hypothyroid 09/17/2011   Lumbar spinal stenosis 06/21/2011    Class: Present on Admission   BACK PAIN, LUMBAR 08/23/2010   POSTNASAL DRIP 08/23/2010   OSTEOARTHRITIS, HIP 03/16/2009   BASAL CELL CARCINOMA, FACE 07/01/2007   BASAL CELL CARCINOMA, FACE  07/01/2007   DIABETIC PERIPHERAL NEUROPATHY 09/24/2006   Hyperlipidemia associated with type 2 diabetes mellitus ([place]) 09/24/2006   Essential hypertension 09/24/2006   ROSACEA 09/24/2006   INCONTINENCE, URGE 09/24/2006   COLONOSCOPY AND REMOVAL OF LESION, HX OF 06/09/2003    Goals Addressed   None     Follow-Up:  Pharmacist Review   Have you seen any other providers since your last visit? Yes 07/31/2019-Tower (PCP)- restart losartan 50 mg- 1 tablet daily; advised to check blood glucose different times throughout  day 10/20/2019- accucheck meter, strips and lancets sent to pharmacy 04/05/2020-Jessica Cody(PCP)-start allergy medication 04/12/2020-2 days of 40 mg furosemide for fluid overload. Referral to cardiology. Daily weight checks ordered.   05/25/2020- Dr Rockey Situ dx with GERD and started omeprazole; albuterol inhaler Bactrim 800-160 mg tablet x 5 days; started albuterol 108 mcg/act inhaler-inhale 2 puffs every 4 hours as needed for coughing and wheezing.  Any side effects from any medications? "Nagging cough" not improved with omeprazole  Do you have an symptoms or problems not managed by your medications?  Recurrent UTI's not managed by [place]. SOB upon exertion with frequent low pulse oximetry levels  Any concerns about your health right now? breathing and general weakness  Has your provider asked that you check blood pressure, blood sugar, or follow special diet at home? Yes, blood sugar tested one time a day (random) and pulse oximetry test when feeling SOB. Follows low salt, diabetic diet mostly.  Do you get any type of exercise on a regular basis? Unable to exercise or stretch on a regular basis due to low oxygen.  Can you think of a goal you would like to reach for your health? Decrease coughing and improve breathing. Also would like to see mother able to do more without feeling weak.   Do you have any problems getting your medications? None. Medication is not synced by family is often at the pharmacy for other things causing pick-up to be of no issue.   Is there anything that you would like to discuss during the appointment? Cough and breathing issues are a major concern.  Please have medications and supplements available during telephone appointment.   All information provided by and discussed with daughter Stacey Harris.  Daughter, Stacey Harris, is caregiver [phone].   Family would like to keep Ms. Tschetter off of home oxygen use as long as possible. They "do not feel ready for that  yet."  [facility], CMA  I have reviewed the care management and care coordination activities outlined in this encounter and information was discussed with patient during initial CCM visit.  Debbora Dus, PharmD Clinical Pharmacist [facility] at [facility] [phone]

## 2020-06-07 ENCOUNTER — Ambulatory Visit: Payer: Medicare HMO | Admitting: Cardiovascular Disease

## 2020-06-09 ENCOUNTER — Ambulatory Visit: Payer: Self-pay

## 2020-06-09 ENCOUNTER — Telehealth: Payer: Self-pay

## 2020-06-09 ENCOUNTER — Ambulatory Visit: Payer: Medicare HMO

## 2020-06-09 DIAGNOSIS — K219 Gastro-esophageal reflux disease without esophagitis: Secondary | ICD-10-CM

## 2020-06-09 DIAGNOSIS — E1143 Type 2 diabetes mellitus with diabetic autonomic (poly)neuropathy: Secondary | ICD-10-CM

## 2020-06-09 NOTE — Chronic Care Management (AMB) (Signed)
Spoke with patients daughter this morning and she reports pt is having new symptoms of UTI. Elevated BG and some mental status changes. She is requesting an antibiotic. Sending to PCP and CMA for follow up.

## 2020-06-09 NOTE — Telephone Encounter (Signed)
Will route to PCP to see if she wants to add pt, no appts tomorrow

## 2020-06-09 NOTE — Telephone Encounter (Signed)
Please put her in for 12:30 tomorrow.  In office is ok because this is a chronic cough  thanks

## 2020-06-09 NOTE — Telephone Encounter (Signed)
Spoke with pt's daughter 12/2 during [place] visit. She reported pt has elevated blood glucose readings and altered mental status and suspecting UTI. Denies fever. She is also concerned that pt's ongoing shortness of breath, cough and clear sputum may be bacterial. Reports omeprazole (started by cardiology) has not improved cough. Lasix has not improved symptoms. She would like further evaluation of cough, sputum, shortness of breath, and low oxygen upon standing.   Debbora Dus, PharmD Clinical Pharmacist [facility] at [facility] [phone]

## 2020-06-09 NOTE — Patient Instructions (Signed)
June 09, 2020  Dear [place] Bailiff,  It was a pleasure meeting you during our initial appointment on June 09, 2020. Below is a summary of the goals we discussed and components of chronic care management. Please contact me anytime with questions or concerns.   Visit Information  Ms. Navedo was given information about Chronic Care Management services today including:  1. CCM service includes personalized support from designated clinical staff supervised by her physician, including individualized plan of care and coordination with other care providers 2. 24/7 contact phone numbers for assistance for urgent and routine care needs. 3. Standard insurance, coinsurance, copays and deductibles apply for chronic care management only during months in which we provide at least 20 minutes of these services. Most insurances cover these services at 100%, however patients may be responsible for any copay, coinsurance and/or deductible if applicable. This service may help you avoid the need for more expensive face-to-face services. 4. Only one practitioner may furnish and bill the service in a calendar month. 5. The patient may stop CCM services at any time (effective at the end of the month) by phone call to the office staff.  Patient declined services.  The patient verbalized understanding of instructions, educational materials, and care plan provided today and declined offer to receive copy of patient instructions, educational materials, and care plan.  CCM enrollment status changed to "previously enrolled" as per patient request on 06/09/20 to discontinue enrollment. Case closed to case management services in primary care home.   Debbora Dus, PharmD Clinical Pharmacist [facility] at [facility] [phone]

## 2020-06-09 NOTE — Telephone Encounter (Signed)
Vm left at triage.  Pt's daughter, [name] (on dpr), states pt's UTI med ran out and thinks sxs have worsened.  She is requesting a refill.  Plz call [name] back at [phone].

## 2020-06-09 NOTE — Telephone Encounter (Signed)
Please let me know what symptoms she has  Also bring in sample for culture

## 2020-06-09 NOTE — Chronic Care Management (AMB) (Signed)
Chronic Care Management Pharmacy  Name: Stacey Harris  MRN: [id number] DOB: 04/04/1929  Chief Complaint/ HPI  Stacey Harris,  86 y.o. , female presents for their Initial CCM visit with the clinical pharmacist via telephone due to COVID-19 Pandemic. Spoke with patient's daughter, Stacey Harris.  PCP : [name], MD  Daughter was concerned about patients chronic cough, shortness of breath, UTIs, and oxygen levels dropping when standing. Politely declined interest in reviewing chronic conditions. Denies any medication problems.  Their chronic conditions include: HTN, DM, CHF, GERD, HLD, hypothyroidism, OA, CKD, neuropathy   Medications: Outpatient Encounter Medications as of 06/09/2020  Medication Sig   acetaminophen (TYLENOL) 325 MG tablet Take 1-2 tablets (325-650 mg total) by mouth every 4 (four) hours as needed for mild pain.   albuterol (VENTOLIN HFA) 108 (90 Base) MCG/ACT inhaler Inhale 2 puffs into the lungs every 4 (four) hours as needed for wheezing (cough).   aspirin EC 81 MG EC tablet Take 1 tablet (81 mg total) by mouth daily.   Cholecalciferol (VITAMIN D PO) Take 5,000 Units by mouth daily.   CRANBERRY PO Take 2 tablets by mouth daily.    furosemide (LASIX) 20 MG tablet TAKE 1 TABLET BY MOUTH EVERY DAY   glimepiride (AMARYL) 4 MG tablet TAKE 1 TABLET BY MOUTH EVERY DAY BEFORE BREAKFAST   levothyroxine (SYNTHROID) 50 MCG tablet Take 1 tablet (50 mcg total) by mouth daily.   losartan (COZAAR) 25 MG tablet TAKE 2 TABLETS BY MOUTH EVERY DAY   Multiple Vitamin (MULTIVITAMIN) capsule Take 1 capsule by mouth daily.   Omega-3 Fatty Acids (FISH OIL) 1200 MG CAPS Take 1,200 mg by mouth daily.    omeprazole (PRILOSEC) 40 MG capsule Take 1 capsule (40 mg total) by mouth daily.   rosuvastatin (CRESTOR) 10 MG tablet Take 1 tablet (10 mg total) by mouth daily.   sulfamethoxazole-trimethoprim (BACTRIM DS) 800-160 MG tablet Take 1 tablet by mouth 2 (two) times daily. With food    TRUE METRIX BLOOD GLUCOSE TEST test strip CHECK BLOOD SUGAR ONCE DAILY AND AS DIRECTED.DX E11.43   TRUEplus Lancets 30G MISC 1 EACH BY SUBDERMAL ROUTE DAILY. CHECK BLOOD SUGAR ONCE DAILY AND AS DIRECTED.DX E11.43   No facility-administered encounter medications on file as of 06/09/2020.   Current Diagnosis/Assessment:  Hypertension   CMP Latest Ref Rng & Units 04/08/2020 04/05/2020 07/31/2019  Glucose 70 - 99 mg/dL 302(H) 230(H) 212(H)  BUN 6 - 23 mg/dL 25(H) 24(H) 21  Creatinine 0.40 - 1.20 mg/dL 1.48(H) 1.44(H) 1.28(H)  Sodium 135 - 145 mEq/L 138 141 141  Potassium 3.5 - 5.1 mEq/L 4.1 4.3 4.2  Chloride 96 - 112 mEq/L 105 108 109  CO2 19 - 32 mEq/L 25 27 25   Calcium 8.4 - 10.5 mg/dL 9.0 9.1 9.7  Total Protein 6.0 - 8.3 g/dL - 6.5 6.6  Total Bilirubin 0.2 - 1.2 mg/dL - 0.9 0.8  Alkaline Phos 39 - 117 U/L - 74 64  AST 0 - 37 U/L - 37 38(H)  ALT 0 - 35 U/L - 21 19   Office blood pressures are: BP Readings from Last 3 Encounters:  04/29/20 (!) 142/70  04/12/20 131/70  04/05/20 138/68   BP goal < 140/90 mmHg Patient is currently controlled on the following medications:   Losartan 25 mg - 1 tablet daily  Plan: Continue current medications  Hyperlipidemia   LDL goal < 70  Last lipids Lab Results  Component Value Date   CHOL 132  07/31/2019   HDL 28.00 (L) 07/31/2019   LDLCALC 81 07/31/2019   LDLDIRECT 202.7 08/17/2010   TRIG 114.0 07/31/2019   CHOLHDL 5 07/31/2019   Hepatic Function Latest Ref Rng & Units 04/05/2020 07/31/2019 04/03/2019  Total Protein 6.0 - 8.3 g/dL 6.5 6.6 5.7(L)  Albumin 3.5 - 5.2 g/dL 2.8(L) 3.1(L) 2.3(L)  AST 0 - 37 U/L 37 38(H) 46(H)  ALT 0 - 35 U/L 21 19 27   Alk Phosphatase 39 - 117 U/L 74 64 56  Total Bilirubin 0.2 - 1.2 mg/dL 0.9 0.8 1.1  Bilirubin, Direct 0.0 - 0.3 mg/dL - - -     The ASCVD Risk score ([place]., et al., 2013) failed to calculate for the following reasons:   The 2013 ASCVD risk score is only valid for ages 13 to 83    The patient has a prior MI or stroke diagnosis   Patient is currently uncontrolled on the following medications:   Rosuvastatin 10 mg - 1 tablet daily  We discussed: LDL appropriate considering age   Plan: Continue current medications  Diabetes   Recent Relevant Labs: Lab Results  Component Value Date/Time   HGBA1C 6.4 07/31/2019 12:14 PM   HGBA1C 8.5 (H) 03/31/2019 06:38 AM   EGFR 46 (L) 09/03/2016 09:02 AM   EGFR 47 (L) 02/27/2016 09:46 AM   MICROALBUR 0.6 06/09/2009 09:33 AM   MICROALBUR 1.0 05/26/2008 10:01 AM    Patient has failed these meds in past: none reported Patient is currently controlled on the following medications:   Glimepiride 4 mg - 1 tablet daily  Lab Results  Component Value Date/Time   HMDIABEYEEXA No Retinopathy 01/20/2019 12:00 AM     Plan: Continue current medications   Heart Failure   Followed by Cardiology - per cardio 10/22, echo normal 2020, no valve disease Type: Diastolic Last ejection fraction: 60-65%  Patient has failed these meds in past: none reported Patient is currently controlled on the following medications:   Lasix 20 mg - 1 tablet daily   Losartan 25 mg - 1 tablet daily  We discussed: pt reports swelling has improved some with lasix, but SOB remains a concern. Omeprazole started by cardio for reflux and possible worsening of SOB, coughing. Denies any improvement with PPI - has been taking since 04/29/20. Pt has not been interested in trying albuterol inhaler for SOB.   Plan: Continue current medications; Refer to PCP for further eval of SOB/cough  Hypothyroidism   Lab Results  Component Value Date/Time   TSH 2.95 04/05/2020 11:52 AM   TSH 1.543 03/30/2019 04:28 PM   TSH 3.11 04/08/2018 08:12 AM   Patient has failed these meds in past: none Patient is currently controlled on the following medications:   Levothyroxine 50 mcg - 1 tablet daily  Plan: Continue current medications  GERD   Patient has failed these  meds in past: none reported Patient is currently controlled on the following medications:   Omeprazole 40 mg - 1 tablet daily before breakfast (started 04/29/20)  Reports no improvement in cough since starting PPI. Started per cardio.   Plan: Continue current therapy. Refer to PCP for further eval of SOB/cough  Reviewed medications and none likely contributory to pts current symptoms.  CCM follow up: as needed basis, pt may call   Debbora Dus, PharmD Clinical Pharmacist [facility] [phone]

## 2020-06-09 NOTE — Progress Notes (Signed)
I have collaborated with the care management provider regarding care management and care coordination activities outlined in this encounter and have reviewed this encounter including documentation in the note and care plan. I am certifying that I agree with the content of this note and encounter as supervising physician. Loura Pardon MD

## 2020-06-10 ENCOUNTER — Telehealth: Payer: Self-pay | Admitting: Family Medicine

## 2020-06-10 ENCOUNTER — Ambulatory Visit (INDEPENDENT_AMBULATORY_CARE_PROVIDER_SITE_OTHER): Payer: Medicare HMO | Admitting: Family Medicine

## 2020-06-10 ENCOUNTER — Encounter: Payer: Self-pay | Admitting: Family Medicine

## 2020-06-10 ENCOUNTER — Ambulatory Visit (INDEPENDENT_AMBULATORY_CARE_PROVIDER_SITE_OTHER)
Admission: RE | Admit: 2020-06-10 | Discharge: 2020-06-10 | Disposition: A | Discharge status: Home / Self Care | Payer: Medicare HMO | Source: Ambulatory Visit | Attending: Family Medicine | Admitting: Family Medicine

## 2020-06-10 ENCOUNTER — Other Ambulatory Visit: Payer: Self-pay

## 2020-06-10 VITALS — HR 73 | Temp 96.9°F | Ht 62.0 in | Wt 154.2 lb

## 2020-06-10 DIAGNOSIS — N1832 Chronic kidney disease, stage 3b: Secondary | ICD-10-CM

## 2020-06-10 DIAGNOSIS — R0609 Other forms of dyspnea: Secondary | ICD-10-CM

## 2020-06-10 DIAGNOSIS — R059 Cough, unspecified: Secondary | ICD-10-CM

## 2020-06-10 DIAGNOSIS — I509 Heart failure, unspecified: Secondary | ICD-10-CM | POA: Diagnosis not present

## 2020-06-10 DIAGNOSIS — Z23 Encounter for immunization: Secondary | ICD-10-CM | POA: Diagnosis not present

## 2020-06-10 DIAGNOSIS — R4182 Altered mental status, unspecified: Secondary | ICD-10-CM

## 2020-06-10 DIAGNOSIS — E1143 Type 2 diabetes mellitus with diabetic autonomic (poly)neuropathy: Secondary | ICD-10-CM | POA: Diagnosis not present

## 2020-06-10 DIAGNOSIS — D696 Thrombocytopenia, unspecified: Secondary | ICD-10-CM | POA: Diagnosis not present

## 2020-06-10 DIAGNOSIS — R0600 Dyspnea, unspecified: Secondary | ICD-10-CM

## 2020-06-10 DIAGNOSIS — J849 Interstitial pulmonary disease, unspecified: Secondary | ICD-10-CM

## 2020-06-10 DIAGNOSIS — R0602 Shortness of breath: Secondary | ICD-10-CM | POA: Diagnosis not present

## 2020-06-10 DIAGNOSIS — E038 Other specified hypothyroidism: Secondary | ICD-10-CM

## 2020-06-10 DIAGNOSIS — N390 Urinary tract infection, site not specified: Secondary | ICD-10-CM

## 2020-06-10 DIAGNOSIS — R300 Dysuria: Secondary | ICD-10-CM

## 2020-06-10 DIAGNOSIS — R9389 Abnormal findings on diagnostic imaging of other specified body structures: Secondary | ICD-10-CM

## 2020-06-10 LAB — COMPREHENSIVE METABOLIC PANEL
ALT: 22 U/L (ref 0–35)
AST: 39 U/L — ABNORMAL HIGH (ref 0–37)
Albumin: 2.9 g/dL — ABNORMAL LOW (ref 3.5–5.2)
Alkaline Phosphatase: 70 U/L (ref 39–117)
BUN: 27 mg/dL — ABNORMAL HIGH (ref 6–23)
CO2: 26 mEq/L (ref 19–32)
Calcium: 9 mg/dL (ref 8.4–10.5)
Chloride: 108 mEq/L (ref 96–112)
Creatinine, Ser: 1.56 mg/dL — ABNORMAL HIGH (ref 0.40–1.20)
GFR: 29.38 mL/min — ABNORMAL LOW (ref >60.00)
Glucose, Bld: 219 mg/dL — ABNORMAL HIGH (ref 70–99)
Potassium: 3.8 mEq/L (ref 3.5–5.1)
Sodium: 142 mEq/L (ref 135–145)
Total Bilirubin: 1 mg/dL (ref 0.2–1.2)
Total Protein: 6.8 g/dL (ref 6.0–8.3)

## 2020-06-10 LAB — POC URINALSYSI DIPSTICK (AUTOMATED)
Bilirubin, UA: NEGATIVE
Blood, UA: NEGATIVE
Glucose, UA: NEGATIVE
Ketones, UA: NEGATIVE
Leukocytes, UA: NEGATIVE
Nitrite, UA: NEGATIVE
Protein, UA: NEGATIVE
Spec Grav, UA: 1.02 (ref 1.010–1.025)
Urobilinogen, UA: 0.2 E.U./dL
pH, UA: 6 (ref 5.0–8.0)

## 2020-06-10 LAB — CBC WITH DIFFERENTIAL/PLATELET
Basophils Absolute: 0.1 10*3/uL (ref 0.0–0.1)
Basophils Relative: 1.1 % (ref 0.0–3.0)
Eosinophils Absolute: 0.3 10*3/uL (ref 0.0–0.7)
Eosinophils Relative: 4.5 % (ref 0.0–5.0)
HCT: 35.6 % — ABNORMAL LOW (ref 36.0–46.0)
Hemoglobin: 11.8 g/dL — ABNORMAL LOW (ref 12.0–15.0)
Lymphocytes Relative: 33.1 % (ref 12.0–46.0)
Lymphs Abs: 2.1 10*3/uL (ref 0.7–4.0)
MCHC: 33.2 g/dL (ref 30.0–36.0)
MCV: 99.5 fl (ref 78.0–100.0)
Monocytes Absolute: 0.4 10*3/uL (ref 0.1–1.0)
Monocytes Relative: 5.5 % (ref 3.0–12.0)
Neutro Abs: 3.6 10*3/uL (ref 1.4–7.7)
Neutrophils Relative %: 55.8 % (ref 43.0–77.0)
Platelets: 128 10*3/uL — ABNORMAL LOW (ref 150.0–400.0)
RBC: 3.58 Mil/uL — ABNORMAL LOW (ref 3.87–5.11)
RDW: 16.4 % — ABNORMAL HIGH (ref 11.5–15.5)
WBC: 6.5 10*3/uL (ref 4.0–10.5)

## 2020-06-10 LAB — TSH: TSH: 2.19 u[IU]/mL (ref 0.35–4.50)

## 2020-06-10 LAB — HEMOGLOBIN A1C: Hgb A1c MFr Bld: 7.9 % — ABNORMAL HIGH (ref 4.6–6.5)

## 2020-06-10 LAB — GLUCOSE, POCT (MANUAL RESULT ENTRY): POC Glucose: 269 mg/dl — AB (ref 70–99)

## 2020-06-10 NOTE — Telephone Encounter (Signed)
Please let pt/caregiver know that cxr has features of interstitial lung change  (this is consistent with what I heard on exam today) I want to get a CT chest to verify that  I did bring up the possibility of needing this today I pended the order- if agreeable send back and I will sign off

## 2020-06-10 NOTE — Assessment & Plan Note (Signed)
Sees hematology for MDS Cbc done today  More sob lately but no bruising or bleeding

## 2020-06-10 NOTE — Assessment & Plan Note (Signed)
Along with sob on exertion  A little improved today On ppi for hiatal hernia  cxr done

## 2020-06-10 NOTE — Assessment & Plan Note (Signed)
A1C today  Some ups and downs with glucose at home taking amaryl  Appetite fluctuates also (better today and ate sugar cereal)  On statin and arb

## 2020-06-10 NOTE — Assessment & Plan Note (Signed)
Lab today Recent uti with bactrim

## 2020-06-10 NOTE — Assessment & Plan Note (Signed)
This has worsened (along with intermittent cough) despite aggressive tx of CHF  There was a ? Of pneumonitis on last cxr -will repeat this today  No s/s of infection overall  Does desat going to the bathroom  Dry sounding crackles on exam

## 2020-06-10 NOTE — Assessment & Plan Note (Signed)
TSH today  Taking levothyroxine 50 mcg daily

## 2020-06-10 NOTE — Progress Notes (Addendum)
Subjective:    Patient ID: Stacey Harris, female    DOB: 02/07/1929, 86 y.o.   MRN: [id number]  This visit occurred during the SARS-CoV-2 public health emergency.  Safety protocols were in place, including screening questions prior to the visit, additional usage of staff PPE, and extensive cleaning of exam room while observing appropriate contact time as indicated for disinfecting solutions.    HPI Pt presents with ongoing cough and uti symptoms  Would like blood sugar checked   Wt Readings from Last 3 Encounters:  06/10/20 154 lb 4 oz (70 kg)  04/29/20 158 lb (71.7 kg)  04/12/20 159 lb 7 oz (72.3 kg)   28.21 kg/m  Ongoing cough with DOE and ? hypoixa  Yesterday -coughed less / only a little today  Very wet sounding and clear thick sputum  Drinks adequate fluids   No fever  Sob - that is getting worse  93% sitting  desat to walk (long walk to go to bathroom) -does use a walker     H/o CHF with fluid overload  Sees cardiology  Last visit given ppi for hiatal hernia   Last cxr DG Chest 2 View (Accession [id number]) (Order [id number]) Imaging Date: 04/05/2020 Department: [place] Released By: Ellamae Sia Authorizing: Lesleigh Noe, MD  Exam Status  Status  Final [99]  PACS Intelerad Image Link  Show images for DG Chest 2 View Study Result  Narrative & Impression  CLINICAL DATA:  Wheezing.  EXAM: CHEST - 2 VIEW  COMPARISON:  11/12/2017.  04/06/2009.  FINDINGS: Heart size stable. Diffuse bilateral interstitial prominence again noted. Findings consistent with an active interstitial process such as CHF and or pneumonitis. No pleural effusion or pneumothorax. Probable sliding hiatal hernia.  IMPRESSION: Diffuse bilateral interstitial prominence. Findings consistent with an active interstitial process such as CHF and or pneumonitis. Similar finding was noted on prior study of 11/12/2017.   Electronically Signed    By: Wonda Olds recent  Urine culture showed e coli pan sensitive and was tx with bactrim   Got better and then worse  Had some confusion/general malaise and dizziness Also strong urine odor Now clear- better overall today     Today  Results for orders placed or performed in visit on 06/10/20  POCT Urinalysis Dipstick (Automated)  Result Value Ref Range   Color, UA Yellow    Clarity, UA Clear    Glucose, UA Negative Negative   Bilirubin, UA Negative    Ketones, UA Negative    Spec Grav, UA 1.020 1.010 - 1.025   Blood, UA Negative    pH, UA 6.0 5.0 - 8.0   Protein, UA Negative Negative   Urobilinogen, UA 0.2 0.2 or 1.0 E.U./dL   Nitrite, UA Negative    Leukocytes, UA Negative Negative    Glucose random today 265  (ate sugar cereal)  Lab Results  Component Value Date   HGBA1C 6.4 07/31/2019  takes amaryl  On a statin and ARB     Also MDS and iron def  Lab Results  Component Value Date   WBC 6.3 04/05/2020   HGB 11.0 (L) 04/05/2020   HCT 33.2 (L) 04/05/2020   MCV 102.5 (H) 04/05/2020   PLT 121.0 (L) 04/05/2020   BP Readings from Last 3 Encounters:  04/29/20 (!) 142/70  04/12/20 131/70  04/05/20 138/68   Pulse Readings from Last 3 Encounters:  06/10/20 73  04/29/20 73  04/12/20 63   Patient Active Problem List   Diagnosis Date Noted  . Acute cystitis 05/25/2020  . Cough 05/25/2020  . CHF (congestive heart failure) ([place]) 04/12/2020  . Cerumen impaction 07/31/2019  . Iron deficiency 04/29/2019  . Carpal tunnel syndrome of left wrist   . Myelodysplastic syndrome ([place])   . H/O: CVA (cerebrovascular accident)   . Hypomagnesemia   . CKD (chronic kidney disease) 03/30/2019  . Recurrent UTI (urinary tract infection) 09/17/2018  . Fall at home 09/17/2018  . Frequent urination 04/29/2018  . Elevated serum creatinine 04/11/2018  . Dysuria 11/12/2017  . Dyspnea on exertion 11/12/2017  . History of fall 08/27/2016  . Controlled type 2 diabetes  mellitus with diabetic autonomic neuropathy ([place]) 02/24/2016  . Routine general medical examination at a health care facility 02/12/2016  . MDS (myelodysplastic syndrome) ([place]) 10/24/2015  . Normocytic anemia 02/11/2015  . GERD (gastroesophageal reflux disease) 08/06/2014  . Encounter for Medicare annual wellness exam 08/03/2013  . Thrombocytopenia ([place]) 08/03/2013  . Insomnia 01/26/2013  . Hypothyroid 09/17/2011  . Lumbar spinal stenosis 06/21/2011    Class: Present on Admission  . BACK PAIN, LUMBAR 08/23/2010  . POSTNASAL DRIP 08/23/2010  . OSTEOARTHRITIS, HIP 03/16/2009  . BASAL CELL CARCINOMA, FACE 07/01/2007  . BASAL CELL CARCINOMA, FACE 07/01/2007  . DIABETIC PERIPHERAL NEUROPATHY 09/24/2006  . Hyperlipidemia associated with type 2 diabetes mellitus ([place]) 09/24/2006  . Essential hypertension 09/24/2006  . ROSACEA 09/24/2006  . INCONTINENCE, URGE 09/24/2006  . COLONOSCOPY AND REMOVAL OF LESION, HX OF 06/09/2003   Past Medical History:  Diagnosis Date  . Blood transfusion   . Cancer ([place])    basal cell skin CA  . Diabetes mellitus    type II  . Frequent UTI   . Hyperlipidemia   . Hypertension   . Hypothyroidism   . Macular degeneration of both eyes   . OA (osteoarthritis)   . Stroke [facility]) 03/30/2019   Past Surgical History:  Procedure Laterality Date  . ANKLE FRACTURE SURGERY  10   rt  . CARPAL TUNNEL RELEASE Right 06/06/2017   Procedure: RIGHT CARPAL TUNNEL RELEASE;  Surgeon: Daryll Brod, MD;  Location: [facility];  Service: Orthopedics;  Laterality: Right;  . CATARACT EXTRACTION W/ INTRAOCULAR LENS  IMPLANT, BILATERAL Bilateral 2019  . JOINT REPLACEMENT Right 2010   rt total hip 10  . LUMBAR LAMINECTOMY/DECOMPRESSION MICRODISCECTOMY  06/22/2011   Procedure: LUMBAR LAMINECTOMY/DECOMPRESSION MICRODISCECTOMY;  Surgeon: Marybelle Killings;  Location: [facility];  Service: Orthopedics;  Laterality: N/A;  L3-4, L4-5 Decompression  . MOHS SURGERY  12/08   basal  cell skin cancer lt temple  . TAH      and BSO, age 19  . TONSILLECTOMY     Social History   Tobacco Use  . Smoking status: Never Smoker  . Smokeless tobacco: Never Used  Vaping Use  . Vaping Use: Never used  Substance Use Topics  . Alcohol use: No    Alcohol/week: 0.0 standard drinks  . Drug use: No   Family History  Problem Relation Age of Onset  . Arthritis Father        RA  . Hypertension Father   . Heart disease Father        CAD  . Diabetes Father   . Arthritis Sister        RA  . Diabetes Brother   . Cancer Brother   . Diabetes Brother   . Cancer Brother   .  Diabetes Brother   . Cancer Brother   . Diabetes Sister   . Cancer Brother    Allergies  Allergen Reactions  . Keflex [Cephalexin]     Did ok with lower dose, higher dose caused a rash    . Mavik [Trandolapril]     Cough    Current Outpatient Medications on File Prior to Visit  Medication Sig Dispense Refill  . acetaminophen (TYLENOL) 325 MG tablet Take 1-2 tablets (325-650 mg total) by mouth every 4 (four) hours as needed for mild pain.    [name] albuterol (VENTOLIN HFA) 108 (90 Base) MCG/ACT inhaler Inhale 2 puffs into the lungs every 4 (four) hours as needed for wheezing (cough). 1 each 3  . aspirin EC 81 MG EC tablet Take 1 tablet (81 mg total) by mouth daily.    . Cholecalciferol (VITAMIN D PO) Take 5,000 Units by mouth daily.    [name] CRANBERRY PO Take 2 tablets by mouth daily.     . furosemide (LASIX) 20 MG tablet TAKE 1 TABLET BY MOUTH EVERY DAY 30 tablet 1  . glimepiride (AMARYL) 4 MG tablet TAKE 1 TABLET BY MOUTH EVERY DAY BEFORE BREAKFAST 90 tablet 3  . levothyroxine (SYNTHROID) 50 MCG tablet Take 1 tablet (50 mcg total) by mouth daily. 90 tablet 3  . losartan (COZAAR) 25 MG tablet TAKE 2 TABLETS BY MOUTH EVERY DAY 180 tablet 1  . Multiple Vitamin (MULTIVITAMIN) capsule Take 1 capsule by mouth daily.    . Omega-3 Fatty Acids (FISH OIL) 1200 MG CAPS Take 1,200 mg by mouth daily.     [name] omeprazole  (PRILOSEC) 40 MG capsule Take 1 capsule (40 mg total) by mouth daily. 90 capsule 1  . rosuvastatin (CRESTOR) 10 MG tablet Take 1 tablet (10 mg total) by mouth daily. 90 tablet 3  . TRUE METRIX BLOOD GLUCOSE TEST test strip CHECK BLOOD SUGAR ONCE DAILY AND AS DIRECTED.DX E11.43 100 strip 1  . TRUEplus Lancets 30G MISC 1 EACH BY SUBDERMAL ROUTE DAILY. CHECK BLOOD SUGAR ONCE DAILY AND AS DIRECTED.DX E11.43 100 each 1   No current facility-administered medications on file prior to visit.      Review of Systems  Constitutional: Positive for fatigue. Negative for activity change, appetite change, fever and unexpected weight change.  HENT: Negative for congestion, ear pain, rhinorrhea, sinus pressure and sore throat.   Eyes: Negative for pain, redness and visual disturbance.  Respiratory: Positive for cough and shortness of breath. Negative for chest tightness, wheezing and stridor.   Cardiovascular: Positive for leg swelling. Negative for chest pain and palpitations.       Improved leg swelling   Gastrointestinal: Negative for abdominal pain, blood in stool, constipation and diarrhea.  Endocrine: Negative for polydipsia and polyuria.  Genitourinary: Negative for dysuria, frequency and urgency.  Musculoskeletal: Negative for arthralgias, back pain and myalgias.  Skin: Negative for pallor and rash.  Allergic/Immunologic: Negative for environmental allergies.  Neurological: Negative for dizziness, syncope and headaches.  Hematological: Negative for adenopathy. Does not bruise/bleed easily.       Intermittent confusion   Psychiatric/Behavioral: Positive for confusion. Negative for decreased concentration and dysphoric mood. The patient is not nervous/anxious.        Objective:   Physical Exam Constitutional:      General: She is not in acute distress.    Appearance: Normal appearance. She is well-developed. She is obese. She is not ill-appearing.  HENT:     Head: Normocephalic and  atraumatic.  Mouth/Throat:     Mouth: Mucous membranes are moist.     Pharynx: Oropharynx is clear. No posterior oropharyngeal erythema.  Eyes:     General: No scleral icterus.       Right eye: No discharge.        Left eye: No discharge.     Conjunctiva/sclera: Conjunctivae normal.     Pupils: Pupils are equal, round, and reactive to light.  Neck:     Thyroid: No thyromegaly.     Vascular: No carotid bruit or JVD.  Cardiovascular:     Rate and Rhythm: Normal rate and regular rhythm.     Heart sounds: Normal heart sounds. No murmur heard.  No gallop.   Pulmonary:     Effort: Pulmonary effort is normal. No respiratory distress.     Breath sounds: Normal breath sounds. No stridor. No wheezing, rhonchi or rales.     Comments: Dry sounding crackles throughout lungs  Exp phase is not prolonged  Chest:     Chest wall: No tenderness.  Abdominal:     General: Bowel sounds are normal. There is no distension or abdominal bruit.     Palpations: Abdomen is soft. There is no mass.     Tenderness: There is no abdominal tenderness. There is no right CVA tenderness, left CVA tenderness, guarding or rebound.  Musculoskeletal:     Cervical back: Normal range of motion and neck supple. No tenderness.     Comments: Puffy ankles , no pitting edema   Lymphadenopathy:     Cervical: No cervical adenopathy.  Skin:    General: Skin is warm and dry.     Coloration: Skin is not pale.     Findings: No erythema or rash.  Neurological:     Mental Status: She is alert.     Cranial Nerves: No cranial nerve deficit.     Deep Tendon Reflexes: Reflexes are normal and symmetric.  Psychiatric:        Mood and Affect: Mood normal.     Comments: Answers questions appropriately today  Attentive, alert and in no distress            Assessment & Plan:   Problem List Items Addressed This Visit      Cardiovascular and Mediastinum   CHF (congestive heart failure) ([place])    Reassuring cardiology eval   Per family and pt - diuresis helped legs but not sob and cough          Endocrine   Hypothyroid    TSH today  Taking levothyroxine 50 mcg daily      Relevant Orders   TSH   Controlled type 2 diabetes mellitus with diabetic autonomic neuropathy (H[facility])    A1C today  Some ups and downs with glucose at home taking amaryl  Appetite fluctuates also (better today and ate sugar cereal)  On statin and arb      Relevant Orders   Glucose (CBG) (Completed)   Hemoglobin A1c     Genitourinary   Recurrent UTI (urinary tract infection)    Recent tx of pan sensitive uti with bactrim ds  Pt started to develop malaise and confusion again (yesterday) but markedly improved today and ua is clear  Culture sent in light of history  She continues good fluid intake also      Relevant Orders   Urine Culture   CKD (chronic kidney disease)    Lab today Recent uti with bactrim      Relevant  Orders   Comprehensive metabolic panel     Other   Thrombocytopenia ([place])    Sees hematology for MDS Cbc done today  More sob lately but no bruising or bleeding       Relevant Orders   CBC with Differential/Platelet   Dyspnea on exertion - Primary    This has worsened (along with intermittent cough) despite aggressive tx of CHF  There was a ? Of pneumonitis on last cxr -will repeat this today  No s/s of infection overall  Does desat going to the bathroom  Dry sounding crackles on exam      Relevant Orders   DG Chest 2 View   Cough    Along with sob on exertion  A little improved today On ppi for hiatal hernia  cxr done       Relevant Orders   DG Chest 2 View    Other Visit Diagnoses    Altered mental status, unspecified altered mental status type       Relevant Orders   POCT Urinalysis Dipstick (Automated) (Completed)   Need for influenza vaccination       Relevant Orders   Flu Vaccine QUAD High Dose(Fluad) (Completed)

## 2020-06-10 NOTE — Assessment & Plan Note (Signed)
Reassuring cardiology eval  Per family and pt - diuresis helped legs but not sob and cough

## 2020-06-10 NOTE — Telephone Encounter (Signed)
Spoke with daughter [name] (main # on file) and advised her of xray results and Dr. Marliss Coots comments. Pt's family agrees with CT. They can go to either [place] or [place] to get it done. Daughter did advise me she will be out of town until Wednesday so will not be able to do it until then

## 2020-06-10 NOTE — Patient Instructions (Signed)
Let's get another chest xray for the cough and shortness of breath (?pneumonitis)   Continue the omeprazole for hiatal hernia and cough   Labs today as well as a urine culture (urinalyis looks clear)  We will update you as soon as we get results   Plan to follow

## 2020-06-10 NOTE — Assessment & Plan Note (Signed)
Recent tx of pan sensitive uti with bactrim ds  Pt started to develop malaise and confusion again (yesterday) but markedly improved today and ua is clear  Culture sent in light of history  She continues good fluid intake also

## 2020-06-10 NOTE — Telephone Encounter (Signed)
I sent the order  Will rout to [facility]  Note out of town until wednesday

## 2020-06-10 NOTE — Progress Notes (Signed)
Per orders of [place], MD, injection of High Dose Flu, given by Aneta Mins, RN. Patient tolerated injection well in L Deltoid.

## 2020-06-11 LAB — URINE CULTURE
MICRO NUMBER:: 11274727
SPECIMEN QUALITY:: ADEQUATE

## 2020-06-13 ENCOUNTER — Telehealth: Payer: Self-pay | Admitting: *Deleted

## 2020-06-13 ENCOUNTER — Ambulatory Visit: Payer: Medicare HMO | Admitting: Family Medicine

## 2020-06-13 NOTE — Telephone Encounter (Signed)
-----   Message from [name], MD sent at 06/12/2020  2:41 PM EST ----- A1c is up at 7.9 and kidney numbers are up slightly more than last lab  After chest CT and plan for breathing will discuss plan for diabetes  Do watch diet for sugar/sweets Blood count is stable No infection on urine culture

## 2020-06-13 NOTE — Telephone Encounter (Signed)
Left VM requesting pt's daughter [name] to call the office back

## 2020-06-14 ENCOUNTER — Encounter: Payer: Self-pay | Admitting: *Deleted

## 2020-06-14 NOTE — Telephone Encounter (Signed)
Copy of labs mailed to pt/family with PCPs comments

## 2020-06-16 ENCOUNTER — Other Ambulatory Visit: Payer: Self-pay | Admitting: Family Medicine

## 2020-06-16 DIAGNOSIS — I5032 Chronic diastolic (congestive) heart failure: Secondary | ICD-10-CM

## 2020-06-27 ENCOUNTER — Emergency Department (HOSPITAL_COMMUNITY): Payer: Medicare HMO

## 2020-06-27 ENCOUNTER — Other Ambulatory Visit: Payer: Self-pay

## 2020-06-27 ENCOUNTER — Other Ambulatory Visit (HOSPITAL_COMMUNITY): Payer: Medicare HMO

## 2020-06-27 ENCOUNTER — Inpatient Hospital Stay (HOSPITAL_COMMUNITY)
Admission: EM | Admit: 2020-06-27 | Discharge: 2020-07-01 | DRG: 291 | Disposition: A | Discharge status: Home Health | Payer: Medicare HMO | Attending: Internal Medicine | Admitting: Internal Medicine

## 2020-06-27 DIAGNOSIS — Z66 Do not resuscitate: Secondary | ICD-10-CM | POA: Diagnosis not present

## 2020-06-27 DIAGNOSIS — E872 Acidosis: Secondary | ICD-10-CM | POA: Diagnosis present

## 2020-06-27 DIAGNOSIS — K219 Gastro-esophageal reflux disease without esophagitis: Secondary | ICD-10-CM | POA: Diagnosis not present

## 2020-06-27 DIAGNOSIS — I5033 Acute on chronic diastolic (congestive) heart failure: Secondary | ICD-10-CM | POA: Diagnosis not present

## 2020-06-27 DIAGNOSIS — Z85828 Personal history of other malignant neoplasm of skin: Secondary | ICD-10-CM

## 2020-06-27 DIAGNOSIS — I82890 Acute embolism and thrombosis of other specified veins: Secondary | ICD-10-CM | POA: Diagnosis not present

## 2020-06-27 DIAGNOSIS — R918 Other nonspecific abnormal finding of lung field: Secondary | ICD-10-CM | POA: Diagnosis present

## 2020-06-27 DIAGNOSIS — I4510 Unspecified right bundle-branch block: Secondary | ICD-10-CM | POA: Diagnosis present

## 2020-06-27 DIAGNOSIS — Z888 Allergy status to other drugs, medicaments and biological substances status: Secondary | ICD-10-CM

## 2020-06-27 DIAGNOSIS — Z8249 Family history of ischemic heart disease and other diseases of the circulatory system: Secondary | ICD-10-CM

## 2020-06-27 DIAGNOSIS — E039 Hypothyroidism, unspecified: Secondary | ICD-10-CM | POA: Diagnosis present

## 2020-06-27 DIAGNOSIS — K7460 Unspecified cirrhosis of liver: Secondary | ICD-10-CM | POA: Diagnosis not present

## 2020-06-27 DIAGNOSIS — I8510 Secondary esophageal varices without bleeding: Secondary | ICD-10-CM | POA: Diagnosis not present

## 2020-06-27 DIAGNOSIS — Z20822 Contact with and (suspected) exposure to covid-19: Secondary | ICD-10-CM | POA: Diagnosis not present

## 2020-06-27 DIAGNOSIS — R0689 Other abnormalities of breathing: Secondary | ICD-10-CM | POA: Diagnosis not present

## 2020-06-27 DIAGNOSIS — K766 Portal hypertension: Secondary | ICD-10-CM | POA: Diagnosis present

## 2020-06-27 DIAGNOSIS — R778 Other specified abnormalities of plasma proteins: Secondary | ICD-10-CM | POA: Diagnosis not present

## 2020-06-27 DIAGNOSIS — N1832 Chronic kidney disease, stage 3b: Secondary | ICD-10-CM

## 2020-06-27 DIAGNOSIS — I340 Nonrheumatic mitral (valve) insufficiency: Secondary | ICD-10-CM | POA: Diagnosis not present

## 2020-06-27 DIAGNOSIS — I69354 Hemiplegia and hemiparesis following cerebral infarction affecting left non-dominant side: Secondary | ICD-10-CM

## 2020-06-27 DIAGNOSIS — K449 Diaphragmatic hernia without obstruction or gangrene: Secondary | ICD-10-CM | POA: Diagnosis not present

## 2020-06-27 DIAGNOSIS — N179 Acute kidney failure, unspecified: Secondary | ICD-10-CM | POA: Diagnosis present

## 2020-06-27 DIAGNOSIS — J189 Pneumonia, unspecified organism: Secondary | ICD-10-CM

## 2020-06-27 DIAGNOSIS — E038 Other specified hypothyroidism: Secondary | ICD-10-CM | POA: Diagnosis not present

## 2020-06-27 DIAGNOSIS — J9601 Acute respiratory failure with hypoxia: Secondary | ICD-10-CM | POA: Diagnosis present

## 2020-06-27 DIAGNOSIS — J849 Interstitial pulmonary disease, unspecified: Secondary | ICD-10-CM | POA: Diagnosis present

## 2020-06-27 DIAGNOSIS — R053 Chronic cough: Secondary | ICD-10-CM | POA: Diagnosis present

## 2020-06-27 DIAGNOSIS — K7469 Other cirrhosis of liver: Secondary | ICD-10-CM | POA: Diagnosis present

## 2020-06-27 DIAGNOSIS — E785 Hyperlipidemia, unspecified: Secondary | ICD-10-CM

## 2020-06-27 DIAGNOSIS — H3530 Unspecified macular degeneration: Secondary | ICD-10-CM | POA: Diagnosis present

## 2020-06-27 DIAGNOSIS — E1169 Type 2 diabetes mellitus with other specified complication: Secondary | ICD-10-CM | POA: Diagnosis present

## 2020-06-27 DIAGNOSIS — M1990 Unspecified osteoarthritis, unspecified site: Secondary | ICD-10-CM | POA: Diagnosis present

## 2020-06-27 DIAGNOSIS — Z961 Presence of intraocular lens: Secondary | ICD-10-CM | POA: Diagnosis present

## 2020-06-27 DIAGNOSIS — I81 Portal vein thrombosis: Secondary | ICD-10-CM | POA: Diagnosis not present

## 2020-06-27 DIAGNOSIS — Z8673 Personal history of transient ischemic attack (TIA), and cerebral infarction without residual deficits: Secondary | ICD-10-CM

## 2020-06-27 DIAGNOSIS — R9431 Abnormal electrocardiogram [ECG] [EKG]: Secondary | ICD-10-CM | POA: Diagnosis not present

## 2020-06-27 DIAGNOSIS — Z90710 Acquired absence of both cervix and uterus: Secondary | ICD-10-CM

## 2020-06-27 DIAGNOSIS — D469 Myelodysplastic syndrome, unspecified: Secondary | ICD-10-CM | POA: Diagnosis present

## 2020-06-27 DIAGNOSIS — E1122 Type 2 diabetes mellitus with diabetic chronic kidney disease: Secondary | ICD-10-CM | POA: Diagnosis present

## 2020-06-27 DIAGNOSIS — Z79899 Other long term (current) drug therapy: Secondary | ICD-10-CM

## 2020-06-27 DIAGNOSIS — I130 Hypertensive heart and chronic kidney disease with heart failure and stage 1 through stage 4 chronic kidney disease, or unspecified chronic kidney disease: Secondary | ICD-10-CM | POA: Diagnosis not present

## 2020-06-27 DIAGNOSIS — E1143 Type 2 diabetes mellitus with diabetic autonomic (poly)neuropathy: Secondary | ICD-10-CM | POA: Diagnosis present

## 2020-06-27 DIAGNOSIS — N189 Chronic kidney disease, unspecified: Secondary | ICD-10-CM | POA: Diagnosis present

## 2020-06-27 DIAGNOSIS — D696 Thrombocytopenia, unspecified: Secondary | ICD-10-CM | POA: Diagnosis present

## 2020-06-27 DIAGNOSIS — D649 Anemia, unspecified: Secondary | ICD-10-CM | POA: Diagnosis present

## 2020-06-27 DIAGNOSIS — Z9842 Cataract extraction status, left eye: Secondary | ICD-10-CM

## 2020-06-27 DIAGNOSIS — D509 Iron deficiency anemia, unspecified: Secondary | ICD-10-CM | POA: Diagnosis present

## 2020-06-27 DIAGNOSIS — I361 Nonrheumatic tricuspid (valve) insufficiency: Secondary | ICD-10-CM | POA: Diagnosis not present

## 2020-06-27 DIAGNOSIS — Z96641 Presence of right artificial hip joint: Secondary | ICD-10-CM

## 2020-06-27 DIAGNOSIS — R0902 Hypoxemia: Secondary | ICD-10-CM | POA: Diagnosis not present

## 2020-06-27 DIAGNOSIS — I491 Atrial premature depolarization: Secondary | ICD-10-CM | POA: Diagnosis not present

## 2020-06-27 DIAGNOSIS — R7989 Other specified abnormal findings of blood chemistry: Secondary | ICD-10-CM | POA: Diagnosis not present

## 2020-06-27 DIAGNOSIS — I517 Cardiomegaly: Secondary | ICD-10-CM | POA: Diagnosis not present

## 2020-06-27 DIAGNOSIS — R0602 Shortness of breath: Secondary | ICD-10-CM | POA: Diagnosis not present

## 2020-06-27 DIAGNOSIS — K760 Fatty (change of) liver, not elsewhere classified: Secondary | ICD-10-CM | POA: Diagnosis present

## 2020-06-27 DIAGNOSIS — Z90722 Acquired absence of ovaries, bilateral: Secondary | ICD-10-CM

## 2020-06-27 DIAGNOSIS — Z7722 Contact with and (suspected) exposure to environmental tobacco smoke (acute) (chronic): Secondary | ICD-10-CM | POA: Diagnosis present

## 2020-06-27 DIAGNOSIS — I10 Essential (primary) hypertension: Secondary | ICD-10-CM | POA: Diagnosis present

## 2020-06-27 DIAGNOSIS — Z79890 Hormone replacement therapy (postmenopausal): Secondary | ICD-10-CM

## 2020-06-27 DIAGNOSIS — Z8261 Family history of arthritis: Secondary | ICD-10-CM

## 2020-06-27 DIAGNOSIS — Z9841 Cataract extraction status, right eye: Secondary | ICD-10-CM

## 2020-06-27 DIAGNOSIS — I5031 Acute diastolic (congestive) heart failure: Secondary | ICD-10-CM | POA: Diagnosis not present

## 2020-06-27 DIAGNOSIS — K7689 Other specified diseases of liver: Secondary | ICD-10-CM | POA: Diagnosis not present

## 2020-06-27 DIAGNOSIS — R161 Splenomegaly, not elsewhere classified: Secondary | ICD-10-CM | POA: Diagnosis present

## 2020-06-27 DIAGNOSIS — Z7984 Long term (current) use of oral hypoglycemic drugs: Secondary | ICD-10-CM

## 2020-06-27 DIAGNOSIS — Z833 Family history of diabetes mellitus: Secondary | ICD-10-CM

## 2020-06-27 DIAGNOSIS — Z87440 Personal history of urinary (tract) infections: Secondary | ICD-10-CM

## 2020-06-27 DIAGNOSIS — I248 Other forms of acute ischemic heart disease: Secondary | ICD-10-CM | POA: Diagnosis present

## 2020-06-27 DIAGNOSIS — Z881 Allergy status to other antibiotic agents status: Secondary | ICD-10-CM

## 2020-06-27 DIAGNOSIS — Z7982 Long term (current) use of aspirin: Secondary | ICD-10-CM

## 2020-06-27 HISTORY — DX: Dyspnea, unspecified: R06.00

## 2020-06-27 LAB — BASIC METABOLIC PANEL
Anion gap: 14 (ref 5–15)
BUN: 20 mg/dL (ref 8–23)
CO2: 19 mmol/L — ABNORMAL LOW (ref 22–32)
Calcium: 8.5 mg/dL — ABNORMAL LOW (ref 8.9–10.3)
Chloride: 107 mmol/L (ref 98–111)
Creatinine, Ser: 1.48 mg/dL — ABNORMAL HIGH (ref 0.44–1.00)
GFR, Estimated: 34 mL/min — ABNORMAL LOW (ref >60)
Glucose, Bld: 375 mg/dL — ABNORMAL HIGH (ref 70–99)
Potassium: 3.8 mmol/L (ref 3.5–5.1)
Sodium: 140 mmol/L (ref 135–145)

## 2020-06-27 LAB — CBC WITH DIFFERENTIAL/PLATELET
Abs Immature Granulocytes: 0.02 10*3/uL (ref 0.00–0.07)
Basophils Absolute: 0.1 10*3/uL (ref 0.0–0.1)
Basophils Relative: 1 %
Eosinophils Absolute: 0.1 10*3/uL (ref 0.0–0.5)
Eosinophils Relative: 2 %
HCT: 35.2 % — ABNORMAL LOW (ref 36.0–46.0)
Hemoglobin: 10.9 g/dL — ABNORMAL LOW (ref 12.0–15.0)
Immature Granulocytes: 0 %
Lymphocytes Relative: 16 %
Lymphs Abs: 0.9 10*3/uL (ref 0.7–4.0)
MCH: 32.2 pg (ref 26.0–34.0)
MCHC: 31 g/dL (ref 30.0–36.0)
MCV: 104.1 fL — ABNORMAL HIGH (ref 80.0–100.0)
Monocytes Absolute: 0.5 10*3/uL (ref 0.1–1.0)
Monocytes Relative: 9 %
Neutro Abs: 4 10*3/uL (ref 1.7–7.7)
Neutrophils Relative %: 72 %
Platelets: 81 10*3/uL — ABNORMAL LOW (ref 150–400)
RBC: 3.38 MIL/uL — ABNORMAL LOW (ref 3.87–5.11)
RDW: 16.7 % — ABNORMAL HIGH (ref 11.5–15.5)
WBC: 5.6 10*3/uL (ref 4.0–10.5)
nRBC: 0 % (ref 0.0–0.2)

## 2020-06-27 LAB — URINALYSIS, ROUTINE W REFLEX MICROSCOPIC
Bacteria, UA: NONE SEEN
Bilirubin Urine: NEGATIVE
Glucose, UA: 150 mg/dL — AB
Hgb urine dipstick: NEGATIVE
Ketones, ur: NEGATIVE mg/dL
Nitrite: NEGATIVE
Protein, ur: NEGATIVE mg/dL
Specific Gravity, Urine: 1.034 — ABNORMAL HIGH (ref 1.005–1.030)
pH: 7 (ref 5.0–8.0)

## 2020-06-27 LAB — I-STAT VENOUS BLOOD GAS, ED
Acid-Base Excess: 4 mmol/L — ABNORMAL HIGH (ref 0.0–2.0)
Bicarbonate: 27.9 mmol/L (ref 20.0–28.0)
Calcium, Ion: 1.17 mmol/L (ref 1.15–1.40)
HCT: 28 % — ABNORMAL LOW (ref 36.0–46.0)
Hemoglobin: 9.5 g/dL — ABNORMAL LOW (ref 12.0–15.0)
O2 Saturation: 99 %
Potassium: 3.6 mmol/L (ref 3.5–5.1)
Sodium: 144 mmol/L (ref 135–145)
TCO2: 29 mmol/L (ref 22–32)
pCO2, Ven: 38.3 mmHg — ABNORMAL LOW (ref 44.0–60.0)
pH, Ven: 7.47 — ABNORMAL HIGH (ref 7.250–7.430)
pO2, Ven: 125 mmHg — ABNORMAL HIGH (ref 32.0–45.0)

## 2020-06-27 LAB — LACTIC ACID, PLASMA
Lactic Acid, Venous: 4 mmol/L (ref 0.5–1.9)
Lactic Acid, Venous: 2.8 mmol/L (ref 0.5–1.9)

## 2020-06-27 LAB — TROPONIN I (HIGH SENSITIVITY)
Troponin I (High Sensitivity): 34 ng/L — ABNORMAL HIGH (ref <18)
Troponin I (High Sensitivity): 37 ng/L — ABNORMAL HIGH (ref <18)
Troponin I (High Sensitivity): 43 ng/L — ABNORMAL HIGH (ref <18)

## 2020-06-27 LAB — D-DIMER, QUANTITATIVE: D-Dimer, Quant: 2.86 ug/mL-FEU — ABNORMAL HIGH (ref 0.00–0.50)

## 2020-06-27 LAB — SEDIMENTATION RATE: Sed Rate: 95 mm/hr — ABNORMAL HIGH (ref 0–22)

## 2020-06-27 LAB — HEMOGLOBIN A1C
Hgb A1c MFr Bld: 7.9 % — ABNORMAL HIGH (ref 4.8–5.6)
Mean Plasma Glucose: 180.03 mg/dL

## 2020-06-27 LAB — RESP PANEL BY RT-PCR (FLU A&B, COVID) ARPGX2
Influenza A by PCR: NEGATIVE
Influenza B by PCR: NEGATIVE
SARS Coronavirus 2 by RT PCR: NEGATIVE

## 2020-06-27 LAB — C-REACTIVE PROTEIN: CRP: 4.7 mg/dL — ABNORMAL HIGH (ref <1.0)

## 2020-06-27 LAB — BRAIN NATRIURETIC PEPTIDE: B Natriuretic Peptide: 197 pg/mL — ABNORMAL HIGH (ref 0.0–100.0)

## 2020-06-27 LAB — MAGNESIUM: Magnesium: 1.4 mg/dL — ABNORMAL LOW (ref 1.7–2.4)

## 2020-06-27 LAB — HEPATIC FUNCTION PANEL
ALT: 23 U/L (ref 0–44)
AST: 45 U/L — ABNORMAL HIGH (ref 15–41)
Albumin: 2 g/dL — ABNORMAL LOW (ref 3.5–5.0)
Alkaline Phosphatase: 67 U/L (ref 38–126)
Bilirubin, Direct: 0.2 mg/dL (ref 0.0–0.2)
Indirect Bilirubin: 0.9 mg/dL (ref 0.3–0.9)
Total Bilirubin: 1.1 mg/dL (ref 0.3–1.2)
Total Protein: 6.4 g/dL — ABNORMAL LOW (ref 6.5–8.1)

## 2020-06-27 LAB — CBG MONITORING, ED: Glucose-Capillary: 161 mg/dL — ABNORMAL HIGH (ref 70–99)

## 2020-06-27 LAB — FERRITIN: Ferritin: 152 ng/mL (ref 11–307)

## 2020-06-27 LAB — CK: Total CK: 42 U/L (ref 38–234)

## 2020-06-27 LAB — LACTATE DEHYDROGENASE: LDH: 310 U/L — ABNORMAL HIGH (ref 98–192)

## 2020-06-27 MED ORDER — DEXTROMETHORPHAN POLISTIREX ER 30 MG/5ML PO SUER
60.0000 mg | Freq: Every evening | ORAL | Status: DC | PRN
  Administered 2020-06-30 (×2): 60 mg via ORAL
  Filled 2020-06-27 (×4): qty 10

## 2020-06-27 MED ORDER — PANTOPRAZOLE SODIUM 40 MG PO TBEC
40.0000 mg | DELAYED_RELEASE_TABLET | Freq: Every day | ORAL | Status: DC
  Administered 2020-06-28 – 2020-07-01 (×4): 40 mg via ORAL
  Filled 2020-06-27 (×4): qty 1

## 2020-06-27 MED ORDER — ACETAMINOPHEN 650 MG RE SUPP: 650.0000 mg | Freq: Four times a day (QID) | RECTAL | Status: DC | PRN

## 2020-06-27 MED ORDER — LEVOTHYROXINE SODIUM 50 MCG PO TABS: 50.0000 ug | ORAL_TABLET | Freq: Every day | ORAL | Status: DC

## 2020-06-27 MED ORDER — ENOXAPARIN SODIUM 30 MG/0.3ML ~~LOC~~ SOLN
30.0000 mg | Freq: Every day | SUBCUTANEOUS | Status: DC
  Filled 2020-06-27: qty 0.3

## 2020-06-27 MED ORDER — HYDROCODONE-ACETAMINOPHEN 5-325 MG PO TABS: 1.0000 | ORAL_TABLET | ORAL | Status: DC | PRN

## 2020-06-27 MED ORDER — METHYLPREDNISOLONE SODIUM SUCC 125 MG IJ SOLR
125.0000 mg | Freq: Once | INTRAMUSCULAR | Status: AC
  Administered 2020-06-27: 19:00:00 125 mg via INTRAVENOUS
  Filled 2020-06-27: qty 2

## 2020-06-27 MED ORDER — SODIUM CHLORIDE 0.9 % IV SOLN
100.0000 mg | Freq: Two times a day (BID) | INTRAVENOUS | Status: DC
  Filled 2020-06-27 (×3): qty 100

## 2020-06-27 MED ORDER — SODIUM CHLORIDE 0.9 % IV BOLUS
500.0000 mL | Freq: Once | INTRAVENOUS | Status: AC
  Administered 2020-06-27: 16:00:00 500 mL via INTRAVENOUS

## 2020-06-27 MED ORDER — SODIUM CHLORIDE 0.9 % IV SOLN: 250.0000 mL | INTRAVENOUS | Status: DC | PRN

## 2020-06-27 MED ORDER — SODIUM CHLORIDE 0.9% FLUSH
3.0000 mL | Freq: Two times a day (BID) | INTRAVENOUS | Status: DC
  Administered 2020-06-27 – 2020-07-01 (×8): 3 mL via INTRAVENOUS

## 2020-06-27 MED ORDER — IOHEXOL 350 MG/ML SOLN
60.0000 mL | Freq: Once | INTRAVENOUS | Status: AC | PRN
  Administered 2020-06-27: 60 mL via INTRAVENOUS

## 2020-06-27 MED ORDER — ROSUVASTATIN CALCIUM 5 MG PO TABS
10.0000 mg | ORAL_TABLET | Freq: Every day | ORAL | Status: DC
  Administered 2020-06-28 – 2020-07-01 (×4): 10 mg via ORAL
  Filled 2020-06-27 (×4): qty 2

## 2020-06-27 MED ORDER — MAGNESIUM SULFATE 2 GM/50ML IV SOLN
2.0000 g | Freq: Once | INTRAVENOUS | Status: AC
  Administered 2020-06-27: 2 g via INTRAVENOUS
  Filled 2020-06-27: qty 50

## 2020-06-27 MED ORDER — ALBUTEROL SULFATE HFA 108 (90 BASE) MCG/ACT IN AERS
2.0000 | INHALATION_SPRAY | RESPIRATORY_TRACT | Status: DC | PRN
  Filled 2020-06-27: qty 6.7

## 2020-06-27 MED ORDER — LEVOTHYROXINE SODIUM 50 MCG PO TABS
50.0000 ug | ORAL_TABLET | Freq: Every day | ORAL | Status: DC
  Administered 2020-06-28 – 2020-07-01 (×4): 50 ug via ORAL
  Filled 2020-06-27 (×5): qty 1

## 2020-06-27 MED ORDER — ACETAMINOPHEN 325 MG PO TABS: 650.0000 mg | ORAL_TABLET | Freq: Four times a day (QID) | ORAL | Status: DC | PRN

## 2020-06-27 MED ORDER — LEVOFLOXACIN IN D5W 750 MG/150ML IV SOLN
750.0000 mg | Freq: Once | INTRAVENOUS | Status: AC
  Administered 2020-06-27: 15:00:00 750 mg via INTRAVENOUS
  Filled 2020-06-27: qty 150

## 2020-06-27 MED ORDER — SODIUM CHLORIDE 0.9% FLUSH
3.0000 mL | INTRAVENOUS | Status: DC | PRN
  Administered 2020-06-28: 07:00:00 3 mL via INTRAVENOUS

## 2020-06-27 MED ORDER — INSULIN ASPART 100 UNIT/ML ~~LOC~~ SOLN
0.0000 [IU] | SUBCUTANEOUS | Status: DC
  Administered 2020-06-28: 9 [IU] via SUBCUTANEOUS
  Administered 2020-06-28: 5 [IU] via SUBCUTANEOUS
  Administered 2020-06-28 (×2): 3 [IU] via SUBCUTANEOUS
  Administered 2020-06-28: 01:00:00 5 [IU] via SUBCUTANEOUS
  Administered 2020-06-29: 2 [IU] via SUBCUTANEOUS
  Administered 2020-06-29: 7 [IU] via SUBCUTANEOUS
  Administered 2020-06-29: 2 [IU] via SUBCUTANEOUS
  Administered 2020-06-29: 9 [IU] via SUBCUTANEOUS
  Administered 2020-06-29 (×2): 2 [IU] via SUBCUTANEOUS
  Administered 2020-06-29: 9 [IU] via SUBCUTANEOUS
  Administered 2020-06-30: 3 [IU] via SUBCUTANEOUS
  Administered 2020-06-30: 2 [IU] via SUBCUTANEOUS
  Administered 2020-06-30 (×2): 7 [IU] via SUBCUTANEOUS
  Administered 2020-07-01: 2 [IU] via SUBCUTANEOUS
  Administered 2020-07-01: 3 [IU] via SUBCUTANEOUS
  Administered 2020-07-01: 2 [IU] via SUBCUTANEOUS
  Administered 2020-07-01: 1 [IU] via SUBCUTANEOUS

## 2020-06-27 MED ORDER — METHYLPREDNISOLONE SODIUM SUCC 40 MG IJ SOLR
40.0000 mg | Freq: Four times a day (QID) | INTRAMUSCULAR | Status: DC
  Administered 2020-06-28 (×2): 40 mg via INTRAVENOUS
  Filled 2020-06-27 (×2): qty 1

## 2020-06-27 NOTE — ED Provider Notes (Signed)
Patient signed out to me by Dr. Almyra Free at shift change.  Briefly this is an 86 year old female who presented to the emergency department with shortness of breath and hypoxia with new oxygen requirement of 3 L nasal cannula but stable on this new requirement.  Plan was to rule out pulmonary embolism and admit. Physical Exam  BP 138/74   Pulse 77   Temp 98.9 F (37.2 C) (Oral)   Resp (!) 24   SpO2 98%   Physical Exam  ED Course/Procedures     Procedures  MDM  CTA is limited but does not show any central pulmonary embolus.  It does reiterate pulmonary fibrotic changes with possible edema versus infectious process.  Patient has already been given antibiotics, cultures were taken.  Vitals are now stable, oxygenation is stable on the 3 L nasal cannula.  Patient will require admission for further evaluation and treatment.      Lorelle Gibbs, DO 06/27/20 1930

## 2020-06-27 NOTE — ED Provider Notes (Signed)
[facility] EMERGENCY DEPARTMENT Provider Note   CSN: [id number] Arrival date & time: 06/27/20  1117     History No chief complaint on file.   Stacey Harris is a 86 y.o. female.  Patient presents with complaint of progressive cough generalized fatigue shortness of breath.  Symptoms been ongoing for about a week.  Denies any fevers or vomiting or diarrhea.  She otherwise has a history of diabetes and lives at home with her daughter.  Denies any pain at this time.         Past Medical History:  Diagnosis Date   Blood transfusion    Cancer ([place])    basal cell skin CA   Diabetes mellitus    type II   Frequent UTI    Hyperlipidemia    Hypertension    Hypothyroidism    Macular degeneration of both eyes    OA (osteoarthritis)    Stroke ([place]) 03/30/2019    Patient Active Problem List   Diagnosis Date Noted   Abnormal chest x-ray 06/10/2020   Acute cystitis 05/25/2020   Cough 05/25/2020   CHF (congestive heart failure) ([place]) 04/12/2020   Cerumen impaction 07/31/2019   Iron deficiency 04/29/2019   Carpal tunnel syndrome of left wrist    Myelodysplastic syndrome ([place])    H/O: CVA (cerebrovascular accident)    Hypomagnesemia    CKD (chronic kidney disease) 03/30/2019   Recurrent UTI (urinary tract infection) 09/17/2018   Fall at home 09/17/2018   Frequent urination 04/29/2018   Elevated serum creatinine 04/11/2018   Dysuria 11/12/2017   Dyspnea on exertion 11/12/2017   History of fall 08/27/2016   Controlled type 2 diabetes mellitus with diabetic autonomic neuropathy ([place]) 02/24/2016   Routine general medical examination at a health care facility 02/12/2016   MDS (myelodysplastic syndrome) ([place]) 10/24/2015   Normocytic anemia 02/11/2015   GERD (gastroesophageal reflux disease) 08/06/2014   Encounter for Medicare annual wellness exam 08/03/2013   Thrombocytopenia ([place]) 08/03/2013   Insomnia 01/26/2013    Hypothyroid 09/17/2011   Lumbar spinal stenosis 06/21/2011    Class: Present on Admission   BACK PAIN, LUMBAR 08/23/2010   POSTNASAL DRIP 08/23/2010   OSTEOARTHRITIS, HIP 03/16/2009   BASAL CELL CARCINOMA, FACE 07/01/2007   BASAL CELL CARCINOMA, FACE 07/01/2007   DIABETIC PERIPHERAL NEUROPATHY 09/24/2006   Hyperlipidemia associated with type 2 diabetes mellitus ([place]) 09/24/2006   Essential hypertension 09/24/2006   ROSACEA 09/24/2006   INCONTINENCE, URGE 09/24/2006   COLONOSCOPY AND REMOVAL OF LESION, HX OF 06/09/2003    Past Surgical History:  Procedure Laterality Date   ANKLE FRACTURE SURGERY  10   rt   CARPAL TUNNEL RELEASE Right 06/06/2017   Procedure: RIGHT CARPAL TUNNEL RELEASE;  Surgeon: Daryll Brod, MD;  Location: [facility];  Service: Orthopedics;  Laterality: Right;   CATARACT EXTRACTION W/ INTRAOCULAR LENS  IMPLANT, BILATERAL Bilateral 2019   JOINT REPLACEMENT Right 2010   rt total hip 10   LUMBAR LAMINECTOMY/DECOMPRESSION MICRODISCECTOMY  06/22/2011   Procedure: LUMBAR LAMINECTOMY/DECOMPRESSION MICRODISCECTOMY;  Surgeon: Marybelle Killings;  Location: [facility];  Service: Orthopedics;  Laterality: N/A;  L3-4, L4-5 Decompression   MOHS SURGERY  12/08   basal cell skin cancer lt temple   TAH      and BSO, age 78   TONSILLECTOMY       OB History   No obstetric history on file.     Family History  Problem Relation Age of Onset   Arthritis  Father        RA   Hypertension Father    Heart disease Father        CAD   Diabetes Father    Arthritis Sister        RA   Diabetes Brother    Cancer Brother    Diabetes Brother    Cancer Brother    Diabetes Brother    Cancer Brother    Diabetes Sister    Cancer Brother     Social History   Tobacco Use   Smoking status: Never Smoker   Smokeless tobacco: Never Used  Scientific laboratory technician Use: Never used  Substance Use Topics   Alcohol use: No    Alcohol/week: 0.0  standard drinks   Drug use: No    Home Medications Prior to Admission medications   Medication Sig Start Date End Date Taking? Authorizing Provider  acetaminophen (TYLENOL) 325 MG tablet Take 1-2 tablets (325-650 mg total) by mouth every 4 (four) hours as needed for mild pain. 04/17/19  Yes Love, Ivan Anchors, PA-C  albuterol (VENTOLIN HFA) 108 (90 Base) MCG/ACT inhaler Inhale 2 puffs into the lungs every 4 (four) hours as needed for wheezing (cough). 05/25/20  Yes Tower, Wynelle [name], MD  aspirin EC 81 MG EC tablet Take 1 tablet (81 mg total) by mouth daily. 04/03/19  Yes Aline August, MD  Cholecalciferol (VITAMIN D PO) Take 5,000 Units by mouth daily.   Yes [provider]  CRANBERRY PO Take 2 tablets by mouth daily.    Yes [provider]  dextromethorphan (DELSYM) 30 MG/5ML liquid Take 60 mg by mouth at bedtime as needed for cough. 50ml   Yes [provider]  furosemide (LASIX) 20 MG tablet TAKE 1 TABLET BY MOUTH EVERY DAY Patient taking differently: Take 20 mg by mouth daily. 06/17/20  Yes Tower, Marne A, MD  glimepiride (AMARYL) 4 MG tablet TAKE 1 TABLET BY MOUTH EVERY DAY BEFORE BREAKFAST Patient taking differently: Take 4 mg by mouth daily with breakfast. 04/12/20  Yes Tower, Wynelle [name], MD  levothyroxine (SYNTHROID) 50 MCG tablet Take 1 tablet (50 mcg total) by mouth daily. 04/12/20  Yes Tower, Wynelle [name], MD  losartan (COZAAR) 25 MG tablet TAKE 2 TABLETS BY MOUTH EVERY DAY Patient taking differently: Take 25 mg by mouth daily. 01/28/20  Yes Tower, Wynelle [name], MD  Multiple Vitamin (MULTIVITAMIN) capsule Take 1 capsule by mouth daily.   Yes [provider]  Omega-3 Fatty Acids (FISH OIL) 1200 MG CAPS Take 1,200 mg by mouth daily.   Yes [provider]  omeprazole (PRILOSEC) 40 MG capsule Take 1 capsule (40 mg total) by mouth daily. 04/29/20  Yes Gollan, Kathlene November, MD  rosuvastatin (CRESTOR) 10 MG tablet Take 1 tablet (10 mg total) by mouth daily. 04/12/20  Yes  Tower, Wynelle [name], MD  TRUE METRIX BLOOD GLUCOSE TEST test strip CHECK BLOOD SUGAR ONCE DAILY AND AS DIRECTED.DX E11.43 06/07/20   Tower, Wynelle [name], MD  TRUEplus Lancets 30G MISC 1 EACH BY SUBDERMAL ROUTE DAILY. CHECK BLOOD SUGAR ONCE DAILY AND AS DIRECTED.DX E11.43 10/08/19   Tower, Wynelle [name], MD    Allergies    Keflex [cephalexin] and Mavik [trandolapril]  Review of Systems   Review of Systems  Constitutional: Negative for fever.  HENT: Negative for ear pain.   Eyes: Negative for pain.  Respiratory: Positive for cough.   Cardiovascular: Negative for chest pain.  Gastrointestinal: Negative for abdominal pain.  Genitourinary: Negative for flank pain.  Musculoskeletal: Negative for back pain.  Skin: Negative for rash.  Neurological: Negative for headaches.    Physical Exam Updated Vital Signs BP (!) 147/89    Pulse (!) 43    Temp 98.9 F (37.2 C) (Oral)    Resp 20    SpO2 98%   Physical Exam Constitutional:      General: She is not in acute distress.    Appearance: Normal appearance.  HENT:     Head: Normocephalic.     Nose: Nose normal.  Eyes:     Extraocular Movements: Extraocular movements intact.  Cardiovascular:     Rate and Rhythm: Normal rate.  Pulmonary:     Effort: Respiratory distress present.     Breath sounds: Wheezing present.  Abdominal:     Palpations: Abdomen is soft.     Tenderness: There is no abdominal tenderness.  Musculoskeletal:        General: Normal range of motion.     Cervical back: Normal range of motion.  Skin:    General: Skin is warm.  Neurological:     General: No focal deficit present.     Mental Status: She is alert.     ED Results / Procedures / Treatments   Labs (all labs ordered are listed, but only abnormal results are displayed) Labs Reviewed  CBC WITH DIFFERENTIAL/PLATELET - Abnormal; Notable for the following components:      Result Value   RBC 3.38 (*)    Hemoglobin 10.9 (*)    HCT 35.2 (*)    MCV 104.1 (*)    RDW 16.7  (*)    Platelets 81 (*)    All other components within normal limits  BASIC METABOLIC PANEL - Abnormal; Notable for the following components:   CO2 19 (*)    Glucose, Bld 375 (*)    Creatinine, Ser 1.48 (*)    Calcium 8.5 (*)    GFR, Estimated 34 (*)    All other components within normal limits  LACTIC ACID, PLASMA - Abnormal; Notable for the following components:   Lactic Acid, Venous 4.0 (*)    All other components within normal limits  LACTIC ACID, PLASMA - Abnormal; Notable for the following components:   Lactic Acid, Venous 2.8 (*)    All other components within normal limits  D-DIMER, QUANTITATIVE (NOT AT [facility]) - Abnormal; Notable for the following components:   D-Dimer, Quant 2.86 (*)    All other components within normal limits  BRAIN NATRIURETIC PEPTIDE - Abnormal; Notable for the following components:   B Natriuretic Peptide 197.0 (*)    All other components within normal limits  TROPONIN I (HIGH SENSITIVITY) - Abnormal; Notable for the following components:   Troponin I (High Sensitivity) 34 (*)    All other components within normal limits  TROPONIN I (HIGH SENSITIVITY) - Abnormal; Notable for the following components:   Troponin I (High Sensitivity) 37 (*)    All other components within normal limits  CULTURE, BLOOD (ROUTINE X 2)  CULTURE, BLOOD (ROUTINE X 2)  RESP PANEL BY RT-PCR (FLU A&B, COVID) [id number]    EKG EKG Interpretation  Date/Time:  Monday June 27 2020 11:35:23 EST Ventricular Rate:  64 PR Interval:    QRS Duration: 133 QT Interval:  388 QTC Calculation: 401 R Axis:   -66 Text Interpretation: Sinus tachycardia Atrial premature complexes Right bundle branch block Inferior infarct, age indeterminate Lateral leads are also involved Artifact in lead(s) I  II III aVR aVL aVF Confirmed by Thamas Jaegers ([id number]) on 06/27/2020 11:38:03 AM   Radiology DG Chest 2 View  Result Date: 06/27/2020 CLINICAL DATA:  Shortness of breath EXAM: CHEST - 2 VIEW  COMPARISON:  June 10, 2020 and April 05, 2020 FINDINGS: There is persistent elevation of the right hemidiaphragm. Widespread interstitial opacity persists, likely in large part representing fibrosis. No appreciable new opacity compared to most recent study. Heart is slightly enlarged with pulmonary vascularity normal. No adenopathy. No bone lesions. IMPRESSION: Suspected widespread fibrosis, grossly stable. A degree of underlying pulmonary edema and/or interstitial pneumonia difficult to exclude in this circumstance. Stable cardiac silhouette. Stable elevation right hemidiaphragm. Electronically Signed   By: Lowella Grip III M.D.   On: 06/27/2020 12:23    Procedures .Critical Care Performed by: Luna Fuse, MD Authorized by: Luna Fuse, MD   Critical care provider statement:    Critical care time (minutes):  40   Critical care time was exclusive of:  Separately billable procedures and treating other patients   Critical care was necessary to treat or prevent imminent or life-threatening deterioration of the following conditions:  Respiratory failure   (including critical care time)  Medications Ordered in ED Medications  levofloxacin (LEVAQUIN) IVPB 750 mg (750 mg Intravenous New Bag/Given 06/27/20 1509)  sodium chloride 0.9 % bolus 500 mL (500 mLs Intravenous New Bag/Given 06/27/20 1540)    ED Course  I have reviewed the triage vital signs and the nursing notes.  Pertinent labs & imaging results that were available during my care of the patient were reviewed by me and considered in my medical decision making (see chart for details).    MDM Rules/Calculators/A&P                          Patient is hypoxic to about 85% on room air.  She is doing well at 3 L nasal cannula satting 97% which is appropriate.  Initial lactic acid returned at 4, however she has worsening pitting edema and history of CHF, therefore 30 cc/kg fluid bolus was not given due to concern that this  may cause worsening respiratory failure and fluid overload.  Repeat lactate appears improved at 2.8.  Patient started on broad-spectrum antibiotics and blood cultures sent.  D-dimer is elevated, CT angio pursued and pending final result Covid panel also sent and pending results.  Patient will need to be admitted for new onset hypoxemia possibly due to underlying lung disease as well as possible new onset infectious cause.  Final CT imaging results and next oncoming provider.   Final Clinical Impression(s) / ED Diagnoses Final diagnoses:  Hypoxemia    Rx / DC Orders ED Discharge Orders    None       Luna Fuse, MD 06/27/20 [id number]

## 2020-06-27 NOTE — H&P (Addendum)
Stacey Harris WJX:[id number] DOB: 03/15/1929 DOA: 06/27/2020    PCP: [name], MD   Outpatient Specialists:   CARDS: Dr.Gollan  Oncology  Not sure who    Patient arrived to ER on 06/27/20 at 1117 Referred by Attending Horton, Alvin Critchley, DO   Patient coming from: home Lives With family    Chief Complaint:  Fatigue and shortness of breath   HPI: Stacey Harris is a 86 y.o. female with medical history significant of diabetes, frequent UTIs, HLD, HTN, hypothyroidism, history of stroke, iron deficiency, CHF, CKD, thrombocytopenia, GERD, MDS    Presented with progressive cough generalized fatigue shortness of breath for about 1 week no fever no vomiting or diarrhea  prior CXR showing possible interstitial lung disease As per records patient have had progressive dyspnea on exertion for few months now. She has been having chronic cough She came in today because she has been doing worse pulse ox at home have been dropping to 60%  Her family recently had a cold Second hand smoke exposure  Infectious risk factors:  Reports shortness of breath, dry cough    Has  been vaccinated against COVID not boosted   Initial COVID TEST  NEGATIVE   Lab Results  Component Value Date   SARSCOV2NAA NEGATIVE 06/27/2020   [place] NEGATIVE 03/30/2019    Regarding pertinent Chronic problems:     Hyperlipidemia -  on statins crestor Lipid Panel     Component Value Date/Time   CHOL 132 07/31/2019 1214   TRIG 114.0 07/31/2019 1214   HDL 28.00 (L) 07/31/2019 1214   CHOLHDL 5 07/31/2019 1214   VLDL 22.8 07/31/2019 1214   Capac 81 07/31/2019 1214   LDLDIRECT 202.7 08/17/2010 0936     HTN on Cozaar   - last echo September 2020 preserved EF On Lasix qd for chronic leg swelling      DM 2 -  Lab Results  Component Value Date   HGBA1C 7.9 (H) 06/10/2020   on  PO meds only,     Hypothyroidism:  Lab Results  Component Value Date   TSH 2.19 06/10/2020   on synthroid     Hx of CVA -  With  residual deficits on Aspirin 81 mg,  But not for the past 1 m   CKD stage IIIb- baseline Cr  1.5 CrCl cannot be calculated (Unknown ideal weight.).  Lab Results  Component Value Date   CREATININE 1.48 (H) 06/27/2020   CREATININE 1.56 (H) 06/10/2020   CREATININE 1.48 (H) 04/08/2020      Chronic anemia - baseline hg Hemoglobin & Hematocrit  Recent Labs    04/05/20 1152 06/10/20 1317 06/27/20 1211  HGB 11.0* 11.8* 10.9*    Thrombocytopenia - chronic  While in ER: Hemoglobin 10.9 platelets 81 Lactic acid up to 4.0   Down to 2.8 now Hospitalist was called for admission for acute respiratory failure with hypoxia  The following Work up has been ordered so far:  Orders Placed This Encounter  Procedures  . Critical Care  . Culture, blood (routine x 2)  . Resp Panel by RT-PCR (Flu A&B, Covid) Nasopharyngeal Swab  . DG Chest 2 View  . CT Angio Chest PE W/Cm &/Or Wo Cm  . CBC with Differential  . Basic metabolic panel  . Lactic acid, plasma  . D-dimer, quantitative  . Brain natriuretic peptide  . Consult to hospitalist  ALL PATIENTS BEING ADMITTED/HAVING PROCEDURES NEED COVID-19 SCREENING  . ED EKG  .  EKG 12-Lead    Following Medications were ordered in ER: Medications  levofloxacin (LEVAQUIN) IVPB 750 mg (0 mg Intravenous Stopped 06/27/20 1736)  sodium chloride 0.9 % bolus 500 mL (0 mLs Intravenous Stopped 06/27/20 1736)  iohexol (OMNIPAQUE) 350 MG/ML injection 60 mL (60 mLs Intravenous Contrast Given 06/27/20 1631)  methylPREDNISolone sodium succinate (SOLU-MEDROL) 125 mg/2 mL injection 125 mg (125 mg Intravenous Given 06/27/20 1849)        Consult Orders  (From admission, onward)         Start     Ordered   06/27/20 1814  Consult to hospitalist  ALL PATIENTS BEING ADMITTED/HAVING PROCEDURES NEED COVID-19 SCREENING Paged by Kalman Shan  Once       Comments: ALL PATIENTS BEING ADMITTED/HAVING PROCEDURES NEED COVID-19 SCREENING  Provider:  (Not yet  assigned)  Question Answer Comment  Place call to: [facility]   Reason for Consult Admit      06/27/20 1847          Significant initial  Findings: Abnormal Labs Reviewed  CBC WITH DIFFERENTIAL/PLATELET - Abnormal; Notable for the following components:      Result Value   RBC 3.38 (*)    Hemoglobin 10.9 (*)    HCT 35.2 (*)    MCV 104.1 (*)    RDW 16.7 (*)    Platelets 81 (*)    All other components within normal limits  BASIC METABOLIC PANEL - Abnormal; Notable for the following components:   CO2 19 (*)    Glucose, Bld 375 (*)    Creatinine, Ser 1.48 (*)    Calcium 8.5 (*)    GFR, Estimated 34 (*)    All other components within normal limits  LACTIC ACID, PLASMA - Abnormal; Notable for the following components:   Lactic Acid, Venous 4.0 (*)    All other components within normal limits  LACTIC ACID, PLASMA - Abnormal; Notable for the following components:   Lactic Acid, Venous 2.8 (*)    All other components within normal limits  D-DIMER, QUANTITATIVE (NOT AT [facility]) - Abnormal; Notable for the following components:   D-Dimer, Quant 2.86 (*)    All other components within normal limits  BRAIN NATRIURETIC PEPTIDE - Abnormal; Notable for the following components:   B Natriuretic Peptide 197.0 (*)    All other components within normal limits  TROPONIN I (HIGH SENSITIVITY) - Abnormal; Notable for the following components:   Troponin I (High Sensitivity) 34 (*)    All other components within normal limits  TROPONIN I (HIGH SENSITIVITY) - Abnormal; Notable for the following components:   Troponin I (High Sensitivity) 37 (*)    All other components within normal limits    Otherwise labs showing:   Recent Labs  Lab 06/27/20 1211  NA 140  K 3.8  CO2 19*  GLUCOSE 375*  BUN 20  CREATININE 1.48*  CALCIUM 8.5*    Cr   stable,    from baseline see below Lab Results  Component Value Date   CREATININE 1.48 (H) 06/27/2020   CREATININE 1.56 (H) 06/10/2020    CREATININE 1.48 (H) 04/08/2020    No results for input(s): AST, ALT, ALKPHOS, BILITOT, PROT, ALBUMIN in the last 168 hours. Lab Results  Component Value Date   CALCIUM 8.5 (L) 06/27/2020   PHOS 3.4 10/29/2011   WBC      Component Value Date/Time   WBC 5.6 06/27/2020 1211   LYMPHSABS 0.9 06/27/2020 1211   LYMPHSABS 1.9 09/03/2016 0902  MONOABS 0.5 06/27/2020 1211   MONOABS 0.5 09/03/2016 0902   EOSABS 0.1 06/27/2020 1211   EOSABS 0.2 09/03/2016 0902   BASOSABS 0.1 06/27/2020 1211   BASOSABS 0.0 09/03/2016 0902    Plt: Lab Results  Component Value Date   PLT 81 (L) 06/27/2020    Lactic Acid, Venous    Component Value Date/Time   LATICACIDVEN 2.8 (HH) 06/27/2020 1345    Procalcitonin  Ordered   COVID-19 Labs  Recent Labs    06/27/20 1211  DDIMER 2.86*    Lab Results  Component Value Date   SARSCOV2NAA NEGATIVE 03/30/2019     Venous  Blood Gas result:  PH 7.470  pCO2 38.3   ABG    Component Value Date/Time   TCO2 25 03/30/2019 1149    HG/HCT   Stable,     Component Value Date/Time   HGB 10.9 (L) 06/27/2020 1211   HGB 10.8 (L) 09/03/2016 0902   HCT 35.2 (L) 06/27/2020 1211   HCT 28.7 (L) 03/30/2019 1628   HCT 32.7 (L) 09/03/2016 0902   MCV 104.1 (H) 06/27/2020 1211   MCV 97.9 09/03/2016 0902     Troponin 37 -34 - 42 Cardiac Panel (last 3 results) Recent Labs    06/27/20 2012  CKTOTAL 42       ECG: Ordered Personally reviewed by me showing: HR :64 Rhythm: RBBB    no evidence of ischemic changes QTC 401   BNP (last 3 results) Recent Labs    06/27/20 1211  BNP 197.0*      DM  labs:  HbA1C: Recent Labs    07/31/19 1214 06/10/20 1317  HGBA1C 6.4 7.9*     CBG (last 3)  No results for input(s): GLUCAP in the last 72 hours.     UA  ordered      Ordered   CXR - Suspected widespread fibrosis, grossly stable.      CTA chest - nonacute, no PE,Prominent interstitial lung markings and reticular airspace opacities, with areas  of ground-glass opacification in the bilateral upper lobes, right worse than left. Findings are nonspecific but can be seen in patients with interstitial lung disease or pulmonary edema.   Bronchial wall thickening and mucus plugging bilaterally   Cirrhosis with stigmata of portal hypertension including splenomegaly and esophageal varices.  ED Triage Vitals [06/27/20 1124]  Enc Vitals Group     BP (!) 159/70     Pulse Rate 60     Resp 20     Temp 98.3 F (36.8 C)     Temp Source Oral     SpO2 99 %     Weight      Height      Head Circumference      Peak Flow      Pain Score      Pain Loc      Pain Edu?      Excl. in [place]?   MOQH(47)@       Latest  Blood pressure (!) 153/67, pulse 76, temperature 98.9 F (37.2 C), temperature source Oral, resp. rate (!) 24, SpO2 98 %.    Review of Systems:    Pertinent positives include:  Fatigue,  shortness of breath at rest.  dyspnea on exertion,   Constitutional:  No weight loss, night sweats, Fevers, chills,  weight loss  HEENT:  No headaches, Difficulty swallowing,Tooth/dental problems,Sore throat,  No sneezing, itching, ear ache, nasal congestion, post nasal drip,  Cardio-vascular:  No chest pain,  Orthopnea, PND, anasarca, dizziness, palpitations.no Bilateral lower extremity swelling  GI:  No heartburn, indigestion, abdominal pain, nausea, vomiting, diarrhea, change in bowel habits, loss of appetite, melena, blood in stool, hematemesis Resp:  noNo excess mucus, no productive cough, No non-productive cough, No coughing up of blood.No change in color of mucus.No wheezing. Skin:  no rash or lesions. No jaundice GU:  no dysuria, change in color of urine, no urgency or frequency. No straining to urinate.  No flank pain.  Musculoskeletal:  No joint pain or no joint swelling. No decreased range of motion. No back pain.  Psych:  No change in mood or affect. No depression or anxiety. No memory loss.  Neuro: no localizing neurological  complaints, no tingling, no weakness, no double vision, no gait abnormality, no slurred speech, no confusion  All systems reviewed and apart from [place] all are negative  Past Medical History:   Past Medical History:  Diagnosis Date  . Blood transfusion   . Cancer ([place])    basal cell skin CA  . Diabetes mellitus    type II  . Frequent UTI   . Hyperlipidemia   . Hypertension   . Hypothyroidism   . Macular degeneration of both eyes   . OA (osteoarthritis)   . Stroke [facility]) 03/30/2019     Past Surgical History:  Procedure Laterality Date  . ANKLE FRACTURE SURGERY  10   rt  . CARPAL TUNNEL RELEASE Right 06/06/2017   Procedure: RIGHT CARPAL TUNNEL RELEASE;  Surgeon: Daryll Brod, MD;  Location: [facility];  Service: Orthopedics;  Laterality: Right;  . CATARACT EXTRACTION W/ INTRAOCULAR LENS  IMPLANT, BILATERAL Bilateral 2019  . JOINT REPLACEMENT Right 2010   rt total hip 10  . LUMBAR LAMINECTOMY/DECOMPRESSION MICRODISCECTOMY  06/22/2011   Procedure: LUMBAR LAMINECTOMY/DECOMPRESSION MICRODISCECTOMY;  Surgeon: Marybelle Killings;  Location: [facility];  Service: Orthopedics;  Laterality: N/A;  L3-4, L4-5 Decompression  . MOHS SURGERY  12/08   basal cell skin cancer lt temple  . TAH      and BSO, age 41  . TONSILLECTOMY      Social History:  Ambulatory  walker     reports that she has never smoked. She has never used smokeless tobacco. She reports that she does not drink alcohol and does not use drugs.     Family History:   Family History  Problem Relation Age of Onset  . Arthritis Father        RA  . Hypertension Father   . Heart disease Father        CAD  . Diabetes Father   . Arthritis Sister        RA  . Diabetes Brother   . Cancer Brother   . Diabetes Brother   . Cancer Brother   . Diabetes Brother   . Cancer Brother   . Diabetes Sister   . Cancer Brother     Allergies: Allergies  Allergen Reactions  . Keflex [Cephalexin]     Did ok with lower  dose, higher dose caused a rash    . Mavik [Trandolapril]     Cough      Prior to Admission medications   Medication Sig Start Date End Date Taking? Authorizing Provider  acetaminophen (TYLENOL) 325 MG tablet Take 1-2 tablets (325-650 mg total) by mouth every 4 (four) hours as needed for mild pain. 04/17/19  Yes Love, Ivan Anchors, PA-C  albuterol (VENTOLIN HFA) 108 (90 Base) MCG/ACT inhaler Inhale  2 puffs into the lungs every 4 (four) hours as needed for wheezing (cough). 05/25/20  Yes Tower, Wynelle [name], MD  aspirin EC 81 MG EC tablet Take 1 tablet (81 mg total) by mouth daily. 04/03/19  Yes Aline August, MD  Cholecalciferol (VITAMIN D PO) Take 5,000 Units by mouth daily.   Yes [provider]  CRANBERRY PO Take 2 tablets by mouth daily.    Yes [provider]  dextromethorphan (DELSYM) 30 MG/5ML liquid Take 60 mg by mouth at bedtime as needed for cough. 49ml   Yes [provider]  furosemide (LASIX) 20 MG tablet TAKE 1 TABLET BY MOUTH EVERY DAY Patient taking differently: Take 20 mg by mouth daily. 06/17/20  Yes Tower, Marne A, MD  glimepiride (AMARYL) 4 MG tablet TAKE 1 TABLET BY MOUTH EVERY DAY BEFORE BREAKFAST Patient taking differently: Take 4 mg by mouth daily with breakfast. 04/12/20  Yes Tower, Wynelle [name], MD  levothyroxine (SYNTHROID) 50 MCG tablet Take 1 tablet (50 mcg total) by mouth daily. 04/12/20  Yes Tower, Wynelle [name], MD  losartan (COZAAR) 25 MG tablet TAKE 2 TABLETS BY MOUTH EVERY DAY Patient taking differently: Take 25 mg by mouth daily. 01/28/20  Yes Tower, Wynelle [name], MD  Multiple Vitamin (MULTIVITAMIN) capsule Take 1 capsule by mouth daily.   Yes [provider]  Omega-3 Fatty Acids (FISH OIL) 1200 MG CAPS Take 1,200 mg by mouth daily.   Yes [provider]  omeprazole (PRILOSEC) 40 MG capsule Take 1 capsule (40 mg total) by mouth daily. 04/29/20  Yes Gollan, Kathlene November, MD  rosuvastatin (CRESTOR) 10 MG tablet Take 1 tablet (10 mg total) by  mouth daily. 04/12/20  Yes Tower, Wynelle [name], MD  TRUE METRIX BLOOD GLUCOSE TEST test strip CHECK BLOOD SUGAR ONCE DAILY AND AS DIRECTED.DX E11.43 06/07/20   Tower, Wynelle [name], MD  TRUEplus Lancets 30G MISC 1 EACH BY SUBDERMAL ROUTE DAILY. CHECK BLOOD SUGAR ONCE DAILY AND AS DIRECTED.DX E11.43 10/08/19   [name], MD   Physical Exam: Vitals with BMI 06/27/2020 06/27/2020 06/27/2020  Height - - -  Weight - - -  BMI - - -  Systolic 401 027 253  Diastolic 67 73 92  Pulse 76 79 50     1. General:  in No  Acute distress  increased work of breathing    Chronically ill  -appearing 2. Psychological: Alert and   Oriented 3. Head/ENT:    Dry Mucous Membranes                          Head Non traumatic, neck supple                            Poor Dentition 4. SKIN:   decreased Skin turgor,  Skin clean Dry and intact no rash 5. Heart: Regular rate and rhythm no Murmur, no Rub or gallop 6. Lungs no wheezes some  crackles   7. Abdomen: Soft,  non-tender, Non distended   Obese  8. Lower extremities:    cyanosis, trace  edema 9. Neurologically Grossly intact, moving all 4 extremities equally   10. MSK: Normal range of motion  upper ext clubbing,  All other LABS:     Recent Labs  Lab 06/27/20 1211  WBC 5.6  NEUTROABS 4.0  HGB 10.9*  HCT 35.2*  MCV 104.1*  PLT 81*     Recent  Labs  Lab 06/27/20 1211  NA 140  K 3.8  CL 107  CO2 19*  GLUCOSE 375*  BUN 20  CREATININE 1.48*  CALCIUM 8.5*     No results for input(s): AST, ALT, ALKPHOS, BILITOT, PROT, ALBUMIN in the last 168 hours.     Cultures:    Component Value Date/Time   SDES URINE, RANDOM 04/08/2019 1232   SPECREQUEST  04/08/2019 1232    NONE Performed at [facility], [place] [address], [place], [place] [address]    CULT (A) 04/08/2019 1232    >=100,000 COLONIES/mL ESCHERICHIA COLI >=100,000 COLONIES/mL PROTEUS MIRABILIS    REPTSTATUS 04/11/2019 FINAL 04/08/2019 1232     Radiological Exams on Admission: DG  Chest 2 View  Result Date: 06/27/2020 CLINICAL DATA:  Shortness of breath EXAM: CHEST - 2 VIEW COMPARISON:  June 10, 2020 and April 05, 2020 FINDINGS: There is persistent elevation of the right hemidiaphragm. Widespread interstitial opacity persists, likely in large part representing fibrosis. No appreciable new opacity compared to most recent study. Heart is slightly enlarged with pulmonary vascularity normal. No adenopathy. No bone lesions. IMPRESSION: Suspected widespread fibrosis, grossly stable. A degree of underlying pulmonary edema and/or interstitial pneumonia difficult to exclude in this circumstance. Stable cardiac silhouette. Stable elevation right hemidiaphragm. Electronically Signed   By: Lowella Grip III M.D.   On: 06/27/2020 12:23   CT Angio Chest PE W/Cm &/Or Wo Cm  Result Date: 06/27/2020 CLINICAL DATA:  PE suspected. EXAM: CT ANGIOGRAPHY CHEST WITH CONTRAST TECHNIQUE: Multidetector CT imaging of the chest was performed using the standard protocol during bolus administration of intravenous contrast. Multiplanar CT image reconstructions and MIPs were obtained to evaluate the vascular anatomy. CONTRAST:  80mL OMNIPAQUE IOHEXOL 350 MG/ML SOLN COMPARISON:  None. FINDINGS: Cardiovascular: Evaluation is limited by respiratory motion artifact. Within that limitation, there is no central pulmonary embolus. The size of the main pulmonary artery is normal. Cardiomegaly with coronary artery calcification. The course and caliber of the aorta are normal. There is mild atherosclerotic calcification. Opacification decreased due to pulmonary arterial phase contrast bolus timing. Mediastinum/Nodes: --mild mediastinal adenopathy is noted. --hilar adenopathy is noted. -- No axillary lymphadenopathy. --there is mild left supraclavicular adenopathy. -- Normal thyroid gland where visualized. -  Unremarkable esophagus. Lungs/Pleura: Prominent interstitial lung markings and reticular airspace opacities  are noted. There are areas of ground-glass opacification in the bilateral upper lobes, right worse than left. There is bronchial wall thickening and mucus plugging bilaterally. There is no pneumothorax. There is no large pleural effusion. Upper Abdomen: Contrast bolus timing is not optimized for evaluation of the abdominal organs. There is recanalization of the umbilical vein in addition to splenomegaly, both consistent with cirrhosis with stigmata of portal hypertension. There is a large hiatal hernia with multiple esophageal varices. Musculoskeletal: No chest wall abnormality. No bony spinal canal stenosis. Review of the MIP images confirms the above findings. IMPRESSION: 1. Evaluation is limited by respiratory motion artifact. Within that limitation, there is no central pulmonary embolus. 2. Prominent interstitial lung markings and reticular airspace opacities, with areas of ground-glass opacification in the bilateral upper lobes, right worse than left. Findings are nonspecific but can be seen in patients with interstitial lung disease or pulmonary edema. A superimposed infectious process is not excluded. 3. Bronchial wall thickening and mucus plugging bilaterally. Findings can be seen in patients with reactive or infectious bronchiolitis. 4. Cardiomegaly with coronary artery disease. 5. Cirrhosis with stigmata of portal hypertension including splenomegaly and esophageal varices.  6. Large hiatal. Aortic Atherosclerosis (ICD10-I70.0). Electronically Signed   By: Constance Holster M.D.   On: 06/27/2020 16:44    Chart has been reviewed   Assessment/Plan  86 y.o. female with medical history significant of diabetes, frequent UTIs, HLD, HTN, hypothyroidism, history of stroke, iron deficiency, CHF, CKD, thrombocytopenia, GERD, MDS  Admitted for acute   respiratory failure with hypoxia likely secondary to exacerbation interstitial lung disease and new diagnosis of cirrhosis  Present on Admission: . Abnormal CT  scan of lung -suspect underlying to social lung disease.  Patient will likely benefit from follow-up with pulmonology.  Will need home oxygen for now. Obtain ANA and sed rate and CRP will probably need further work-up  Evidence of clubbing likely chronic lung disease Discussed with pulmonology For now with start on steroids Solumedrol 40 qh  And ABx Order flutter valve given evidence of mucus plugging Appreciate pulmonology consult in AM  Cirrhosis of the liver-  no hx of ETOH, will check lipid panel, Hepatitis serologies will benefir from GI work up, defer to GI if needs additional imaging  new diagnosis  . Acute respiratory failure with hypoxia (HCC) -no evidence of acute infection or PE.  Repeat imaging does show chronic interstitial lung disease less likely resulting in progressive chronic hypoxia Patient would benefit from home oxygen set up. Hypoxia documented by oxygen saturation in 80s while on RA  . Thrombocytopenia ([place]) -chronic could be related to liver disease  . Myelodysplastic syndrome ([place]) -defer to outpatient oncology follow-up  . Normocytic anemia -obtain anemia panel  . Hypothyroid - - Check TSH continue home medications at current dose  . Hyperlipidemia associated with type 2 diabetes mellitus ([place]) -chronic stable continue home medication  . GERD (gastroesophageal reflux disease) -continue PPI  . Essential hypertension -given elevated lactic acid observe for now but if patient stable and blood pressure stable will resume  . Controlled type 2 diabetes mellitus with diabetic autonomic neuropathy (HCC) -  - Order Sensitive SSI     -  check TSH and HgA1C  - Hold by mouth medications    . CKD (chronic kidney disease) -  -chronic avoid nephrotoxic medications such as NSAIDs, Vanco Zosyn combo,  avoid hypotension, continue to follow renal function   Elevated troponin -  -no chest pain no EKG changes,  in the setting of  increased work of breathing and  chronic  kidney disease likely due to demand ischemia and poor clearance, monitor on telemetry and cycle cardiac enzymes to trend.  if continues to rise will need further work-up obtain echo in AM  Elevated lactic acid - in the setting of increased work of breathing and prolonged hypoxia now improving  Leg edema -given elevated D-dimer and poor mobility will obtain Dopplers to rule out DVT, could be lipoedema   DM 2 -  - Order Sensitive SSI     -  check TSH and HgA1C  - Hold by mouth medications    Other plan as per orders.  DVT prophylaxis:    scd's    Code Status:    Code Status: Prior   DNR/DNI  as per family  I had personally discussed CODE STATUS with  Family    Family Communication:   Family  at  Bedside  plan of care was discussed on the phone with  Daughter   Disposition Plan:        To home once workup is complete and patient is stable   Following barriers for  discharge:                            Electrolytes corrected                                                                                Will likely need home health, home O2, set up                                   Would benefit from PT/OT eval prior to [place] called: discussed with Pulmonology and sent a msg to [facility] Dr. Fuller Plan  Admission status:  ED Disposition    ED Disposition Condition [place]: [place] [[id number]]  Level of Care: Progressive [102]  Admit to Progressive based on following criteria: RESPIRATORY PROBLEMS hypoxemic/hypercapnic respiratory failure that is responsive to NIPPV (BiPAP) or High Flow Nasal Cannula (6-80 lpm). Frequent assessment/intervention, no > Q2 hrs < Q4 hrs, to maintain oxygenation and pulmonary hygiene.  May admit patient to [facility] or [facility] if equivalent level of care is available:: No  Covid Evaluation: Symptomatic Person Under Investigation (PUI)  Diagnosis: Acute  respiratory failure with hypoxia [facility]) [[id number]]  Admitting Physician: Toy [place] [[id number]]  Attending Physician: Toy [place] [[id number]]  Estimated length of stay: past midnight tomorrow  Certification:: I certify this patient will need inpatient services for at least 2 midnights        inpatient     I Expect 2 midnight stay secondary to severity of patient's current illness need for inpatient interventions justified by the following:  hemodynamic instability despite optimal treatment ( hypoxia )  Severe lab/radiological/exam abnormalities including:    abnormal CT  and extensive comorbidities including:  DM2    CHF    CKD  history of stroke with residual deficits   That are currently affecting medical management.   I expect  patient to be hospitalized for 2 midnights requiring inpatient medical care.  Patient is at high risk for adverse outcome (such as loss of life or disability) if not treated.  Indication for inpatient stay as follows:    Hemodynamic instability despite maximal medical therapy,    New or worsening hypoxia      Level of care    tele  For  24H       Lab Results  Component Value Date   [place] NEGATIVE 03/30/2019     Precautions: admitted as  Covid Negative   PPE: Used by the provider:   P100  eye Goggles,  Gloves    Ilaisaane Marts 06/27/2020, 11:41 PM    [facility]     after 2 AM please page floor coverage PA If 7AM-7PM, please contact the day team taking care of the patient using [email or web address]   Patient was evaluated in the context of  the global COVID-19 pandemic, which necessitated consideration that the patient might be at risk for infection with the SARS-CoV-2 virus that causes COVID-19. Institutional protocols and algorithms that pertain to the evaluation of patients at risk for COVID-19 are in a state of rapid change based on information released by regulatory bodies including the CDC and federal and state  organizations. These policies and algorithms were followed during the patient's care.

## 2020-06-27 NOTE — ED Triage Notes (Signed)
x2 increase in productive cough and shortness of breath. EMS noted pt in low 80s on RA. Pt has mass on lungs that was found in April of last year unsure if benign. Pt currently 97 % on 4 L O2 [place]. New pedal edema noted.   BP-132/96 HR- 110s EKG shows sinus tach w/ PVCs and PACs Afebrile w/ EMS

## 2020-06-28 ENCOUNTER — Inpatient Hospital Stay (HOSPITAL_COMMUNITY): Payer: Medicare HMO

## 2020-06-28 ENCOUNTER — Encounter (HOSPITAL_COMMUNITY): Payer: Self-pay | Admitting: Internal Medicine

## 2020-06-28 DIAGNOSIS — I340 Nonrheumatic mitral (valve) insufficiency: Secondary | ICD-10-CM

## 2020-06-28 DIAGNOSIS — R0902 Hypoxemia: Secondary | ICD-10-CM

## 2020-06-28 DIAGNOSIS — R0602 Shortness of breath: Secondary | ICD-10-CM

## 2020-06-28 DIAGNOSIS — I361 Nonrheumatic tricuspid (valve) insufficiency: Secondary | ICD-10-CM

## 2020-06-28 LAB — CBC WITH DIFFERENTIAL/PLATELET
Abs Immature Granulocytes: 0.04 10*3/uL (ref 0.00–0.07)
Basophils Absolute: 0 10*3/uL (ref 0.0–0.1)
Basophils Relative: 0 %
Eosinophils Absolute: 0 10*3/uL (ref 0.0–0.5)
Eosinophils Relative: 0 %
HCT: 32.3 % — ABNORMAL LOW (ref 36.0–46.0)
Hemoglobin: 10.4 g/dL — ABNORMAL LOW (ref 12.0–15.0)
Immature Granulocytes: 1 %
Lymphocytes Relative: 17 %
Lymphs Abs: 0.8 10*3/uL (ref 0.7–4.0)
MCH: 33.2 pg (ref 26.0–34.0)
MCHC: 32.2 g/dL (ref 30.0–36.0)
MCV: 103.2 fL — ABNORMAL HIGH (ref 80.0–100.0)
Monocytes Absolute: 0.1 10*3/uL (ref 0.1–1.0)
Monocytes Relative: 2 %
Neutro Abs: 3.8 10*3/uL (ref 1.7–7.7)
Neutrophils Relative %: 80 %
Platelets: 79 10*3/uL — ABNORMAL LOW (ref 150–400)
RBC: 3.13 MIL/uL — ABNORMAL LOW (ref 3.87–5.11)
RDW: 16.7 % — ABNORMAL HIGH (ref 11.5–15.5)
WBC: 4.7 10*3/uL (ref 4.0–10.5)
nRBC: 0 % (ref 0.0–0.2)

## 2020-06-28 LAB — VITAMIN B12: Vitamin B-12: 1152 pg/mL — ABNORMAL HIGH (ref 180–914)

## 2020-06-28 LAB — ECHOCARDIOGRAM COMPLETE
Area-P 1/2: 2.62 cm2
P 1/2 time: 743 msec
S' Lateral: 3 cm

## 2020-06-28 LAB — LACTIC ACID, PLASMA
Lactic Acid, Venous: 1.7 mmol/L (ref 0.5–1.9)
Lactic Acid, Venous: 2.5 mmol/L (ref 0.5–1.9)

## 2020-06-28 LAB — IRON AND TIBC
Iron: 21 ug/dL — ABNORMAL LOW (ref 28–170)
Saturation Ratios: 11 % (ref 10.4–31.8)
TIBC: 185 ug/dL — ABNORMAL LOW (ref 250–450)
UIBC: 164 ug/dL

## 2020-06-28 LAB — PHOSPHORUS: Phosphorus: 3.4 mg/dL (ref 2.5–4.6)

## 2020-06-28 LAB — COMPREHENSIVE METABOLIC PANEL
ALT: 23 U/L (ref 0–44)
AST: 43 U/L — ABNORMAL HIGH (ref 15–41)
Albumin: 2.1 g/dL — ABNORMAL LOW (ref 3.5–5.0)
Alkaline Phosphatase: 70 U/L (ref 38–126)
Anion gap: 10 (ref 5–15)
BUN: 20 mg/dL (ref 8–23)
CO2: 21 mmol/L — ABNORMAL LOW (ref 22–32)
Calcium: 8.2 mg/dL — ABNORMAL LOW (ref 8.9–10.3)
Chloride: 107 mmol/L (ref 98–111)
Creatinine, Ser: 1.36 mg/dL — ABNORMAL HIGH (ref 0.44–1.00)
GFR, Estimated: 37 mL/min — ABNORMAL LOW (ref >60)
Glucose, Bld: 296 mg/dL — ABNORMAL HIGH (ref 70–99)
Potassium: 3.6 mmol/L (ref 3.5–5.1)
Sodium: 138 mmol/L (ref 135–145)
Total Bilirubin: 0.9 mg/dL (ref 0.3–1.2)
Total Protein: 6.3 g/dL — ABNORMAL LOW (ref 6.5–8.1)

## 2020-06-28 LAB — PROTIME-INR
INR: 1.4 — ABNORMAL HIGH (ref 0.8–1.2)
Prothrombin Time: 16.3 seconds — ABNORMAL HIGH (ref 11.4–15.2)

## 2020-06-28 LAB — RETICULOCYTES
Immature Retic Fract: 27.2 % — ABNORMAL HIGH (ref 2.3–15.9)
RBC.: 3.11 MIL/uL — ABNORMAL LOW (ref 3.87–5.11)
Retic Count, Absolute: 74 10*3/uL (ref 19.0–186.0)
Retic Ct Pct: 2.4 % (ref 0.4–3.1)

## 2020-06-28 LAB — CBG MONITORING, ED
Glucose-Capillary: 233 mg/dL — ABNORMAL HIGH (ref 70–99)
Glucose-Capillary: 237 mg/dL — ABNORMAL HIGH (ref 70–99)
Glucose-Capillary: 246 mg/dL — ABNORMAL HIGH (ref 70–99)
Glucose-Capillary: 282 mg/dL — ABNORMAL HIGH (ref 70–99)

## 2020-06-28 LAB — TROPONIN I (HIGH SENSITIVITY): Troponin I (High Sensitivity): 42 ng/L — ABNORMAL HIGH (ref <18)

## 2020-06-28 LAB — LIPID PANEL
Cholesterol: 110 mg/dL (ref 0–200)
HDL: 20 mg/dL — ABNORMAL LOW (ref >40)
LDL Cholesterol: 79 mg/dL (ref 0–99)
Total CHOL/HDL Ratio: 5.5 RATIO
Triglycerides: 57 mg/dL (ref <150)
VLDL: 11 mg/dL (ref 0–40)

## 2020-06-28 LAB — TSH: TSH: 0.52 u[IU]/mL (ref 0.350–4.500)

## 2020-06-28 LAB — GLUCOSE, CAPILLARY
Glucose-Capillary: 400 mg/dL — ABNORMAL HIGH (ref 70–99)
Glucose-Capillary: 363 mg/dL — ABNORMAL HIGH (ref 70–99)
Glucose-Capillary: 273 mg/dL — ABNORMAL HIGH (ref 70–99)
Glucose-Capillary: 177 mg/dL — ABNORMAL HIGH (ref 70–99)

## 2020-06-28 LAB — HEPATITIS PANEL, ACUTE
HCV Ab: NONREACTIVE
Hep A IgM: NONREACTIVE
Hep B C IgM: NONREACTIVE
Hepatitis B Surface Ag: NONREACTIVE

## 2020-06-28 LAB — PROCALCITONIN
Procalcitonin: 0.1 ng/mL
Procalcitonin: <0.1 ng/mL
Procalcitonin: 0.13 ng/mL

## 2020-06-28 LAB — MAGNESIUM: Magnesium: 2.1 mg/dL (ref 1.7–2.4)

## 2020-06-28 LAB — FOLATE: Folate: 33.7 ng/mL (ref >5.9)

## 2020-06-28 MED ORDER — PREDNISONE 20 MG PO TABS
20.0000 mg | ORAL_TABLET | Freq: Every day | ORAL | Status: DC
  Administered 2020-06-30 – 2020-07-01 (×2): 20 mg via ORAL
  Filled 2020-06-28 (×3): qty 1

## 2020-06-28 MED ORDER — SODIUM CHLORIDE 0.9 % IV SOLN
100.0000 mg | Freq: Two times a day (BID) | INTRAVENOUS | Status: DC
  Administered 2020-06-28 – 2020-06-29 (×2): 100 mg via INTRAVENOUS
  Filled 2020-06-28 (×4): qty 100

## 2020-06-28 MED ORDER — INSULIN ASPART 100 UNIT/ML ~~LOC~~ SOLN
15.0000 [IU] | Freq: Once | SUBCUTANEOUS | Status: AC
  Administered 2020-06-28: 15 [IU] via SUBCUTANEOUS

## 2020-06-28 MED ORDER — FUROSEMIDE 10 MG/ML IJ SOLN
20.0000 mg | Freq: Every day | INTRAMUSCULAR | Status: DC
  Administered 2020-06-28 – 2020-06-29 (×2): 20 mg via INTRAVENOUS
  Filled 2020-06-28 (×2): qty 2

## 2020-06-28 MED ORDER — FERROUS SULFATE 325 (65 FE) MG PO TABS
325.0000 mg | ORAL_TABLET | Freq: Every day | ORAL | Status: DC
  Administered 2020-06-29 – 2020-07-01 (×3): 325 mg via ORAL
  Filled 2020-06-28 (×4): qty 1

## 2020-06-28 NOTE — ED Notes (Signed)
Assisted patient to the bathroom. Pt ambulatory with one person assistance. Resp even and unlabored. Strong, congested cough assessed. Pt placed in bed and on monitor.

## 2020-06-28 NOTE — Progress Notes (Signed)
Inpatient Diabetes Program Recommendations  AACE/ADA: New Consensus Statement on Inpatient Glycemic Control   Target Ranges:  Prepandial:   less than 140 mg/dL      Peak postprandial:   less than 180 mg/dL (1-2 hours)      Critically ill patients:  140 - 180 mg/dL   Results for Stacey, Harris (MRN [id number]) as of 06/28/2020 08:24  Ref. Range 06/27/2020 20:10 06/28/2020 00:17 06/28/2020 05:17  Glucose-Capillary Latest Ref Range: 70 - 99 mg/dL 161 (H) 282 (H) 246 (H)  Results for Stacey, Harris (MRN [id number]) as of 06/28/2020 08:24  Ref. Range 06/10/2020 13:17 06/27/2020 20:12  Hemoglobin A1C Latest Ref Range: 4.8 - 5.6 % 7.9 (H) 7.9 (H)   Review of Glycemic Control  Diabetes history: DM2 Outpatient Diabetes medications: Amaryl 4 mg QAM Current orders for Inpatient glycemic control: Novolog 0-9 units Q4H; Solumedrol 40 mg Q6H  Inpatient Diabetes Program Recommendations:    Insulin: If steroids are continued as ordered, please consider ordering Levemir 10 units Q24H.  NOTE: Noted consult for diabetes coordinator. Chart reviewed. Noted patient has DM2 and takes Amaryl outpatient with A1C of 7.9% (good goal for patient given she is 86 years old) indicating an average glucose of 180 mg/dl. Patient is currently ordered steroids which are contributing to hyperglycemia. Would recommend adding basal insulin if steroids are continued as ordered. Diabetes coordinator will continue to follow along.  Thanks, Barnie Alderman, RN, MSN, CDE Diabetes Coordinator Inpatient Diabetes Program [phone] (Team Pager from 8am to 5pm)

## 2020-06-28 NOTE — ED Notes (Signed)
Pt transitioned into a Hospital Bed for comfort and better care. Pt made aware of bed mechanisms to adjust bed settings and pt reminded of call bell and to call for assistance for anything.

## 2020-06-28 NOTE — ED Notes (Signed)
Lunch Tray Ordered @ 1117. 

## 2020-06-28 NOTE — Progress Notes (Signed)
CRITICAL VALUE ALERT  Critical Value:  CBG 400  Date & Time Notied:  06/28/2020 4:10 pm  Provider Notified: J. Verlon Au 06/28/2020 @4 :11 pm  Orders Received/Actions taken: see new orders.

## 2020-06-28 NOTE — Consult Note (Signed)
Referring Provider:  [facility] Primary Care Physician:  Tower, Wynelle [name], MD Primary Gastroenterologist: Unassigned//remotely seen by Howie Ill in 2004, now followed by  [facility] PCP  Reason for Consultation: Cirrhosis  HPI: Stacey Harris is a 86 y.o. female with past medical history of diabetes, hypertension, hyperlipidemia, history of stroke, chronic thrombocytopenia, myelodysplastic syndrome, chronic iron deficiency anemia admitted to the hospital with fatigue and shortness of breath. patient with dyspnea on exertion for several months now.  CT chest showed finding of liver cirrhosis with portal hypertension, large hiatal hernia and esophageal varices.  GI is consulted for further evaluation.  Patient seen and examined at bedside. Patient's daughter was at bedside as well. Patient denied knowing having any liver disease in the past. Patient denied significant alcohol use. Denies family history of liver disease. She is complaining of left sided abdominal pain for last 6 months. Denied any nausea or vomiting. Denies any diarrhea or constipation. Denies any blood in the stool or black stool. Occasional age-related confusion according to daughter.     Past Medical History:  Diagnosis Date  . Blood transfusion   . Cancer ([place])    basal cell skin CA  . Diabetes mellitus    type II  . Frequent UTI   . Hyperlipidemia   . Hypertension   . Hypothyroidism   . Macular degeneration of both eyes   . OA (osteoarthritis)   . Stroke [facility]) 03/30/2019    Past Surgical History:  Procedure Laterality Date  . ANKLE FRACTURE SURGERY  10   rt  . CARPAL TUNNEL RELEASE Right 06/06/2017   Procedure: RIGHT CARPAL TUNNEL RELEASE;  Surgeon: Daryll Brod, MD;  Location: [facility];  Service: Orthopedics;  Laterality: Right;  . CATARACT EXTRACTION W/ INTRAOCULAR LENS  IMPLANT, BILATERAL Bilateral 2019  . JOINT REPLACEMENT Right 2010   rt total hip 10  . LUMBAR LAMINECTOMY/DECOMPRESSION MICRODISCECTOMY   06/22/2011   Procedure: LUMBAR LAMINECTOMY/DECOMPRESSION MICRODISCECTOMY;  Surgeon: Marybelle Killings;  Location: [facility];  Service: Orthopedics;  Laterality: N/A;  L3-4, L4-5 Decompression  . MOHS SURGERY  12/08   basal cell skin cancer lt temple  . TAH      and BSO, age 25  . TONSILLECTOMY      Prior to Admission medications   Medication Sig Start Date End Date Taking? Authorizing Provider  acetaminophen (TYLENOL) 325 MG tablet Take 1-2 tablets (325-650 mg total) by mouth every 4 (four) hours as needed for mild pain. 04/17/19  Yes Love, Ivan Anchors, PA-C  albuterol (VENTOLIN HFA) 108 (90 Base) MCG/ACT inhaler Inhale 2 puffs into the lungs every 4 (four) hours as needed for wheezing (cough). 05/25/20  Yes Tower, Wynelle [name], MD  aspirin EC 81 MG EC tablet Take 1 tablet (81 mg total) by mouth daily. 04/03/19  Yes Aline August, MD  Cholecalciferol (VITAMIN D PO) Take 5,000 Units by mouth daily.   Yes [provider]  CRANBERRY PO Take 2 tablets by mouth daily.    Yes [provider]  dextromethorphan (DELSYM) 30 MG/5ML liquid Take 60 mg by mouth at bedtime as needed for cough. 71ml   Yes [provider]  furosemide (LASIX) 20 MG tablet TAKE 1 TABLET BY MOUTH EVERY DAY Patient taking differently: Take 20 mg by mouth daily. 06/17/20  Yes Tower, Marne A, MD  glimepiride (AMARYL) 4 MG tablet TAKE 1 TABLET BY MOUTH EVERY DAY BEFORE BREAKFAST Patient taking differently: Take 4 mg by mouth daily with breakfast. 04/12/20  Yes Tower, Wynelle [name], MD  levothyroxine (SYNTHROID) 50 MCG tablet Take 1 tablet (50 mcg total) by mouth daily. 04/12/20  Yes Tower, Wynelle [name], MD  losartan (COZAAR) 25 MG tablet TAKE 2 TABLETS BY MOUTH EVERY DAY Patient taking differently: Take 25 mg by mouth daily. 01/28/20  Yes Tower, Wynelle [name], MD  Multiple Vitamin (MULTIVITAMIN) capsule Take 1 capsule by mouth daily.   Yes [provider]  Omega-3 Fatty Acids (FISH OIL) 1200 MG CAPS Take 1,200 mg by mouth daily.    Yes [provider]  omeprazole (PRILOSEC) 40 MG capsule Take 1 capsule (40 mg total) by mouth daily. 04/29/20  Yes Gollan, Kathlene November, MD  rosuvastatin (CRESTOR) 10 MG tablet Take 1 tablet (10 mg total) by mouth daily. 04/12/20  Yes Tower, Wynelle [name], MD  TRUE METRIX BLOOD GLUCOSE TEST test strip CHECK BLOOD SUGAR ONCE DAILY AND AS DIRECTED.DX E11.43 06/07/20   Tower, Wynelle [name], MD  TRUEplus Lancets 30G MISC 1 EACH BY SUBDERMAL ROUTE DAILY. CHECK BLOOD SUGAR ONCE DAILY AND AS DIRECTED.DX E11.43 10/08/19   Tower, Wynelle [name], MD    Scheduled Meds: . insulin aspart  0-9 Units Subcutaneous Q4H  . levothyroxine  50 mcg Oral Q0600  . methylPREDNISolone (SOLU-MEDROL) injection  40 mg Intravenous Q6H  . pantoprazole  40 mg Oral Daily  . rosuvastatin  10 mg Oral Daily  . sodium chloride flush  3 mL Intravenous Q12H   Continuous Infusions: . sodium chloride    . doxycycline (VIBRAMYCIN) IV     PRN Meds:.sodium chloride, acetaminophen **OR** acetaminophen, albuterol, dextromethorphan, HYDROcodone-acetaminophen, sodium chloride flush  Allergies as of 06/27/2020 - Review Complete 06/27/2020  Allergen Reaction Noted  . Keflex [cephalexin]  11/28/2017  . Mavik [trandolapril]  02/24/2016    Family History  Problem Relation Age of Onset  . Arthritis Father        RA  . Hypertension Father   . Heart disease Father        CAD  . Diabetes Father   . Arthritis Sister        RA  . Diabetes Brother   . Cancer Brother   . Diabetes Brother   . Cancer Brother   . Diabetes Brother   . Cancer Brother   . Diabetes Sister   . Cancer Brother     Social History   Socioeconomic History  . Marital status: Widowed    Spouse name: Not on file  . Number of children: 4  . Years of education: 62  . Highest education level: Not on file  Occupational History  . Not on file  Tobacco Use  . Smoking status: Never Smoker  . Smokeless tobacco: Never Used  Vaping Use  . Vaping Use: Never used  Substance  and Sexual Activity  . Alcohol use: No    Alcohol/week: 0.0 standard drinks  . Drug use: No  . Sexual activity: Never    Birth control/protection: Post-menopausal  Other Topics Concern  . Not on file  Social History Narrative   04/22/19 Lives with daughter, [name]   No caffeine   Social Determinants of Health   Financial Resource Strain: Not on file  Food Insecurity: Not on file  Transportation Needs: Not on file  Physical Activity: Not on file  Stress: Not on file  Social Connections: Not on file  Intimate Partner Violence: Not on file    Review of Systems: All negative except as stated above in HPI.  Physical Exam: Vital  signs: Vitals:   06/28/20 0600 06/28/20 0841  BP: (!) 124/100 (!) 147/71  Pulse: 65 64  Resp: 20 20  Temp:  (!) 97.5 F (36.4 C)  SpO2: 100% 100%     General:   Elderly patient, not in acute distress. Resting comfortably HEENT : Normocephalic, atraumatic, extraocular movement intact Lungs: Anterior exam only. Occasional wheezing noted. Decreased breath sounds bilaterally. Heart:  Regular rate and rhythm; no murmurs, clicks, rubs,  or gallops. Abdomen: Soft, nontender, nondistended, bowel sounds present. No peritoneal signs Psych-mood and affect normal Neuro-alert/oriented x3 Lower extremity. No significant edema. Rectal:  Deferred  GI:  Lab Results: Recent Labs    06/27/20 1211 06/27/20 2018 06/28/20 0245  WBC 5.6  --  4.7  HGB 10.9* 9.5* 10.4*  HCT 35.2* 28.0* 32.3*  PLT 81*  --  79*   BMET Recent Labs    06/27/20 1211 06/27/20 2018 06/28/20 0245  NA 140 144 138  K 3.8 3.6 3.6  CL 107  --  107  CO2 19*  --  21*  GLUCOSE 375*  --  296*  BUN 20  --  20  CREATININE 1.48*  --  1.36*  CALCIUM 8.5*  --  8.2*   LFT Recent Labs    06/27/20 2012 06/28/20 0245  PROT 6.4* 6.3*  ALBUMIN 2.0* 2.1*  AST 45* 43*  ALT 23 23  ALKPHOS 67 70  BILITOT 1.1 0.9  BILIDIR 0.2  --   IBILI 0.9  --    PT/INR Recent Labs     06/27/20 2340  LABPROT 16.3*  INR 1.4*     Studies/Results: DG Chest 2 View  Result Date: 06/27/2020 CLINICAL DATA:  Shortness of breath EXAM: CHEST - 2 VIEW COMPARISON:  June 10, 2020 and April 05, 2020 FINDINGS: There is persistent elevation of the right hemidiaphragm. Widespread interstitial opacity persists, likely in large part representing fibrosis. No appreciable new opacity compared to most recent study. Heart is slightly enlarged with pulmonary vascularity normal. No adenopathy. No bone lesions. IMPRESSION: Suspected widespread fibrosis, grossly stable. A degree of underlying pulmonary edema and/or interstitial pneumonia difficult to exclude in this circumstance. Stable cardiac silhouette. Stable elevation right hemidiaphragm. Electronically Signed   By: Lowella Grip III M.D.   On: 06/27/2020 12:23   CT Angio Chest PE W/Cm &/Or Wo Cm  Result Date: 06/27/2020 CLINICAL DATA:  PE suspected. EXAM: CT ANGIOGRAPHY CHEST WITH CONTRAST TECHNIQUE: Multidetector CT imaging of the chest was performed using the standard protocol during bolus administration of intravenous contrast. Multiplanar CT image reconstructions and MIPs were obtained to evaluate the vascular anatomy. CONTRAST:  31mL OMNIPAQUE IOHEXOL 350 MG/ML SOLN COMPARISON:  None. FINDINGS: Cardiovascular: Evaluation is limited by respiratory motion artifact. Within that limitation, there is no central pulmonary embolus. The size of the main pulmonary artery is normal. Cardiomegaly with coronary artery calcification. The course and caliber of the aorta are normal. There is mild atherosclerotic calcification. Opacification decreased due to pulmonary arterial phase contrast bolus timing. Mediastinum/Nodes: --mild mediastinal adenopathy is noted. --hilar adenopathy is noted. -- No axillary lymphadenopathy. --there is mild left supraclavicular adenopathy. -- Normal thyroid gland where visualized. -  Unremarkable esophagus. Lungs/Pleura:  Prominent interstitial lung markings and reticular airspace opacities are noted. There are areas of ground-glass opacification in the bilateral upper lobes, right worse than left. There is bronchial wall thickening and mucus plugging bilaterally. There is no pneumothorax. There is no large pleural effusion. Upper Abdomen: Contrast bolus timing is not optimized  for evaluation of the abdominal organs. There is recanalization of the umbilical vein in addition to splenomegaly, both consistent with cirrhosis with stigmata of portal hypertension. There is a large hiatal hernia with multiple esophageal varices. Musculoskeletal: No chest wall abnormality. No bony spinal canal stenosis. Review of the MIP images confirms the above findings. IMPRESSION: 1. Evaluation is limited by respiratory motion artifact. Within that limitation, there is no central pulmonary embolus. 2. Prominent interstitial lung markings and reticular airspace opacities, with areas of ground-glass opacification in the bilateral upper lobes, right worse than left. Findings are nonspecific but can be seen in patients with interstitial lung disease or pulmonary edema. A superimposed infectious process is not excluded. 3. Bronchial wall thickening and mucus plugging bilaterally. Findings can be seen in patients with reactive or infectious bronchiolitis. 4. Cardiomegaly with coronary artery disease. 5. Cirrhosis with stigmata of portal hypertension including splenomegaly and esophageal varices. 6. Large hiatal. Aortic Atherosclerosis (ICD10-I70.0). Electronically Signed   By: Constance Holster M.D.   On: 06/27/2020 16:44    Impression/Plan: -86 year old patient with cirrhosis of the liver. Newly diagnosed. No known liver disease. Most likely from fatty liver. Hepatitis panel negative -Portal  hypertension with esophageal varices seen on the CT scan -Large hiatal hernia -Dyspnea on exertion. Possible interstitial lung disease -History of  stroke  Recommendations ------------------------ -Get ultrasound of the liver -Diagnosis of cirrhosis and its complications such as ascites, varices, encephalopathy, jaundice and risk of liver cancer discussed with patient as well as patient's daughter. -They do not want to have any invasive procedures done at this time. -Recommend starting nadolol 10 mg once a day if okay with pulmonary physician for prophylaxis for esophageal varices. Patient with fluctuating heart rate. Hold Nadolol if HR <60.  -Recommend outpatient follow-up in GI clinic in 6 to 8 weeks after discharge. -GI will sign off. Call us back if needed.    LOS: 1 day   Otis Brace  MD, FACP 06/28/2020, 9:44 AM  Contact #  [phone]

## 2020-06-28 NOTE — Progress Notes (Signed)
CRITICAL VALUE ALERT  Critical Value:  Lactic acid 2.5  Date & Time Notied:  06/28/2020 @ 3:11pm  Provider Notified: Nada Libman 06/28/2020 @ 3:13pm   Orders Received/Actions taken: 06/28/2020 @ 3:15pm- no new orders, continue with currents meds/orders.

## 2020-06-28 NOTE — ED Notes (Signed)
Pt's CBG result was 237. Informed Katelynn - RN.

## 2020-06-28 NOTE — Progress Notes (Signed)
  Echocardiogram 2D Echocardiogram has been performed.  Stacey Harris 06/28/2020, 9:50 AM

## 2020-06-28 NOTE — Plan of Care (Signed)

## 2020-06-28 NOTE — Progress Notes (Signed)
PROGRESS NOTE   TORIN LACKLAND  [id number] DOB: 06/02/1929 DOA: 06/27/2020 PCP: [name], MD  Brief Narrative:  86 year old white female known history HTN HLD DM TY 2 CKD 3 baseline creatinine 1.5 myelodysplasia + chronic thrombocytopenia hypothyroid arthritis admission 9/21 through 04/02/2019 acute ischemic stroke with left hemiparesis and went to CIR subsequently  Seen in the office Jun 10, 2020 with ongoing cough UTI symptoms some hypoxia-also seen Apr 29, 2020 cardiology office found to have pulmonary edema recommended to continue usual dose of Lasix 20-symptoms at that time were attributed to hiatal hernia and reflux  Presented to the ED Jun 27, 2020 shortness of breath sats found to be in the low eighties--lactic acid found to be 4-given Levaquin Solu-Medrol bolus IV fluids  CT scan done on admission showed prominent interstitial markings?  ILD?  Pulmonary edema also found to have portal hypertension splenomegaly and esophageal varices with large hiatal hernia  Daughter states has been SOB since April--she is able to walk to RR and back and doesnt have enough breath to do these She actually has lost some weight Legs were swelling unti started lasix but got swollen again 2 days ago  Assessment & Plan:   Active Problems:   Hyperlipidemia associated with type 2 diabetes mellitus ([place])   Essential hypertension   Hypothyroid   Thrombocytopenia (HCC)   GERD (gastroesophageal reflux disease)   Normocytic anemia   Controlled type 2 diabetes mellitus with diabetic autonomic neuropathy (HCC)   CKD (chronic kidney disease)   H/O: CVA (cerebrovascular accident)   Myelodysplastic syndrome ([place])   Acute respiratory failure with hypoxia (HCC)   Abnormal CT scan of lung   Elevated troponin   Elevated lactic acid level   Other cirrhosis of liver ([place])   1. Abnormal CT?  ILD versus decompensated cirrhosis/heart failure a. Follow ANA sed rate CRP b. Hepatitis panel so far is  negative c. Continue doxycycline x 24 hours then stop if no fever WBC ? or PCT ? d. Continue Solu-Medrol 40 every 6 e. Suspect more inkeeping with vol overload--has been more short of breath over the past several months at home with minimal activity and in conjunction with her echo showing increasing PASP and right ventricular hypertrophy think this is more likely the cause f. Appreciate in advance pulmonology input 2.   CKD 3 a. Baseline creatinine about 1.3-1.5 b. Adding Lasix 20 IV daily c. repeat cxr of chest tomorrow d. Follow labs in a.m. 3. Elevated troponin a. Trend is flat and although elevated do not think this is related to cardiac issues b. EKG my review compared to prior done 03/30/2019 shows T wave inversions that are chronic c. Follow-up echocardiogram as above 4. Lactic acidosis on admission 5. New diagnosis of cirrhosis a. GI consulted-appreciate input going forward probably needs to be on nonselective beta-blocker etc. 6. Myelodysplasia with chronic thrombocytopenia and iron deficiency anemia a. Iron level is low and saturations are low as well b. We will replace orally given hemoglobin is above 10   DVT prophylaxis: SCDs Code Status: DNR confirmed at the bedside Family Communication: called daughter [phone] Disposition:  Status is: Inpatient  Remains inpatient appropriate because:Hemodynamically unstable, Persistent severe electrolyte disturbances and Altered mental status   Dispo: The patient is from: Home              Anticipated d/c is to: Home              Anticipated d/c date is: 2 days  Patient currently is not medically stable to d/c.  Consultants:   GI  Pulmonology  Procedures:   Antimicrobials: doxy    Subjective: Feels little bit tired [place] mobile at home but not allowed to do numerous things based on her family/daughter No chest pain No fever  Objective: Vitals:   06/28/20 0315 06/28/20 0600 06/28/20 0841 06/28/20  1158  BP: (!) 134/93 (!) 124/100 (!) 147/71 (!) 142/82  Pulse: 73 65 64 72  Resp: (!) 21 20 20  (!) 22  Temp:   (!) 97.5 F (36.4 C)   TempSrc:   Oral   SpO2: 99% 100% 100% 100%    Intake/Output Summary (Last 24 hours) at 06/28/2020 1247 Last data filed at 06/28/2020 0037 Gross per 24 hour  Intake 745 ml  Output --  Net 745 ml   There were no vitals filed for this visit.  Examination:  EOMI NCAT no focal deficit Chest decreased air entry posterolaterally with rales in the right posterior lung field Abdomen soft no rebound No lower extremity edema (may be trace) Neurologically intact moving all 4 limbs without deficit smile symmetric power 5/5  Data Reviewed: I have personally reviewed following labs and imaging studies Sodium 138 potassium 3.6 CO2 21 BUN/creatinine 20/1.3 Iron level 21 saturation was 11 Procalcitonin less than 0.10 Troponin trend flat in the 40 range WBC 4.7 hemoglobin 10.4 platelet 79 Echo is pending  Radiology Studies: DG Chest 2 View  Result Date: 06/27/2020 CLINICAL DATA:  Shortness of breath EXAM: CHEST - 2 VIEW COMPARISON:  June 10, 2020 and April 05, 2020 FINDINGS: There is persistent elevation of the right hemidiaphragm. Widespread interstitial opacity persists, likely in large part representing fibrosis. No appreciable new opacity compared to most recent study. Heart is slightly enlarged with pulmonary vascularity normal. No adenopathy. No bone lesions. IMPRESSION: Suspected widespread fibrosis, grossly stable. A degree of underlying pulmonary edema and/or interstitial pneumonia difficult to exclude in this circumstance. Stable cardiac silhouette. Stable elevation right hemidiaphragm. Electronically Signed   By: Lowella Grip III M.D.   On: 06/27/2020 12:23   CT Angio Chest PE W/Cm &/Or Wo Cm  Result Date: 06/27/2020 CLINICAL DATA:  PE suspected. EXAM: CT ANGIOGRAPHY CHEST WITH CONTRAST TECHNIQUE: Multidetector CT imaging of the chest  was performed using the standard protocol during bolus administration of intravenous contrast. Multiplanar CT image reconstructions and MIPs were obtained to evaluate the vascular anatomy. CONTRAST:  4mL OMNIPAQUE IOHEXOL 350 MG/ML SOLN COMPARISON:  None. FINDINGS: Cardiovascular: Evaluation is limited by respiratory motion artifact. Within that limitation, there is no central pulmonary embolus. The size of the main pulmonary artery is normal. Cardiomegaly with coronary artery calcification. The course and caliber of the aorta are normal. There is mild atherosclerotic calcification. Opacification decreased due to pulmonary arterial phase contrast bolus timing. Mediastinum/Nodes: --mild mediastinal adenopathy is noted. --hilar adenopathy is noted. -- No axillary lymphadenopathy. --there is mild left supraclavicular adenopathy. -- Normal thyroid gland where visualized. -  Unremarkable esophagus. Lungs/Pleura: Prominent interstitial lung markings and reticular airspace opacities are noted. There are areas of ground-glass opacification in the bilateral upper lobes, right worse than left. There is bronchial wall thickening and mucus plugging bilaterally. There is no pneumothorax. There is no large pleural effusion. Upper Abdomen: Contrast bolus timing is not optimized for evaluation of the abdominal organs. There is recanalization of the umbilical vein in addition to splenomegaly, both consistent with cirrhosis with stigmata of portal hypertension. There is a large hiatal hernia with multiple esophageal varices.  Musculoskeletal: No chest wall abnormality. No bony spinal canal stenosis. Review of the MIP images confirms the above findings. IMPRESSION: 1. Evaluation is limited by respiratory motion artifact. Within that limitation, there is no central pulmonary embolus. 2. Prominent interstitial lung markings and reticular airspace opacities, with areas of ground-glass opacification in the bilateral upper lobes, right  worse than left. Findings are nonspecific but can be seen in patients with interstitial lung disease or pulmonary edema. A superimposed infectious process is not excluded. 3. Bronchial wall thickening and mucus plugging bilaterally. Findings can be seen in patients with reactive or infectious bronchiolitis. 4. Cardiomegaly with coronary artery disease. 5. Cirrhosis with stigmata of portal hypertension including splenomegaly and esophageal varices. 6. Large hiatal. Aortic Atherosclerosis (ICD10-I70.0). Electronically Signed   By: Constance Holster M.D.   On: 06/27/2020 16:44   ECHOCARDIOGRAM COMPLETE  Result Date: 06/28/2020    ECHOCARDIOGRAM REPORT   Patient Name:   TAYLAN GINGERICH Date of Exam: 06/28/2020 Medical Rec #:  [id number]      Height:       62.0 in Accession #:    [id number]     Weight:       154.2 lb Date of Birth:  [birth year]     BSA:          1.712 m Patient Age:    72 years       BP:           147/71 mmHg Patient Gender: F              HR:           86 bpm. Exam Location:  Inpatient Procedure: 2D Echo Indications:    Elevated Troponin  History:        Patient has prior history of Echocardiogram examinations, most                 recent 03/30/2019. CHF, Stroke; Risk Factors:Diabetes,                 Dyslipidemia and Hypertension.  Sonographer:    Dustin Flock Referring Phys: [place]  1. Left ventricular ejection fraction, by estimation, is 60 to 65%. The left ventricle has normal function. The left ventricle has no regional wall motion abnormalities. Left ventricular diastolic parameters are consistent with Grade II diastolic dysfunction (pseudonormalization). Elevated left ventricular end-diastolic pressure. The E/e' is 46.  2. Right ventricular systolic function is mildly reduced. The right ventricular size is normal. There is mildly elevated pulmonary artery systolic pressure. The estimated right ventricular systolic pressure is [id number] mmHg.  3. Left atrial size  was mildly dilated.  4. The mitral valve is abnormal. Mild mitral valve regurgitation.  5. Annular calcification. The tricuspid valve is abnormal.  6. The aortic valve is tricuspid. Aortic valve regurgitation is trivial. Mild aortic valve sclerosis is present, with no evidence of aortic valve stenosis.  7. The inferior vena cava is normal in size with greater than 50% respiratory variability, suggesting right atrial pressure of 3 mmHg. Comparison(s): Changes from prior study are noted. 03/30/19: LVEF 60-65%, normal RV systolic function. FINDINGS  Left Ventricle: Left ventricular ejection fraction, by estimation, is 60 to 65%. The left ventricle has normal function. The left ventricle has no regional wall motion abnormalities. The left ventricular internal cavity size was normal in size. There is  no left ventricular hypertrophy. Left ventricular diastolic parameters are consistent with Grade II diastolic dysfunction (pseudonormalization). Elevated left ventricular end-diastolic pressure. The  E/e' is 16. Right Ventricle: The right ventricular size is normal. No increase in right ventricular wall thickness. Right ventricular systolic function is mildly reduced. There is mildly elevated pulmonary artery systolic pressure. The tricuspid regurgitant velocity  is 2.92 m/s, and with an assumed right atrial pressure of 3 mmHg, the estimated right ventricular systolic pressure is 16.1 mmHg. Left Atrium: Left atrial size was mildly dilated. Right Atrium: Right atrial size was normal in size. Pericardium: There is no evidence of pericardial effusion. Mitral Valve: The mitral valve is abnormal. Mild mitral annular calcification. Mild mitral valve regurgitation. Tricuspid Valve: Annular calcification. The tricuspid valve is abnormal. Tricuspid valve regurgitation is mild. Aortic Valve: The aortic valve is tricuspid. Aortic valve regurgitation is trivial. Aortic regurgitation PHT measures 743 msec. Mild aortic valve sclerosis is  present, with no evidence of aortic valve stenosis. Pulmonic Valve: The pulmonic valve was grossly normal. Pulmonic valve regurgitation is trivial. Aorta: The aortic root and ascending aorta are structurally normal, with no evidence of dilitation. Venous: The inferior vena cava is normal in size with greater than 50% respiratory variability, suggesting right atrial pressure of 3 mmHg. IAS/Shunts: No atrial level shunt detected by color flow Doppler.  LEFT VENTRICLE PLAX 2D LVIDd:         4.70 cm  Diastology LVIDs:         3.00 cm  LV e' medial:    5.77 cm/s LV PW:         1.00 cm  LV E/e' medial:  18.5 LV IVS:        1.00 cm  LV e' lateral:   7.29 cm/s LVOT diam:     1.90 cm  LV E/e' lateral: 14.7 LV SV:         64 LV SV Index:   38 LVOT Area:     2.84 cm  RIGHT VENTRICLE RV Basal diam:  3.20 cm RV S prime:     5.77 cm/s TAPSE (M-mode): 2.1 cm LEFT ATRIUM             Index       RIGHT ATRIUM           Index LA diam:        3.60 cm 2.10 cm/m  RA Area:     12.30 cm LA Vol (A2C):   56.2 ml 32.83 ml/m RA Volume:   27.00 ml  15.77 ml/m LA Vol (A4C):   42.2 ml 24.65 ml/m LA Biplane Vol: 52.9 ml 30.90 ml/m  AORTIC VALVE LVOT Vmax:   93.30 cm/s LVOT Vmean:  65.900 cm/s LVOT VTI:    0.227 m AI PHT:      743 msec  AORTA Ao Root diam: 2.60 cm MITRAL VALVE                TRICUSPID VALVE MV Area (PHT): 2.62 cm     TR Peak grad:   34.1 mmHg MV Decel Time: 289 msec     TR Vmax:        292.00 cm/s MV E velocity: 107.00 cm/s MV A velocity: 85.30 cm/s   SHUNTS MV E/A ratio:  1.25         Systemic VTI:  0.23 m                             Systemic Diam: 1.90 cm Lyman Bishop MD Electronically signed by Lyman Bishop MD Signature Date/Time: 06/28/2020/10:21:03 AM  Final    VAS [place] LOWER EXTREMITY VENOUS (DVT)  Result Date: 06/28/2020  Lower Venous DVT Study Indications: Shortness of breath.  Comparison Study: No prior studies. Performing Technologist: Rogelia Rohrer  Examination Guidelines: A complete evaluation includes B-mode  imaging, spectral Doppler, color Doppler, and power Doppler as needed of all accessible portions of each vessel. Bilateral testing is considered an integral part of a complete examination. Limited examinations for reoccurring indications may be performed as noted. The reflux portion of the exam is performed with the patient in reverse Trendelenburg.  +---------+---------------+---------+-----------+----------+--------------+ RIGHT    CompressibilityPhasicitySpontaneityPropertiesThrombus Aging +---------+---------------+---------+-----------+----------+--------------+ CFV      Full           Yes      Yes                                 +---------+---------------+---------+-----------+----------+--------------+ SFJ      Full                                                        +---------+---------------+---------+-----------+----------+--------------+ FV Prox  Full           Yes      Yes                                 +---------+---------------+---------+-----------+----------+--------------+ FV Mid   Full           Yes      Yes                                 +---------+---------------+---------+-----------+----------+--------------+ FV DistalFull           Yes      Yes                                 +---------+---------------+---------+-----------+----------+--------------+ PFV      Full                                                        +---------+---------------+---------+-----------+----------+--------------+ POP      Full           Yes      Yes                                 +---------+---------------+---------+-----------+----------+--------------+ PTV      Full                                                        +---------+---------------+---------+-----------+----------+--------------+ PERO     Full                                                        +---------+---------------+---------+-----------+----------+--------------+    +---------+---------------+---------+-----------+----------+--------------+  LEFT     CompressibilityPhasicitySpontaneityPropertiesThrombus Aging +---------+---------------+---------+-----------+----------+--------------+ CFV      Full           Yes      Yes                                 +---------+---------------+---------+-----------+----------+--------------+ SFJ      Full                                                        +---------+---------------+---------+-----------+----------+--------------+ FV Prox  Full           Yes      Yes                                 +---------+---------------+---------+-----------+----------+--------------+ FV Mid   Full           Yes      Yes                                 +---------+---------------+---------+-----------+----------+--------------+ FV DistalFull           Yes      Yes                                 +---------+---------------+---------+-----------+----------+--------------+ PFV      Full                                                        +---------+---------------+---------+-----------+----------+--------------+ POP      Full           Yes      Yes                                 +---------+---------------+---------+-----------+----------+--------------+ PTV      Full                                                        +---------+---------------+---------+-----------+----------+--------------+ PERO     Full                                                        +---------+---------------+---------+-----------+----------+--------------+     Summary: RIGHT: - There is no evidence of deep vein thrombosis in the lower extremity.  - No cystic structure found in the popliteal fossa.  LEFT: - There is no evidence of deep vein thrombosis in the lower extremity.  - No cystic structure found in the popliteal fossa.  *See table(s) above for measurements and observations.    Preliminary  Scheduled Meds: . insulin aspart  0-9 Units Subcutaneous Q4H  . levothyroxine  50 mcg Oral Q0600  . methylPREDNISolone (SOLU-MEDROL) injection  40 mg Intravenous Q6H  . pantoprazole  40 mg Oral Daily  . rosuvastatin  10 mg Oral Daily  . sodium chloride flush  3 mL Intravenous Q12H   Continuous Infusions: . sodium chloride    . doxycycline (VIBRAMYCIN) IV       LOS: 1 day    Time spent: [place], MD [facility] To contact the attending provider between 7A-7P or the covering provider during after hours 7P-7A, please log into the web site [email or web address] and access using universal [place] password for that web site. If you do not have the password, please call the hospital operator.  06/28/2020, 12:47 PM

## 2020-06-28 NOTE — ED Notes (Signed)
Attempted to call report. RN unable to take report.  

## 2020-06-28 NOTE — ED Notes (Signed)
Pt in vascular.

## 2020-06-28 NOTE — CV Procedure (Signed)
  BLE venous duplex has been completed.  Results can be found under chart review under CV PROC. 06/28/2020 10:01 AM Temesgen Weightman RVT, RDMS

## 2020-06-29 ENCOUNTER — Inpatient Hospital Stay (HOSPITAL_COMMUNITY): Payer: Medicare HMO

## 2020-06-29 LAB — CBC WITH DIFFERENTIAL/PLATELET
Abs Immature Granulocytes: 0.03 10*3/uL (ref 0.00–0.07)
Basophils Absolute: 0 10*3/uL (ref 0.0–0.1)
Basophils Relative: 0 %
Eosinophils Absolute: 0 10*3/uL (ref 0.0–0.5)
Eosinophils Relative: 0 %
HCT: 30 % — ABNORMAL LOW (ref 36.0–46.0)
Hemoglobin: 9.8 g/dL — ABNORMAL LOW (ref 12.0–15.0)
Immature Granulocytes: 0 %
Lymphocytes Relative: 13 %
Lymphs Abs: 1.1 10*3/uL (ref 0.7–4.0)
MCH: 32.6 pg (ref 26.0–34.0)
MCHC: 32.7 g/dL (ref 30.0–36.0)
MCV: 99.7 fL (ref 80.0–100.0)
Monocytes Absolute: 0.4 10*3/uL (ref 0.1–1.0)
Monocytes Relative: 5 %
Neutro Abs: 6.6 10*3/uL (ref 1.7–7.7)
Neutrophils Relative %: 82 %
Platelets: 85 10*3/uL — ABNORMAL LOW (ref 150–400)
RBC: 3.01 MIL/uL — ABNORMAL LOW (ref 3.87–5.11)
RDW: 15.9 % — ABNORMAL HIGH (ref 11.5–15.5)
WBC: 8.1 10*3/uL (ref 4.0–10.5)
nRBC: 0 % (ref 0.0–0.2)

## 2020-06-29 LAB — COMPREHENSIVE METABOLIC PANEL
ALT: 21 U/L (ref 0–44)
AST: 43 U/L — ABNORMAL HIGH (ref 15–41)
Albumin: 2 g/dL — ABNORMAL LOW (ref 3.5–5.0)
Alkaline Phosphatase: 65 U/L (ref 38–126)
Anion gap: 9 (ref 5–15)
BUN: 35 mg/dL — ABNORMAL HIGH (ref 8–23)
CO2: 22 mmol/L (ref 22–32)
Calcium: 7.8 mg/dL — ABNORMAL LOW (ref 8.9–10.3)
Chloride: 104 mmol/L (ref 98–111)
Creatinine, Ser: 1.41 mg/dL — ABNORMAL HIGH (ref 0.44–1.00)
GFR, Estimated: 36 mL/min — ABNORMAL LOW (ref >60)
Glucose, Bld: 164 mg/dL — ABNORMAL HIGH (ref 70–99)
Potassium: 4 mmol/L (ref 3.5–5.1)
Sodium: 135 mmol/L (ref 135–145)
Total Bilirubin: 0.8 mg/dL (ref 0.3–1.2)
Total Protein: 5.8 g/dL — ABNORMAL LOW (ref 6.5–8.1)

## 2020-06-29 LAB — PROCALCITONIN: Procalcitonin: 0.12 ng/mL

## 2020-06-29 LAB — ANA W/REFLEX IF POSITIVE: Anti Nuclear Antibody (ANA): NEGATIVE

## 2020-06-29 LAB — RHEUMATOID FACTOR: Rheumatoid fact SerPl-aCnc: 10.7 IU/mL (ref <14.0)

## 2020-06-29 LAB — GLUCOSE, CAPILLARY
Glucose-Capillary: 174 mg/dL — ABNORMAL HIGH (ref 70–99)
Glucose-Capillary: 168 mg/dL — ABNORMAL HIGH (ref 70–99)
Glucose-Capillary: 165 mg/dL — ABNORMAL HIGH (ref 70–99)
Glucose-Capillary: 307 mg/dL — ABNORMAL HIGH (ref 70–99)
Glucose-Capillary: 413 mg/dL — ABNORMAL HIGH (ref 70–99)
Glucose-Capillary: 386 mg/dL — ABNORMAL HIGH (ref 70–99)

## 2020-06-29 MED ORDER — FUROSEMIDE 10 MG/ML IJ SOLN
20.0000 mg | Freq: Once | INTRAMUSCULAR | Status: AC
  Administered 2020-06-29: 20 mg via INTRAVENOUS
  Filled 2020-06-29: qty 2

## 2020-06-29 MED ORDER — FUROSEMIDE 10 MG/ML IJ SOLN: 40.0000 mg | Freq: Every day | INTRAMUSCULAR | Status: DC

## 2020-06-29 NOTE — Plan of Care (Signed)

## 2020-06-29 NOTE — Evaluation (Signed)
Physical Therapy Evaluation Patient Details Name: Stacey Harris MRN: [id number] DOB: 07-05-1929 Today's Date: 06/29/2020   History of Present Illness  86 y.o. female presenting with progressive cough, generalized fatigue, SOB x1 week and hypoxia. CXR (+) possible interstitial lung disease. Patient also with abnormal CT with ILD versus decompensated cirrhosis/heart failure, elevated troponin, and lactic acidosis. Patient admitted with acute on chronic respiratory failure. PMHx significant for DM, recurrent UTIs, HLD, HTN, thyroid disease, Hx of CVA, CHF, CKD, and MDS.    Clinical Impression  PTA pt living with daughter in level entry home with bed and bath on first floor. Pt has 24 hour care either from daughter or HHA. Pt ambulates with RW and is independent with dressing. HHA assists with bathing and daughter provides iADLs. Pt is currently limited in safe mobility by decreased strength, balance and endurance in presence of increased O2 demand. Pt is min guard for bed mobility, min A for transfers and min A for ambulation of     Follow Up Recommendations Home health PT;Supervision/Assistance - 24 hour    Equipment Recommendations  None recommended by PT (has necessary equipment)       Precautions / Restrictions Precautions Precautions: None Restrictions Weight Bearing Restrictions: No      Mobility  Bed Mobility Overal bed mobility: Needs Assistance (Simultaneous filing. User may not have seen previous data.) Bed Mobility: Sit to Supine (Simultaneous filing. User may not have seen previous data.)     Supine to sit: Min guard;HOB elevated Sit to supine: Supervision   General bed mobility comments: Patient able to advance BLE from EOB to bed surface without external assist. S for safety. (Simultaneous filing. User may not have seen previous data.)    Transfers Overall transfer level: Needs assistance (Simultaneous filing. User may not have seen previous data.) Equipment used:  Rolling walker (2 wheeled) (Simultaneous filing. User may not have seen previous data.) Transfers: Sit to/from Stand (Simultaneous filing. User may not have seen previous data.) Sit to Stand: Min guard (Simultaneous filing. User may not have seen previous data.)         General transfer comment: Min guard for steadying x2 trials. Cues for hand placement. (Simultaneous filing. User may not have seen previous data.)  Ambulation/Gait Ambulation/Gait assistance: Min assist Gait Distance (Feet): 5 Feet Assistive device: Rolling walker (2 wheeled) Gait Pattern/deviations: Step-through pattern;Decreased step length - right;Decreased step length - left;Trunk flexed;Shuffle Gait velocity: slowed Gait velocity interpretation: <1.31 ft/sec, indicative of household ambulator General Gait Details: min A for steadying with stepping to recliner, vc for proximity to RW and upright posture        Balance Overall balance assessment: Needs assistance (Simultaneous filing. User may not have seen previous data.) Sitting-balance support: No upper extremity supported;Feet supported (Simultaneous filing. User may not have seen previous data.) Sitting balance-Leahy Scale: Good (Simultaneous filing. User may not have seen previous data.) Sitting balance - Comments: Patient able to maintain dynamic sitting balance at EOB without LOB.   Standing balance support: Bilateral upper extremity supported;During functional activity (Simultaneous filing. User may not have seen previous data.) Standing balance-Leahy Scale: Fair (Simultaneous filing. User may not have seen previous data.) Standing balance comment: Patient able to maintain standing balance with unilateral UE support on RW and no LOB during toileting hygiene. (Simultaneous filing. User may not have seen previous data.)  Pertinent Vitals/Pain Pain Assessment: No/denies pain    Home Living Family/patient expects to be  discharged to:: Private residence Living Arrangements: Children Available Help at Discharge: Family;Other (Comment) (Sitter if family not available) Type of Home: House Home Access: Level entry     Home Layout: Able to live on main level with bedroom/bathroom Home Equipment: Walker - 2 wheels;Cane - single point;Bedside commode;Other (comment);Hand held shower head (shower stool)      Prior Function Level of Independence: Needs assistance   Gait / Transfers Assistance Needed: Mod I with use of RW for home mobility.Wc in community dwellings.  ADL's / Homemaking Assistance Needed: Assist from family or care aid for bathing/dressing. Patient reports requiring assist for LB bathing but can don LB clothing with set-up assist.        Hand Dominance   Dominant Hand: Right    Extremity/Trunk Assessment   Upper Extremity Assessment Upper Extremity Assessment: Defer to OT evaluation    Lower Extremity Assessment Lower Extremity Assessment: Defer to PT evaluation;Generalized weakness    Cervical / Trunk Assessment Cervical / Trunk Assessment: Normal  Communication   Communication: HOH  Cognition Arousal/Alertness: Awake/alert Behavior During Therapy: WFL for tasks assessed/performed Overall Cognitive Status: Within Functional Limits for tasks assessed                                        General Comments General comments (skin integrity, edema, etc.): in supine HR 68 bpm, SaO2 on 3L 100%O2, BP 142/114, with mobility HR increased to high 80s, SaO2 dropped to 92%O2, and BP increased to 160/78        Assessment/Plan    PT Assessment Patient needs continued PT services  PT Problem List Decreased strength;Decreased activity tolerance;Decreased balance;Decreased mobility       PT Treatment Interventions DME instruction;Gait training;Functional mobility training;Therapeutic activities;Therapeutic exercise;Balance training;Cognitive remediation;Patient/family  education    PT Goals (Current goals can be found in the Care Plan section)  Acute Rehab PT Goals Patient Stated Goal: To get better. (Simultaneous filing. User may not have seen previous data.) PT Goal Formulation: With patient Time For Goal Achievement: 07/13/20 Potential to Achieve Goals: Fair    Frequency Min 3X/week    AM-PAC PT "6 Clicks" Mobility  Outcome Measure Help needed turning from your back to your side while in a flat bed without using bedrails?: None Help needed moving from lying on your back to sitting on the side of a flat bed without using bedrails?: A Little Help needed moving to and from a bed to a chair (including a wheelchair)?: A Little Help needed standing up from a chair using your arms (e.g., wheelchair or bedside chair)?: A Little Help needed to walk in hospital room?: A Little Help needed climbing 3-5 steps with a railing? : A Lot 6 Click Score: 18    End of Session Equipment Utilized During Treatment: Gait belt;Oxygen Activity Tolerance: Patient tolerated treatment well Patient left: in chair;with call bell/phone within reach;with chair alarm set Nurse Communication: Mobility status PT Visit Diagnosis: Unsteadiness on feet (R26.81);Other abnormalities of gait and mobility (R26.89);Muscle weakness (generalized) (M62.81);Difficulty in walking, not elsewhere classified (R26.2)    Time: 7829-5621 PT Time Calculation (min) (ACUTE ONLY): 26 min   Charges:   PT Evaluation $PT Eval Moderate Complexity: 1 Mod PT Treatments $Therapeutic Activity: 8-22 mins        Marcella Charlson B. [employer] PT,  DPT Acute Rehabilitation Services Pager [phone] Office [phone]   [place] 06/29/2020, 10:08 AM

## 2020-06-29 NOTE — Progress Notes (Signed)
PROGRESS NOTE  Stacey Harris  DOB: January 17, 1929  PCP: [name], MD ZOX:[id number]  DOA: 06/27/2020  LOS: 2 days   Chief complaint: Shortness of breath  Brief narrative: Stacey Harris is a 86 y.o. female with PMH significant for DM2, HLD, HTN, CKD three, baseline creatinine 1.5, myelodysplasia, chronic thrombocytopenia, hypothyroidism, osteoarthritis.  She was recently admitted 9/21-9/24 with acute ischemic stroke with left hemiparesis and went to CIR subsequently Patient presented to the ED on 12/20 with complaint of shortness of breath..  Also reported weight gain, worsening pedal edema, progressive decline in physical strength. She was noted to be hypoxic to 80s, lactic acid level elevated to four. She was started on IV Levaquin, IV Solu-Medrol and given boluses of IV fluid.  CT scan done on admission showed prominent interstitial markings?  ILD?  Pulmonary edema.  Also found to have portal hypertension splenomegaly and esophageal varices with large hiatal hernia  Subjective: Patient was seen and examined this afternoon.  Elderly Caucasian female.  Propped up in bed.  Not in distress.  On 2 L oxygen by nasal cannula. Son at bedside.  Assessment/Plan: Acute respiratory failure with hypoxia Acute exacerbation of diastolic congestive heart failure -Currently on Lasix 20 mg daily. -12/21, echo with EF 60 to 81%, grade 2 diastolic dysfunction, -Net IO Since Admission: 295 mL [06/29/20 1609].  I will increase Lasix to 40 mg daily. -Continue to monitor for daily intake output, weight, blood pressure, BNP, renal function and electrolytes. Recent Labs  Lab 06/27/20 1211 06/27/20 2012 06/27/20 2018 06/28/20 0245 06/29/20 0112  BNP 197.0*  --   --   --   --   BUN 20  --   --  20 35*  CREATININE 1.48*  --   --  1.36* 1.41*  K 3.8  --  3.6 3.6 4.0  MG  --  1.4*  --  2.1  --    COPD -Not on supplemental oxygen at home. -On admission started on empiric antibiotic and  steroids. -Procalcitonin negative on admission.  I do not think this is COPD exacerbation.  Okay to stop antibiotics and steroids.  Recent Labs  Lab 06/27/20 1211 06/27/20 1345 06/27/20 2012 06/27/20 2340 06/28/20 0245 06/28/20 1439 06/29/20 0112  WBC 5.6  --   --   --  4.7  --  8.1  LATICACIDVEN 4.0* 2.8*  --  1.7  --  2.5*  --   PROCALCITON  --   --  0.10  --  <0.10 0.13 0.12   Abnormal CT scan of chest -Bilateral prominent interstitial markings. -Likely due to pulmonary edema. -Not on supplemental oxygen at home prior to admission.  Liver cirrhosis -With portal hypertension, splenomegaly and esophageal varices. -Liver enzymes stable.  Hepatitis panel negative. -Liver ultrasound. -GI consult appreciated.  Recommended nadolol 10 mg daily for prophylaxis of esophageal varices.  To hold if heart rate less than sixty. -Patient to follow-up in GI clinic in 6 to 8 weeks.  Myelodysplasia with chronic thrombocytopenia and iron deficiency anemia Recent Labs    06/10/20 1317 06/27/20 1211 06/27/20 2012 06/27/20 2018 06/28/20 0245 06/29/20 0112  HGB 11.8* 10.9*  --  9.5* 10.4* 9.8*  MCV 99.5 104.1*  --   --  103.2* 99.7  VITAMINB12  --   --   --   --  1,152*  --   FOLATE  --   --   --   --  33.7  --   FERRITIN  --   --  152  --   --   --   TIBC  --   --   --   --  185*  --   IRON  --   --   --   --  21*  --   RETICCTPCT  --   --   --   --  2.4  --    Recent Labs  Lab 06/27/20 1211 06/28/20 0245 06/29/20 0112  PLT 81* 79* 85*   Mobility: Home health PT recommended by physical therapy Code Status:   Code Status: DNR  Nutritional status: There is no height or weight on file to calculate BMI.     Diet Order            Diet Carb Modified Fluid consistency: Thin; Room service appropriate? Yes  Diet effective now                 DVT prophylaxis: Place and maintain sequential compression device Start: 06/27/20 2338   Antimicrobials:  None Fluid: None Consultants:  GI Family Communication:  Son at bedside  Status is: Inpatient  Remains inpatient appropriate because: Ongoing diuresis   Dispo: The patient is from: Home              Anticipated d/c is to: Home              Anticipated d/c date is: 2 days              Patient currently is not medically stable to d/c.       Infusions:  . sodium chloride      Scheduled Meds: . ferrous sulfate  325 mg Oral Q breakfast  . furosemide  20 mg Intravenous Daily  . insulin aspart  0-9 Units Subcutaneous Q4H  . levothyroxine  50 mcg Oral Q0600  . pantoprazole  40 mg Oral Daily  . predniSONE  20 mg Oral QAC breakfast  . rosuvastatin  10 mg Oral Daily  . sodium chloride flush  3 mL Intravenous Q12H    Antimicrobials: Anti-infectives (From admission, onward)   Start     Dose/Rate Route Frequency Ordered Stop   06/28/20 1600  doxycycline (VIBRAMYCIN) 100 mg in sodium chloride 0.9 % 250 mL IVPB  Status:  Discontinued        100 mg 125 mL/hr over 120 Minutes Intravenous Every 12 hours 06/28/20 1514 06/29/20 1430   06/28/20 1000  doxycycline (VIBRAMYCIN) 100 mg in sodium chloride 0.9 % 250 mL IVPB  Status:  Discontinued        100 mg 125 mL/hr over 120 Minutes Intravenous Every 12 hours 06/27/20 2324 06/28/20 1514   06/27/20 1315  levofloxacin (LEVAQUIN) IVPB 750 mg        750 mg 100 mL/hr over 90 Minutes Intravenous  Once 06/27/20 1311 06/27/20 1736      PRN meds: sodium chloride, acetaminophen **OR** acetaminophen, albuterol, dextromethorphan, HYDROcodone-acetaminophen, sodium chloride flush   Objective: Vitals:   06/29/20 0724 06/29/20 1144  BP: (!) 162/76 (!) 181/93  Pulse: (!) 59 65  Resp: 16 17  Temp: 97.8 F (36.6 C) (!) 96.8 F (36 C)  SpO2: 99% 100%    Intake/Output Summary (Last 24 hours) at 06/29/2020 1603 Last data filed at 06/29/2020 0418 Gross per 24 hour  Intake --  Output 450 ml  Net -450 ml   There were no vitals filed for this visit. Weight change:  There is  no height or weight on file to  calculate BMI.   Physical Exam: General exam: Pleasant, elderly Caucasian female.  Not in distress Skin: No rashes, lesions or ulcers. HEENT: Atraumatic, normocephalic, no obvious bleeding Lungs: Clear to auscultation bilaterally CVS: Regular rate and rhythm, no murmur GI/Abd soft, nontender, nondistended, bowel sound present CNS: Alert, awake, oriented to place and person Psychiatry: Mood appropriate Extremities: Trace bilateral pedal edema, no calf tenderness  Data Review: I have personally reviewed the laboratory data and studies available.  Recent Labs  Lab 06/27/20 1211 06/27/20 2018 06/28/20 0245 06/29/20 0112  WBC 5.6  --  4.7 8.1  NEUTROABS 4.0  --  3.8 6.6  HGB 10.9* 9.5* 10.4* 9.8*  HCT 35.2* 28.0* 32.3* 30.0*  MCV 104.1*  --  103.2* 99.7  PLT 81*  --  79* 85*   Recent Labs  Lab 06/27/20 1211 06/27/20 2012 06/27/20 2018 06/28/20 0245 06/29/20 0112  NA 140  --  144 138 135  K 3.8  --  3.6 3.6 4.0  CL 107  --   --  107 104  CO2 19*  --   --  21* 22  GLUCOSE 375*  --   --  296* 164*  BUN 20  --   --  20 35*  CREATININE 1.48*  --   --  1.36* 1.41*  CALCIUM 8.5*  --   --  8.2* 7.8*  MG  --  1.4*  --  2.1  --   PHOS  --   --   --  3.4  --     F/u labs ordered  Signed, Terrilee Croak, MD [facility] 06/29/2020

## 2020-06-29 NOTE — TOC Initial Note (Addendum)
Transition of Care [facility]) - Initial/Assessment Note    Patient Details  Name: Stacey Harris MRN: [id number] Date of Birth: [birth year]  Transition of Care [facility]) CM/SW Contact:    Bethann Berkshire, [place] Phone Number: 06/29/2020, 12:27 PM  Clinical Narrative:                 CSW met with pt and pt son bedside. Pt son called pt daughter and put on speaker. Pt lives with daughter in [place] area. Has walker, 3 in 1, and lift. Pt also has an aide come in 3x/week. CSW discussed HH PT and RN. Family is agreeable. Pt has had it in the past and daughter would like to go with that agency. She will find out what company was used before and contact CSW with company name.   1320: Pt daughter notified CSW they prefer [facility]  1627: CSW arranged [place] PT/OT/RN with [facility].   Expected Discharge Plan: [place] Barriers to Discharge: Continued Medical Work up   Patient Goals and CMS Choice Patient states their goals for this hospitalization and ongoing recovery are:: Home with [facility]      Expected Discharge Plan and Services Expected Discharge Plan: [place]       Living arrangements for the past 2 months: Single Family Home                                      Prior Living Arrangements/Services Living arrangements for the past 2 months: Single Family Home Lives with:: Adult Children Patient language and need for interpreter reviewed:: Yes        Need for Family Participation in Patient Care: Yes (Comment) Care giver support system in place?: Yes (comment) Current home services: DME,Homehealth aide Criminal Activity/Legal Involvement Pertinent to Current Situation/Hospitalization: No - Comment as needed  Activities of Daily Living Home Assistive Devices/Equipment: [place] Rile (specify type) ADL Screening (condition at time of admission) Patient's cognitive ability adequate to safely complete daily activities?: Yes Is the patient deaf or have  difficulty hearing?: Yes Does the patient have difficulty seeing, even when wearing glasses/contacts?: No Does the patient have difficulty concentrating, remembering, or making decisions?: No Patient able to express need for assistance with ADLs?: Yes Does the patient have difficulty dressing or bathing?: No Independently performs ADLs?: Yes (appropriate for developmental age) Does the patient have difficulty walking or climbing stairs?: Yes Weakness of Legs: Both Weakness of Arms/Hands: None  Permission Sought/Granted                  Emotional Assessment Appearance:: Appears stated age   Affect (typically observed): Accepting Orientation: : Oriented to Self,Oriented to Place,Oriented to  Time,Oriented to Situation Alcohol / Substance Use: Not Applicable Psych Involvement: No (comment)  Admission diagnosis:  Hypoxemia [R09.02] Cirrhosis ([place]) [K74.60] Acute respiratory failure with hypoxia (HCC) [J96.01] PNA (pneumonia) [J18.9] Patient Active Problem List   Diagnosis Date Noted  . Acute respiratory failure with hypoxia ([place]) 06/27/2020  . Abnormal CT scan of lung 06/27/2020  . Elevated troponin 06/27/2020  . Elevated lactic acid level 06/27/2020  . Other cirrhosis of liver ([place]) 06/27/2020  . Abnormal chest x-ray 06/10/2020  . Acute cystitis 05/25/2020  . Cough 05/25/2020  . CHF (congestive heart failure) ([place]) 04/12/2020  . Cerumen impaction 07/31/2019  . Iron deficiency 04/29/2019  . Carpal tunnel syndrome of left wrist   .  Myelodysplastic syndrome ([place])   . H/O: CVA (cerebrovascular accident)   . Hypomagnesemia   . CKD (chronic kidney disease) 03/30/2019  . Recurrent UTI (urinary tract infection) 09/17/2018  . Fall at home 09/17/2018  . Frequent urination 04/29/2018  . Elevated serum creatinine 04/11/2018  . Dysuria 11/12/2017  . Dyspnea on exertion 11/12/2017  . History of fall 08/27/2016  . Controlled type 2 diabetes mellitus with diabetic autonomic  neuropathy ([place]) 02/24/2016  . Routine general medical examination at a health care facility 02/12/2016  . MDS (myelodysplastic syndrome) ([place]) 10/24/2015  . Normocytic anemia 02/11/2015  . GERD (gastroesophageal reflux disease) 08/06/2014  . Encounter for Medicare annual wellness exam 08/03/2013  . Thrombocytopenia ([place]) 08/03/2013  . Insomnia 01/26/2013  . Hypothyroid 09/17/2011  . Lumbar spinal stenosis 06/21/2011    Class: Present on Admission  . BACK PAIN, LUMBAR 08/23/2010  . POSTNASAL DRIP 08/23/2010  . OSTEOARTHRITIS, HIP 03/16/2009  . BASAL CELL CARCINOMA, FACE 07/01/2007  . BASAL CELL CARCINOMA, FACE 07/01/2007  . DIABETIC PERIPHERAL NEUROPATHY 09/24/2006  . Hyperlipidemia associated with type 2 diabetes mellitus ([place]) 09/24/2006  . Essential hypertension 09/24/2006  . ROSACEA 09/24/2006  . INCONTINENCE, URGE 09/24/2006  . COLONOSCOPY AND REMOVAL OF LESION, HX OF 06/09/2003   PCP:  [name], MD Pharmacy:   [id number] [place] [id number]: [phone]: [phone]    Social Determinants of Health (SDOH) Interventions    Readmission Risk Interventions No flowsheet data found.

## 2020-06-29 NOTE — Evaluation (Signed)
Occupational Therapy Evaluation Patient Details Name: Stacey Harris MRN: [id number] DOB: [birth year] Today's Date: 06/29/2020    History of Present Illness 86 y.o. female presenting with progressive cough, generalized fatigue, SOB x1 week and hypoxia. CXR (+) possible interstitial lung disease. Patient also with abnormal CT with ILD versus decompensated cirrhosis/heart failure, elevated troponin, and lactic acidosis. Patient admitted with acute on chronic respiratory failure. PMHx significant for DM, recurrent UTIs, HLD, HTN, thyroid disease, Hx of CVA, CHF, CKD, and MDS.   Clinical Impression   PTA patient was living with family and was Mod I for household mobility with use of RW. Use of transport chair vs. wc in community. Patient reports requiring assist for LB bathing and IADLs but reports Mod I with toilet tranfers and toileting/hygiene/clothing management prior to admission. Patient currently demonstrates deficits in cardiopulmonary endurance requiring supplemental O2 via [place], static/dynamic standing balance, and activity tolerance with 3/4 DOE with light activity. Patient would benefit from continued acute OT services to maximize safety and independence with self-care tasks in prep for safe d/c home with family and 24hr supervision/assist. Recommendation for [place].     Follow Up Recommendations  Home health OT;Supervision/Assistance - 24 hour    Equipment Recommendations  None recommended by OT (Patient has necessary DME)    Recommendations for Other Services       Precautions / Restrictions Precautions Precautions: None Restrictions Weight Bearing Restrictions: No      Mobility Bed Mobility Overal bed mobility: Needs Assistance (Simultaneous filing. User may not have seen previous data.) Bed Mobility: Sit to Supine (Simultaneous filing. User may not have seen previous data.)     Supine to sit: Min guard;HOB elevated Sit to supine: Supervision   General bed mobility  comments: Patient able to advance BLE from EOB to bed surface without external assist. S for safety. (Simultaneous filing. User may not have seen previous data.)    Transfers Overall transfer level: Needs assistance (Simultaneous filing. User may not have seen previous data.) Equipment used: Rolling walker (2 wheeled) (Simultaneous filing. User may not have seen previous data.) Transfers: Sit to/from Stand (Simultaneous filing. User may not have seen previous data.) Sit to Stand: Min guard (Simultaneous filing. User may not have seen previous data.)         General transfer comment: Min guard for steadying x2 trials. Cues for hand placement. (Simultaneous filing. User may not have seen previous data.)    Balance Overall balance assessment: Needs assistance (Simultaneous filing. User may not have seen previous data.) Sitting-balance support: No upper extremity supported;Feet supported (Simultaneous filing. User may not have seen previous data.) Sitting balance-Leahy Scale: Good (Simultaneous filing. User may not have seen previous data.) Sitting balance - Comments: Patient able to maintain dynamic sitting balance at EOB without LOB.   Standing balance support: Bilateral upper extremity supported;During functional activity (Simultaneous filing. User may not have seen previous data.) Standing balance-Leahy Scale: Fair (Simultaneous filing. User may not have seen previous data.) Standing balance comment: Patient able to maintain standing balance with unilateral UE support on RW and no LOB during toileting hygiene. (Simultaneous filing. User may not have seen previous data.)                           ADL either performed or assessed with clinical judgement   ADL Overall ADL's : Needs assistance/impaired     Grooming: Set up;Sitting       Lower Body Bathing: Minimal assistance;Sit  to/from stand;Sitting/lateral leans Lower Body Bathing Details (indicate cue type and reason):  Patient able to wash front perineal area in standing with Min guard for steadying. Upper Body Dressing : Set up;Sitting Upper Body Dressing Details (indicate cue type and reason): Donned anterior hospital gown. Lower Body Dressing: Minimal assistance;Sit to/from stand Lower Body Dressing Details (indicate cue type and reason): Min A to don footwear seated EOB. Toilet Transfer: Immunologist Details (indicate cue type and reason): Simulated with transfer from recliner to EOB. Toileting- Architect and Hygiene: Min guard;Sit to/from stand Toileting - Clothing Manipulation Details (indicate cue type and reason): Min guard for steadying during hygiene/clothing management.     Functional mobility during ADLs: Min guard;Rolling walker General ADL Comments: Min guard for short distance mobility in hosptial room with use of RW. DOE 3/4 with light activity.     Vision Baseline Vision/History: Wears glasses Wears Glasses: Reading only Patient Visual Report: No change from baseline Vision Assessment?: No apparent visual deficits     Perception     Praxis      Pertinent Vitals/Pain Pain Assessment: No/denies pain     Hand Dominance Right   Extremity/Trunk Assessment Upper Extremity Assessment Upper Extremity Assessment: Generalized weakness   Lower Extremity Assessment Lower Extremity Assessment: Defer to PT evaluation   Cervical / Trunk Assessment Cervical / Trunk Assessment: Normal   Communication Communication Communication: HOH   Cognition Arousal/Alertness: Awake/alert Behavior During Therapy: WFL for tasks assessed/performed Overall Cognitive Status: Within Functional Limits for tasks assessed                                     General Comments  in supine HR 68 bpm, SaO2 on 3L 100%O2, BP 142/114, with mobility HR increased to high 80s, SaO2 dropped to 92%O2, and BP increased to 160/78    Exercises     Shoulder Instructions       Home Living Family/patient expects to be discharged to:: Private residence Living Arrangements: Children Available Help at Discharge: Family;Other (Comment) (Sitter if family not available) Type of Home: House Home Access: Level entry     Home Layout: Able to live on main level with bedroom/bathroom     Bathroom Shower/Tub: Chief Strategy Officer: Standard     Home Equipment: Environmental consultant - 2 wheels;Cane - single point;Bedside commode;Other (comment);Hand held shower head (shower stool)          Prior Functioning/Environment Level of Independence: Needs assistance  Gait / Transfers Assistance Needed: Mod I with use of RW for home mobility.Wc in community dwellings. ADL's / Homemaking Assistance Needed: Assist from family or care aid for bathing/dressing. Patient reports requiring assist for LB bathing but can don LB clothing with set-up assist.            OT Problem List: Decreased strength;Decreased activity tolerance;Impaired balance (sitting and/or standing);Decreased coordination;Cardiopulmonary status limiting activity;Increased edema      OT Treatment/Interventions: Self-care/ADL training;Therapeutic exercise;Energy conservation;DME and/or AE instruction;Therapeutic activities;Patient/family education;Balance training    OT Goals(Current goals can be found in the care plan section) Acute Rehab OT Goals Patient Stated Goal: To get better. (Simultaneous filing. User may not have seen previous data.) OT Goal Formulation: With patient Time For Goal Achievement: 07/13/20 Potential to Achieve Goals: Good  OT Frequency: Min 2X/week   Barriers to D/C:            Co-evaluation  AM-PAC OT "6 Clicks" Daily Activity     Outcome Measure Help from another person eating meals?: None Help from another person taking care of personal grooming?: A Little Help from another person toileting, which includes using toliet, bedpan, or urinal?: A  Little Help from another person bathing (including washing, rinsing, drying)?: A Little Help from another person to put on and taking off regular upper body clothing?: A Little Help from another person to put on and taking off regular lower body clothing?: A Little 6 Click Score: 19   End of Session Equipment Utilized During Treatment: Gait belt;Rolling walker;Oxygen  Activity Tolerance: Patient limited by fatigue Patient left: in bed;with call bell/phone within reach;with chair alarm set;Other (comment) (Transport to take patient for procedure.)  OT Visit Diagnosis: Unsteadiness on feet (R26.81);Muscle weakness (generalized) (M62.81)                Time: 0100-7121 OT Time Calculation (min): 23 min Charges:  OT General Charges $OT Visit: 1 Visit OT Evaluation $OT Eval Moderate Complexity: 1 Mod OT Treatments $Self Care/Home Management : 8-22 mins  Christe Tellez H. OTR/L Supplemental OT, Department of rehab services [phone]  Tyreece Gelles R H. 06/29/2020, 10:03 AM

## 2020-06-30 LAB — PROCALCITONIN: Procalcitonin: 0.11 ng/mL

## 2020-06-30 LAB — COMPREHENSIVE METABOLIC PANEL
ALT: 38 U/L (ref 0–44)
AST: 70 U/L — ABNORMAL HIGH (ref 15–41)
Albumin: 1.8 g/dL — ABNORMAL LOW (ref 3.5–5.0)
Alkaline Phosphatase: 66 U/L (ref 38–126)
Anion gap: 7 (ref 5–15)
BUN: 41 mg/dL — ABNORMAL HIGH (ref 8–23)
CO2: 24 mmol/L (ref 22–32)
Calcium: 7.9 mg/dL — ABNORMAL LOW (ref 8.9–10.3)
Chloride: 104 mmol/L (ref 98–111)
Creatinine, Ser: 1.72 mg/dL — ABNORMAL HIGH (ref 0.44–1.00)
GFR, Estimated: 28 mL/min — ABNORMAL LOW (ref >60)
Glucose, Bld: 250 mg/dL — ABNORMAL HIGH (ref 70–99)
Potassium: 3.7 mmol/L (ref 3.5–5.1)
Sodium: 135 mmol/L (ref 135–145)
Total Bilirubin: 0.3 mg/dL (ref 0.3–1.2)
Total Protein: 5.7 g/dL — ABNORMAL LOW (ref 6.5–8.1)

## 2020-06-30 LAB — CBC WITH DIFFERENTIAL/PLATELET
Abs Immature Granulocytes: 0.03 10*3/uL (ref 0.00–0.07)
Basophils Absolute: 0 10*3/uL (ref 0.0–0.1)
Basophils Relative: 0 %
Eosinophils Absolute: 0 10*3/uL (ref 0.0–0.5)
Eosinophils Relative: 0 %
HCT: 28.6 % — ABNORMAL LOW (ref 36.0–46.0)
Hemoglobin: 9.8 g/dL — ABNORMAL LOW (ref 12.0–15.0)
Immature Granulocytes: 0 %
Lymphocytes Relative: 17 %
Lymphs Abs: 1.2 10*3/uL (ref 0.7–4.0)
MCH: 33.7 pg (ref 26.0–34.0)
MCHC: 34.3 g/dL (ref 30.0–36.0)
MCV: 98.3 fL (ref 80.0–100.0)
Monocytes Absolute: 0.5 10*3/uL (ref 0.1–1.0)
Monocytes Relative: 7 %
Neutro Abs: 5 10*3/uL (ref 1.7–7.7)
Neutrophils Relative %: 76 %
Platelets: 92 10*3/uL — ABNORMAL LOW (ref 150–400)
RBC: 2.91 MIL/uL — ABNORMAL LOW (ref 3.87–5.11)
RDW: 15.9 % — ABNORMAL HIGH (ref 11.5–15.5)
WBC: 6.7 10*3/uL (ref 4.0–10.5)
nRBC: 0 % (ref 0.0–0.2)

## 2020-06-30 LAB — GLUCOSE, CAPILLARY
Glucose-Capillary: 212 mg/dL — ABNORMAL HIGH (ref 70–99)
Glucose-Capillary: 88 mg/dL (ref 70–99)
Glucose-Capillary: 192 mg/dL — ABNORMAL HIGH (ref 70–99)
Glucose-Capillary: 329 mg/dL — ABNORMAL HIGH (ref 70–99)
Glucose-Capillary: 200 mg/dL — ABNORMAL HIGH (ref 70–99)

## 2020-06-30 MED ORDER — NADOLOL 20 MG PO TABS
10.0000 mg | ORAL_TABLET | Freq: Every day | ORAL | Status: DC
  Administered 2020-06-30 – 2020-07-01 (×2): 10 mg via ORAL
  Filled 2020-06-30 (×2): qty 1

## 2020-06-30 MED ORDER — GUAIFENESIN ER 600 MG PO TB12
600.0000 mg | ORAL_TABLET | Freq: Two times a day (BID) | ORAL | Status: DC
  Administered 2020-06-30 – 2020-07-01 (×3): 600 mg via ORAL
  Filled 2020-06-30 (×3): qty 1

## 2020-06-30 NOTE — Progress Notes (Signed)
PROGRESS NOTE  FABIAN ALMENDINGER  DOB: 1928-08-31  PCP: [name], MD [id number]  DOA: 06/27/2020  LOS: 3 days   Chief complaint: Shortness of breath  Brief narrative: Stacey Harris is a 86 y.o. female with PMH significant for DM2, HLD, HTN, CKD three, baseline creatinine 1.5, myelodysplasia, chronic thrombocytopenia, hypothyroidism, osteoarthritis.  She was recently admitted 9/21-9/24 with acute ischemic stroke with left hemiparesis and went to CIR subsequently Patient presented to the ED on 12/20 with complaint of shortness of breath..  Also reported weight gain, worsening pedal edema, progressive decline in physical strength. She was noted to be hypoxic to 80s, lactic acid level elevated to four. She was started on IV Levaquin, IV Solu-Medrol and given boluses of IV fluid.  CT scan done on admission showed prominent interstitial markings?  ILD?  Pulmonary edema.  Also found to have portal hypertension splenomegaly and esophageal varices with large hiatal hernia  Subjective: Patient was seen and examined this afternoon.  Elderly Caucasian female.  Not in distress.  Daughter at bedside.  On 2 L oxygen nasal cannula.   Assessment/Plan: Acute respiratory failure with hypoxia Acute exacerbation of diastolic congestive heart failure -Diuresed with IV Lasix 40 mg daily.  -12/21, echo with EF 60 to [id number], grade 2 diastolic dysfunction, -Net IO Since Admission: -755 mL [06/30/20 1421].  -Creatinine worsened today so I held a.m. dose of Lasix today. -Continue to monitor for daily intake output, weight, blood pressure, BNP, renal function and electrolytes. Recent Labs  Lab 06/27/20 1211 06/27/20 2012 06/27/20 2018 06/28/20 0245 06/29/20 0112 06/30/20 0224  BNP 197.0*  --   --   --   --   --   BUN 20  --   --  20 35* 41*  CREATININE 1.48*  --   --  1.36* 1.41* 1.72*  K 3.8  --  3.6 3.6 4.0 3.7  MG  --  1.4*  --  2.1  --   --    COPD versus interstitial lung  disease -Patient never smoked but had exposure to her husband's smoking 40 years ago.  She does not have an established diagnosis of COPD.  However on review of her chest x-rays since 2010, he has chronic interstitial markings.  Did not require supplemental oxygen at home but lately she gets winded even with minimal exertion. -Imagings on admission was negative for any acute infiltrate.   -Patient complains of productive cough.  Will start on Mucinex twice daily. -She needs outpatient evaluation by pulmonology. Recent Labs  Lab 06/27/20 1211 06/27/20 1345 06/27/20 2012 06/27/20 2340 06/28/20 0245 06/28/20 1439 06/29/20 0112 06/30/20 0224  WBC 5.6  --   --   --  4.7  --  8.1 6.7  LATICACIDVEN 4.0* 2.8*  --  1.7  --  2.5*  --   --   PROCALCITON  --   --  0.10  --  <0.10 0.13 0.12 0.11   Liver cirrhosis -With portal hypertension, splenomegaly and esophageal varices. -Liver enzymes stable.  Hepatitis panel negative. -Liver ultrasound suggests liver cirrhosis.[name] -GI consult appreciated.  Recommended nadolol 10 mg daily for prophylaxis of esophageal varices.  To hold if heart rate less than 60 bpm. -Patient to follow-up in GI clinic in 6 to 8 weeks.  Myelodysplasia with chronic thrombocytopenia and iron deficiency anemia Recent Labs    06/27/20 1211 06/27/20 2012 06/27/20 2018 06/28/20 0245 06/29/20 0112 06/30/20 0224  HGB 10.9*  --  9.5* 10.4* 9.8* 9.8*  MCV 104.1*  --   --  103.2* 99.7 98.3  VITAMINB12  --   --   --  1,152*  --   --   FOLATE  --   --   --  33.7  --   --   FERRITIN  --  152  --   --   --   --   TIBC  --   --   --  185*  --   --   IRON  --   --   --  21*  --   --   RETICCTPCT  --   --   --  2.4  --   --    Recent Labs  Lab 06/27/20 1211 06/28/20 0245 06/29/20 0112 06/30/20 0224  PLT 81* 79* 85* 92*   Mobility: Home health PT recommended by physical therapy Code Status:   Code Status: DNR  Nutritional status: Body mass index is 27.46 kg/m.     Diet  Order            Diet Carb Modified Fluid consistency: Thin; Room service appropriate? Yes  Diet effective now                 DVT prophylaxis: Place and maintain sequential compression device Start: 06/27/20 2338   Antimicrobials:  None Fluid: None Consultants: GI Family Communication:  Daughter at bedside  Status is: Inpatient  Remains inpatient appropriate because: Ongoing diuresis   Dispo: The patient is from: Home              Anticipated d/c is to: Home              Anticipated d/c date is: Hopefully tomorrow              Patient currently is not medically stable to d/c.       Infusions:  . sodium chloride      Scheduled Meds: . ferrous sulfate  325 mg Oral Q breakfast  . guaiFENesin  600 mg Oral BID  . insulin aspart  0-9 Units Subcutaneous Q4H  . levothyroxine  50 mcg Oral Q0600  . pantoprazole  40 mg Oral Daily  . predniSONE  20 mg Oral QAC breakfast  . rosuvastatin  10 mg Oral Daily  . sodium chloride flush  3 mL Intravenous Q12H    Antimicrobials: Anti-infectives (From admission, onward)   Start     Dose/Rate Route Frequency Ordered Stop   06/28/20 1600  doxycycline (VIBRAMYCIN) 100 mg in sodium chloride 0.9 % 250 mL IVPB  Status:  Discontinued        100 mg 125 mL/hr over 120 Minutes Intravenous Every 12 hours 06/28/20 1514 06/29/20 1430   06/28/20 1000  doxycycline (VIBRAMYCIN) 100 mg in sodium chloride 0.9 % 250 mL IVPB  Status:  Discontinued        100 mg 125 mL/hr over 120 Minutes Intravenous Every 12 hours 06/27/20 2324 06/28/20 1514   06/27/20 1315  levofloxacin (LEVAQUIN) IVPB 750 mg        750 mg 100 mL/hr over 90 Minutes Intravenous  Once 06/27/20 1311 06/27/20 1736      PRN meds: sodium chloride, acetaminophen **OR** acetaminophen, albuterol, dextromethorphan, HYDROcodone-acetaminophen, sodium chloride flush   Objective: Vitals:   06/30/20 0742 06/30/20 1159  BP: (!) 152/77 140/62  Pulse: 60   Resp: 20 20  Temp: 97.6 F (36.4  C)   SpO2: 100%     Intake/Output Summary (Last 24  hours) at 06/30/2020 1421 Last data filed at 06/30/2020 0300 Gross per 24 hour  Intake --  Output 1050 ml  Net -1050 ml   Filed Weights   06/30/20 0925  Weight: 68.1 kg   Weight change:  Body mass index is 27.46 kg/m.   Physical Exam: General exam: Pleasant, elderly Caucasian female.  Not in distress Skin: No rashes, lesions or ulcers. HEENT: Atraumatic, normocephalic, no obvious bleeding Lungs: Mild scattered rales bilaterally in bases CVS: Regular rate and rhythm, no murmur GI/Abd soft, nontender, nondistended, bowel sound present CNS: Alert, awake, oriented to place and person Psychiatry: Mood appropriate Extremities: Improving bilateral pedal edema, no calf tenderness  Data Review: I have personally reviewed the laboratory data and studies available.  Recent Labs  Lab 06/27/20 1211 06/27/20 2018 06/28/20 0245 06/29/20 0112 06/30/20 0224  WBC 5.6  --  4.7 8.1 6.7  NEUTROABS 4.0  --  3.8 6.6 5.0  HGB 10.9* 9.5* 10.4* 9.8* 9.8*  HCT 35.2* 28.0* 32.3* 30.0* 28.6*  MCV 104.1*  --  103.2* 99.7 98.3  PLT 81*  --  79* 85* 92*   Recent Labs  Lab 06/27/20 1211 06/27/20 2012 06/27/20 2018 06/28/20 0245 06/29/20 0112 06/30/20 0224  NA 140  --  144 138 135 135  K 3.8  --  3.6 3.6 4.0 3.7  CL 107  --   --  107 104 104  CO2 19*  --   --  21* 22 24  GLUCOSE 375*  --   --  296* 164* 250*  BUN 20  --   --  20 35* 41*  CREATININE 1.48*  --   --  1.36* 1.41* 1.72*  CALCIUM 8.5*  --   --  8.2* 7.8* 7.9*  MG  --  1.4*  --  2.1  --   --   PHOS  --   --   --  3.4  --   --     F/u labs ordered  Signed, Terrilee Croak, MD [facility] 06/30/2020

## 2020-06-30 NOTE — TOC Progression Note (Signed)
Transition of Care [facility]) - Progression Note    Patient Details  Name: Stacey Harris MRN: [id number] Date of Birth: January 06, 1929  Transition of Care [facility]) CM/SW [place], [place] Phone Number: 06/30/2020, 1:09 PM  Clinical Narrative:     Pt daughter informed of need for home o2 referral. Home 02 arranged with Jermaine from [place].   Expected Discharge Plan: [place] Barriers to Discharge: Continued Medical Work up  Expected Discharge Plan and Services Expected Discharge Plan: [place] arrangements for the past 2 months: Single Family Home                                       Social Determinants of Health (SDOH) Interventions    Readmission Risk Interventions No flowsheet data found.

## 2020-06-30 NOTE — Progress Notes (Signed)
SATURATION QUALIFICATIONS: (This note is used to comply with regulatory documentation for home oxygen)  Patient Saturations on Room Air at Rest = 92%  Patient Saturations on Room Air while Ambulating = 80%  Patient Saturations on 3 Liters of oxygen while Ambulating = 92%  Please briefly explain why patient needs home oxygen:

## 2020-06-30 NOTE — Progress Notes (Signed)
Inpatient Diabetes Program Recommendations  AACE/ADA: New Consensus Statement on Inpatient Glycemic Control (2015)  Target Ranges:  Prepandial:   less than 140 mg/dL      Peak postprandial:   less than 180 mg/dL (1-2 hours)      Critically ill patients:  140 - 180 mg/dL   Lab Results  Component Value Date   GLUCAP 88 06/30/2020   HGBA1C 7.9 (H) 06/27/2020    Review of Glycemic Control Results for Stacey Harris, Stacey Harris (MRN [id number]) as of 06/30/2020 11:07  Ref. Range 06/29/2020 07:19 06/29/2020 11:40 06/29/2020 16:02 06/29/2020 19:32 06/29/2020 23:20 06/30/2020 03:18 06/30/2020 07:40  Glucose-Capillary Latest Ref Range: 70 - 99 mg/dL 168 (H) 165 (H) 307 (H) 413 (H) 386 (H) 212 (H) 88   Diabetes history:  DM2 Outpatient Diabetes medications:  Amaryly 4 mg QAM Current orders for Inpatient glycemic control:  Noovlog 0-9 units Q4H Prednisone 20 mg daily  Inpatient Diabetes Program Recommendations:     Patient has carb modified diet order.  If eating please consider Novolog 0-15 units TID with meals.  Will continue to follow while inpatient.  Thank you, Reche Dixon, RN, BSN Diabetes Coordinator Inpatient Diabetes Program [phone] (team pager from 8a-5p)

## 2020-06-30 NOTE — Plan of Care (Signed)

## 2020-07-01 LAB — CBC WITH DIFFERENTIAL/PLATELET
Abs Immature Granulocytes: 0.03 10*3/uL (ref 0.00–0.07)
Basophils Absolute: 0 10*3/uL (ref 0.0–0.1)
Basophils Relative: 0 %
Eosinophils Absolute: 0.3 10*3/uL (ref 0.0–0.5)
Eosinophils Relative: 4 %
HCT: 32.3 % — ABNORMAL LOW (ref 36.0–46.0)
Hemoglobin: 11.1 g/dL — ABNORMAL LOW (ref 12.0–15.0)
Immature Granulocytes: 0 %
Lymphocytes Relative: 32 %
Lymphs Abs: 2.1 10*3/uL (ref 0.7–4.0)
MCH: 33.7 pg (ref 26.0–34.0)
MCHC: 34.4 g/dL (ref 30.0–36.0)
MCV: 98.2 fL (ref 80.0–100.0)
Monocytes Absolute: 0.7 10*3/uL (ref 0.1–1.0)
Monocytes Relative: 10 %
Neutro Abs: 3.7 10*3/uL (ref 1.7–7.7)
Neutrophils Relative %: 54 %
Platelets: 96 10*3/uL — ABNORMAL LOW (ref 150–400)
RBC: 3.29 MIL/uL — ABNORMAL LOW (ref 3.87–5.11)
RDW: 15.5 % (ref 11.5–15.5)
WBC: 6.7 10*3/uL (ref 4.0–10.5)
nRBC: 0 % (ref 0.0–0.2)

## 2020-07-01 LAB — COMPREHENSIVE METABOLIC PANEL
ALT: 37 U/L (ref 0–44)
AST: 72 U/L — ABNORMAL HIGH (ref 15–41)
Albumin: 1.8 g/dL — ABNORMAL LOW (ref 3.5–5.0)
Alkaline Phosphatase: 70 U/L (ref 38–126)
Anion gap: 9 (ref 5–15)
BUN: 36 mg/dL — ABNORMAL HIGH (ref 8–23)
CO2: 23 mmol/L (ref 22–32)
Calcium: 8.3 mg/dL — ABNORMAL LOW (ref 8.9–10.3)
Chloride: 102 mmol/L (ref 98–111)
Creatinine, Ser: 1.48 mg/dL — ABNORMAL HIGH (ref 0.44–1.00)
GFR, Estimated: 34 mL/min — ABNORMAL LOW (ref >60)
Glucose, Bld: 154 mg/dL — ABNORMAL HIGH (ref 70–99)
Potassium: 4.6 mmol/L (ref 3.5–5.1)
Sodium: 134 mmol/L — ABNORMAL LOW (ref 135–145)
Total Bilirubin: 0.9 mg/dL (ref 0.3–1.2)
Total Protein: 5.9 g/dL — ABNORMAL LOW (ref 6.5–8.1)

## 2020-07-01 LAB — GLUCOSE, CAPILLARY
Glucose-Capillary: 319 mg/dL — ABNORMAL HIGH (ref 70–99)
Glucose-Capillary: 151 mg/dL — ABNORMAL HIGH (ref 70–99)
Glucose-Capillary: 136 mg/dL — ABNORMAL HIGH (ref 70–99)
Glucose-Capillary: 211 mg/dL — ABNORMAL HIGH (ref 70–99)

## 2020-07-01 MED ORDER — NADOLOL 20 MG PO TABS: 10.0000 mg | ORAL_TABLET | Freq: Every day | ORAL | 2 refills | Status: AC

## 2020-07-01 MED ORDER — ORAL CARE MOUTH RINSE
15.0000 mL | Freq: Two times a day (BID) | OROMUCOSAL | Status: DC
  Administered 2020-07-01: 15 mL via OROMUCOSAL

## 2020-07-01 MED ORDER — PREDNISONE 20 MG PO TABS: 20.0000 mg | ORAL_TABLET | Freq: Every day | ORAL | 0 refills | Status: AC

## 2020-07-01 MED ORDER — GUAIFENESIN ER 600 MG PO TB12: 600.0000 mg | ORAL_TABLET | Freq: Two times a day (BID) | ORAL | 2 refills | Status: AC

## 2020-07-01 NOTE — Progress Notes (Signed)
Physical Therapy Treatment Patient Details Name: Stacey Harris MRN: [id number] DOB: [birth year] Today's Date: 07/01/2020    History of Present Illness 86 y.o. female presenting with progressive cough, generalized fatigue, SOB x1 week and hypoxia. CXR (+) possible interstitial lung disease. Patient also with abnormal CT with ILD versus decompensated cirrhosis/heart failure, elevated troponin, and lactic acidosis. Patient admitted with acute on chronic respiratory failure. PMHx significant for DM, recurrent UTIs, HLD, HTN, thyroid disease, Hx of CVA, CHF, CKD, and MDS.    PT Comments    Pt pleasant and willing to progress mobility. Pt with good saturation at 90% on RA initially with drop to 87% with mobility and 2L applied. Pt with desaturation with gait on 3L to 83% requiring seated rest and 4L to recover to >93% in  1 min. At rest pt maintaning >90% on 1L and educated for need for 4L with mobility at present. Pt educated for HEP and encouraged to continue to perform as well as increased mobility daily with nursing staff.     Follow Up Recommendations  Home health PT;Supervision/Assistance - 24 hour     Equipment Recommendations  None recommended by PT    Recommendations for Other Services       Precautions / Restrictions Precautions Precautions: Fall Precaution Comments: watch sats    Mobility  Bed Mobility Overal bed mobility: Needs Assistance Bed Mobility: Supine to Sit     Supine to sit: Min guard     General bed mobility comments: use of rail and HOB 15 degrees to get to EOB with increased time  Transfers Overall transfer level: Needs assistance   Transfers: Sit to/from Stand Sit to Stand: Min guard         General transfer comment: cues for hand placement as pt pulling up on RW to stand despite cues  Ambulation/Gait Ambulation/Gait assistance: Min guard Gait Distance (Feet): 72 Feet Assistive device: Rolling walker (2 wheeled) Gait Pattern/deviations:  Step-through pattern;Decreased stride length;Trunk flexed   Gait velocity interpretation: 1.31 - 2.62 ft/sec, indicative of limited community ambulator General Gait Details: cues for posture, direction and breathing technique. Pt with drop to 83% on 3L with gait with 1 min seated rest and 4L to recover to 95% then able to return to 1.5 L at 97%   Stairs             Wheelchair Mobility    Modified Rankin (Stroke Patients Only)       Balance Overall balance assessment: Needs assistance   Sitting balance-Leahy Scale: Fair Sitting balance - Comments: pt able to sit EOb without assist   Standing balance support: Bilateral upper extremity supported;During functional activity Standing balance-Leahy Scale: Poor Standing balance comment: UE support on RW in standing                            Cognition Arousal/Alertness: Awake/alert Behavior During Therapy: WFL for tasks assessed/performed Overall Cognitive Status: Within Functional Limits for tasks assessed                                        Exercises General Exercises - Lower Extremity Long Arc Quad: AROM;Both;Seated;15 reps Hip Flexion/Marching: AROM;Both;Seated;15 reps    General Comments        Pertinent Vitals/Pain Pain Assessment: No/denies pain    Home Living  Prior Function            PT Goals (current goals can now be found in the care plan section) Progress towards PT goals: Progressing toward goals    Frequency    Min 3X/week      PT Plan Current plan remains appropriate    Co-evaluation              AM-PAC PT "6 Clicks" Mobility   Outcome Measure  Help needed turning from your back to your side while in a flat bed without using bedrails?: None Help needed moving from lying on your back to sitting on the side of a flat bed without using bedrails?: A Little Help needed moving to and from a bed to a chair (including a  wheelchair)?: A Little Help needed standing up from a chair using your arms (e.g., wheelchair or bedside chair)?: A Little Help needed to walk in hospital room?: A Little Help needed climbing 3-5 steps with a railing? : A Lot 6 Click Score: 18    End of Session Equipment Utilized During Treatment: Gait belt;Oxygen Activity Tolerance: Patient tolerated treatment well Patient left: in chair;with call bell/phone within reach;with chair alarm set Nurse Communication: Mobility status PT Visit Diagnosis: Unsteadiness on feet (R26.81);Other abnormalities of gait and mobility (R26.89);Muscle weakness (generalized) (M62.81);Difficulty in walking, not elsewhere classified (R26.2)     Time: [id number] PT Time Calculation (min) (ACUTE ONLY): 19 min  Charges:  $Gait Training: 8-22 mins                     Bayard Males, PT Acute Rehabilitation Services Pager: [phone] Office: [place] 07/01/2020, 10:41 AM

## 2020-07-01 NOTE — Progress Notes (Signed)
Occupational Therapy Treatment Patient Details Name: Stacey Harris MRN: [id number] DOB: [birth year] Today's Date: 07/01/2020    History of present illness 86 y.o. female presenting with progressive cough, generalized fatigue, SOB x1 week and hypoxia. CXR (+) possible interstitial lung disease. Patient also with abnormal CT with ILD versus decompensated cirrhosis/heart failure, elevated troponin, and lactic acidosis. Patient admitted with acute on chronic respiratory failure. PMHx significant for DM, recurrent UTIs, HLD, HTN, thyroid disease, Hx of CVA, CHF, CKD, and MDS.   OT comments  OT treatment session with focus on self-care re-education, ADL transfers, energy conservation and safety with RW. Daughter present at bedside for part of treatment session. Patient continues to require supplemental O2 via [place] with 2L and rest and 3L with activity. Patient would benefit from continued acute OT services in prep for safe d/c home. Continued recommendation for HHOT to maximize safety/independence and decrease risk of falls.    Follow Up Recommendations  Home health OT;Supervision/Assistance - 24 hour    Equipment Recommendations  None recommended by OT    Recommendations for Other Services      Precautions / Restrictions Precautions Precautions: Fall Precaution Comments: watch sats Restrictions Weight Bearing Restrictions: No       Mobility Bed Mobility Overal bed mobility: Needs Assistance Bed Mobility: Supine to Sit     Supine to sit: Min guard     General bed mobility comments: Patient seated in recliner upon entry.  Transfers Overall transfer level: Needs assistance Equipment used: Rolling walker (2 wheeled) Transfers: Sit to/from Stand Sit to Stand: Min guard         General transfer comment: cues for hand placement as pt pulling up on RW to stand despite cues    Balance Overall balance assessment: Needs assistance Sitting-balance support: No upper extremity  supported;Feet supported Sitting balance-Leahy Scale: Good Sitting balance - Comments: pt able to sit EOb without assist   Standing balance support: Bilateral upper extremity supported;During functional activity Standing balance-Leahy Scale: Fair Standing balance comment: Patient able to maintain static standing balance duirng oral hygiene standing at sink without UE support.                           ADL either performed or assessed with clinical judgement   ADL       Grooming: Supervision/safety;Standing               Lower Body Dressing: Minimal assistance;Sit to/from stand Lower Body Dressing Details (indicate cue type and reason): Min A to don LB clothing. Cues for hand placement and pacing.             Functional mobility during ADLs: Min guard;Rolling walker General ADL Comments: Min guard for short distance mobility in hosptial room with use of RW. DOE 24 with light activity.     Vision       Perception     Praxis      Cognition Arousal/Alertness: Awake/alert Behavior During Therapy: WFL for tasks assessed/performed Overall Cognitive Status: Within Functional Limits for tasks assessed                                          Exercises General Exercises - Lower Extremity Long Arc Quad: AROM;Both;Seated;15 reps Hip Flexion/Marching: AROM;Both;Seated;15 reps   Shoulder Instructions       General Comments SpO2 97% at  rest on 2L, HR 57bpm. With activity SpO2 85% improving to 95% on 3L, HR 66bpm.    Pertinent Vitals/ Pain       Pain Assessment: No/denies pain  Home Living                                          Prior Functioning/Environment              Frequency  Min 2X/week        Progress Toward Goals  OT Goals(current goals can now be found in the care plan section)  Progress towards OT goals: Progressing toward goals  Acute Rehab OT Goals Patient Stated Goal: To get better. OT  Goal Formulation: With patient Time For Goal Achievement: 07/13/20 Potential to Achieve Goals: Good ADL Goals Pt Will Perform Grooming: standing;with supervision Pt Will Perform Upper Body Dressing: with supervision;sitting Pt Will Perform Lower Body Dressing: with min assist;sit to/from stand Pt Will Transfer to Toilet: with supervision;ambulating;bedside commode Pt Will Perform Toileting - Clothing Manipulation and hygiene: with supervision;sit to/from stand Additional ADL Goal #1: Patient will maintain SpO2 >90% on 2L via O2 during short distance mobility in room in prep for ADLs.  Plan Discharge plan remains appropriate    Co-evaluation                 AM-PAC OT "6 Clicks" Daily Activity     Outcome Measure   Help from another person eating meals?: None Help from another person taking care of personal grooming?: A Little Help from another person toileting, which includes using toliet, bedpan, or urinal?: A Little Help from another person bathing (including washing, rinsing, drying)?: A Little Help from another person to put on and taking off regular upper body clothing?: A Little Help from another person to put on and taking off regular lower body clothing?: A Little 6 Click Score: 19    End of Session Equipment Utilized During Treatment: Gait belt;Rolling walker;Oxygen  OT Visit Diagnosis: Unsteadiness on feet (R26.81);Muscle weakness (generalized) (M62.81)   Activity Tolerance Patient tolerated treatment well   Patient Left in chair;with call bell/phone within reach;with chair alarm set;with family/visitor present   Nurse Communication          Time: 7517-0017 OT Time Calculation (min): 26 min  Charges: OT General Charges $OT Visit: 1 Visit OT Treatments $Self Care/Home Management : 23-37 mins  Evadne Ose H. OTR/L Supplemental OT, Department of rehab services [phone]   Dana Dorner R H. 07/01/2020, 1:13 PM

## 2020-07-01 NOTE — Discharge Instructions (Signed)
Cirrhosis  Cirrhosis is long-term (chronic) liver injury. The liver is the body's largest internal organ, and it performs many functions. It converts food into energy, removes toxic material from the blood, makes important proteins, and absorbs necessary vitamins from food. In cirrhosis, healthy liver cells are replaced by scar tissue. This prevents blood from flowing through the liver, making it difficult for the liver to function. Scarring of the liver cannot be reversed, but treatment can prevent it from getting worse. What are the causes? Common causes of this condition are hepatitis C and long-term alcohol abuse. Other causes include:  Nonalcoholic fatty liver disease. This happens when fat is deposited in the liver by causes other than alcohol.  Hepatitis B infection.  Autoimmune hepatitis. In this condition, the body's defense system (immune system) mistakenly attacks the liver cells, causing irritation and swelling (inflammation).  Diseases that cause blockage of ducts inside the liver.  Inherited liver diseases, such as hemochromatosis. This is one of the most common inherited liver diseases. In this disease, deposits of iron collect in the liver and other organs.  Reactions to certain long-term medicines, such as amiodarone, a heart medicine.  Parasitic infections. These include schistosomiasis, which is caused by a flatworm.  Long-term contact to certain toxins. These toxins include certain organic solvents, such as toluene and chloroform. What increases the risk? You are more likely to develop this condition if:  You have certain types of viral hepatitis.  You abuse alcohol, especially if you are female.  You are overweight.  You share needles.  You have unprotected sex with someone who has viral hepatitis. What are the signs or symptoms? You may not have any signs and symptoms at first. Symptoms may not develop until the damage to your liver starts to get worse. Early  symptoms may include:  Weakness and tiredness (fatigue).  Changes in sleep patterns or having trouble sleeping.  Itchiness.  Tenderness in the right-upper part of your abdomen.  Weight loss and muscle loss.  Nausea.  Loss of appetite.  Appearance of tiny blood vessels under the skin. Later symptoms may include:  Fatigue or weakness that is getting worse.  Yellow skin and eyes (jaundice).  Buildup of fluid in the abdomen (ascites). You may notice that your clothes are tight around your waist.  Weight gain.  Swelling of the feet and ankles (edema).  Trouble breathing.  Easy bruising and bleeding.  Vomiting blood.  Black or bloody stool.  Mental confusion. How is this diagnosed? Your health care provider may suspect cirrhosis based on your symptoms and medical history, especially if you have other medical conditions or a history of alcohol abuse. Your health care provider will do a physical exam to feel your liver and to check for signs of cirrhosis. He or she may perform other tests, including:  Blood tests to check: ? For hepatitis B or C. ? Kidney function. ? Liver function.  Imaging tests such as: ? MRI or CT scan to look for changes seen in advanced cirrhosis. ? Ultrasound to see if normal liver tissue is being replaced by scar tissue.  A procedure in which a long needle is used to take a sample of liver tissue to be checked in a lab (biopsy). Liver biopsy can confirm the diagnosis of cirrhosis. How is this treated? Treatment for this condition depends on how damaged your liver is and what caused the damage. It may include treating the symptoms of cirrhosis, or treating the underlying causes in order to   slow the damage. Treatment may include:  Making lifestyle changes, such as: ? Eating a healthy diet. You may need to work with your health care provider or a diet and nutrition specialist (dietitian) to develop an eating plan. ? Restricting salt  intake. ? Maintaining a healthy weight. ? Not abusing drugs or alcohol.  Taking medicines to: ? Treat liver infections or other infections. ? Control itching. ? Reduce fluid buildup. ? Reduce certain blood toxins. ? Reduce risk of bleeding from enlarged blood vessels in the stomach or esophagus (varices).  Liver transplant. In this procedure, a liver from a donor is used to replace your diseased liver. This is done if cirrhosis has caused liver failure. Other treatments and procedures may be done depending on the problems that you get from cirrhosis. Common problems include liver-related kidney failure (hepatorenal syndrome). Follow these instructions at home:   Take medicines only as told by your health care provider. Do not use medicines that are toxic to your liver. Ask your health care provider before taking any new medicines, including over-the-counter medicines.  Rest as needed.  Eat a well-balanced diet. Ask your health care provider or dietitian for more information.  Limit your salt or water intake, if your health care provider asks you to do this.  Do not drink alcohol. This is especially important if you are taking acetaminophen.  Keep all follow-up visits as told by your health care provider. This is important. Contact a health care provider if you:  Have fatigue or weakness that is getting worse.  Develop swelling of the hands, feet, legs, or face.  Have a fever.  Develop loss of appetite.  Have nausea or vomiting.  Develop jaundice.  Develop easy bruising or bleeding. Get help right away if you:  Vomit bright red blood or a material that looks like coffee grounds.  Have blood in your stools.  Notice that your stools appear black and tarry.  Become confused.  Have chest pain or trouble breathing. Summary  Cirrhosis is chronic liver injury. Liver damage cannot be reversed. Common causes are hepatitis C and long-term alcohol abuse.  Tests used to  diagnose cirrhosis include blood tests, imaging tests, and liver biopsy.  Treatment for this condition involves treating the underlying cause. Avoid alcohol, drugs, salt, and medicines that may damage your liver.  Contact your health care provider if you develop ascites, edema, jaundice, fever, nausea or vomiting, easy bruising or bleeding, or worsening fatigue. This information is not intended to replace advice given to you by your health care provider. Make sure you discuss any questions you have with your health care provider. Document Revised: 10/15/2018 Document Reviewed: 05/15/2017 Elsevier Patient Education  [place].   Hypoxia Hypoxia is a condition that happens when there is a lack of oxygen in the body's tissues and organs. When there is not enough oxygen, organs cannot work as they should. This causes serious problems throughout the body and in the brain. What are the causes? This condition may be caused by:  Exposure to high altitude.  A collapsed lung (pneumothorax).  Lung infection (pneumonia).  Lung injury.  Long-term (chronic) lung disease, such as COPD (chronic obstructive pulmonary disease).  Blood collecting in the chest cavity (hemothorax).  Food, saliva, or vomit getting into the airway (aspiration).  Reduced blood flow (ischemia).  Severe blood loss.  Slow or shallow breathing (hypoventilation).  Blood disorders, such as anemia.  Carbon monoxide poisoning.  The heart suddenly stopping (cardiac arrest).  Anesthetic  medicines.  Drowning.  Choking. What are the signs or symptoms? Symptoms of this condition include:  Headache.  Fatigue.  Drowsiness.  Forgetfulness.  Nausea.  Confusion.  Shortness of breath.  Dizziness.  Bluish color of the skin, lips, or nail beds (cyanosis).  Change in consciousness or awareness. If hypoxia is not treated, it can lead to convulsions, loss of consciousness (coma), or brain damage. How is  this diagnosed? This condition may be diagnosed based on:  A physical exam.  Blood tests.  A test that measures how much oxygen is in your blood (pulse oximetry). This is done with a sensor that is placed on your finger, toe, or earlobe.  Chest X-ray.  Tests to check your lung function (pulmonary function tests).  A test to check the electrical activity of your heart (electrocardiogram, ECG). You may have other tests to determine the cause of your hypoxia. How is this treated?  Treatment for this condition depends on what is causing the hypoxia. You will likely be treated with oxygen therapy. This may be done by giving you oxygen through a face mask or through tubes in your nose. Your health care provider may also recommend other therapies to treat the underlying cause of your hypoxia. Follow these instructions at home:  Take over-the-counter and prescription medicines only as told by your health care provider.  Do not use any products that contain nicotine or tobacco, such as cigarettes and e-cigarettes. If you need help quitting, ask your health care provider.  Avoid secondhand smoke.  Work with your health care provider to manage any chronic conditions you have that may be causing hypoxia, such as COPD.  Keep all follow-up visits as told by your health care provider. This is important. Contact a health care provider if:  You have a fever.  You have trouble breathing, even after treatment.  You become extremely short of breath when you exercise. Get help right away if:  Your shortness of breath gets worse, especially with normal or very little activity.  Your skin, lips, or nail beds have a bluish color.  You become confused or you cannot think properly.  You have chest pain. Summary  Hypoxia is a condition that happens when there is a lack of oxygen in the body's tissues and organs.  If hypoxia is not treated, it can lead to convulsions, loss of consciousness  (coma), or brain damage.  Symptoms of hypoxia can include a headache, shortness of breath, confusion, nausea, and a bluish skin color.  Hypoxia has many possible causes, including exposure to high altitude, carbon monoxide poisoning, or other health issues, such as blood disorders or cardiac arrest.  Hypoxia is usually treated with oxygen therapy. This information is not intended to replace advice given to you by your health care provider. Make sure you discuss any questions you have with your health care provider. Document Revised: 06/07/2017 Document Reviewed: 08/13/2016 Elsevier Patient Education  [place] Oxygen Use, Adult When a medical condition keeps you from getting enough oxygen, your health care provider may instruct you to take extra oxygen at home. Your health care provider will let you know:  When to take oxygen.  For how long to take oxygen.  How quickly oxygen should be delivered (flow rate), in liters per minute (LPM or L/M). Home oxygen can be given through:  A mask.  A nasal cannula. This is a device or tube that goes in the nostrils.  A transtracheal catheter. This is a  small, flexible tube placed in the trachea.  A tracheostomy. This is a surgically made opening in the trachea. These devices are connected with tubing to an oxygen source, such as:  A tank. Tanks hold oxygen in gas form. They must be replaced when the oxygen is used up.  A liquid oxygen device. This holds oxygen in liquid form. It must be replaced when the oxygen is used up.  An oxygen concentrator machine. This filters oxygen in the room. It uses electricity, so you must have a backup cylinder of oxygen in case the power goes out. Supplies needed: To use oxygen, you will need:  A mask, nasal cannula, transtracheal catheter, or tracheostomy.  An oxygen tank, a liquid oxygen device, or an oxygen concentrator.  The tape that your health care provider recommends  (optional). If you use a transtracheal catheter and your prescribed flow rate is 1 LPM or greater, you will also need a humidifier. Risks and complications  Fire. This can happen if the oxygen is exposed to a heat source, flame, or spark.  Injury to skin. This can happen if liquid oxygen touches your skin.  Organ damage. This can happen if you get too little oxygen. How to use oxygen Your health care provider or a representative from your [place] will show you how to use your oxygen device. Follow her or his instructions. The instructions may look something like this: 1. Wash your hands. 2. If you use an oxygen concentrator, make sure it is plugged in. 3. Place one end of the tube into the port on the tank, device, or machine. 4. Place the mask over your nose and mouth. Or, place the nasal cannula and secure it with tape if instructed. If you use a tracheostomy or transtracheal catheter, connect it to the oxygen source as directed. 5. Make sure the liter-flow setting on the machine is at the level prescribed by your health care provider. 6. Turn on the machine or adjust the knob on the tank or device to the correct liter-flow setting. 7. When you are done, turn off and unplug the machine, or turn the knob to OFF. How to clean and care for the oxygen supplies Nasal cannula  Clean it with a warm, wet cloth daily or as needed.  Wash it with a liquid soap once a week.  Rinse it thoroughly once or twice a week.  Replace it every 2-4 weeks.  If you have an infection, such as a cold or pneumonia, change the cannula when you get better. Mask  Replace it every 2-4 weeks.  If you have an infection, such as a cold or pneumonia, change the mask when you get better. Humidifier bottle  Wash the bottle between each refill: ? Wash it with soap and warm water. ? Rinse it thoroughly. ? Disinfect it and its top. ? Air-dry it.  Make sure it is dry before you refill it. Oxygen  concentrator  Clean the air filter at least twice a week according to directions from your home medical equipment and service company.  Wipe down the cabinet every day. To do this: ? Unplug the unit. ? Wipe down the cabinet with a damp cloth. ? Dry the cabinet. Other equipment  Change any extra tubing every 1-3 months.  Follow instructions from your health care provider about taking care of any other equipment. Safety tips Fire safety tips   Keep your oxygen and oxygen supplies at least 5 ft away from sources of heat,  flames, and sparks at all times.  Do not allow smoking near your oxygen. Put up "no smoking" signs in your home. Avoid smoking areas when in public.  Do not use materials that can burn (are flammable) while you use oxygen.  When you go to a restaurant with portable oxygen, ask to be seated in the nonsmoking section.  Keep a Data processing manager close by. Let your fire department know that you have oxygen in your home.  Test your home smoke detectors regularly. Traveling  Secure your oxygen tank in the vehicle so that it does not move around. Follow instructions from your medical device company about how to safely secure your tank.  Make sure you have enough oxygen for the amount of time you will be away from home.  If you are planning air travel, contact the airline to find out if they allow the use of an approved portable oxygen concentrator. You may also need documents from your health care provider and medical device company before you travel. General safety tips  If you use an oxygen cylinder, make sure it is in a stand or secured to an object that will not move (fixed object).  If you use liquid oxygen, make sure its container is kept upright.  If you use an oxygen concentrator: ? [employer] movement psychotherapist company. Make sure you are given priority service in the event that your power goes out. ? Avoid using extension cords, if possible. Follow these instructions  at home:  Use oxygen only as told by your health care provider.  Do not use alcohol or other drugs that make you relax (sedating drugs) unless instructed. They can slow down your breathing rate and make it hard to get in enough oxygen.  Know how and when to order a refill of oxygen.  Always keep a spare tank of oxygen. Plan ahead for holidays when you may not be able to get a prescription filled.  Use water-based lubricants on your lips or nostrils. Do not use oil-based products like petroleum jelly.  To prevent skin irritation on your cheeks or behind your ears, tuck some gauze under the tubing. Contact a health care provider if:  You get headaches often.  You have shortness of breath.  You have a lasting cough.  You have anxiety.  You are sleepy all the time.  You develop an illness that affects your breathing.  You cannot exercise at your regular level.  You are restless.  You have difficult or irregular breathing, and it is getting worse.  You have a fever.  You have persistent redness under your nose. Get help right away if:  You are confused.  You have blue lips or fingernails.  You are struggling to breathe. Summary  Your health care provider or a representative from your [place] will show you how to use your oxygen device. Follow her or his instructions.  If you use an oxygen concentrator, make sure it is plugged in.  Make sure the liter-flow setting on the machine is at the level prescribed by your health care provider.  Keep your oxygen and oxygen supplies at least 5 ft away from sources of heat, flames, and sparks at all times. This information is not intended to replace advice given to you by your health care provider. Make sure you discuss any questions you have with your health care provider. Document Revised: 12/12/2017 Document Reviewed: 01/17/2016 Elsevier Patient Education  [place].  Acute Respiratory Failure,  Adult  Acute respiratory failure occurs when there is not enough oxygen passing from your lungs to your body. When this happens, your lungs have trouble removing carbon dioxide from the blood. This causes your blood oxygen level to drop too low as carbon dioxide builds up. Acute respiratory failure is a medical emergency. It can develop quickly, but it is temporary if treated promptly. Your lung capacity, or how much air your lungs can hold, may improve with time, exercise, and treatment. What are the causes? There are many possible causes of acute respiratory failure, including:  Lung injury.  Chest injury or damage to the ribs or tissues near the lungs.  Lung conditions that affect the flow of air and blood into and out of the lungs, such as pneumonia, acute respiratory distress syndrome, and cystic fibrosis.  Medical conditions, such as strokes or spinal cord injuries, that affect the muscles and nerves that control breathing.  Blood infection (sepsis).  Inflammation of the pancreas (pancreatitis).  A blood clot in the lungs (pulmonary embolism).  A large-volume blood transfusion.  Burns.  Near-drowning.  Seizure.  Smoke inhalation.  Reaction to medicines.  Alcohol or drug overdose. What increases the risk? This condition is more likely to develop in people who have:  A blocked airway.  Asthma.  A condition or disease that damages or weakens the muscles, nerves, bones, or tissues that are involved in breathing.  A serious infection.  A health problem that blocks the unconscious reflex that is involved in breathing, such as hypothyroidism or sleep apnea.  A lung injury or trauma. What are the signs or symptoms? Trouble breathing is the main symptom of acute respiratory failure. Symptoms may also include:  Rapid breathing.  Restlessness or anxiety.  Skin, lips, or fingernails that appear blue (cyanosis).  Rapid heart rate.  Abnormal heart rhythms  (arrhythmias).  Confusion or changes in behavior.  Tiredness or loss of energy.  Feeling sleepy or having a loss of consciousness. How is this diagnosed? Your health care provider can diagnose acute respiratory failure with a medical history and physical exam. During the exam, your health care provider will listen to your heart and check for crackling or wheezing sounds in your lungs. Your may also have tests to confirm the diagnosis and determine what is causing respiratory failure. These tests may include:  Measuring the amount of oxygen in your blood (pulse oximetry). The measurement comes from a small device that is placed on your finger, earlobe, or toe.  Other blood tests to measure blood gases and to look for signs of infection.  Sampling your cerebral spinal fluid or tracheal fluid to check for infections.  Chest X-ray to look for fluid in spaces that should be filled with air.  Electrocardiogram (ECG) to look at the heart's electrical activity. How is this treated? Treatment for this condition usually takes places in a hospital intensive care unit (ICU). Treatment depends on what is causing the condition. It may include one or more treatments until your symptoms improve. Treatment may include:  Supplemental oxygen. Extra oxygen is given through a tube in the nose, a face mask, or a hood.  A device such as a continuous positive airway pressure (CPAP) or bi-level positive airway pressure (BiPAP or BPAP) machine. This treatment uses mild air pressure to keep the airways open. A mask or other device will be placed over your nose or mouth. A tube that is connected to a motor will deliver oxygen through the mask.  Ventilator. This treatment  helps move air into and out of the lungs. This may be done with a bag and mask or a machine. For this treatment, a tube is placed in your windpipe (trachea) so air and oxygen can flow to the lungs.  Extracorporeal membrane oxygenation (ECMO). This  treatment temporarily takes over the function of the heart and lungs, supplying oxygen and removing carbon dioxide. ECMO gives the lungs a chance to recover. It may be used if a ventilator is not effective.  Tracheostomy. This is a procedure that creates a hole in the neck to insert a breathing tube.  Receiving fluids and medicines.  Rocking the bed to help breathing. Follow these instructions at home:  Take over-the-counter and prescription medicines only as told by your health care provider.  Return to normal activities as told by your health care provider. Ask your health care provider what activities are safe for you.  Keep all follow-up visits as told by your health care provider. This is important. How is this prevented? Treating infections and medical conditions that may lead to acute respiratory failure can help prevent the condition from developing. Contact a health care provider if:  You have a fever.  Your symptoms do not improve or they get worse. Get help right away if:  You are having trouble breathing.  You lose consciousness.  Your have cyanosis or turn blue.  You develop a rapid heart rate.  You are confused. These symptoms may represent a serious problem that is an emergency. Do not wait to see if the symptoms will go away. Get medical help right away. Call your local emergency services (911 in the [place] Do not drive yourself to the hospital. This information is not intended to replace advice given to you by your health care provider. Make sure you discuss any questions you have with your health care provider. Document Revised: 06/07/2017 Document Reviewed: 01/11/2016 Elsevier Patient Education  [place].

## 2020-07-01 NOTE — Discharge Summary (Signed)
Physician Discharge Summary  Stacey Harris [id number] DOB: 08-17-1928 DOA: 06/27/2020  PCP: [name], MD  Admit date: 06/27/2020 Discharge date: 07/01/2020  Admitted From: Home Discharge disposition: Home with home health, home oxygen   Code Status: DNR  Diet Recommendation: Cardiac diet.  Discharge Diagnosis:   Principal Problem:   Acute respiratory failure with hypoxia (HCC) Active Problems:   Abnormal CT scan of lung   Hyperlipidemia associated with type 2 diabetes mellitus (HCC)   Essential hypertension   Hypothyroid   Thrombocytopenia (HCC)   GERD (gastroesophageal reflux disease)   Normocytic anemia   Controlled type 2 diabetes mellitus with diabetic autonomic neuropathy (HCC)   CKD (chronic kidney disease)   H/O: CVA (cerebrovascular accident)   Myelodysplastic syndrome ([place])   Elevated troponin   Elevated lactic acid level   Other cirrhosis of liver ([place])   Chief complaint: Shortness of breath  Brief narrative: Stacey Harris is a 86 y.o. female with PMH significant for DM2, HLD, HTN, CKD three, baseline creatinine 1.5, myelodysplasia, chronic thrombocytopenia, hypothyroidism, osteoarthritis.  She was recently admitted 9/21-9/24 with acute ischemic stroke with left hemiparesis and went to CIR subsequently Patient presented to the ED on 12/20 with complaint of shortness of breath..  Also reported weight gain, worsening pedal edema, progressive decline in physical strength. She was noted to be hypoxic to 80s, lactic acid level elevated to four. She was started on IV Levaquin, IV Solu-Medrol and given boluses of IV fluid.  CT scan done on admission showed prominent interstitial markings?  ILD?  Pulmonary edema.   Also found to have portal hypertension splenomegaly and esophageal varices with large hiatal hernia  Subjective: Patient was seen and examined this afternoon.  Elderly Caucasian female.  Not in distress.  Remains on 3 L oxygen by nasal  cannula.  Assessment/Plan: Acute respiratory failure with hypoxia Acute exacerbation of diastolic congestive heart failure Essential hypertension -Diuresed with IV Lasix. -12/21, echo with EF 60 to [id number], grade 2 diastolic dysfunction, -Net IO Since Admission: -1,982 mL [07/01/20 1110].  -Home meds include Lasix 20 mg daily, losartan 25 mg daily, -Continue Lasix at home.  Keep losartan on hold.  Nadolol has been started because of cirrhosis. -Continue monitoring blood pressure at home.  Chronic interstitial infiltrates -Patient never smoked but had exposure to her husband's smoking 40 years ago.  She does not have an established diagnosis of COPD.  However on review of her chest x-rays since 2010, he has chronic interstitial markings which are progressively worsening.  Prior to admission, patient was not requiring supplemental oxygen at home but lately she gets winded even with minimal exertion. -Imagings on admission was negative for any acute infiltrate.   -I believe she has slowly progressive underlying interstitial lung disease for which she needs to follow-up with pulmonology as an outpatient.  Outpatient referral given.  Her primary symptom is cough for which I have started her on Mucinex. -She is also on a 5-day course of prednisone. Recent Labs  Lab 06/27/20 1211 06/27/20 1345 06/27/20 2012 06/27/20 2340 06/28/20 0245 06/28/20 1439 06/29/20 0112 06/30/20 0224 07/01/20 0331  WBC 5.6  --   --   --  4.7  --  8.1 6.7 6.7  LATICACIDVEN 4.0* 2.8*  --  1.7  --  2.5*  --   --   --   PROCALCITON  --   --  0.10  --  <0.10 0.13 0.12 0.11  --    AKI on CKD  stage IIIb -Baseline creatinine less than 1.5.  With IV Lasix, creatinine worsened up to 1.72.  Improved after IV Lasix was held.  Creatinine at baseline today. -Oral Lasix to continue at home. -Nephrology follow-up as an outpatient. Recent Labs    07/31/19 1214 04/05/20 1152 04/08/20 1022 06/10/20 1317 06/27/20 1211  06/28/20 0245 06/29/20 0112 06/30/20 0224 07/01/20 0331  BUN 21 24* 25* 27* 20 20 35* 41* 36*  CREATININE 1.28* 1.44* 1.48* 1.56* 1.48* 1.36* 1.41* 1.72* 1.48*   Liver cirrhosis -Per ultrasound, patient has liver cirrhosis with portal hypertension, splenomegaly and esophageal varices. -Liver enzymes stable.  Hepatitis panel negative. -GI consult appreciated.  Recommended nadolol 10 mg daily for prophylaxis of esophageal varices.  To hold if heart rate less than 60 bpm. -Patient to follow-up in GI clinic in 6 to 8 weeks.  Type 2 diabetes mellitus -A1c 7.9 on 12/20. -Home meds include glimepiride 4 mg daily.  Resume the same at home.  Myelodysplasia with chronic thrombocytopenia and iron deficiency anemia -Prior to admission, patient was on aspirin.  Daughter states she kept that on hold because of easy bruisability.  I would stop aspirin completely. Recent Labs    06/27/20 2012 06/27/20 2018 06/28/20 0245 06/29/20 0112 06/30/20 0224 07/01/20 0331  HGB  --  9.5* 10.4* 9.8* 9.8* 11.1*  MCV  --   --  103.2* 99.7 98.3 98.2  VITAMINB12  --   --  1,152*  --   --   --   FOLATE  --   --  33.7  --   --   --   FERRITIN 152  --   --   --   --   --   TIBC  --   --  185*  --   --   --   IRON  --   --  21*  --   --   --   RETICCTPCT  --   --  2.4  --   --   --    Recent Labs  Lab 06/27/20 1211 06/28/20 0245 06/29/20 0112 06/30/20 0224 07/01/20 0331  PLT 81* 79* 85* 92* 96*   Stable for discharge to home today with home oxygen and home health services.  Wound care: Incision 06/22/11 Back Bilateral (Active)  Date First Assessed/Time First Assessed: 06/22/11 0841   Location: Back  Location Orientation: Bilateral    Assessments 06/22/2011  9:35 AM 06/23/2011  7:00 AM  Dressing Type Gauze (Comment);Tape dressing Gauze (Comment)  Dressing Clean;Dry;Intact Clean;Dry;Intact  Site / Wound Assessment -- Clean;Dry  Drainage Amount -- None     No Linked orders to display     Incision  (Closed) 06/06/17 Wrist Right (Active)  Date First Assessed/Time First Assessed: 06/06/17 1418   Location: Wrist  Location Orientation: Right    Assessments 06/06/2017  2:18 PM 06/06/2017  3:30 PM  Dressing Type Abdominal pads;Gauze (Comment);Compression wrap;Impregnated gauze (bismuth) Abdominal pads;Gauze (Comment);Compression wrap;Impregnated gauze (bismuth)  Dressing -- Clean;Dry;Intact  Site / Wound Assessment -- Dressing in place / Unable to assess  Drainage Amount -- None     No Linked orders to display    Discharge Exam:   Vitals:   07/01/20 0333 07/01/20 0732 07/01/20 0839 07/01/20 1038  BP: 135/76 (!) 155/111 (!) 143/92   Pulse: 73 (!) 102 72 71  Resp: 19 18    Temp: 97.8 F (36.6 C) 97.9 F (36.6 C)    TempSrc: Oral Oral    SpO2: 100% 100%  97%  Weight:      Height:        Body mass index is 27.46 kg/m.  General exam: Pleasant, elderly Caucasian female.  Not in distress.  On low-flow oxygen Skin: No rashes, lesions or ulcers. HEENT: Atraumatic, normocephalic, no obvious bleeding Lungs: Diminished air entry in both bases CVS: Regular rate and rhythm, no murmur GI/Abd soft, nontender, nondistended, bowel sound present CNS: Alert, awake, oriented x3 Psychiatry: Mood appropriate Extremities: Improved bilateral pedal edema  Follow ups:   Discharge Instructions    Diet - low sodium heart healthy   Complete by: As directed    Increase activity slowly   Complete by: As directed       Follow-up Information    Tower, Wynelle [name], MD Follow up.   Specialties: Family Medicine, Radiology Contact information: [address] [place] [address] [phone]               Recommendations for Outpatient Follow-Up:   1. Follow-up with PCP, GI, nephrology as an outpatient  Discharge Instructions:  Follow with Primary MD Tower, Wynelle [name], MD in 7 days   Get CBC/CMP checked in next visit within 1 week by PCP or SNF MD ( we routinely change or add  medications that can affect your baseline labs and fluid status, therefore we recommend that you get the mentioned basic workup next visit with your PCP, your PCP may decide not to get them or add new tests based on their clinical decision)  On your next visit with your PCP, please Get Medicines reviewed and adjusted.  Please request your PCP  to go over all Hospital Tests and Procedure/Radiological results at the follow up, please get all Hospital records sent to your Prim MD by signing hospital release before you go home.  Activity: As tolerated with Full fall precautions use walker/cane & assistance as needed  For Heart failure patients - Check your Weight same time everyday, if you gain over 2 pounds, or you develop in leg swelling, experience more shortness of breath or chest pain, call your Primary MD immediately. Follow Cardiac Low Salt Diet and 1.5 lit/day fluid restriction.  If you have smoked or chewed Tobacco in the last 2 yrs please stop smoking, stop any regular Alcohol  and or any Recreational drug use.  If you experience worsening of your admission symptoms, develop shortness of breath, life threatening emergency, suicidal or homicidal thoughts you must seek medical attention immediately by calling 911 or calling your MD immediately  if symptoms less severe.  You Must read complete instructions/literature along with all the possible adverse reactions/side effects for all the Medicines you take and that have been prescribed to you. Take any new Medicines after you have completely understood and accpet all the possible adverse reactions/side effects.   Do not drive, operate heavy machinery, perform activities at heights, swimming or participation in water activities or provide baby sitting services if your were admitted for syncope or siezures until you have seen by Primary MD or a Neurologist and advised to do so again.  Do not drive when taking Pain medications.  Do not take more  than prescribed Pain, Sleep and Anxiety Medications  Wear Seat belts while driving.   Please note You were cared for by a hospitalist during your hospital stay. If you have any questions about your discharge medications or the care you received while you were in the hospital after you are discharged, you can call the unit  and asked to speak with the hospitalist on call if the hospitalist that took care of you is not available. Once you are discharged, your primary care physician will handle any further medical issues. Please note that NO REFILLS for any discharge medications will be authorized once you are discharged, as it is imperative that you return to your primary care physician (or establish a relationship with a primary care physician if you do not have one) for your aftercare needs so that they can reassess your need for medications and monitor your lab values.    Allergies as of 07/01/2020      Reactions   Keflex [cephalexin]    Did ok with lower dose, higher dose caused a rash   Mavik [trandolapril]    Cough       Medication List    STOP taking these medications   aspirin 81 MG EC tablet     TAKE these medications   acetaminophen 325 MG tablet Commonly known as: TYLENOL Take 1-2 tablets (325-650 mg total) by mouth every 4 (four) hours as needed for mild pain.   albuterol 108 (90 Base) MCG/ACT inhaler Commonly known as: VENTOLIN HFA Inhale 2 puffs into the lungs every 4 (four) hours as needed for wheezing (cough).   CRANBERRY PO Take 2 tablets by mouth daily.   dextromethorphan 30 MG/5ML liquid Commonly known as: DELSYM Take 60 mg by mouth at bedtime as needed for cough. 43ml   Fish Oil 1200 MG Caps Take 1,200 mg by mouth daily.   furosemide 20 MG tablet Commonly known as: LASIX TAKE 1 TABLET BY MOUTH EVERY DAY   glimepiride 4 MG tablet Commonly known as: AMARYL TAKE 1 TABLET BY MOUTH EVERY DAY BEFORE BREAKFAST What changed:   how much to take  how to  take this  when to take this  additional instructions   guaiFENesin 600 MG 12 hr tablet Commonly known as: MUCINEX Take 1 tablet (600 mg total) by mouth 2 (two) times daily.   levothyroxine 50 MCG tablet Commonly known as: SYNTHROID Take 1 tablet (50 mcg total) by mouth daily.   losartan 25 MG tablet Commonly known as: COZAAR TAKE 2 TABLETS BY MOUTH EVERY DAY What changed: how much to take   multivitamin capsule Take 1 capsule by mouth daily.   nadolol 20 MG tablet Commonly known as: CORGARD Take 0.5 tablets (10 mg total) by mouth daily. Start taking on: July 02, 2020   omeprazole 40 MG capsule Commonly known as: PRILOSEC Take 1 capsule (40 mg total) by mouth daily.   predniSONE 20 MG tablet Commonly known as: DELTASONE Take 1 tablet (20 mg total) by mouth daily before breakfast for 5 days. Start taking on: July 02, 2020   rosuvastatin 10 MG tablet Commonly known as: Crestor Take 1 tablet (10 mg total) by mouth daily.   True Metrix Blood Glucose Test test strip Generic drug: glucose blood CHECK BLOOD SUGAR ONCE DAILY AND AS DIRECTED.DX E11.43   TRUEplus Lancets 30G Misc 1 EACH BY SUBDERMAL ROUTE DAILY. CHECK BLOOD SUGAR ONCE DAILY AND AS DIRECTED.DX E11.43   VITAMIN D PO Take 5,000 Units by mouth daily.            Durable Medical Equipment  (From admission, onward)         Start     Ordered   07/01/20 1048  For home use only DME oxygen  Once       Question Answer Comment  Length of  Need Lifetime   Mode or (Route) Nasal cannula   Liters per Minute 3   Frequency Continuous (stationary and portable oxygen unit needed)   Oxygen conserving device Yes   Oxygen delivery system Gas      07/01/20 1047          Time coordinating discharge: 35 minutes  The results of significant diagnostics from this hospitalization (including imaging, microbiology, ancillary and laboratory) are listed below for reference.    Procedures and Diagnostic  Studies:   DG Chest 2 View  Result Date: 06/27/2020 CLINICAL DATA:  Shortness of breath EXAM: CHEST - 2 VIEW COMPARISON:  June 10, 2020 and April 05, 2020 FINDINGS: There is persistent elevation of the right hemidiaphragm. Widespread interstitial opacity persists, likely in large part representing fibrosis. No appreciable new opacity compared to most recent study. Heart is slightly enlarged with pulmonary vascularity normal. No adenopathy. No bone lesions. IMPRESSION: Suspected widespread fibrosis, grossly stable. A degree of underlying pulmonary edema and/or interstitial pneumonia difficult to exclude in this circumstance. Stable cardiac silhouette. Stable elevation right hemidiaphragm. Electronically Signed   By: Lowella Grip III M.D.   On: 06/27/2020 12:23   CT Angio Chest PE W/Cm &/Or Wo Cm  Result Date: 06/27/2020 CLINICAL DATA:  PE suspected. EXAM: CT ANGIOGRAPHY CHEST WITH CONTRAST TECHNIQUE: Multidetector CT imaging of the chest was performed using the standard protocol during bolus administration of intravenous contrast. Multiplanar CT image reconstructions and MIPs were obtained to evaluate the vascular anatomy. CONTRAST:  22mL OMNIPAQUE IOHEXOL 350 MG/ML SOLN COMPARISON:  None. FINDINGS: Cardiovascular: Evaluation is limited by respiratory motion artifact. Within that limitation, there is no central pulmonary embolus. The size of the main pulmonary artery is normal. Cardiomegaly with coronary artery calcification. The course and caliber of the aorta are normal. There is mild atherosclerotic calcification. Opacification decreased due to pulmonary arterial phase contrast bolus timing. Mediastinum/Nodes: --mild mediastinal adenopathy is noted. --hilar adenopathy is noted. -- No axillary lymphadenopathy. --there is mild left supraclavicular adenopathy. -- Normal thyroid gland where visualized. -  Unremarkable esophagus. Lungs/Pleura: Prominent interstitial lung markings and reticular  airspace opacities are noted. There are areas of ground-glass opacification in the bilateral upper lobes, right worse than left. There is bronchial wall thickening and mucus plugging bilaterally. There is no pneumothorax. There is no large pleural effusion. Upper Abdomen: Contrast bolus timing is not optimized for evaluation of the abdominal organs. There is recanalization of the umbilical vein in addition to splenomegaly, both consistent with cirrhosis with stigmata of portal hypertension. There is a large hiatal hernia with multiple esophageal varices. Musculoskeletal: No chest wall abnormality. No bony spinal canal stenosis. Review of the MIP images confirms the above findings. IMPRESSION: 1. Evaluation is limited by respiratory motion artifact. Within that limitation, there is no central pulmonary embolus. 2. Prominent interstitial lung markings and reticular airspace opacities, with areas of ground-glass opacification in the bilateral upper lobes, right worse than left. Findings are nonspecific but can be seen in patients with interstitial lung disease or pulmonary edema. A superimposed infectious process is not excluded. 3. Bronchial wall thickening and mucus plugging bilaterally. Findings can be seen in patients with reactive or infectious bronchiolitis. 4. Cardiomegaly with coronary artery disease. 5. Cirrhosis with stigmata of portal hypertension including splenomegaly and esophageal varices. 6. Large hiatal. Aortic Atherosclerosis (ICD10-I70.0). Electronically Signed   By: Constance Holster M.D.   On: 06/27/2020 16:44   ECHOCARDIOGRAM COMPLETE  Result Date: 06/28/2020    ECHOCARDIOGRAM REPORT   Patient  Name:   CORTNEY MCCURLEY Date of Exam: 06/28/2020 Medical Rec #:  [id number]      Height:       62.0 in Accession #:    [id number]     Weight:       154.2 lb Date of Birth:  08-18-1928     BSA:          1.712 m Patient Age:    86 years       BP:           147/71 mmHg Patient Gender: F              HR:            86 bpm. Exam Location:  Inpatient Procedure: 2D Echo Indications:    Elevated Troponin  History:        Patient has prior history of Echocardiogram examinations, most                 recent 03/30/2019. CHF, Stroke; Risk Factors:Diabetes,                 Dyslipidemia and Hypertension.  Sonographer:    Lavenia Atlas Referring Phys: [phone] ANASTASSIA DOUTOVA IMPRESSIONS  1. Left ventricular ejection fraction, by estimation, is 60 to 65%. The left ventricle has normal function. The left ventricle has no regional wall motion abnormalities. Left ventricular diastolic parameters are consistent with Grade II diastolic dysfunction (pseudonormalization). Elevated left ventricular end-diastolic pressure. The E/e' is 16.  2. Right ventricular systolic function is mildly reduced. The right ventricular size is normal. There is mildly elevated pulmonary artery systolic pressure. The estimated right ventricular systolic pressure is 37.1 mmHg.  3. Left atrial size was mildly dilated.  4. The mitral valve is abnormal. Mild mitral valve regurgitation.  5. Annular calcification. The tricuspid valve is abnormal.  6. The aortic valve is tricuspid. Aortic valve regurgitation is trivial. Mild aortic valve sclerosis is present, with no evidence of aortic valve stenosis.  7. The inferior vena cava is normal in size with greater than 50% respiratory variability, suggesting right atrial pressure of 3 mmHg. Comparison(s): Changes from prior study are noted. 03/30/19: LVEF 60-65%, normal RV systolic function. FINDINGS  Left Ventricle: Left ventricular ejection fraction, by estimation, is 60 to 65%. The left ventricle has normal function. The left ventricle has no regional wall motion abnormalities. The left ventricular internal cavity size was normal in size. There is  no left ventricular hypertrophy. Left ventricular diastolic parameters are consistent with Grade II diastolic dysfunction (pseudonormalization). Elevated left ventricular  end-diastolic pressure. The E/e' is 16. Right Ventricle: The right ventricular size is normal. No increase in right ventricular wall thickness. Right ventricular systolic function is mildly reduced. There is mildly elevated pulmonary artery systolic pressure. The tricuspid regurgitant velocity  is 2.92 m/s, and with an assumed right atrial pressure of 3 mmHg, the estimated right ventricular systolic pressure is 37.1 mmHg. Left Atrium: Left atrial size was mildly dilated. Right Atrium: Right atrial size was normal in size. Pericardium: There is no evidence of pericardial effusion. Mitral Valve: The mitral valve is abnormal. Mild mitral annular calcification. Mild mitral valve regurgitation. Tricuspid Valve: Annular calcification. The tricuspid valve is abnormal. Tricuspid valve regurgitation is mild. Aortic Valve: The aortic valve is tricuspid. Aortic valve regurgitation is trivial. Aortic regurgitation PHT measures 743 msec. Mild aortic valve sclerosis is present, with no evidence of aortic valve stenosis. Pulmonic Valve: The pulmonic valve was grossly normal. Pulmonic valve regurgitation  is trivial. Aorta: The aortic root and ascending aorta are structurally normal, with no evidence of dilitation. Venous: The inferior vena cava is normal in size with greater than 50% respiratory variability, suggesting right atrial pressure of 3 mmHg. IAS/Shunts: No atrial level shunt detected by color flow Doppler.  LEFT VENTRICLE PLAX 2D LVIDd:         4.70 cm  Diastology LVIDs:         3.00 cm  LV e' medial:    5.77 cm/s LV PW:         1.00 cm  LV E/e' medial:  18.5 LV IVS:        1.00 cm  LV e' lateral:   7.29 cm/s LVOT diam:     1.90 cm  LV E/e' lateral: 14.7 LV SV:         64 LV SV Index:   38 LVOT Area:     2.84 cm  RIGHT VENTRICLE RV Basal diam:  3.20 cm RV S prime:     5.77 cm/s TAPSE (M-mode): 2.1 cm LEFT ATRIUM             Index       RIGHT ATRIUM           Index LA diam:        3.60 cm 2.10 cm/m  RA Area:     12.30  cm LA Vol (A2C):   56.2 ml 32.83 ml/m RA Volume:   27.00 ml  15.77 ml/m LA Vol (A4C):   42.2 ml 24.65 ml/m LA Biplane Vol: 52.9 ml 30.90 ml/m  AORTIC VALVE LVOT Vmax:   93.30 cm/s LVOT Vmean:  65.900 cm/s LVOT VTI:    0.227 m AI PHT:      743 msec  AORTA Ao Root diam: 2.60 cm MITRAL VALVE                TRICUSPID VALVE MV Area (PHT): 2.62 cm     TR Peak grad:   34.1 mmHg MV Decel Time: 289 msec     TR Vmax:        292.00 cm/s MV E velocity: 107.00 cm/s MV A velocity: 85.30 cm/s   SHUNTS MV E/A ratio:  1.25         Systemic VTI:  0.23 m                             Systemic Diam: 1.90 cm Lyman Bishop MD Electronically signed by Lyman Bishop MD Signature Date/Time: 06/28/2020/10:21:03 AM    Final    VAS [place] LOWER EXTREMITY VENOUS (DVT)  Result Date: 06/28/2020  Lower Venous DVT Study Indications: Shortness of breath.  Comparison Study: No prior studies. Performing Technologist: Rogelia Rohrer  Examination Guidelines: A complete evaluation includes B-mode imaging, spectral Doppler, color Doppler, and power Doppler as needed of all accessible portions of each vessel. Bilateral testing is considered an integral part of a complete examination. Limited examinations for reoccurring indications may be performed as noted. The reflux portion of the exam is performed with the patient in reverse Trendelenburg.  +---------+---------------+---------+-----------+----------+--------------+  RIGHT     Compressibility Phasicity Spontaneity Properties Thrombus Aging  +---------+---------------+---------+-----------+----------+--------------+  CFV       Full            Yes       Yes                                    +---------+---------------+---------+-----------+----------+--------------+  SFJ       Full                                                             +---------+---------------+---------+-----------+----------+--------------+  FV Prox   Full            Yes       Yes                                     +---------+---------------+---------+-----------+----------+--------------+  FV Mid    Full            Yes       Yes                                    +---------+---------------+---------+-----------+----------+--------------+  FV Distal Full            Yes       Yes                                    +---------+---------------+---------+-----------+----------+--------------+  PFV       Full                                                             +---------+---------------+---------+-----------+----------+--------------+  POP       Full            Yes       Yes                                    +---------+---------------+---------+-----------+----------+--------------+  PTV       Full                                                             +---------+---------------+---------+-----------+----------+--------------+  PERO      Full                                                             +---------+---------------+---------+-----------+----------+--------------+   +---------+---------------+---------+-----------+----------+--------------+  LEFT      Compressibility Phasicity Spontaneity Properties Thrombus Aging  +---------+---------------+---------+-----------+----------+--------------+  CFV       Full            Yes       Yes                                    +---------+---------------+---------+-----------+----------+--------------+  SFJ       Full                                                             +---------+---------------+---------+-----------+----------+--------------+  FV Prox   Full            Yes       Yes                                    +---------+---------------+---------+-----------+----------+--------------+  FV Mid    Full            Yes       Yes                                    +---------+---------------+---------+-----------+----------+--------------+  FV Distal Full            Yes       Yes                                     +---------+---------------+---------+-----------+----------+--------------+  PFV       Full                                                             +---------+---------------+---------+-----------+----------+--------------+  POP       Full            Yes       Yes                                    +---------+---------------+---------+-----------+----------+--------------+  PTV       Full                                                             +---------+---------------+---------+-----------+----------+--------------+  PERO      Full                                                             +---------+---------------+---------+-----------+----------+--------------+     Summary: RIGHT: - There is no evidence of deep vein thrombosis in the lower extremity.  - No cystic structure found in the popliteal fossa.  LEFT: - There is no evidence of deep vein thrombosis in the lower extremity.  - No cystic structure found in the popliteal fossa.  *See table(s) above for measurements and observations. Electronically signed by Harold Barban MD on 06/28/2020 at 6:07:07 PM.  Final      Labs:   Basic Metabolic Panel: Recent Labs  Lab 06/27/20 1211 06/27/20 2012 06/27/20 2018 06/28/20 0245 06/29/20 0112 06/30/20 0224 07/01/20 0331  NA 140  --  144 138 135 135 134*  K 3.8  --  3.6 3.6 4.0 3.7 4.6  CL 107  --   --  107 104 104 102  CO2 19*  --   --  21* 22 24 23   GLUCOSE 375*  --   --  296* 164* 250* 154*  BUN 20  --   --  20 35* 41* 36*  CREATININE 1.48*  --   --  1.36* 1.41* 1.72* 1.48*  CALCIUM 8.5*  --   --  8.2* 7.8* 7.9* 8.3*  MG  --  1.4*  --  2.1  --   --   --   PHOS  --   --   --  3.4  --   --   --    GFR Estimated Creatinine Clearance: 23.3 mL/min (A) (by C-G formula based on SCr of 1.48 mg/dL (H)). Liver Function Tests: Recent Labs  Lab 06/27/20 2012 06/28/20 0245 06/29/20 0112 06/30/20 0224 07/01/20 0331  AST 45* 43* 43* 70* 72*  ALT 23 23 21  38 37  ALKPHOS 67 70 65 66 70   BILITOT 1.1 0.9 0.8 0.3 0.9  PROT 6.4* 6.3* 5.8* 5.7* 5.9*  ALBUMIN 2.0* 2.1* 2.0* 1.8* 1.8*   No results for input(s): LIPASE, AMYLASE in the last 168 hours. No results for input(s): AMMONIA in the last 168 hours. Coagulation profile Recent Labs  Lab 06/27/20 2340  INR 1.4*    CBC: Recent Labs  Lab 06/27/20 1211 06/27/20 2018 06/28/20 0245 06/29/20 0112 06/30/20 0224 07/01/20 0331  WBC 5.6  --  4.7 8.1 6.7 6.7  NEUTROABS 4.0  --  3.8 6.6 5.0 3.7  HGB 10.9* 9.5* 10.4* 9.8* 9.8* 11.1*  HCT 35.2* 28.0* 32.3* 30.0* 28.6* 32.3*  MCV 104.1*  --  103.2* 99.7 98.3 98.2  PLT 81*  --  79* 85* 92* 96*   Cardiac Enzymes: Recent Labs  Lab 06/27/20 2012  CKTOTAL 42   BNP: Invalid input(s): POCBNP CBG: Recent Labs  Lab 06/30/20 1534 06/30/20 1918 06/30/20 2329 07/01/20 0332 07/01/20 0731  GLUCAP 329* 319* 200* 151* 136*   D-Dimer No results for input(s): DDIMER in the last 72 hours. Hgb A1c No results for input(s): HGBA1C in the last 72 hours. Lipid Profile No results for input(s): CHOL, HDL, LDLCALC, TRIG, CHOLHDL, LDLDIRECT in the last 72 hours. Thyroid function studies No results for input(s): TSH, T4TOTAL, T3FREE, THYROIDAB in the last 72 hours.  Invalid input(s): FREET3 Anemia work up No results for input(s): VITAMINB12, FOLATE, FERRITIN, TIBC, IRON, RETICCTPCT in the last 72 hours. Microbiology Recent Results (from the past 240 hour(s))  Culture, blood (routine x 2)     Status: None (Preliminary result)   Collection Time: 06/27/20 11:35 AM   Specimen: BLOOD RIGHT FOREARM  Result Value Ref Range Status   Specimen Description BLOOD RIGHT FOREARM  Final   Special Requests   Final    BOTTLES DRAWN AEROBIC AND ANAEROBIC Blood Culture results may not be optimal due to an inadequate volume of blood received in culture bottles   Culture   Final    NO GROWTH 3 DAYS Performed at [facility], [place] [address] [place], [place] [address]    Report Status  PENDING  Incomplete  Culture,  blood (routine x 2)     Status: None (Preliminary result)   Collection Time: 06/27/20 11:40 AM   Specimen: BLOOD LEFT FOREARM  Result Value Ref Range Status   Specimen Description BLOOD LEFT FOREARM  Final   Special Requests   Final    BOTTLES DRAWN AEROBIC AND ANAEROBIC Blood Culture results may not be optimal due to an inadequate volume of blood received in culture bottles   Culture   Final    NO GROWTH 3 DAYS Performed at [facility], [place] [address], [place], [place] [address]    Report Status PENDING  Incomplete  Resp Panel by RT-PCR (Flu A&B, Covid) Nasopharyngeal Swab     Status: None   Collection Time: 06/27/20  3:45 PM   Specimen: Nasopharyngeal Swab; Nasopharyngeal(NP) swabs in vial transport medium  Result Value Ref Range Status   SARS Coronavirus 2 by RT PCR NEGATIVE NEGATIVE Final    Comment: (NOTE) SARS-CoV-2 target nucleic acids are NOT DETECTED.  The SARS-CoV-2 RNA is generally detectable in upper respiratory specimens during the acute phase of infection. The lowest concentration of SARS-CoV-2 viral copies this assay can detect is 138 copies/mL. A negative result does not preclude SARS-Cov-2 infection and should not be used as the sole basis for treatment or other patient management decisions. A negative result may occur with  improper specimen collection/handling, submission of specimen other than nasopharyngeal swab, presence of viral mutation(s) within the areas targeted by this assay, and inadequate number of viral copies(<138 copies/mL). A negative result must be combined with clinical observations, patient history, and epidemiological information. The expected result is Negative.  Fact Sheet for Patients:  EntrepreneurPulse.com.au  Fact Sheet for Healthcare Providers:  IncredibleEmployment.be  This test is no t yet approved or cleared by the [place] FDA and  has been authorized  for detection and/or diagnosis of SARS-CoV-2 by FDA under an Emergency Use Authorization (EUA). This EUA will remain  in effect (meaning this test can be used) for the duration of the COVID-19 declaration under Section 564(b)(1) of the Act, 21 U.S.C.section 360bbb-3(b)(1), unless the authorization is terminated  or revoked sooner.       Influenza A by PCR NEGATIVE NEGATIVE Final   Influenza B by PCR NEGATIVE NEGATIVE Final    Comment: (NOTE) The Xpert Xpress SARS-CoV-2/FLU/RSV plus assay is intended as an aid in the diagnosis of influenza from Nasopharyngeal swab specimens and should not be used as a sole basis for treatment. Nasal washings and aspirates are unacceptable for Xpert Xpress SARS-CoV-2/FLU/RSV testing.  Fact Sheet for Patients: EntrepreneurPulse.com.au  Fact Sheet for Healthcare Providers: IncredibleEmployment.be  This test is not yet approved or cleared by the [place] FDA and has been authorized for detection and/or diagnosis of SARS-CoV-2 by FDA under an Emergency Use Authorization (EUA). This EUA will remain in effect (meaning this test can be used) for the duration of the COVID-19 declaration under Section 564(b)(1) of the Act, 21 U.S.C. section 360bbb-3(b)(1), unless the authorization is terminated or revoked.  Performed at [facility], [place] [address] [place], [place] [address]      Signed: Terrilee Croak  [facility] 07/01/2020, 11:10 AM

## 2020-07-01 NOTE — Progress Notes (Signed)
Completed discharge paperwork with patient and daughter.  Verbalized understanding of all medication schedules and follow up appointments. All personal belongings gathered with and given to daughter.  Patient wheeled to discharge area in wheelchair via tech.

## 2020-07-02 LAB — CULTURE, BLOOD (ROUTINE X 2)
Culture: NO GROWTH
Culture: NO GROWTH

## 2020-07-04 ENCOUNTER — Telehealth: Payer: Self-pay

## 2020-07-04 DIAGNOSIS — J9601 Acute respiratory failure with hypoxia: Secondary | ICD-10-CM

## 2020-07-04 DIAGNOSIS — J849 Interstitial pulmonary disease, unspecified: Secondary | ICD-10-CM

## 2020-07-04 NOTE — Telephone Encounter (Signed)
Transition Care Management Follow-up Telephone Call  Date of discharge and from where: [facility]/2020[facility]  How have you been since you were released from the hospital? (Spoke with daughter, HIPAA verified) Patient is doing okay just some fatigue still noted.   Any questions or concerns? No  Items Reviewed:  Did the pt receive and understand the discharge instructions provided? Yes   Medications obtained and verified? Yes   Other? No   Any new allergies since your discharge? No   Dietary orders reviewed? Yes  Do you have support at home? Yes   Home Care and Equipment/Supplies: Were home health services ordered? yes If so, what is the name of the agency? unknown  Has the agency set up a time to come to the patient's home? yes Were any new equipment or medical supplies ordered?  Yes: oxyen What is the name of the medical supply agency? unknown Were you able to get the supplies/equipment? yes Do you have any questions related to the use of the equipment or supplies? No  Functional Questionnaire: (I = Independent and D = Dependent) ADLs: I  Bathing/Dressing- I  Meal Prep- I  Eating- I  Maintaining continence- I  Transferring/Ambulation- I  Managing Meds- I  Follow up appointments reviewed:   PCP Hospital f/u appt confirmed? No  Daughter did not feel the need to schedule an appointment at this time.  Specialist Hospital f/u appt confirmed? N/A Are transportation arrangements needed? No   If their condition worsens, is the pt aware to call PCP or go to the Emergency Dept.? Yes  Was the patient provided with contact information for the PCP's office or ED? Yes  Was to pt encouraged to call back with questions or concerns? Yes

## 2020-07-04 NOTE — Telephone Encounter (Signed)
Please check in with her tomorrow- see how she is feeling  I think she was ref to pulmonary in the hospital for breathing/lungs-I just want to make sure she gets that appt (and see if they need help with that)  Thanks

## 2020-07-05 ENCOUNTER — Other Ambulatory Visit: Payer: Medicare HMO

## 2020-07-05 DIAGNOSIS — J849 Interstitial pulmonary disease, unspecified: Secondary | ICD-10-CM | POA: Insufficient documentation

## 2020-07-05 NOTE — Telephone Encounter (Signed)
Spoke with daughter and they haven't heard anything from pulmonary so she is asking [place] if we can to help wit that. Daughter said overall pt is stable but she still really fatigued and still coughing a lot

## 2020-07-05 NOTE — Addendum Note (Signed)
Addended by: Roxy Manns A on: 07/05/2020 01:05 PM   Modules accepted: Orders

## 2020-07-05 NOTE — Telephone Encounter (Signed)
I did a referral  They will get a call

## 2020-07-15 ENCOUNTER — Telehealth: Payer: Self-pay | Admitting: Family Medicine

## 2020-07-15 DIAGNOSIS — J849 Interstitial pulmonary disease, unspecified: Secondary | ICD-10-CM | POA: Diagnosis not present

## 2020-07-15 DIAGNOSIS — K7469 Other cirrhosis of liver: Secondary | ICD-10-CM | POA: Diagnosis not present

## 2020-07-15 DIAGNOSIS — E1143 Type 2 diabetes mellitus with diabetic autonomic (poly)neuropathy: Secondary | ICD-10-CM | POA: Diagnosis not present

## 2020-07-15 DIAGNOSIS — I69354 Hemiplegia and hemiparesis following cerebral infarction affecting left non-dominant side: Secondary | ICD-10-CM | POA: Diagnosis not present

## 2020-07-15 DIAGNOSIS — I5031 Acute diastolic (congestive) heart failure: Secondary | ICD-10-CM | POA: Diagnosis not present

## 2020-07-15 DIAGNOSIS — J9601 Acute respiratory failure with hypoxia: Secondary | ICD-10-CM | POA: Diagnosis not present

## 2020-07-15 DIAGNOSIS — N1832 Chronic kidney disease, stage 3b: Secondary | ICD-10-CM | POA: Diagnosis not present

## 2020-07-15 DIAGNOSIS — E1122 Type 2 diabetes mellitus with diabetic chronic kidney disease: Secondary | ICD-10-CM | POA: Diagnosis not present

## 2020-07-15 DIAGNOSIS — I130 Hypertensive heart and chronic kidney disease with heart failure and stage 1 through stage 4 chronic kidney disease, or unspecified chronic kidney disease: Secondary | ICD-10-CM | POA: Diagnosis not present

## 2020-07-15 NOTE — Telephone Encounter (Signed)
Please ok that verbal order Thanks  

## 2020-07-15 NOTE — Telephone Encounter (Signed)
Dr. Glori Bickers - please advise if it would be okay to give this verbal order. Thank you.

## 2020-07-15 NOTE — Telephone Encounter (Signed)
[facility] called verbal orders for PT  2w3 1w5

## 2020-07-18 ENCOUNTER — Other Ambulatory Visit: Payer: Self-pay

## 2020-07-18 ENCOUNTER — Encounter: Payer: Self-pay | Admitting: Family Medicine

## 2020-07-18 ENCOUNTER — Telehealth (INDEPENDENT_AMBULATORY_CARE_PROVIDER_SITE_OTHER): Payer: Medicare HMO | Admitting: Family Medicine

## 2020-07-18 VITALS — BP 130/80 | Wt 150.0 lb

## 2020-07-18 DIAGNOSIS — D469 Myelodysplastic syndrome, unspecified: Secondary | ICD-10-CM | POA: Diagnosis not present

## 2020-07-18 DIAGNOSIS — K7469 Other cirrhosis of liver: Secondary | ICD-10-CM | POA: Diagnosis not present

## 2020-07-18 DIAGNOSIS — R6889 Other general symptoms and signs: Secondary | ICD-10-CM | POA: Diagnosis not present

## 2020-07-18 DIAGNOSIS — E785 Hyperlipidemia, unspecified: Secondary | ICD-10-CM

## 2020-07-18 DIAGNOSIS — I10 Essential (primary) hypertension: Secondary | ICD-10-CM | POA: Diagnosis not present

## 2020-07-18 DIAGNOSIS — J9601 Acute respiratory failure with hypoxia: Secondary | ICD-10-CM

## 2020-07-18 DIAGNOSIS — N1832 Chronic kidney disease, stage 3b: Secondary | ICD-10-CM | POA: Diagnosis not present

## 2020-07-18 DIAGNOSIS — I509 Heart failure, unspecified: Secondary | ICD-10-CM | POA: Diagnosis not present

## 2020-07-18 DIAGNOSIS — J849 Interstitial pulmonary disease, unspecified: Secondary | ICD-10-CM

## 2020-07-18 DIAGNOSIS — R2232 Localized swelling, mass and lump, left upper limb: Secondary | ICD-10-CM

## 2020-07-18 DIAGNOSIS — E1143 Type 2 diabetes mellitus with diabetic autonomic (poly)neuropathy: Secondary | ICD-10-CM

## 2020-07-18 DIAGNOSIS — E1169 Type 2 diabetes mellitus with other specified complication: Secondary | ICD-10-CM

## 2020-07-18 DIAGNOSIS — Z8673 Personal history of transient ischemic attack (TIA), and cerebral infarction without residual deficits: Secondary | ICD-10-CM

## 2020-07-18 NOTE — Assessment & Plan Note (Signed)
Reviewed hospital records, lab results and studies in detail  From both CHF and interstitial lung disease  Resolved

## 2020-07-18 NOTE — Assessment & Plan Note (Signed)
Diuresed in hospital  Now stable  Rev echocardiogram  Continue lasix 20 mg daily  Losartan is on hold

## 2020-07-18 NOTE — Assessment & Plan Note (Signed)
Under care of hematology 

## 2020-07-18 NOTE — Assessment & Plan Note (Signed)
In light of cirrhosis and elevated AST- recommend she hold her rosuvastatin for now

## 2020-07-18 NOTE — Progress Notes (Signed)
Virtual Visit via Telephone Note  I connected with Stacey Harris on 07/18/20 at  9:30 AM EST by telephone and verified that I am speaking with the correct person using two identifiers.  Location: Patient: home Provider: office   I discussed the limitations, risks, security and privacy concerns of performing an evaluation and management service by telephone and the availability of in person appointments. I also discussed with the patient that there may be a patient responsible charge related to this service. The patient expressed understanding and agreed to proceed.  Parties involved in encounter  Patient: (does not give history today- daughter does)  Daughter: Stacey Harris   Provider:  Loura Pardon MD   History of Present Illness: Pt presents for hospital f/u hosp from 12/20 to 12/24 for acute resp failure with hypoxia  She presented to ER on 12/20 with sob wt gain/pedal edema  Was hypoxic in 80s and lactic acid was elevated  She was started on IV levaquin and solumetrol  CT scan showed prominent pulm interstitial markings and edema  Also portal hypertension/splenomegaly and esoph varices   hosp course: Assessment/Plan: Acute respiratory failure with hypoxia Acute exacerbation of diastolic congestive heart failure Essential hypertension -Diuresed with IV Lasix. -12/21, echo with EF 60 to 67%, grade 2 diastolic dysfunction, -Net IO Since Admission: -1,982 mL [07/01/20 1110].  -Home meds include Lasix 20 mg daily, losartan 25 mg daily, -Continue Lasix at home.  Keep losartan on hold.  Nadolol has been started because of cirrhosis. -Continue monitoring blood pressure at home.  She tested neg for covid and flu and blood cultures were negative    Chronic interstitial infiltrates -Patient never smoked but had exposure to her husband's smoking 40 years ago.  She does not have an established diagnosis of COPD.  However on review of her chest x-rays since 2010, he has chronic  interstitial markings which are progressively worsening.  Prior to admission, patient was not requiring supplemental oxygen at home but lately she gets winded even with minimal exertion. -Imagings on admission was negative for any acute infiltrate.   -I believe she has slowly progressive underlying interstitial lung disease for which she needs to follow-up with pulmonology as an outpatient.  Outpatient referral given.  Her primary symptom is cough for which I have started her on Mucinex. -She is also on a 5-day course of prednisone. Last Labs              Recent Labs  Lab 06/27/20 1211 06/27/20 1345 06/27/20 2012 06/27/20 2340 06/28/20 0245 06/28/20 1439 06/29/20 0112 06/30/20 0224 07/01/20 0331  WBC 5.6  --   --   --  4.7  --  8.1 6.7 6.7  LATICACIDVEN 4.0* 2.8*  --  1.7  --  2.5*  --   --   --   PROCALCITON  --   --  0.10  --  <0.10 0.13 0.12 0.11  --     Pt has appt with Dr Vaughan Browner in pulmonary on 1/25      AKI on CKD stage IIIb -Baseline creatinine less than 1.5.  With IV Lasix, creatinine worsened up to 1.72.  Improved after IV Lasix was held.  Creatinine at baseline today. -Oral Lasix to continue at home. -Nephrology follow-up as an outpatient. Recent Labs (within last 365 days)             Recent Labs    07/31/19 1214 04/05/20 1152 04/08/20 1022 06/10/20 1317 06/27/20 1211 06/28/20 0245 06/29/20 0112  06/30/20 0224 07/01/20 0331  BUN 21 24* 25* 27* 20 20 35* 41* 36*  CREATININE 1.28* 1.44* 1.48* 1.56* 1.48* 1.36* 1.41* 1.72* 1.48*     Liver cirrhosis -Per ultrasound, patient has liver cirrhosis with portal hypertension, splenomegaly and esophageal varices. -Liver enzymes stable.  Hepatitis panel negative. -GI consult appreciated.  Recommended nadolol 10 mg daily for prophylaxis of esophageal varices.  To hold if heart rate less than 60 bpm. -Patient to follow-up in GI clinic in 6 to 8 weeks.  Type 2 diabetes mellitus -A1c 7.9 on 12/20. -Home meds include  glimepiride 4 mg daily.  Resume the same at home.  Myelodysplasia with chronic thrombocytopenia and iron deficiency anemia -Prior to admission, patient was on aspirin.  Daughter states she kept that on hold because of easy bruisability.  I would stop aspirin completely. Recent Labs (within last 365 days)    CT report CT Angio Chest PE W/Cm &/Or Wo Cm  Result Date: 06/27/2020 CLINICAL DATA:  PE suspected. EXAM: CT ANGIOGRAPHY CHEST WITH CONTRAST TECHNIQUE: Multidetector CT imaging of the chest was performed using the standard protocol during bolus administration of intravenous contrast. Multiplanar CT image reconstructions and MIPs were obtained to evaluate the vascular anatomy. CONTRAST:  24mL OMNIPAQUE IOHEXOL 350 MG/ML SOLN COMPARISON:  None. FINDINGS: Cardiovascular: Evaluation is limited by respiratory motion artifact. Within that limitation, there is no central pulmonary embolus. The size of the main pulmonary artery is normal. Cardiomegaly with coronary artery calcification. The course and caliber of the aorta are normal. There is mild atherosclerotic calcification. Opacification decreased due to pulmonary arterial phase contrast bolus timing. Mediastinum/Nodes: --mild mediastinal adenopathy is noted. --hilar adenopathy is noted. -- No axillary lymphadenopathy. --there is mild left supraclavicular adenopathy. -- Normal thyroid gland where visualized. -  Unremarkable esophagus. Lungs/Pleura: Prominent interstitial lung markings and reticular airspace opacities are noted. There are areas of ground-glass opacification in the bilateral upper lobes, right worse than left. There is bronchial wall thickening and mucus plugging bilaterally. There is no pneumothorax. There is no large pleural effusion. Upper Abdomen: Contrast bolus timing is not optimized for evaluation of the abdominal organs. There is recanalization of the umbilical vein in addition to splenomegaly, both consistent with cirrhosis with  stigmata of portal hypertension. There is a large hiatal hernia with multiple esophageal varices. Musculoskeletal: No chest wall abnormality. No bony spinal canal stenosis. Review of the MIP images confirms the above findings. IMPRESSION: 1. Evaluation is limited by respiratory motion artifact. Within that limitation, there is no central pulmonary embolus. 2. Prominent interstitial lung markings and reticular airspace opacities, with areas of ground-glass opacification in the bilateral upper lobes, right worse than left. Findings are nonspecific but can be seen in patients with interstitial lung disease or pulmonary edema. A superimposed infectious process is not excluded. 3. Bronchial wall thickening and mucus plugging bilaterally. Findings can be seen in patients with reactive or infectious bronchiolitis. 4. Cardiomegaly with coronary artery disease. 5. Cirrhosis with stigmata of portal hypertension including splenomegaly and esophageal varices. 6. Large hiatal. Aortic Atherosclerosis (ICD10-I70.0). Electronically Signed   By: Constance Holster M.D.   On: 06/27/2020 16:44    Since she got home- did well initially  A week ago family thinks she had another mini stroke -more trouble using L leg and arm  That is improved now  Blood sugar went up and now back down (was up to 276 one am)   Very weak overall  Breathing is much better since being on the 02 -  really helpful   Pt has home health and PT was ordered    Her L hand swelled up (had a lot of blood draws-thought from that -was bruised)  It went down after 2 days  Now swelled up again  Not red  No trauma  Michela Pitcher it was not hurting     Weight  Wt Readings from Last 3 Encounters:  07/18/20 150 lb (68 kg)  06/30/20 150 lb 2.1 oz (68.1 kg)  06/10/20 154 lb 4 oz (70 kg)     Vital signs  BP Readings from Last 3 Encounters:  07/18/20 130/80  07/01/20 (!) 114/54  04/29/20 (!) 142/70   Holding losartan  Taking nadolol 10 mg daily     Lab Results  Component Value Date   CREATININE 1.48 (H) 07/01/2020   BUN 36 (H) 07/01/2020   NA 134 (L) 07/01/2020   K 4.6 07/01/2020   CL 102 07/01/2020   CO2 23 07/01/2020   Lab Results  Component Value Date   ALT 37 07/01/2020   AST 72 (H) 07/01/2020   ALKPHOS 70 07/01/2020   BILITOT 0.9 07/01/2020   has appt with GI   MDS Lab Results  Component Value Date   WBC 6.7 07/01/2020   HGB 11.1 (L) 07/01/2020   HCT 32.3 (L) 07/01/2020   MCV 98.2 07/01/2020   PLT 96 (L) 07/01/2020   Patient Active Problem List   Diagnosis Date Noted  . Localized swelling on left hand 07/18/2020  . Impaired quality of life 07/18/2020  . Interstitial lung disease ([place]) 07/05/2020  . Abnormal CT scan of lung 06/27/2020  . Elevated troponin 06/27/2020  . Elevated lactic acid level 06/27/2020  . Other cirrhosis of liver ([place]) 06/27/2020  . Abnormal chest x-ray 06/10/2020  . Acute cystitis 05/25/2020  . Cough 05/25/2020  . CHF (congestive heart failure) ([place]) 04/12/2020  . Iron deficiency 04/29/2019  . Carpal tunnel syndrome of left wrist   . Myelodysplastic syndrome ([place])   . History of TIA (transient ischemic attack)   . Hypomagnesemia   . CKD (chronic kidney disease) 03/30/2019  . Recurrent UTI (urinary tract infection) 09/17/2018  . Fall at home 09/17/2018  . Frequent urination 04/29/2018  . Elevated serum creatinine 04/11/2018  . Dysuria 11/12/2017  . Dyspnea on exertion 11/12/2017  . History of fall 08/27/2016  . Controlled type 2 diabetes mellitus with diabetic autonomic neuropathy ([place]) 02/24/2016  . Routine general medical examination at a health care facility 02/12/2016  . MDS (myelodysplastic syndrome) ([place]) 10/24/2015  . Normocytic anemia 02/11/2015  . GERD (gastroesophageal reflux disease) 08/06/2014  . Encounter for Medicare annual wellness exam 08/03/2013  . Thrombocytopenia ([place]) 08/03/2013  . Insomnia 01/26/2013  . Hypothyroid 09/17/2011  . Lumbar spinal  stenosis 06/21/2011    Class: Present on Admission  . BACK PAIN, LUMBAR 08/23/2010  . POSTNASAL DRIP 08/23/2010  . OSTEOARTHRITIS, HIP 03/16/2009  . BASAL CELL CARCINOMA, FACE 07/01/2007  . BASAL CELL CARCINOMA, FACE 07/01/2007  . DIABETIC PERIPHERAL NEUROPATHY 09/24/2006  . Hyperlipidemia associated with type 2 diabetes mellitus ([place]) 09/24/2006  . Essential hypertension 09/24/2006  . ROSACEA 09/24/2006  . INCONTINENCE, URGE 09/24/2006  . COLONOSCOPY AND REMOVAL OF LESION, HX OF 06/09/2003   Past Medical History:  Diagnosis Date  . Blood transfusion   . Cancer ([place])    basal cell skin CA  . Diabetes mellitus    type II  . Dyspnea   . Frequent UTI   . Hyperlipidemia   .  Hypertension   . Hypothyroidism   . Macular degeneration of both eyes   . OA (osteoarthritis)   . Stroke [facility]) 03/30/2019   Past Surgical History:  Procedure Laterality Date  . ANKLE FRACTURE SURGERY  10   rt  . CARPAL TUNNEL RELEASE Right 06/06/2017   Procedure: RIGHT CARPAL TUNNEL RELEASE;  Surgeon: Daryll Brod, MD;  Location: [place];  Service: Orthopedics;  Laterality: Right;  . CATARACT EXTRACTION W/ INTRAOCULAR LENS  IMPLANT, BILATERAL Bilateral 2019  . JOINT REPLACEMENT Right 2010   rt total hip 10  . LUMBAR LAMINECTOMY/DECOMPRESSION MICRODISCECTOMY  06/22/2011   Procedure: LUMBAR LAMINECTOMY/DECOMPRESSION MICRODISCECTOMY;  Surgeon: Marybelle Killings;  Location: [place];  Service: Orthopedics;  Laterality: N/A;  L3-4, L4-5 Decompression  . MOHS SURGERY  12/08   basal cell skin cancer lt temple  . TAH      and BSO, age 41  . TONSILLECTOMY     Social History   Tobacco Use  . Smoking status: Never Smoker  . Smokeless tobacco: Never Used  Vaping Use  . Vaping Use: Never used  Substance Use Topics  . Alcohol use: No    Alcohol/week: 0.0 standard drinks  . Drug use: No   Family History  Problem Relation Age of Onset  . Arthritis Father        RA  . Hypertension Father   .  Heart disease Father        CAD  . Diabetes Father   . Arthritis Sister        RA  . Diabetes Brother   . Cancer Brother   . Diabetes Brother   . Cancer Brother   . Diabetes Brother   . Cancer Brother   . Diabetes Sister   . Cancer Brother    Allergies  Allergen Reactions  . Keflex [Cephalexin]     Did ok with lower dose, higher dose caused a rash    . Mavik [Trandolapril]     Cough    Current Outpatient Medications on File Prior to Visit  Medication Sig Dispense Refill  . acetaminophen (TYLENOL) 325 MG tablet Take 1-2 tablets (325-650 mg total) by mouth every 4 (four) hours as needed for mild pain.    [name] albuterol (VENTOLIN HFA) 108 (90 Base) MCG/ACT inhaler Inhale 2 puffs into the lungs every 4 (four) hours as needed for wheezing (cough). 1 each 3  . Cholecalciferol (VITAMIN D PO) Take 5,000 Units by mouth daily.    [name] CRANBERRY PO Take 2 tablets by mouth daily.     [name] dextromethorphan (DELSYM) 30 MG/5ML liquid Take 60 mg by mouth at bedtime as needed for cough. 31ml    . furosemide (LASIX) 20 MG tablet TAKE 1 TABLET BY MOUTH EVERY DAY (Patient taking differently: Take 20 mg by mouth daily.) 30 tablet 2  . glimepiride (AMARYL) 4 MG tablet TAKE 1 TABLET BY MOUTH EVERY DAY BEFORE BREAKFAST (Patient taking differently: Take 4 mg by mouth daily with breakfast.) 90 tablet 3  . guaiFENesin (MUCINEX) 600 MG 12 hr tablet Take 1 tablet (600 mg total) by mouth 2 (two) times daily. 60 tablet 2  . levothyroxine (SYNTHROID) 50 MCG tablet Take 1 tablet (50 mcg total) by mouth daily. 90 tablet 3  . Multiple Vitamin (MULTIVITAMIN) capsule Take 1 capsule by mouth daily.    . nadolol (CORGARD) 20 MG tablet Take 0.5 tablets (10 mg total) by mouth daily. 15 tablet 2  . Omega-3 Fatty Acids (FISH  OIL) 1200 MG CAPS Take 1,200 mg by mouth daily.    [name] omeprazole (PRILOSEC) 40 MG capsule Take 1 capsule (40 mg total) by mouth daily. 90 capsule 1  . TRUE METRIX BLOOD GLUCOSE TEST test strip CHECK BLOOD  SUGAR ONCE DAILY AND AS DIRECTED.DX E11.43 100 strip 1  . TRUEplus Lancets 30G MISC 1 EACH BY SUBDERMAL ROUTE DAILY. CHECK BLOOD SUGAR ONCE DAILY AND AS DIRECTED.DX E11.43 100 each 1   No current facility-administered medications on file prior to visit.     Observations/Objective: Discussed care with pt's daughter today    Assessment and Plan: Problem List Items Addressed This Visit      Cardiovascular and Mediastinum   Essential hypertension    Losartan was held and nadolol started during last hosp with finding of liver cirrhosis and esoph varaces BP Readings from Last 3 Encounters:  07/18/20 130/80  07/01/20 (!) 114/54  04/29/20 (!) 142/70   Stable  Will continue to hold losartan for now       CHF (congestive heart failure) (HCC)    Diuresed in hospital  Now stable  Rev echocardiogram  Continue lasix 20 mg daily  Losartan is on hold          Respiratory   Interstitial lung disease ([place])    Doing better at home with oxygen appt upcoming with pulmonary 1/25 Not coughing a lot        RESOLVED: Acute respiratory failure with hypoxia [facility])    Reviewed hospital records, lab results and studies in detail  From both CHF and interstitial lung disease  Resolved         Digestive   Other cirrhosis of liver ([place])    New diagnosis in hospital (by CT scan)  Reviewed hospital records, lab results and studies in detail   AST of 72 No symptoms from this besides fatigue  No h/o known liver dz or problems prior  She has esoph varices and nadolol was started  GI appt is scheduled for early next mo        Endocrine   Hyperlipidemia associated with type 2 diabetes mellitus ([place])    In light of cirrhosis and elevated AST- recommend she hold her rosuvastatin for now       Controlled type 2 diabetes mellitus with diabetic autonomic neuropathy ([place])    Lab Results  Component Value Date   HGBA1C 7.9 (H) 06/27/2020   Per family-this shot up when they thought she had a  recent mini stroke  Glucose is back to baseline          Genitourinary   CKD (chronic kidney disease)    Recently hospitalized  Last cr 1.48  Need to re check this- is homebound and would like to see if home care could (also considering palliative consult)         Other   MDS (myelodysplastic syndrome) ([place])    Under care of hematology        History of TIA (transient ischemic attack)    Family thinks pt has had a tia since coming home from the hospital with L sided weakness (her PT is aware)  Now improved  Disc pros/cons and risks of resuming asa 81 mg daily (with esoph varices)  Decided to resume asa       Localized swelling on left hand    Per pt's daughter on and off since d/c from hospital recently  Not painful  Did originally have bruise and susp trauma  from multiple blood draws in hospital No erythema  Not swelling in other hand (she does have cirrhosis)  Will watch  Offered ref for UE doppler- declined for now /more interested in comfort currently      Impaired quality of life - Primary    With multiple organ system problems -incl hepatic cirrhosis, CHF, ILD, and MDS She is home from hospital/ very weak  Family thinks a palliative care consult to discuss goals would be helpful  Has home care for PT now  Referral done for palliative care  Cannot get her to the office for this or labs            Follow Up Instructions: Referral done for palliative care to discuss goals for quality of life  I would also like to see if we can get labs drawn at home Follow up with pulmonary and GI as planned Continue PT as tolerated Please alert me if any new symptoms  If hand swelling worsens or fails to improve please let us know  Elevate it when able    I discussed the assessment and treatment plan with the patient. The patient was provided an opportunity to ask questions and all were answered. The patient agreed with the plan and demonstrated an understanding of the  instructions.   The patient was advised to call back or seek an in-person evaluation if the symptoms worsen or if the condition fails to improve as anticipated.  I provided 30 minutes of non-face-to-face time during this encounter.   Loura Pardon, MD

## 2020-07-18 NOTE — Assessment & Plan Note (Signed)
Per pt's daughter on and off since d/c from hospital recently  Not painful  Did originally have bruise and susp trauma from multiple blood draws in hospital No erythema  Not swelling in other hand (she does have cirrhosis)  Will watch  Offered ref for UE doppler- declined for now /more interested in comfort currently

## 2020-07-18 NOTE — Patient Instructions (Signed)
Referral done for palliative care to discuss goals for quality of life  I would also like to see if we can get labs drawn at home Follow up with pulmonary and GI as planned Continue PT as tolerated Please alert me if any new symptoms  If hand swelling worsens or fails to improve please let us know  Elevate it when able

## 2020-07-18 NOTE — Telephone Encounter (Signed)
Verbal order given to Kelly 

## 2020-07-18 NOTE — Assessment & Plan Note (Signed)
With multiple organ system problems -incl hepatic cirrhosis, CHF, ILD, and MDS She is home from hospital/ very weak  Family thinks a palliative care consult to discuss goals would be helpful  Has home care for PT now  Referral done for palliative care  Cannot get her to the office for this or labs

## 2020-07-18 NOTE — Assessment & Plan Note (Signed)
Losartan was held and nadolol started during last hosp with finding of liver cirrhosis and esoph varaces BP Readings from Last 3 Encounters:  07/18/20 130/80  07/01/20 (!) 114/54  04/29/20 (!) 142/70   Stable  Will continue to hold losartan for now

## 2020-07-18 NOTE — Assessment & Plan Note (Signed)
Doing better at home with oxygen appt upcoming with pulmonary 1/25 Not coughing a lot

## 2020-07-18 NOTE — Assessment & Plan Note (Signed)
Family thinks pt has had a tia since coming home from the hospital with L sided weakness (her PT is aware)  Now improved  Disc pros/cons and risks of resuming asa 81 mg daily (with esoph varices)  Decided to resume asa

## 2020-07-18 NOTE — Assessment & Plan Note (Signed)
Recently hospitalized  Last cr 1.48  Need to re check this- is homebound and would like to see if home care could (also considering palliative consult)

## 2020-07-18 NOTE — Assessment & Plan Note (Signed)
Lab Results  Component Value Date   HGBA1C 7.9 (H) 06/27/2020   Per family-this shot up when they thought she had a recent mini stroke  Glucose is back to baseline

## 2020-07-18 NOTE — Assessment & Plan Note (Addendum)
New diagnosis in hospital (by CT scan)  Reviewed hospital records, lab results and studies in detail   AST of 72 No symptoms from this besides fatigue  No h/o known liver dz or problems prior  She has esoph varices and nadolol was started  GI appt is scheduled for early next mo

## 2020-07-19 DIAGNOSIS — E1122 Type 2 diabetes mellitus with diabetic chronic kidney disease: Secondary | ICD-10-CM | POA: Diagnosis not present

## 2020-07-19 DIAGNOSIS — J9601 Acute respiratory failure with hypoxia: Secondary | ICD-10-CM | POA: Diagnosis not present

## 2020-07-19 DIAGNOSIS — K7469 Other cirrhosis of liver: Secondary | ICD-10-CM | POA: Diagnosis not present

## 2020-07-19 DIAGNOSIS — I69354 Hemiplegia and hemiparesis following cerebral infarction affecting left non-dominant side: Secondary | ICD-10-CM | POA: Diagnosis not present

## 2020-07-19 DIAGNOSIS — I5031 Acute diastolic (congestive) heart failure: Secondary | ICD-10-CM | POA: Diagnosis not present

## 2020-07-19 DIAGNOSIS — E1143 Type 2 diabetes mellitus with diabetic autonomic (poly)neuropathy: Secondary | ICD-10-CM | POA: Diagnosis not present

## 2020-07-19 DIAGNOSIS — I130 Hypertensive heart and chronic kidney disease with heart failure and stage 1 through stage 4 chronic kidney disease, or unspecified chronic kidney disease: Secondary | ICD-10-CM | POA: Diagnosis not present

## 2020-07-19 DIAGNOSIS — J849 Interstitial pulmonary disease, unspecified: Secondary | ICD-10-CM | POA: Diagnosis not present

## 2020-07-19 DIAGNOSIS — N1832 Chronic kidney disease, stage 3b: Secondary | ICD-10-CM | POA: Diagnosis not present

## 2020-07-20 ENCOUNTER — Telehealth: Payer: Self-pay

## 2020-07-20 NOTE — Telephone Encounter (Signed)
Spoke with patient's daughter [name] and scheduled an in-person Palliative Consult for 08/08/20 @ 1PM  COVID screening was negative. No pets in home. Patient lives with daughter [name].   Consent obtained; updated Outlook/Netsmart/Team List and Epic.  Family is aware they will be receiving a call from NP the day before or day of to confirm appointment.

## 2020-07-21 DIAGNOSIS — J849 Interstitial pulmonary disease, unspecified: Secondary | ICD-10-CM | POA: Diagnosis not present

## 2020-07-21 DIAGNOSIS — E1122 Type 2 diabetes mellitus with diabetic chronic kidney disease: Secondary | ICD-10-CM | POA: Diagnosis not present

## 2020-07-21 DIAGNOSIS — I5031 Acute diastolic (congestive) heart failure: Secondary | ICD-10-CM | POA: Diagnosis not present

## 2020-07-21 DIAGNOSIS — K7469 Other cirrhosis of liver: Secondary | ICD-10-CM | POA: Diagnosis not present

## 2020-07-21 DIAGNOSIS — J9601 Acute respiratory failure with hypoxia: Secondary | ICD-10-CM | POA: Diagnosis not present

## 2020-07-21 DIAGNOSIS — I69354 Hemiplegia and hemiparesis following cerebral infarction affecting left non-dominant side: Secondary | ICD-10-CM | POA: Diagnosis not present

## 2020-07-21 DIAGNOSIS — E1143 Type 2 diabetes mellitus with diabetic autonomic (poly)neuropathy: Secondary | ICD-10-CM | POA: Diagnosis not present

## 2020-07-21 DIAGNOSIS — N1832 Chronic kidney disease, stage 3b: Secondary | ICD-10-CM | POA: Diagnosis not present

## 2020-07-21 DIAGNOSIS — I130 Hypertensive heart and chronic kidney disease with heart failure and stage 1 through stage 4 chronic kidney disease, or unspecified chronic kidney disease: Secondary | ICD-10-CM | POA: Diagnosis not present

## 2020-07-22 ENCOUNTER — Telehealth: Payer: Self-pay

## 2020-07-22 NOTE — Telephone Encounter (Signed)
Verbal order given.  Also Dr. Glori Bickers sent me a message saying:  I ordered a palliative care consult for home if possible  Pt needs labs done and family cannot bring her right now  I think she has home PT currently (post hospital) - unsure if a palliative care team would draw labs as well   Colletta Maryland said she is able to take a lab order verbally and what they do is set up a one time nursing eval and the nurse will draw labs then. Colletta Maryland just needs to know what labs/ diagnosis code

## 2020-07-22 NOTE — Telephone Encounter (Signed)
Please verbally ok that order  

## 2020-07-22 NOTE — Telephone Encounter (Signed)
Stacey Harris w/ [facility] requesting orders for home health OT to evaluate and treat

## 2020-07-22 NOTE — Telephone Encounter (Signed)
Thanks so much! BMET and liver function tests and cbc with diff  Dz I10 K74.69 N18.9 D64.9

## 2020-07-22 NOTE — Telephone Encounter (Signed)
Left VM giving Colletta Maryland the verbal order to get labs drawn

## 2020-08-01 DIAGNOSIS — I130 Hypertensive heart and chronic kidney disease with heart failure and stage 1 through stage 4 chronic kidney disease, or unspecified chronic kidney disease: Secondary | ICD-10-CM | POA: Diagnosis not present

## 2020-08-01 DIAGNOSIS — E1143 Type 2 diabetes mellitus with diabetic autonomic (poly)neuropathy: Secondary | ICD-10-CM | POA: Diagnosis not present

## 2020-08-01 DIAGNOSIS — N1832 Chronic kidney disease, stage 3b: Secondary | ICD-10-CM | POA: Diagnosis not present

## 2020-08-01 DIAGNOSIS — I69354 Hemiplegia and hemiparesis following cerebral infarction affecting left non-dominant side: Secondary | ICD-10-CM | POA: Diagnosis not present

## 2020-08-01 DIAGNOSIS — I5031 Acute diastolic (congestive) heart failure: Secondary | ICD-10-CM | POA: Diagnosis not present

## 2020-08-01 DIAGNOSIS — K7469 Other cirrhosis of liver: Secondary | ICD-10-CM | POA: Diagnosis not present

## 2020-08-01 DIAGNOSIS — E1122 Type 2 diabetes mellitus with diabetic chronic kidney disease: Secondary | ICD-10-CM | POA: Diagnosis not present

## 2020-08-01 DIAGNOSIS — J849 Interstitial pulmonary disease, unspecified: Secondary | ICD-10-CM | POA: Diagnosis not present

## 2020-08-01 DIAGNOSIS — J9601 Acute respiratory failure with hypoxia: Secondary | ICD-10-CM | POA: Diagnosis not present

## 2020-08-02 ENCOUNTER — Encounter: Payer: Self-pay | Admitting: Pulmonary Disease

## 2020-08-02 ENCOUNTER — Ambulatory Visit: Payer: Medicare HMO | Admitting: Pulmonary Disease

## 2020-08-02 ENCOUNTER — Other Ambulatory Visit: Payer: Self-pay

## 2020-08-02 VITALS — BP 130/74 | HR 90 | Temp 97.3°F | Ht 63.0 in | Wt 152.4 lb

## 2020-08-02 DIAGNOSIS — J849 Interstitial pulmonary disease, unspecified: Secondary | ICD-10-CM
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
## 2020-08-02 NOTE — Patient Instructions (Signed)
We will schedule pulmonary function tests, high-res CT ILD questionnaire and labs for better evaluation of the inflammation in the lung  Follow-up in 1 to 2 months.

## 2020-08-03 DIAGNOSIS — E1143 Type 2 diabetes mellitus with diabetic autonomic (poly)neuropathy: Secondary | ICD-10-CM | POA: Diagnosis not present

## 2020-08-03 DIAGNOSIS — K7469 Other cirrhosis of liver: Secondary | ICD-10-CM | POA: Diagnosis not present

## 2020-08-03 DIAGNOSIS — I5031 Acute diastolic (congestive) heart failure: Secondary | ICD-10-CM | POA: Diagnosis not present

## 2020-08-03 DIAGNOSIS — E1122 Type 2 diabetes mellitus with diabetic chronic kidney disease: Secondary | ICD-10-CM | POA: Diagnosis not present

## 2020-08-03 DIAGNOSIS — I130 Hypertensive heart and chronic kidney disease with heart failure and stage 1 through stage 4 chronic kidney disease, or unspecified chronic kidney disease: Secondary | ICD-10-CM | POA: Diagnosis not present

## 2020-08-03 DIAGNOSIS — J9601 Acute respiratory failure with hypoxia: Secondary | ICD-10-CM | POA: Diagnosis not present

## 2020-08-03 DIAGNOSIS — N1832 Chronic kidney disease, stage 3b: Secondary | ICD-10-CM | POA: Diagnosis not present

## 2020-08-03 DIAGNOSIS — J849 Interstitial pulmonary disease, unspecified: Secondary | ICD-10-CM | POA: Diagnosis not present

## 2020-08-03 DIAGNOSIS — I69354 Hemiplegia and hemiparesis following cerebral infarction affecting left non-dominant side: Secondary | ICD-10-CM | POA: Diagnosis not present

## 2020-08-04 DIAGNOSIS — E1122 Type 2 diabetes mellitus with diabetic chronic kidney disease: Secondary | ICD-10-CM | POA: Diagnosis not present

## 2020-08-04 DIAGNOSIS — I5031 Acute diastolic (congestive) heart failure: Secondary | ICD-10-CM | POA: Diagnosis not present

## 2020-08-04 DIAGNOSIS — J849 Interstitial pulmonary disease, unspecified: Secondary | ICD-10-CM | POA: Diagnosis not present

## 2020-08-04 DIAGNOSIS — J9601 Acute respiratory failure with hypoxia: Secondary | ICD-10-CM | POA: Diagnosis not present

## 2020-08-04 DIAGNOSIS — N1832 Chronic kidney disease, stage 3b: Secondary | ICD-10-CM | POA: Diagnosis not present

## 2020-08-04 DIAGNOSIS — I69354 Hemiplegia and hemiparesis following cerebral infarction affecting left non-dominant side: Secondary | ICD-10-CM | POA: Diagnosis not present

## 2020-08-04 DIAGNOSIS — I130 Hypertensive heart and chronic kidney disease with heart failure and stage 1 through stage 4 chronic kidney disease, or unspecified chronic kidney disease: Secondary | ICD-10-CM | POA: Diagnosis not present

## 2020-08-04 DIAGNOSIS — K7469 Other cirrhosis of liver: Secondary | ICD-10-CM | POA: Diagnosis not present

## 2020-08-04 DIAGNOSIS — E1143 Type 2 diabetes mellitus with diabetic autonomic (poly)neuropathy: Secondary | ICD-10-CM | POA: Diagnosis not present

## 2020-08-04 LAB — SJOGREN'S SYNDROME ANTIBODS(SSA + SSB)
SSA (Ro) (ENA) Antibody, IgG: <1 AI
SSB (La) (ENA) Antibody, IgG: <1 AI

## 2020-08-04 LAB — ANA,IFA RA DIAG PNL W/RFLX TIT/PATN
Anti Nuclear Antibody (ANA): NEGATIVE
Cyclic Citrullin Peptide Ab: <16 UNITS
Rheumatoid fact SerPl-aCnc: <14 IU/mL (ref <14)

## 2020-08-04 LAB — ANTI-SCLERODERMA ANTIBODY: Scleroderma (Scl-70) (ENA) Antibody, IgG: <1 AI

## 2020-08-04 LAB — ANCA SCREEN W REFLEX TITER: ANCA Screen: NEGATIVE

## 2020-08-05 LAB — HYPERSENSITIVITY PNEUMONITIS
A. Pullulans Abs: NEGATIVE
A.Fumigatus #1 Abs: NEGATIVE
Micropolyspora faeni, IgG: NEGATIVE
Pigeon Serum Abs: NEGATIVE
Thermoact. Saccharii: NEGATIVE
Thermoactinomyces vulgaris, IgG: NEGATIVE

## 2020-08-08 ENCOUNTER — Other Ambulatory Visit: Payer: Self-pay | Admitting: Hospice

## 2020-08-08 DIAGNOSIS — I5031 Acute diastolic (congestive) heart failure: Secondary | ICD-10-CM | POA: Diagnosis not present

## 2020-08-08 DIAGNOSIS — I69354 Hemiplegia and hemiparesis following cerebral infarction affecting left non-dominant side: Secondary | ICD-10-CM | POA: Diagnosis not present

## 2020-08-08 DIAGNOSIS — K7469 Other cirrhosis of liver: Secondary | ICD-10-CM | POA: Diagnosis not present

## 2020-08-08 DIAGNOSIS — J849 Interstitial pulmonary disease, unspecified: Secondary | ICD-10-CM | POA: Diagnosis not present

## 2020-08-08 DIAGNOSIS — I130 Hypertensive heart and chronic kidney disease with heart failure and stage 1 through stage 4 chronic kidney disease, or unspecified chronic kidney disease: Secondary | ICD-10-CM | POA: Diagnosis not present

## 2020-08-08 DIAGNOSIS — E1143 Type 2 diabetes mellitus with diabetic autonomic (poly)neuropathy: Secondary | ICD-10-CM | POA: Diagnosis not present

## 2020-08-08 DIAGNOSIS — E1122 Type 2 diabetes mellitus with diabetic chronic kidney disease: Secondary | ICD-10-CM | POA: Diagnosis not present

## 2020-08-08 DIAGNOSIS — J9601 Acute respiratory failure with hypoxia: Secondary | ICD-10-CM | POA: Diagnosis not present

## 2020-08-08 DIAGNOSIS — N1832 Chronic kidney disease, stage 3b: Secondary | ICD-10-CM | POA: Diagnosis not present

## 2020-08-11 DIAGNOSIS — N1832 Chronic kidney disease, stage 3b: Secondary | ICD-10-CM | POA: Diagnosis not present

## 2020-08-11 DIAGNOSIS — J849 Interstitial pulmonary disease, unspecified: Secondary | ICD-10-CM | POA: Diagnosis not present

## 2020-08-11 DIAGNOSIS — E1143 Type 2 diabetes mellitus with diabetic autonomic (poly)neuropathy: Secondary | ICD-10-CM | POA: Diagnosis not present

## 2020-08-11 DIAGNOSIS — J9601 Acute respiratory failure with hypoxia: Secondary | ICD-10-CM | POA: Diagnosis not present

## 2020-08-11 DIAGNOSIS — I5031 Acute diastolic (congestive) heart failure: Secondary | ICD-10-CM | POA: Diagnosis not present

## 2020-08-11 DIAGNOSIS — K7469 Other cirrhosis of liver: Secondary | ICD-10-CM | POA: Diagnosis not present

## 2020-08-11 DIAGNOSIS — E1122 Type 2 diabetes mellitus with diabetic chronic kidney disease: Secondary | ICD-10-CM | POA: Diagnosis not present

## 2020-08-11 DIAGNOSIS — I69354 Hemiplegia and hemiparesis following cerebral infarction affecting left non-dominant side: Secondary | ICD-10-CM | POA: Diagnosis not present

## 2020-08-11 DIAGNOSIS — I130 Hypertensive heart and chronic kidney disease with heart failure and stage 1 through stage 4 chronic kidney disease, or unspecified chronic kidney disease: Secondary | ICD-10-CM | POA: Diagnosis not present

## 2020-08-14 DIAGNOSIS — K7469 Other cirrhosis of liver: Secondary | ICD-10-CM | POA: Diagnosis not present

## 2020-08-14 DIAGNOSIS — J849 Interstitial pulmonary disease, unspecified: Secondary | ICD-10-CM | POA: Diagnosis not present

## 2020-08-14 DIAGNOSIS — J9601 Acute respiratory failure with hypoxia: Secondary | ICD-10-CM | POA: Diagnosis not present

## 2020-08-14 DIAGNOSIS — I69354 Hemiplegia and hemiparesis following cerebral infarction affecting left non-dominant side: Secondary | ICD-10-CM | POA: Diagnosis not present

## 2020-08-14 DIAGNOSIS — I5031 Acute diastolic (congestive) heart failure: Secondary | ICD-10-CM | POA: Diagnosis not present

## 2020-08-14 DIAGNOSIS — I130 Hypertensive heart and chronic kidney disease with heart failure and stage 1 through stage 4 chronic kidney disease, or unspecified chronic kidney disease: Secondary | ICD-10-CM | POA: Diagnosis not present

## 2020-08-14 DIAGNOSIS — E1122 Type 2 diabetes mellitus with diabetic chronic kidney disease: Secondary | ICD-10-CM | POA: Diagnosis not present

## 2020-08-14 DIAGNOSIS — E1143 Type 2 diabetes mellitus with diabetic autonomic (poly)neuropathy: Secondary | ICD-10-CM | POA: Diagnosis not present

## 2020-08-14 DIAGNOSIS — N1832 Chronic kidney disease, stage 3b: Secondary | ICD-10-CM | POA: Diagnosis not present

## 2020-08-14 LAB — MYOMARKER 3 PLUS PROFILE (RDL)

## 2020-08-15 ENCOUNTER — Encounter: Payer: Self-pay | Admitting: *Deleted

## 2020-08-16 ENCOUNTER — Other Ambulatory Visit: Payer: Self-pay | Admitting: Hospice

## 2020-08-16 DIAGNOSIS — I5031 Acute diastolic (congestive) heart failure: Secondary | ICD-10-CM | POA: Diagnosis not present

## 2020-08-16 DIAGNOSIS — E1122 Type 2 diabetes mellitus with diabetic chronic kidney disease: Secondary | ICD-10-CM | POA: Diagnosis not present

## 2020-08-16 DIAGNOSIS — K7469 Other cirrhosis of liver: Secondary | ICD-10-CM | POA: Diagnosis not present

## 2020-08-16 DIAGNOSIS — J849 Interstitial pulmonary disease, unspecified: Secondary | ICD-10-CM | POA: Diagnosis not present

## 2020-08-16 DIAGNOSIS — J9601 Acute respiratory failure with hypoxia: Secondary | ICD-10-CM | POA: Diagnosis not present

## 2020-08-16 DIAGNOSIS — E1143 Type 2 diabetes mellitus with diabetic autonomic (poly)neuropathy: Secondary | ICD-10-CM | POA: Diagnosis not present

## 2020-08-16 DIAGNOSIS — N1832 Chronic kidney disease, stage 3b: Secondary | ICD-10-CM | POA: Diagnosis not present

## 2020-08-16 DIAGNOSIS — I69354 Hemiplegia and hemiparesis following cerebral infarction affecting left non-dominant side: Secondary | ICD-10-CM | POA: Diagnosis not present

## 2020-08-16 DIAGNOSIS — I130 Hypertensive heart and chronic kidney disease with heart failure and stage 1 through stage 4 chronic kidney disease, or unspecified chronic kidney disease: Secondary | ICD-10-CM | POA: Diagnosis not present

## 2020-08-29 ENCOUNTER — Other Ambulatory Visit: Payer: Self-pay

## 2020-08-29 ENCOUNTER — Other Ambulatory Visit: Payer: Self-pay | Admitting: Hospice

## 2020-08-29 ENCOUNTER — Other Ambulatory Visit: Payer: Self-pay | Admitting: Family Medicine

## 2020-08-29 DIAGNOSIS — Z515 Encounter for palliative care: Secondary | ICD-10-CM

## 2020-08-29 DIAGNOSIS — I5020 Unspecified systolic (congestive) heart failure: Secondary | ICD-10-CM

## 2020-08-29 DIAGNOSIS — R0600 Dyspnea, unspecified: Secondary | ICD-10-CM

## 2020-08-29 DIAGNOSIS — I10 Essential (primary) hypertension: Secondary | ICD-10-CM

## 2020-08-29 DIAGNOSIS — R0609 Other forms of dyspnea: Secondary | ICD-10-CM

## 2020-08-29 MED ORDER — ALBUTEROL SULFATE HFA 108 (90 BASE) MCG/ACT IN AERS: 2.0000 | INHALATION_SPRAY | RESPIRATORY_TRACT | 11 refills | Status: AC | PRN

## 2020-08-29 MED ORDER — POTASSIUM CHLORIDE ER 10 MEQ PO TBCR
10.0000 meq | EXTENDED_RELEASE_TABLET | Freq: Every day | ORAL | 1 refills | Status: AC
Start: 2020-08-29 — End: ?

## 2020-08-29 NOTE — Telephone Encounter (Signed)
Stacey Canny, NP with Palliative care saw pt for an appt today. She states pt is having increased bilateral leg edema. Given the increase swelling she has instructed family to give her the lasix BID x3 days and then go back to qd. Also she is recommending we add K 10 meq at the least (maybe even 20 meq), she also suggest pt needs labs soon to check her levels.  Stacey Canny, NP is requesting PCP send in a Rx of K (at least 10 meq unless PCP thinks she needs 20 meq), and also refill her inhaler. Pt has plenty lasix so doesn't need that med refilled.  [facility] [address]

## 2020-08-29 NOTE — Progress Notes (Signed)
[facility] Consult Note Telephone: [phone]  Fax: [phone]  PATIENT NAME: Stacey Harris [address] [place] [address] [id number] (home)  DOB: 1929-06-25 MRN: [id number]  PRIMARY CARE PROVIDER:    [name], MD,  [place] [address] [phone]  REFERRING PROVIDER:   [name], MD [address] [place],  [place] [address] [phone]  RESPONSIBLE PARTY:  Stacey Harris  Extended Emergency Contact Information Primary Emergency Contact: Stacey Harris Address: [address] [place], [place] [address] Johnnette Litter of [place] Phone: [phone] Work Phone: [phone] Mobile Phone: [phone] Relation: Daughter Secondary Emergency Contact: Stacey Harris [place] of [place] Mobile Phone: [phone] Relation: Daughter  CHIEF COMPLAIN: Initial palliative care visit/shortness of breath  Visit is to build trust and highlight Palliative Medicine as specialized medical care for people living with serious illness, aimed at facilitating better quality of life through symptoms relief, assisting with advance care plan and establishing goals of care. Stacey Harris is present during visit.  Examination Discussion on the difference between Palliative and Hospice care.  Visit consisted of counseling and education dealing with the complex and emotionally intense issues of symptom management and palliative care in the setting of serious and potentially life-threatening illness. Palliative care team will continue to support patient, patient's family, and medical team.  RECOMMENDATIONS/PLAN:   Advance Care Planning: Our advance care planning conversation included a discussion about  the value and importance of advance care planning, exploration of goals of care in the event of a sudden injury or illness, identification and preparation of a healthcare agent and review  and updating or creation of an advance directive document.  CODE STATUS: Patient is a DO NOT RESUSCITATE.  NP signed DNR form for patient to keep at home; same document uploaded to epic today.  GOALS OF CARE: Goals of care include to maximize quality of life and symptom management.   I spent 46  minutes providing this consultation. More than 50% of the time in this consultation was spent in counseling and care coordination. ____________________________________________________________________________  Symptom Management:  Shortness of breath: Encourage slow deep breathing.  Continue oxygen 2L/Min prn shortness of breath. Continue current Lasix dose 20mg  daily with additional 20mg  daily x 3 days. Patient is currently not on potassium. Initiate Potassium 10 MEQ daily. Monitor electrolytes. CBC BMP.  Recommendation via e chat and to [facility] in PCP's office.  Continue Albuterol as ordered. Weakness: Continue with ongoing PT/OT. Balance of rest and performance activity.  Palliative will continue to monitor for symptom management/decline and make recommendations as needed. Return 6 weeks or prn. Encouraged to call provider sooner with any concerns.   Family /Caregiver/Community Supports: Patient lives at home with her daughter Stacey Harris.  She has privately arranged caregivers that helps with activities of daily living.  PPS: 40%  HOSPICE ELIGIBILITY/DIAGNOSIS: TBD  HISTORY OF PRESENT ILLNESS:  Stacey Harris is a 87 y.o. female with multiple medical problems including shortness of breath related to [facility]; shortness of breath started about a year ago, worsened with moving and acitivities of daily living.  It has impaired her independence and activities of daily living.  When she sits down and rest, shortness of breath is relieved. Patient is currently on oxygen 2L/Min prn shortness of breath Stacey Harris reports CAT scan scheduled for tomorrow  due to abnormal CT scan of lung. Patient is followed by a  pulmonologist. Recent hospitaliation for acute respiratory failure with hypoxia 12/20 - 07/01/21.  History of stroke,  CKD, HTN Type 2 DM other cirrhosis of liver, hyperlipidemia. History obtained from review of EMR, discussion with primary team/primary RN and interview with patient/family. Review and summarization of old Epic records shows history from other than patient. Rest of 10 point system reviewed and negative. Palliative Care was asked to follow this patient by consultation request of Stacey Harris, Wynelle [name], MD to help address complex decision making in the context of goals of care.   Review lab tests/diagnostics  No results for input(s): WBC, HGB, HCT, PLT, MCV in the last 168 hours. No results for input(s): NA, K, CL, CO2, BUN, CREATININE, GLUCOSE in the last 168 hours. Latest GFR by Cockcroft Gault (not valid in AKI or ESRD) CrCl cannot be calculated (Patient's most recent lab result is older than the maximum 21 days allowed.). No results for input(s): AST, ALT, ALKPHOS, GGT in the last 168 hours.  Invalid input(s): TBILI, CONJBILI, ALB, TOTALPROTEIN No components found for: ALB No results for input(s): APTT, INR in the last 168 hours.  Invalid input(s): PTPATIENT No results for input(s): BNP, PROBNP in the last 168 hours.  PAST MEDICAL HISTORY:  Past Medical History:  Diagnosis Date  . Blood transfusion   . Cancer ([facility])    basal cell skin CA  . Diabetes mellitus    type II  . Dyspnea   . Frequent UTI   . Hyperlipidemia   . Hypertension   . Hypothyroidism   . Macular degeneration of both eyes   . OA (osteoarthritis)   . Stroke [facility]) 03/30/2019     SOCIAL HX:  Social History   Tobacco Use  . Smoking status: Never Smoker  . Smokeless tobacco: Never Used  Substance Use Topics  . Alcohol use: No    Alcohol/week: 0.0 standard drinks    FAMILY HX:  Family History  Problem Relation Age of Onset  . Arthritis Father        RA  . Hypertension Father   . Heart disease  Father        CAD  . Diabetes Father   . Arthritis Sister        RA  . Diabetes Brother   . Cancer Brother   . Diabetes Brother   . Cancer Brother   . Diabetes Brother   . Cancer Brother   . Diabetes Sister   . Cancer Brother     ALLERGIES:  Allergies  Allergen Reactions  . Keflex [Cephalexin]     Did ok with lower dose, higher dose caused a rash    . Mavik [Trandolapril]     Cough       PERTINENT MEDICATIONS:  Outpatient Encounter Medications as of 08/29/2020  Medication Sig  . acetaminophen (TYLENOL) 325 MG tablet Take 1-2 tablets (325-650 mg total) by mouth every 4 (four) hours as needed for mild pain.  [name] albuterol (VENTOLIN HFA) 108 (90 Base) MCG/ACT inhaler Inhale 2 puffs into the lungs every 4 (four) hours as needed for wheezing (cough).  [name] aspirin EC 81 MG tablet Take 81 mg by mouth daily. Swallow whole.  . Cholecalciferol (VITAMIN D PO) Take 5,000 Units by mouth daily.  [name] CRANBERRY PO Take 2 tablets by mouth daily.   [name] dextromethorphan (DELSYM) 30 MG/5ML liquid Take 60 mg by mouth at bedtime as needed for cough. 23ml  . furosemide (LASIX) 20 MG tablet TAKE 1 TABLET BY MOUTH EVERY  DAY (Patient taking differently: Take 20 mg by mouth daily.)  . glimepiride (AMARYL) 4 MG tablet TAKE 1 TABLET BY MOUTH EVERY DAY BEFORE BREAKFAST (Patient taking differently: Take 4 mg by mouth daily with breakfast.)  . guaiFENesin (MUCINEX) 600 MG 12 hr tablet Take 1 tablet (600 mg total) by mouth 2 (two) times daily.  [name] levothyroxine (SYNTHROID) 50 MCG tablet Take 1 tablet (50 mcg total) by mouth daily.  . Multiple Vitamin (MULTIVITAMIN) capsule Take 1 capsule by mouth daily.  . nadolol (CORGARD) 20 MG tablet Take 0.5 tablets (10 mg total) by mouth daily. (Patient taking differently: Take 10 mg by mouth daily. As needed)  . Omega-3 Fatty Acids (FISH OIL) 1200 MG CAPS Take 1,200 mg by mouth daily.  [name] omeprazole (PRILOSEC) 40 MG capsule Take 1 capsule (40 mg total) by mouth daily.  . TRUE  METRIX BLOOD GLUCOSE TEST test strip CHECK BLOOD SUGAR ONCE DAILY AND AS DIRECTED.DX E11.43  . TRUEplus Lancets 30G MISC 1 EACH BY SUBDERMAL ROUTE DAILY. CHECK BLOOD SUGAR ONCE DAILY AND AS DIRECTED.DX E11.43   No facility-administered encounter medications on file as of 08/29/2020.    ROS  General: NAD Constitution: Denies fever/chills EYES: denies vision changes ENMT: denies Xerostomia,  dysphagia Cardiovascular: denies chest pain Pulmonary: Endorses occasional cough, dyspnea on exertion Abdomen: endorses fair appetite, denies constipation or diarrhea GU: denies dysuria, urinary frequency MSK:  endorses ROM limitations, no falls reported Skin: denies rashes/bruising Neurological: endorses weakness, denies pain, insomnia Psych: Endorses positive mood Heme/lymph/immuno: denies bruises,  abnormal bleeding   PHYSICAL EXAM  Height:  5 feet 2 inches   Weight:154 Ibs General: In no acute distress Cardiovascular: regular rate and rhythm Pulmonary: shortness of breath on going to the bathroom during visit; improved at rest and with oxygen supplementation at 2L/Min Abdomen: soft, non tender, positive bowel sounds in all quadrants GU:  no suprapubic tenderness Eyes: Normal lids, no discharge, sclera anicteric ENMT: Moist mucous membranes Musculoskeletal: Nonpitting edema in BLE Skin: no rash to visible skin, dry skin, warm without cyanosis Psych: non-anxious affect Neurological: Weakness but otherwise non focal Heme/lymph/immuno: no bruises, no bleeding  Thank you for the opportunity to participate in the care of [place] Please call our office at [phone] if we can be of additional assistance.  Note: Portions of this note were generated with Lobbyist. Dictation errors may occur despite best attempts at proofreading.  Teodoro Spray, NP

## 2020-08-29 NOTE — Telephone Encounter (Signed)
I sent them  Can they draw labs for [place] or does she need to come in here?

## 2020-08-29 NOTE — Telephone Encounter (Signed)
It looks like she will need labs here - I discussed with the palliative team  Would you please try to schedule labs for bmet towards the end of the week?

## 2020-08-29 NOTE — Telephone Encounter (Signed)
Spoke with daughter advise Rxs sent to pharmacy. F/u lab appt scheduled for 09/02/20, per Karna Christmas they will do [facility] labs so pt doesn't have to try and get out.  Please put lab order in when able

## 2020-08-30 ENCOUNTER — Ambulatory Visit
Admission: RE | Admit: 2020-08-30 | Discharge: 2020-08-30 | Disposition: A | Discharge status: Home / Self Care | Payer: Medicare HMO | Source: Ambulatory Visit | Attending: Pulmonary Disease | Admitting: Pulmonary Disease

## 2020-08-30 DIAGNOSIS — J84112 Idiopathic pulmonary fibrosis: Secondary | ICD-10-CM | POA: Diagnosis not present

## 2020-08-30 DIAGNOSIS — K449 Diaphragmatic hernia without obstruction or gangrene: Secondary | ICD-10-CM | POA: Diagnosis not present

## 2020-08-30 DIAGNOSIS — J479 Bronchiectasis, uncomplicated: Secondary | ICD-10-CM | POA: Diagnosis not present

## 2020-08-30 DIAGNOSIS — R0600 Dyspnea, unspecified: Secondary | ICD-10-CM | POA: Diagnosis not present

## 2020-08-30 DIAGNOSIS — J849 Interstitial pulmonary disease, unspecified: Secondary | ICD-10-CM

## 2020-09-01 DIAGNOSIS — J9601 Acute respiratory failure with hypoxia: Secondary | ICD-10-CM | POA: Diagnosis not present

## 2020-09-01 DIAGNOSIS — N1832 Chronic kidney disease, stage 3b: Secondary | ICD-10-CM | POA: Diagnosis not present

## 2020-09-01 DIAGNOSIS — I5031 Acute diastolic (congestive) heart failure: Secondary | ICD-10-CM | POA: Diagnosis not present

## 2020-09-01 DIAGNOSIS — I69354 Hemiplegia and hemiparesis following cerebral infarction affecting left non-dominant side: Secondary | ICD-10-CM | POA: Diagnosis not present

## 2020-09-01 DIAGNOSIS — E1143 Type 2 diabetes mellitus with diabetic autonomic (poly)neuropathy: Secondary | ICD-10-CM | POA: Diagnosis not present

## 2020-09-01 DIAGNOSIS — K7469 Other cirrhosis of liver: Secondary | ICD-10-CM | POA: Diagnosis not present

## 2020-09-01 DIAGNOSIS — I130 Hypertensive heart and chronic kidney disease with heart failure and stage 1 through stage 4 chronic kidney disease, or unspecified chronic kidney disease: Secondary | ICD-10-CM | POA: Diagnosis not present

## 2020-09-01 DIAGNOSIS — J849 Interstitial pulmonary disease, unspecified: Secondary | ICD-10-CM | POA: Diagnosis not present

## 2020-09-01 DIAGNOSIS — E1122 Type 2 diabetes mellitus with diabetic chronic kidney disease: Secondary | ICD-10-CM | POA: Diagnosis not present

## 2020-09-02 ENCOUNTER — Other Ambulatory Visit (INDEPENDENT_AMBULATORY_CARE_PROVIDER_SITE_OTHER): Payer: Medicare HMO

## 2020-09-02 DIAGNOSIS — I10 Essential (primary) hypertension: Secondary | ICD-10-CM

## 2020-09-02 LAB — COMPREHENSIVE METABOLIC PANEL
ALT: 12 U/L (ref 0–35)
AST: 34 U/L (ref 0–37)
Albumin: 2.4 g/dL — ABNORMAL LOW (ref 3.5–5.2)
Alkaline Phosphatase: 80 U/L (ref 39–117)
BUN: 18 mg/dL (ref 6–23)
CO2: 28 mEq/L (ref 19–32)
Calcium: 9 mg/dL (ref 8.4–10.5)
Chloride: 103 mEq/L (ref 96–112)
Creatinine, Ser: 1.34 mg/dL — ABNORMAL HIGH (ref 0.40–1.20)
GFR: 35.2 mL/min — ABNORMAL LOW (ref >60.00)
Glucose, Bld: 274 mg/dL — ABNORMAL HIGH (ref 70–99)
Potassium: 4.1 mEq/L (ref 3.5–5.1)
Sodium: 140 mEq/L (ref 135–145)
Total Bilirubin: 1 mg/dL (ref 0.2–1.2)
Total Protein: 6.7 g/dL (ref 6.0–8.3)

## 2020-09-06 DIAGNOSIS — K7469 Other cirrhosis of liver: Secondary | ICD-10-CM | POA: Diagnosis not present

## 2020-09-06 DIAGNOSIS — N1832 Chronic kidney disease, stage 3b: Secondary | ICD-10-CM | POA: Diagnosis not present

## 2020-09-06 DIAGNOSIS — I69354 Hemiplegia and hemiparesis following cerebral infarction affecting left non-dominant side: Secondary | ICD-10-CM | POA: Diagnosis not present

## 2020-09-06 DIAGNOSIS — E1122 Type 2 diabetes mellitus with diabetic chronic kidney disease: Secondary | ICD-10-CM | POA: Diagnosis not present

## 2020-09-06 DIAGNOSIS — I5031 Acute diastolic (congestive) heart failure: Secondary | ICD-10-CM | POA: Diagnosis not present

## 2020-09-06 DIAGNOSIS — E1143 Type 2 diabetes mellitus with diabetic autonomic (poly)neuropathy: Secondary | ICD-10-CM | POA: Diagnosis not present

## 2020-09-06 DIAGNOSIS — I130 Hypertensive heart and chronic kidney disease with heart failure and stage 1 through stage 4 chronic kidney disease, or unspecified chronic kidney disease: Secondary | ICD-10-CM | POA: Diagnosis not present

## 2020-09-06 DIAGNOSIS — J9601 Acute respiratory failure with hypoxia: Secondary | ICD-10-CM | POA: Diagnosis not present

## 2020-09-06 DIAGNOSIS — J849 Interstitial pulmonary disease, unspecified: Secondary | ICD-10-CM | POA: Diagnosis not present

## 2020-09-07 ENCOUNTER — Telehealth: Payer: Self-pay | Admitting: Family Medicine

## 2020-09-07 ENCOUNTER — Other Ambulatory Visit: Payer: Self-pay | Admitting: Family Medicine

## 2020-09-07 NOTE — Telephone Encounter (Signed)
Hospice is calling to see if the orders are ready for this patient. Please give her a call back to discuss.  Would like the orders faxed to her at : [phone]

## 2020-09-07 NOTE — Telephone Encounter (Signed)
Please verbally ok that order and I can be attending. Will work on the forms also

## 2020-09-07 NOTE — Telephone Encounter (Signed)
Form faxed

## 2020-09-07 NOTE — Telephone Encounter (Signed)
Done and in IN box 

## 2020-09-07 NOTE — Telephone Encounter (Signed)
Also received a form PCP has to fill out for referral, placed in your inbox for review

## 2020-09-07 NOTE — Telephone Encounter (Signed)
Verbal order given and they will await the form

## 2020-09-07 NOTE — Telephone Encounter (Signed)
Mrs. Stacey Harris called and needed verbal orders for hospice care.  Needed to know the pt terminal illness,  And wanted to know if she wants to stay as the attending physician.

## 2020-09-08 DIAGNOSIS — E1143 Type 2 diabetes mellitus with diabetic autonomic (poly)neuropathy: Secondary | ICD-10-CM | POA: Diagnosis not present

## 2020-09-08 DIAGNOSIS — I69354 Hemiplegia and hemiparesis following cerebral infarction affecting left non-dominant side: Secondary | ICD-10-CM | POA: Diagnosis not present

## 2020-09-08 DIAGNOSIS — J9601 Acute respiratory failure with hypoxia: Secondary | ICD-10-CM | POA: Diagnosis not present

## 2020-09-08 DIAGNOSIS — I130 Hypertensive heart and chronic kidney disease with heart failure and stage 1 through stage 4 chronic kidney disease, or unspecified chronic kidney disease: Secondary | ICD-10-CM | POA: Diagnosis not present

## 2020-09-08 DIAGNOSIS — K7469 Other cirrhosis of liver: Secondary | ICD-10-CM | POA: Diagnosis not present

## 2020-09-08 DIAGNOSIS — N1832 Chronic kidney disease, stage 3b: Secondary | ICD-10-CM | POA: Diagnosis not present

## 2020-09-08 DIAGNOSIS — J849 Interstitial pulmonary disease, unspecified: Secondary | ICD-10-CM | POA: Diagnosis not present

## 2020-09-08 DIAGNOSIS — I5031 Acute diastolic (congestive) heart failure: Secondary | ICD-10-CM | POA: Diagnosis not present

## 2020-09-08 DIAGNOSIS — E1122 Type 2 diabetes mellitus with diabetic chronic kidney disease: Secondary | ICD-10-CM | POA: Diagnosis not present

## 2020-09-13 ENCOUNTER — Other Ambulatory Visit: Payer: Self-pay | Admitting: Family Medicine

## 2020-09-13 DIAGNOSIS — I5032 Chronic diastolic (congestive) heart failure: Secondary | ICD-10-CM

## 2020-09-15 ENCOUNTER — Other Ambulatory Visit: Payer: Self-pay | Admitting: *Deleted

## 2020-09-15 DIAGNOSIS — J849 Interstitial pulmonary disease, unspecified: Secondary | ICD-10-CM

## 2020-09-16 ENCOUNTER — Other Ambulatory Visit (HOSPITAL_COMMUNITY): Payer: Medicare HMO

## 2020-09-20 ENCOUNTER — Ambulatory Visit: Payer: Medicare HMO | Admitting: Pulmonary Disease

## 2020-09-29 ENCOUNTER — Telehealth: Payer: Self-pay

## 2020-09-29 DIAGNOSIS — I5031 Acute diastolic (congestive) heart failure: Secondary | ICD-10-CM | POA: Diagnosis not present

## 2020-09-29 DIAGNOSIS — J9601 Acute respiratory failure with hypoxia: Secondary | ICD-10-CM | POA: Diagnosis not present

## 2020-09-29 NOTE — Chronic Care Management (AMB) (Addendum)
Chronic Care Management Pharmacy Assistant   Name: Stacey Harris  MRN: [id number] DOB: Sep 16, 1928  Reason for Encounter: Disease State HTN and DM    Conditions to be addressed/monitored: HTN and DMII    Recent office visits:    08/29/2020 Laverda Sorenson,  NP  Palliative Care  07/18/2020 Telemedicine visit   Dr.Tower held  Losartan  06/10/2020 Dr.Tower    Recent consult visits:    Hospital visits:  Admitted to the hospital on 06/27/2020 due to Hypoxemia . Discharge date was 07/01/2020. Discharged from [facility].   Medications: Outpatient Encounter Medications as of 09/29/2020  Medication Sig   acetaminophen (TYLENOL) 325 MG tablet Take 1-2 tablets (325-650 mg total) by mouth every 4 (four) hours as needed for mild pain.   albuterol (VENTOLIN HFA) 108 (90 Base) MCG/ACT inhaler Inhale 2 puffs into the lungs every 4 (four) hours as needed for wheezing (cough).   aspirin EC 81 MG tablet Take 81 mg by mouth daily. Swallow whole.   Cholecalciferol (VITAMIN D PO) Take 5,000 Units by mouth daily.   CRANBERRY PO Take 2 tablets by mouth daily.    [employer] Lancets Ultra-Thin 30G MISC 1 EACH BY SUBDERMAL ROUTE DAILY. CHECK BLOOD SUGAR ONCE DAILY AND AS DIRECTED.DX E11.43   dextromethorphan (DELSYM) 30 MG/5ML liquid Take 60 mg by mouth at bedtime as needed for cough. 52ml   furosemide (LASIX) 20 MG tablet TAKE 1 TABLET BY MOUTH EVERY DAY   glimepiride (AMARYL) 4 MG tablet TAKE 1 TABLET BY MOUTH EVERY DAY BEFORE BREAKFAST (Patient taking differently: Take 4 mg by mouth daily with breakfast.)   guaiFENesin (MUCINEX) 600 MG 12 hr tablet Take 1 tablet (600 mg total) by mouth 2 (two) times daily.   levothyroxine (SYNTHROID) 50 MCG tablet Take 1 tablet (50 mcg total) by mouth daily.   Multiple Vitamin (MULTIVITAMIN) capsule Take 1 capsule by mouth daily.   nadolol (CORGARD) 20 MG tablet Take 0.5 tablets (10 mg total) by mouth daily. (Patient taking differently: Take 10 mg by mouth daily. As  needed)   Omega-3 Fatty Acids (FISH OIL) 1200 MG CAPS Take 1,200 mg by mouth daily.   omeprazole (PRILOSEC) 40 MG capsule Take 1 capsule (40 mg total) by mouth daily.   potassium chloride (KLOR-CON) 10 MEQ tablet Take 1 tablet (10 mEq total) by mouth daily.   TRUE METRIX BLOOD GLUCOSE TEST test strip CHECK BLOOD SUGAR ONCE DAILY AND AS DIRECTED.DX E11.43   No facility-administered encounter medications on file as of 09/29/2020.    Reviewed chart prior to disease state call. Spoke with patient regarding BP  Recent Office Vitals: BP Readings from Last 3 Encounters:  08/02/20 130/74  07/18/20 130/80  07/01/20 (!) 114/54   Pulse Readings from Last 3 Encounters:  08/02/20 90  07/01/20 86  06/10/20 73    Wt Readings from Last 3 Encounters:  08/02/20 152 lb 6.4 oz (69.1 kg)  07/18/20 150 lb (68 kg)  06/30/20 150 lb 2.1 oz (68.1 kg)     Kidney Function Lab Results  Component Value Date/Time   CREATININE 1.34 (H) 09/02/2020 12:01 PM   CREATININE 1.48 (H) 07/01/2020 03:31 AM   CREATININE 1.1 09/03/2016 09:02 AM   CREATININE 1.1 02/27/2016 09:46 AM   GFR 35.20 (L) 09/02/2020 12:01 PM   GFRNONAA 34 (L) 07/01/2020 03:31 AM   GFRAA 40 (L) 04/15/2019 05:04 AM    BMP Latest Ref Rng & Units 09/02/2020 07/01/2020 06/30/2020  Glucose 70 - 99  mg/dL 274(H) 154(H) 250(H)  BUN 6 - 23 mg/dL 18 36(H) 41(H)  Creatinine 0.40 - 1.20 mg/dL 1.34(H) 1.48(H) 1.72(H)  Sodium 135 - 145 mEq/L 140 134(L) 135  Potassium 3.5 - 5.1 mEq/L 4.1 4.6 3.7  Chloride 96 - 112 mEq/L 103 102 104  CO2 19 - 32 mEq/L 28 23 24   Calcium 8.4 - 10.5 mg/dL 9.0 8.3(L) 7.9(L)    Current antihypertensive regimen:  Losartan 25 mg - 1 tablet daily( on hold per Dr.Tower) Lasix 20 mg - 1 tablet daily   For CHF  How often are you checking your Blood Pressure? daily   Current home BP readings:  120/80  What recent interventions/DTPs have been made by any provider to improve Blood Pressure control since last CPP Visit:    Losartan held by Dr.Tower  Any recent hospitalizations or ED visits since last visit with CPP? Yes   What diet changes have been made to improve Blood Pressure Control?  No changes What exercise is being done to improve your Blood Pressure Control?  No exercise. Unable to walk at all, even with walker.   Adherence Review: Is the patient currently on ACE/ARB medication? Yes Does the patient have >5 day gap between last estimated fill dates? No  Recent Relevant Labs: Lab Results  Component Value Date/Time   HGBA1C 7.9 (H) 06/27/2020 08:12 PM   HGBA1C 7.9 (H) 06/10/2020 01:17 PM   MICROALBUR 0.6 06/09/2009 09:33 AM   MICROALBUR 1.0 05/26/2008 10:01 AM    Kidney Function Lab Results  Component Value Date/Time   CREATININE 1.34 (H) 09/02/2020 12:01 PM   CREATININE 1.48 (H) 07/01/2020 03:31 AM   CREATININE 1.1 09/03/2016 09:02 AM   CREATININE 1.1 02/27/2016 09:46 AM   GFR 35.20 (L) 09/02/2020 12:01 PM   GFRNONAA 34 (L) 07/01/2020 03:31 AM   GFRAA 40 (L) 04/15/2019 05:04 AM    Current antihyperglycemic regimen:  Glimepiride 4 mg - 1 tablet daily  What recent interventions/DTPs have been made to improve glycemic control:  No changes or interventions   Have there been any recent hospitalizations or ED visits since last visit with CPP? Yes   Patient denies hypoglycemic symptoms, including Pale, Sweaty, Shaky, Hungry, Nervous/irritable and Vision changes   Patient denies hyperglycemic symptoms, including blurry vision, excessive thirst, fatigue, polyuria and weakness   How often are you checking your blood sugar? twice daily   What are your blood sugars ranging?  Only had 1 reading available. Fasting: 98 Before meals: N/A After meals: N/A Bedtime: N/A  During the week, how often does your blood glucose drop below 70? Never   Adherence Review: Is the patient currently on a STATIN medication? No Is the patient currently on ACE/ARB medication? No  Currently on hold per  Dr.Tower Does the patient have >5 day gap between last estimated fill dates? No   Star Rating Drugs: Glimepiride 4 mg. 07/07/2020  90 DS  Spoke with Lidia Collum about patient. States patient is stable but not well. She is losing quite a bit of weight. Unable to stand.   Follow-Up:  Pharmacist Review  Debbora Dus, CPP notified  Margaretmary Dys, [place] Assistant [phone]  I have reviewed the care management and care coordination activities outlined in this encounter and I am certifying that I agree with the content of this note. No further action required.  Debbora Dus, PharmD Clinical Pharmacist [facility] at [facility] [phone]

## 2020-10-23 ENCOUNTER — Other Ambulatory Visit: Payer: Self-pay | Admitting: Cardiovascular Disease

## 2020-10-30 DIAGNOSIS — I5031 Acute diastolic (congestive) heart failure: Secondary | ICD-10-CM | POA: Diagnosis not present

## 2020-10-30 DIAGNOSIS — J9601 Acute respiratory failure with hypoxia: Secondary | ICD-10-CM | POA: Diagnosis not present

## 2020-11-03 ENCOUNTER — Other Ambulatory Visit: Payer: Self-pay | Admitting: Family Medicine

## 2020-11-11 ENCOUNTER — Telehealth: Payer: Self-pay

## 2020-11-11 NOTE — Chronic Care Management (AMB) (Addendum)
    Chronic Care Management Pharmacy Assistant   Name: Stacey Harris  MRN: [id number] DOB: 1929-01-03  Reason for Encounter: Disease State- CHF  Recent office visits:  None since last CCM contact  Recent consult visits:  None since last CCM contact  Hospital visits:  06/27/20- Admission for Acute respiratory failure with hypoxia. [facility].   Medications: Outpatient Encounter Medications as of 11/11/2020  Medication Sig   acetaminophen (TYLENOL) 325 MG tablet Take 1-2 tablets (325-650 mg total) by mouth every 4 (four) hours as needed for mild pain.   albuterol (VENTOLIN HFA) 108 (90 Base) MCG/ACT inhaler Inhale 2 puffs into the lungs every 4 (four) hours as needed for wheezing (cough).   aspirin EC 81 MG tablet Take 81 mg by mouth daily. Swallow whole.   Cholecalciferol (VITAMIN D PO) Take 5,000 Units by mouth daily.   CRANBERRY PO Take 2 tablets by mouth daily.    [employer] Lancets Ultra-Thin 30G MISC 1 EACH BY SUBDERMAL ROUTE DAILY. CHECK BLOOD SUGAR ONCE DAILY AND AS DIRECTED.DX E11.43   dextromethorphan (DELSYM) 30 MG/5ML liquid Take 60 mg by mouth at bedtime as needed for cough. 51ml   furosemide (LASIX) 20 MG tablet TAKE 1 TABLET BY MOUTH EVERY DAY   glimepiride (AMARYL) 4 MG tablet TAKE 1 TABLET BY MOUTH EVERY DAY BEFORE BREAKFAST (Patient taking differently: Take 4 mg by mouth daily with breakfast.)   levothyroxine (SYNTHROID) 50 MCG tablet Take 1 tablet (50 mcg total) by mouth daily.   Multiple Vitamin (MULTIVITAMIN) capsule Take 1 capsule by mouth daily.   nadolol (CORGARD) 20 MG tablet Take 0.5 tablets (10 mg total) by mouth daily. (Patient taking differently: Take 10 mg by mouth daily. As needed)   Omega-3 Fatty Acids (FISH OIL) 1200 MG CAPS Take 1,200 mg by mouth daily.   omeprazole (PRILOSEC) 40 MG capsule TAKE 1 CAPSULE BY MOUTH EVERY DAY   potassium chloride (KLOR-CON) 10 MEQ tablet Take 1 tablet (10 mEq total) by mouth daily.   TRUE METRIX BLOOD GLUCOSE TEST  test strip CHECK BLOOD SUGAR ONCE DAILY AND AS DIRECTED.DX E11.43   No facility-administered encounter medications on file as of 11/11/2020.    Current heart failure regimen: Lasix 20 mg - 1 tablet daily   Are you taking a diuretic? Yes How often: Daily  Do you weigh yourself daily or regularly? No  Have any of the following symptoms worsened or changed from your baseline?   denies worsening of SOB, increased swelling, or abnormal weight gain of more than 3 pounds in one day or 5 pounds in one week  Do you see a cardiologist? No- patient now enrolled in palliative care              How often are you checking your Blood Pressure? 3-5x per week- yes, by palliative care   Star Rating Drugs: Medication:  Last Fill: Day Supply Glimeperide 4 mg 07/07/20 90  Patients daughter notes patient is now in hospice. Advised daughter, Stacey Harris to contact us with any concerns or questions.    Debbora Dus, CPP notified  Margaretmary Dys, [place] Pharmacy Assistant [phone]  I have reviewed the care management and care coordination activities outlined in this encounter and I am certifying that I agree with the content of this note. No further contact needed.  Debbora Dus, PharmD Clinical Pharmacist [facility] at [facility] [phone]

## 2020-11-29 DIAGNOSIS — I5031 Acute diastolic (congestive) heart failure: Secondary | ICD-10-CM | POA: Diagnosis not present

## 2020-11-29 DIAGNOSIS — J9601 Acute respiratory failure with hypoxia: Secondary | ICD-10-CM | POA: Diagnosis not present

## 2020-12-07 DEATH — deceased

## 2021-03-23 IMAGING — CT CT ANGIO CHEST
2 of 7 series · 18 of 46 positions shown · IV contrast (APPLIED)
Comparison: None.

CLINICAL DATA: PE suspected.

EXAM:
CT ANGIOGRAPHY CHEST WITH CONTRAST
TECHNIQUE: Multidetector CT imaging of the chest was performed using the
standard protocol during bolus administration of intravenous
contrast. Multiplanar CT image reconstructions and MIPs were
obtained to evaluate the vascular anatomy.
CONTRAST:  60mL OMNIPAQUE IOHEXOL 350 MG/ML SOLN

[Series 10: thins · axial · 0.62mm/px · z∈[-104,+144]mm · 15 of 399 slices shown]
[im 23/399  lung]
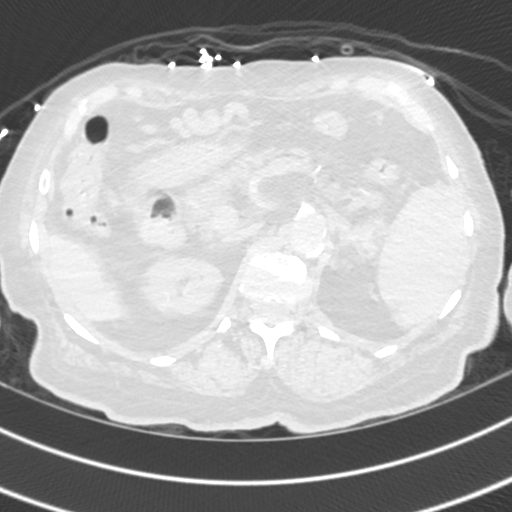
[im 45/399  soft-tissue]
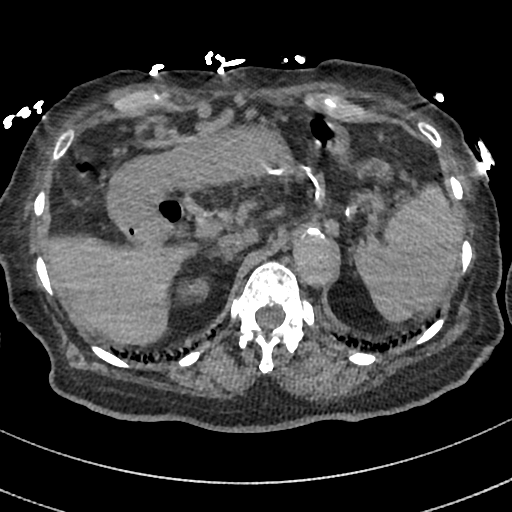
[im 67/399  lung]
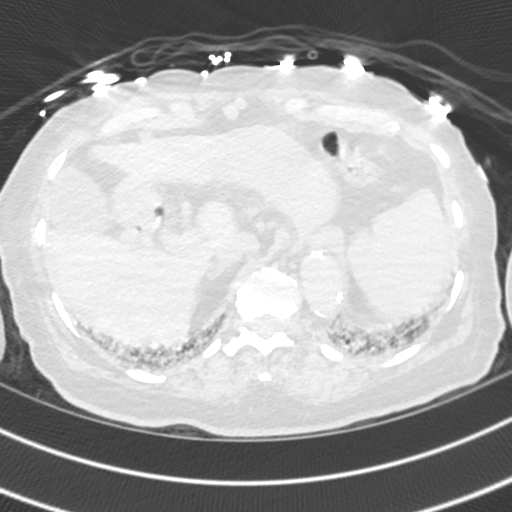
[im 89/399  soft-tissue]
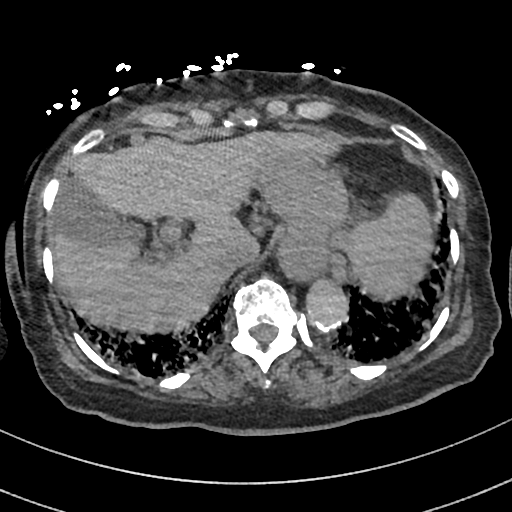
[im 133/399  lung]
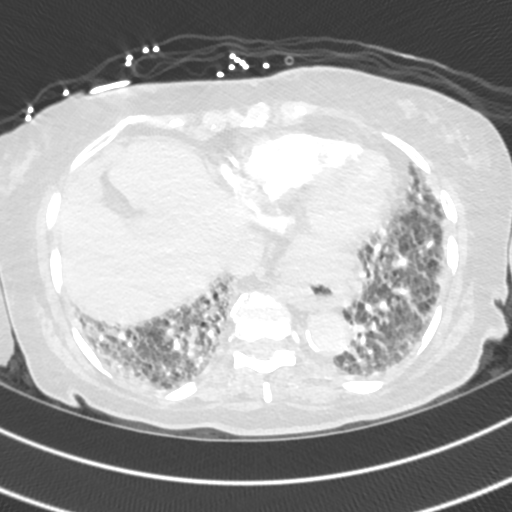
[im 155/399  soft-tissue]
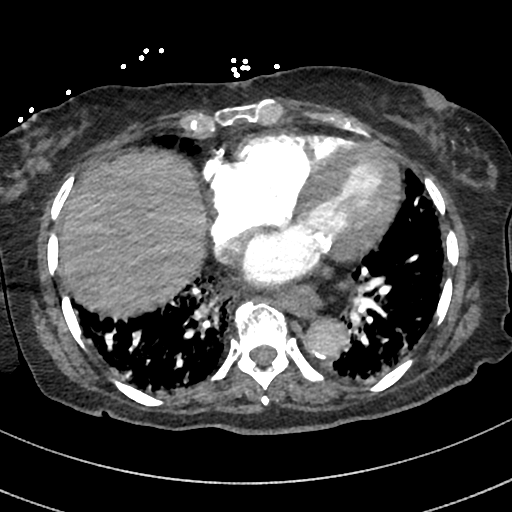
[im 177/399  lung]
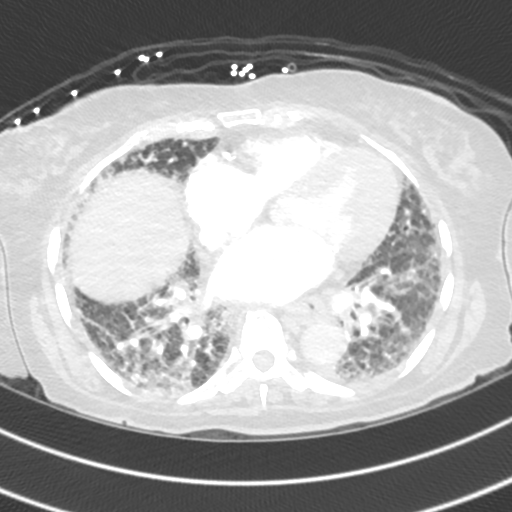
[im 200/399  soft-tissue]
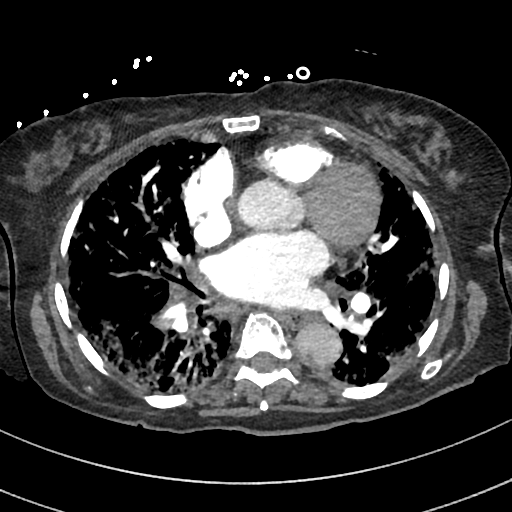
[im 222/399  lung]
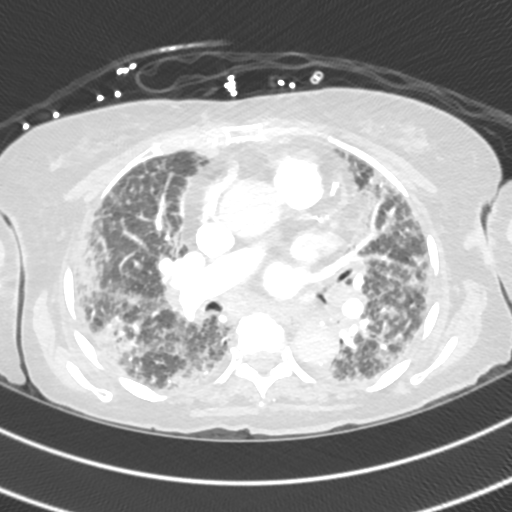
[im 244/399  soft-tissue]
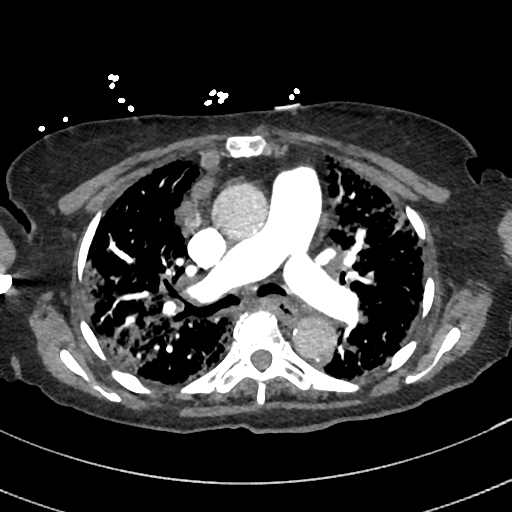
[im 266/399  lung]
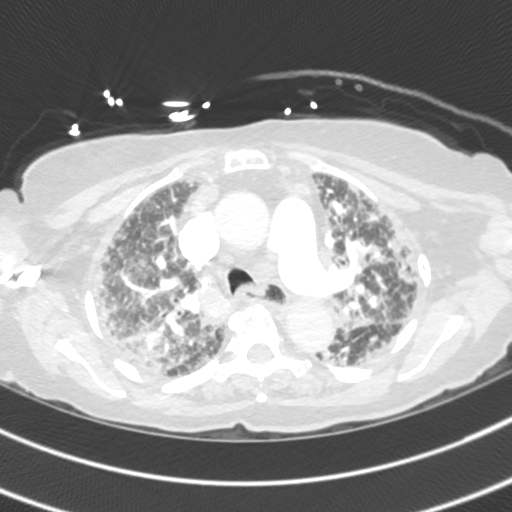
[im 310/399  soft-tissue]
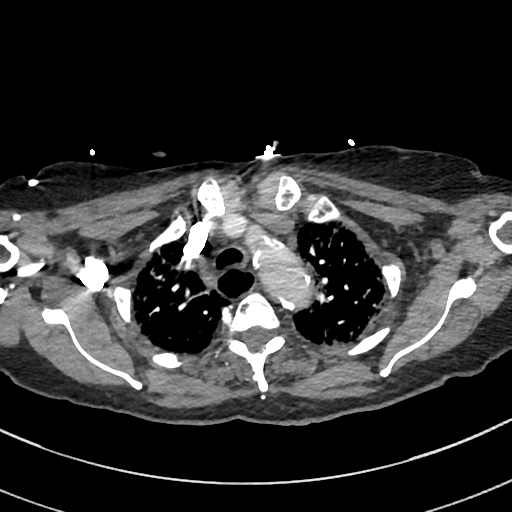
[im 332/399  lung]
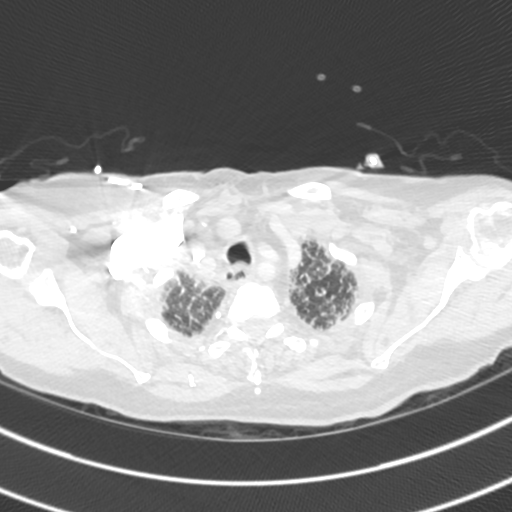
[im 354/399  soft-tissue]
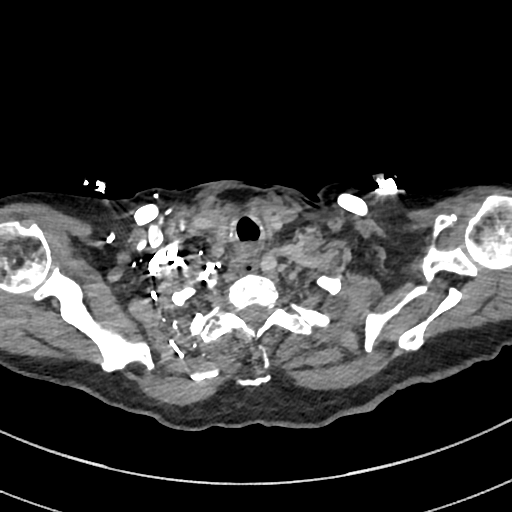
[im 376/399  lung]
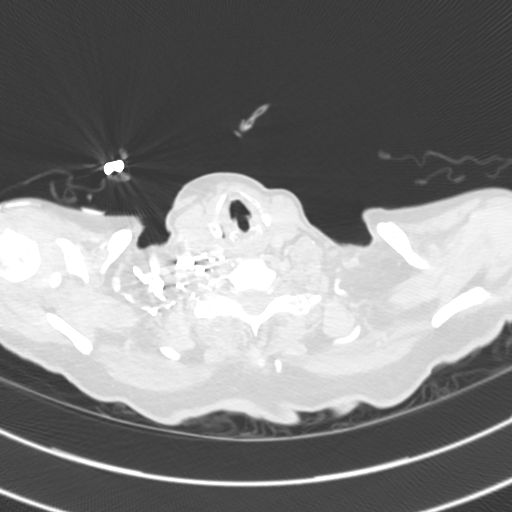

[Series 11: cor · coronal · 0.59mm/px · 3 of 119 slices shown]
[im 30/119  soft-tissue]
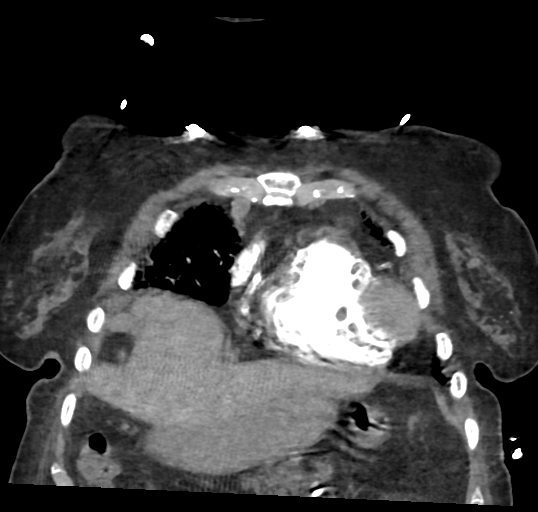
[im 60/119  soft-tissue]
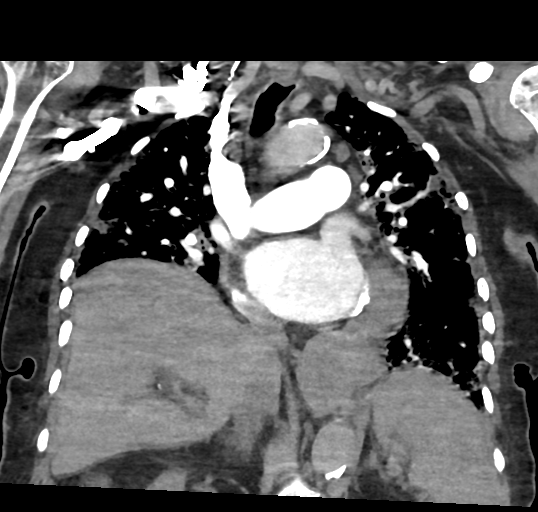
[im 89/119  soft-tissue]
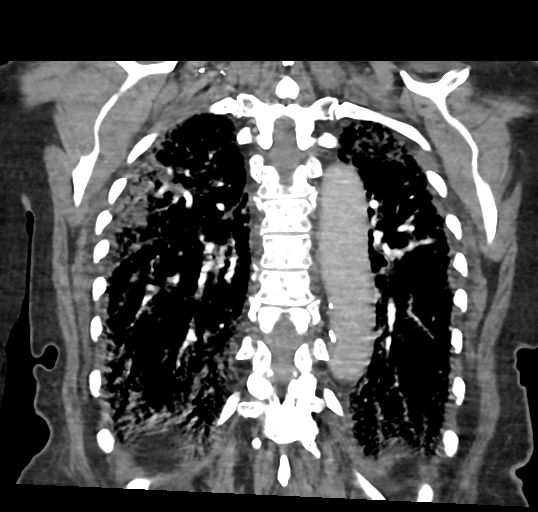

[18 of 46 positions shown; findings below may reference images not displayed]

FINDINGS: Cardiovascular: Evaluation is limited by respiratory motion
artifact. Within that limitation, there is no central pulmonary
embolus. The size of the main pulmonary artery is normal.
Cardiomegaly with coronary artery calcification. The course and
caliber of the aorta are normal. There is mild atherosclerotic
calcification. Opacification decreased due to pulmonary arterial
phase contrast bolus timing.

Mediastinum/Nodes:

--mild mediastinal adenopathy is noted.

--hilar adenopathy is noted.

-- No axillary lymphadenopathy.

--there is mild left supraclavicular adenopathy.

-- Normal thyroid gland where visualized.

-  Unremarkable esophagus.

Lungs/Pleura: Prominent interstitial lung markings and reticular
airspace opacities are noted. There are areas of ground-glass
opacification in the bilateral upper lobes, right worse than left.
There is bronchial wall thickening and mucus plugging bilaterally.
There is no pneumothorax. There is no large pleural effusion.

Upper Abdomen: Contrast bolus timing is not optimized for evaluation
of the abdominal organs. There is recanalization of the umbilical
vein in addition to splenomegaly, both consistent with cirrhosis
with stigmata of portal hypertension. There is a large hiatal hernia
with multiple esophageal varices.

Musculoskeletal: No chest wall abnormality. No bony spinal canal
stenosis.

Review of the MIP images confirms the above findings.
IMPRESSION: 1. Evaluation is limited by respiratory motion artifact. Within that
limitation, there is no central pulmonary embolus.
2. Prominent interstitial lung markings and reticular airspace
opacities, with areas of ground-glass opacification in the bilateral
upper lobes, right worse than left. Findings are nonspecific but can
be seen in patients with interstitial lung disease or pulmonary
edema. A superimposed infectious process is not excluded.
3. Bronchial wall thickening and mucus plugging bilaterally.
Findings can be seen in patients with reactive or infectious
bronchiolitis.
4. Cardiomegaly with coronary artery disease.
5. Cirrhosis with stigmata of portal hypertension including
splenomegaly and esophageal varices.
6. Large hiatal.

Aortic Atherosclerosis (QNOCF-ERC.C).
